# Patient Record
Sex: Female | Born: 1960 | Race: White | Hispanic: No | Marital: Married | Smoking: Never smoker
Health system: Southern US, Community
[De-identification: ages and names within clinical notes are randomized; demographics above are authoritative.]

## PROBLEM LIST (undated history)

## (undated) DIAGNOSIS — M1990 Unspecified osteoarthritis, unspecified site: Secondary | ICD-10-CM

## (undated) DIAGNOSIS — D649 Anemia, unspecified: Secondary | ICD-10-CM

## (undated) DIAGNOSIS — M1711 Unilateral primary osteoarthritis, right knee: Secondary | ICD-10-CM

## (undated) DIAGNOSIS — R000 Tachycardia, unspecified: Secondary | ICD-10-CM

## (undated) DIAGNOSIS — J45909 Unspecified asthma, uncomplicated: Secondary | ICD-10-CM

## (undated) DIAGNOSIS — E7800 Pure hypercholesterolemia, unspecified: Secondary | ICD-10-CM

## (undated) DIAGNOSIS — E119 Type 2 diabetes mellitus without complications: Secondary | ICD-10-CM

## (undated) DIAGNOSIS — M729 Fibroblastic disorder, unspecified: Secondary | ICD-10-CM

## (undated) DIAGNOSIS — F419 Anxiety disorder, unspecified: Secondary | ICD-10-CM

## (undated) DIAGNOSIS — I700 Atherosclerosis of aorta: Secondary | ICD-10-CM

## (undated) DIAGNOSIS — Z87442 Personal history of urinary calculi: Secondary | ICD-10-CM

## (undated) DIAGNOSIS — G43909 Migraine, unspecified, not intractable, without status migrainosus: Secondary | ICD-10-CM

## (undated) DIAGNOSIS — J189 Pneumonia, unspecified organism: Secondary | ICD-10-CM

## (undated) DIAGNOSIS — I10 Essential (primary) hypertension: Secondary | ICD-10-CM

## (undated) DIAGNOSIS — K219 Gastro-esophageal reflux disease without esophagitis: Secondary | ICD-10-CM

## (undated) HISTORY — DX: Fibroblastic disorder, unspecified: M72.9

---
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
## 2002-11-07 HISTORY — PX: CHOLECYSTECTOMY: SHX55

## 2008-11-07 HISTORY — PX: EXTRACORPOREAL SHOCK WAVE LITHOTRIPSY: SHX1557

## 2009-12-14 ENCOUNTER — Encounter (INDEPENDENT_AMBULATORY_CARE_PROVIDER_SITE_OTHER): Payer: Self-pay | Admitting: Emergency Medicine

## 2009-12-14 ENCOUNTER — Emergency Department (HOSPITAL_COMMUNITY): Admission: EM | Admit: 2009-12-14 | Discharge: 2009-12-14 | Payer: Self-pay | Admitting: Emergency Medicine

## 2009-12-14 ENCOUNTER — Ambulatory Visit: Payer: Self-pay | Admitting: Vascular Surgery

## 2010-10-25 ENCOUNTER — Ambulatory Visit: Payer: Self-pay | Admitting: Family Medicine

## 2010-11-11 ENCOUNTER — Ambulatory Visit: Payer: Self-pay | Admitting: Gastroenterology

## 2011-01-27 LAB — POCT I-STAT, CHEM 8
BUN: 13 mg/dL (ref 6–23)
Creatinine, Ser: 0.6 mg/dL (ref 0.4–1.2)
Glucose, Bld: 162 mg/dL — ABNORMAL HIGH (ref 70–99)
Hemoglobin: 12.9 g/dL (ref 12.0–15.0)
Potassium: 3.9 mEq/L (ref 3.5–5.1)
TCO2: 32 mmol/L (ref 0–100)

## 2011-08-06 ENCOUNTER — Ambulatory Visit: Payer: Self-pay | Admitting: Unknown Physician Specialty

## 2011-11-08 HISTORY — PX: JOINT REPLACEMENT: SHX530

## 2012-02-06 ENCOUNTER — Ambulatory Visit: Payer: Self-pay | Admitting: Unknown Physician Specialty

## 2012-05-20 DIAGNOSIS — E785 Hyperlipidemia, unspecified: Secondary | ICD-10-CM | POA: Insufficient documentation

## 2012-08-23 ENCOUNTER — Ambulatory Visit: Payer: Self-pay | Admitting: Cardiology

## 2012-09-04 ENCOUNTER — Ambulatory Visit: Payer: Self-pay | Admitting: Unknown Physician Specialty

## 2012-09-04 LAB — URINALYSIS, COMPLETE
Bacteria: NONE SEEN
Ketone: NEGATIVE
Leukocyte Esterase: NEGATIVE
Nitrite: NEGATIVE
Specific Gravity: 1.009 (ref 1.003–1.030)
WBC UR: <1 /HPF (ref 0–5)

## 2012-09-04 LAB — APTT: Activated PTT: 27.6 secs (ref 23.6–35.9)

## 2012-09-04 LAB — PROTIME-INR: Prothrombin Time: 13 secs (ref 11.5–14.7)

## 2012-09-10 ENCOUNTER — Inpatient Hospital Stay: Payer: Self-pay | Admitting: Unknown Physician Specialty

## 2012-09-11 DIAGNOSIS — R000 Tachycardia, unspecified: Secondary | ICD-10-CM

## 2012-09-11 LAB — BASIC METABOLIC PANEL
Co2: 24 mmol/L (ref 21–32)
Creatinine: 0.67 mg/dL (ref 0.60–1.30)
EGFR (African American): >60
Potassium: 4.1 mmol/L (ref 3.5–5.1)
Sodium: 135 mmol/L — ABNORMAL LOW (ref 136–145)

## 2012-09-11 LAB — ELECTROLYTE PANEL
Anion Gap: 8 (ref 7–16)
Chloride: 101 mmol/L (ref 98–107)
Potassium: 4.1 mmol/L (ref 3.5–5.1)

## 2012-09-11 LAB — HEMOGLOBIN: HGB: 8.9 g/dL — ABNORMAL LOW (ref 12.0–16.0)

## 2012-09-12 LAB — TSH: Thyroid Stimulating Horm: 0.52 u[IU]/mL

## 2012-09-12 LAB — BASIC METABOLIC PANEL
BUN: 6 mg/dL — ABNORMAL LOW (ref 7–18)
Co2: 27 mmol/L (ref 21–32)
Creatinine: 0.59 mg/dL — ABNORMAL LOW (ref 0.60–1.30)
Osmolality: 271 (ref 275–301)
Sodium: 135 mmol/L — ABNORMAL LOW (ref 136–145)

## 2012-09-12 LAB — HEMOGLOBIN: HGB: 9.4 g/dL — ABNORMAL LOW (ref 12.0–16.0)

## 2012-09-12 LAB — MAGNESIUM: Magnesium: 1.9 mg/dL

## 2012-10-25 DIAGNOSIS — G2581 Restless legs syndrome: Secondary | ICD-10-CM | POA: Insufficient documentation

## 2013-04-02 ENCOUNTER — Ambulatory Visit: Payer: Self-pay | Admitting: Family Medicine

## 2013-11-21 ENCOUNTER — Ambulatory Visit: Payer: Self-pay | Admitting: Gastroenterology

## 2013-11-21 LAB — COMPREHENSIVE METABOLIC PANEL
ANION GAP: 5 — AB (ref 7–16)
AST: 32 U/L (ref 15–37)
Albumin: 3.8 g/dL (ref 3.4–5.0)
Alkaline Phosphatase: 110 U/L
BUN: 11 mg/dL (ref 7–18)
Bilirubin,Total: 0.3 mg/dL (ref 0.2–1.0)
CALCIUM: 9.9 mg/dL (ref 8.5–10.1)
CHLORIDE: 105 mmol/L (ref 98–107)
CO2: 26 mmol/L (ref 21–32)
CREATININE: 0.91 mg/dL (ref 0.60–1.30)
EGFR (African American): >60
Glucose: 145 mg/dL — ABNORMAL HIGH (ref 65–99)
Osmolality: 274 (ref 275–301)
Potassium: 3.6 mmol/L (ref 3.5–5.1)
SGPT (ALT): 45 U/L (ref 12–78)
Sodium: 136 mmol/L (ref 136–145)
Total Protein: 7.8 g/dL (ref 6.4–8.2)

## 2013-11-21 LAB — CBC WITH DIFFERENTIAL/PLATELET
BASOS ABS: 0.1 10*3/uL (ref 0.0–0.1)
BASOS PCT: 1 %
EOS ABS: 0.3 10*3/uL (ref 0.0–0.7)
Eosinophil %: 2.5 %
HCT: 44.1 % (ref 35.0–47.0)
HGB: 14.6 g/dL (ref 12.0–16.0)
LYMPHS PCT: 27.3 %
Lymphocyte #: 3.1 10*3/uL (ref 1.0–3.6)
MCH: 27.2 pg (ref 26.0–34.0)
MCHC: 33.2 g/dL (ref 32.0–36.0)
MCV: 82 fL (ref 80–100)
MONO ABS: 0.7 x10 3/mm (ref 0.2–0.9)
MONOS PCT: 6.4 %
NEUTROS ABS: 7.2 10*3/uL — AB (ref 1.4–6.5)
NEUTROS PCT: 62.8 %
Platelet: 411 10*3/uL (ref 150–440)
RBC: 5.37 10*6/uL — AB (ref 3.80–5.20)
RDW: 13.8 % (ref 11.5–14.5)
WBC: 11.5 10*3/uL — AB (ref 3.6–11.0)

## 2013-11-21 LAB — TROPONIN I: Troponin-I: <0.02 ng/mL

## 2014-06-03 ENCOUNTER — Ambulatory Visit: Payer: Self-pay | Admitting: Nurse Practitioner

## 2015-02-19 DIAGNOSIS — M1711 Unilateral primary osteoarthritis, right knee: Secondary | ICD-10-CM | POA: Insufficient documentation

## 2015-02-24 NOTE — Op Note (Signed)
PATIENT NAME:  Andrea Zhang, Andrea Zhang MR#:  [id number] DATE OF BIRTH:  [birth year]  DATE OF PROCEDURE:  09/10/2012  PREOPERATIVE DIAGNOSIS: Severe degenerative arthritis, left knee.   POSTOPERATIVE DIAGNOSIS: Severe degenerative arthritis, left knee.   OPERATION: [employer] PS total knee replacement on the left.   SURGEON: Alda Berthold., MD   FIRST ASSISTANT: Glenford Bayley, nurse practitioner   ANESTHESIA: General.   HISTORY: The patient had a long history of symptomatic degenerative arthritis of her left knee. She did have multiple weightbearing views plus MRI to determine if her lateral compartment was as involved as her medial. Indeed it was felt that the patient had tricompartmental pathology and was not a candidate for a unicondylar knee but rather a candidate for total knee replacement. Ultimately she was brought in for surgery because of her refractoriness to conservative treatment.   OPERATIVE NOTE: The patient was taken to the operating room where satisfactory general anesthesia was achieved. A tourniquet was applied to the patient's left upper thigh and the left lower extremity was prepped and draped in the usual fashion for a procedure about the knee. The patient incidentally was given 1 gram of vancomycin IV prior to the start of the procedure. The left lower extremity was exsanguinated and the tourniquet was inflated. A slightly curved longitudinal incision was made over the anterior aspect of the left knee joint. Dissection was carried down through the subcutaneous tissue onto the extensor mechanism. Bleeding was controlled with coagulation cautery. The vastus medialis was divided in line with its fibers about a fingerbreadth above the superomedial pole of the patella. The joint was entered. Dissection was carried along the medial border of the patella and patellar tendon. The patella was everted and dislocated laterally as the knee was flexed.   Inspection of the joint revealed the  patient had osteophytes about the periphery of the distal femur and in the intercondylar notch. There were some small osteophytes about the periphery of the tibial plateau.   I went ahead and excised the osteophytes from the periphery of the distal femur and tibia and from the intercondylar notch. A 3/8-inch hole was made in the distal femur about a centimeter anterior to the PCL insertion on the distal femur. Intramedullary rod was inserted. On the distal end of the rod was an alignment jig and cutting block. After the alignment jig was appropriately positioned, the cutting block was fixed to the distal femur with smooth pins. The intramedullary rod was removed and then a distal femoral cut was made. I then templated the distal femur for a #7 femoral component.   Starting holes for the femoral cut were made with a #8 block so as not to notch the anterior aspect of the distal femur. The holes were placed in slight external rotation. The #7 cutting block was then impacted into the holes and then our anterior and posterior cuts were made along with our anterior and posterior chamfer cuts. The intercondylar area was notched out using the appropriate jig. The ACL and PCL were removed at this time. The knee was hyperflexed. Meniscal remnants were excised. A 3/8-inch intramedullary hole was made in the midline of the proximal tibial junction in the anterior one-third and posterior two thirds. Intramedullary rod was inserted. On the proximal end of the rod was a 0 degree cutting block. It was positioned for a cut 2 mm below the lowest portion of the medial tibial plateau. The cutting block was aligned and affixed to the proximal tibia with  smooth pins and then the intramedullary rod and stylus were removed. Proximal tibia cut was made without difficulty.   I templated the proximal tibia for a #6 tibial baseplate.   The trials were inserted. We used a baseplate with a 10 mm spacer and a #7 femoral component. With  the components in place, the knee moved well and was quite stable. I was able to achieve full extension of the left knee.   At this time the tourniquet was released. It had been up about 70 minutes. While the tourniquet was down, I went ahead and made the retropatellar cut. I removed about 8 mm of bone from the retropatellar surface. I templated the retropatellar surface for a #7 10-mm thick resurfacing patellar component.   With the trial in place, the patella seemed to track well.   The left lower extremity was re-exsanguinated and the tourniquet was reinflated. The tourniquet was down about 12 to 13 minutes prior to reinflation. I went ahead and removed the trial components. I did use the tibial baseplate as a guide for our cruciate cut in the proximal tibia. The cruciate cut was made without difficulty. Jet lavage was then used to remove blood from the cut cancellous bone.    Next, I went ahead and impacted the primary components. The #6 standard tibial tray was impacted onto the proximal tibia with methylmethacrylate. Excess glue was removed. The #7 PS femoral component was impacted onto the distal femur with methylmethacrylate and again excess glue was removed.   A 10 mm #5 trial tibial bearing insert was impacted on the tibial tray and then the knee was extended. The #7 10-mm thick resurfacing patellar component was glued to the retropatellar surface. Once again excess glue was removed.   After the glue had set up, I went ahead and removed the trial 10 mm tibial bearing insert and impacted the permanent #5 10-mm posterior stabilized tibial bearing insert onto the tibial tray.   After the glue had set up, the tourniquet was released. Total tourniquet time was 122 minutes.   Again, bleeding was controlled with coagulation cautery. Two Autovac tubes were inserted into the depths of the wound.   I then infiltrated the wound with 30 mL of 0.5% Marcaine without epinephrine and 50 mL of diluted  Exparel, a  long-acting anesthetic.    I then closed the capsule with #1 Ethibond and #1 Vicryl sutures. Sub-Q was closed with 2-0 Vicryl and the skin with skin staples. Four TENS pads were placed about the knee followed by a sterile dressing. Polar Care cooling pad was applied around the left knee followed by a knee immobilizer.   The patient was then awakened and transferred to her hospital bed.   She was taken to the recovery room in satisfactory condition. Blood loss was probably less than 50 mL.   SUMMARY OF COMPONENTS USED: #7 PS femoral component, #6 standard tibial tray, #5 10-mm thick PS tibial bearing insert, and a #7 10-mm thick resurfacing patellar component.   We did use methylmethacrylate impregnated with tobramycin.  ____________________________ Alda Berthold., MD hbk:drc D: 09/10/2012 15:01:53 ET T: 09/10/2012 17:46:00 ET JOB#: [id number] Alda Berthold MD ELECTRONICALLY SIGNED 09/12/2012 11:06

## 2015-02-24 NOTE — Consult Note (Signed)
PATIENT NAME:  Andrea Zhang, Andrea Zhang MR#:  [id number] DATE OF BIRTH:  05/03/1961  DATE OF CONSULTATION:  09/11/2012  REFERRING PHYSICIAN:  Erin Sons, Montez Hageman., MD  CONSULTING PHYSICIAN:  Blaike Newburn H. Allena Katz, MD  PRIMARY CARE PROVIDER: Forest Gleason, MD    REASON FOR CONSULTATION: Tachycardia.   HISTORY OF PRESENT ILLNESS: The patient is a 54 year old white female who underwent a left total knee replacement on November 4th. The patient was noted to have elevated heart rate which started on November 4th a few hours after surgery. The patient reports that she is having still a lot of pain in her knee and feels likely cause of her symptoms. Her hemoglobin was 8.9 when checked earlier. She otherwise denies any chest pain, shortness of breath, or palpitations. She was able to ambulate without any dizziness or syncope. The patient reports that she did have a recent cardiac cath after having a stress test which just showed some mild plaque and irregularities. No significant other abnormalities were noted. The patient otherwise is doing well and denies any fevers or chills.   1. PAST MEDICAL HISTORY:  2. 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  3. Osteoarthritis.  4. Diabetes, type II.  5. History of elevated LFTs.  6. History of diverticulitis and diverticulosis. 7. History of rhinitis.  8. History of polio as a child.  9. Hypertension.  10. Hyperlipidemia.  11. History of migraines.   PAST SURGICAL HISTORY:  1. Status post lithotripsy for kidney stones.  2. Status post left total knee replacement.  3. Status post tonsillectomy.  4. Status post cholecystectomy.  5. Status post left knee meniscus repair.  6. History of Baker's cyst repair surgery.  7. Bilateral tubal ligation.   ALLERGIES: Cephalexin, contrast dye, diclofenac, Maxalt, Zithromax, and Zomig.   HOME MEDICATIONS:  1. Advair 250/50 one inhalation b.i.d.  2. Aspirin 81 one tab p.o. daily.  3. Astepro 205.5 mcg  inhalation two sprays each nostril daily.  4. Benicar 20 daily.  5. Mirtazapine 30 at bedtime.  6. Simvastatin 40 one tab p.o. b.i.d.  7. Promethazine 25 q.6 p.r.n.  8. ProAir 2 puffs q.6 p.r.n.  9. Singulair 10 daily.  10. Sumatriptan 100 one tab p.o. b.i.d. as needed.  11. Xyzal 5 mg daily in the evening.   SOCIAL HISTORY: Does not smoke. Does not drink. No drugs.   FAMILY HISTORY: Positive for hypertension.  REVIEW OF SYSTEMS: CONSTITUTIONAL: Denies any fevers, chills. No weight gain. No weight loss. HEENT: Denies any visual difficulty. No blurred vision. No cataracts. No glaucoma. No difficulty with swelling. Denies any epistaxis. Does have chronic rhinitis. Denies any difficulty with hearing. CARDIOVASCULAR: Denies any chest pains or palpitations. No syncope. Recent cardiac cath according to her showed just some plaque but no significant coronary artery disease. PULMONARY: Denies any wheezing. No coughing. No pneumonia. No TB. GI: Complains of some nausea but no vomiting. No diarrhea. No hematemesis. No hematochezia. GU: Denies any dysuria, frequency, urgency, or hesitancy. NEUROLOGIC: Denies any numbness, CVA, TIA, or seizures. PSYCHIATRIC: No anxiety. HEME: Denies any easy bruisability.   PHYSICAL EXAMINATION:   VITAL SIGNS: Temperature 98.6, pulse 118, respirations 18, blood pressure 111/78, O2 96%.   GENERAL: The patient is an obese female currently not in any acute distress.   HEENT: Head atraumatic, normocephalic. Pupils equally round, reactive to light and accommodation. There is no conjunctival pallor. No scleral icterus. There is no nasal congestion. No drainage. Oropharynx is clear without any exudate.  NECK: No thyromegaly. No carotid bruits.   CARDIOVASCULAR: Tachycardic. No murmurs, rubs, clicks, or gallops.   LUNGS: Clear to auscultation bilaterally without any rales, rhonchi, or wheezing.   ABDOMEN: Soft, nontender, nondistended. Positive bowel sounds x4.    EXTREMITIES: There is no clubbing, cyanosis, or edema.   LYMPHATICS: No lymph nodes palpable.   SKIN: No rash.   VASCULAR: Good DP, PT pulses.   PSYCHIATRIC: Not anxious or depressed.   NEUROLOGICAL: Awake, alert, oriented x3. No focal deficits.   EVALUATIONS: Glucose 162, BUN 5, creatinine 0.67, sodium 135, potassium 4.1, chloride 102, CO2 24. Hemoglobin was 8.9 this morning.   ASSESSMENT AND PLAN: The patient is a 54 year old white female with diabetes, hypertension, hyperlipidemia, recent cardiac cath which was negative, status post left total knee replacement. The patient noted to have elevated heart rate since yesterday. The patient denies any chest pain, shortness of breath, or palpitations.  1. Likely sinus tachycardia, likely cause is due to pain. At this time I will go ahead and check an EKG. Place her on telemetry. Her hemoglobin is okay. Electrolytes look all okay. At this time will try to control her pain. Will place her on low dose metoprolol withholding parameters. Will also check a TSH and a magnesium level.  2. Hypertension. Hold Benicar. Will give instead metoprolol b.i.d.  3. Diabetes. Continue sliding scale.  4. Chronic rhinitis. Continue cetirizine. The patient is also on Astepro which will go ahead and resume.  5. Miscellaneous. Recommend incentive spirometry and DVT prophylaxis.   TIME SPENT: 35 minutes.   ____________________________ Lacie Scotts Allena Katz, MD shp:drc D: 09/11/2012 15:27:15 ET T: 09/11/2012 17:12:54 ET JOB#: [id number]  cc: Lavena Loretto H. Allena Katz, MD, <Dictator> Santiago Bur. Shelva Majestic, MD Charise Carwin MD ELECTRONICALLY SIGNED 09/11/2012 17:30

## 2015-02-24 NOTE — Discharge Summary (Signed)
PATIENT NAME:  Andrea Zhang, Andrea Zhang MR#:  [id number] DATE OF BIRTH:  December 15, 1960  DATE OF ADMISSION:  09/10/2012 DATE OF DISCHARGE:  09/13/2012  ADMITTING DIAGNOSIS:  Left knee osteoarthritis.  DISCHARGE DIAGNOSIS:  Left knee osteoarthritis.  OPERATION:  On 09/11/2012, left total knee arthroplasty.  SURGEON:  Dr. Elfredia Nevins, Montez Hageman.  ANESTHESIA:  General.  ESTIMATED BLOOD LOSS: 25 mL.  DRAINS:  Autovac.  COMPLICATIONS:  None.  IMPLANTS:  #7 PDS femoral component, #6 standard tibial tray, #5 10-ml thick PS tibial bearing insert, #7 10-mL thick resurfacing patellar component.  The patient was stabilized and brought to the recovery room and then brought to the orthopedic floor where she was treated with physical therapy and for pain control.  HISTORY:  Andrea Zhang is a 54 year old white female with arthritis of the left knee and has agreed to proceed with total knee.  She had previous steroid injection and Synvisc, neither of which were helpful.  She had a previous left knee arthroscopy and has continued to have pain.  PHYSICAL EXAMINATION:  Well developed, well nourished and obese female.  No acute distress. HEART: Regular rate and rhythm.  LUNGS: Clear to auscultation throughout bilaterally.  MUSCULOSKELETAL:  Preoperative left knee range of motion 0-120, painful flexion.  Slightly antalgic gait.  No gross deformity.  Tender to palpation on the medial and lateral joint lines.  Ligamentous exam is stable.  Negative McMurray.  SKIN: Incision consistent with total knee arthroscopy.  Has staples intact and dry dressing with a scant amount of serosanguineous drainage.    HOSPITAL COURSE:  After admission on 09/11/2012, she had surgery on the same day.  She had good pain control afterwards and was brought to the orthopedic floor from the recovery room.  On postoperative day one she complained of itching.  She was also tachycardic, 118-120. Medicine was consulted and she was placed on telemetry with the  recommendation of holding her Benicar and starting her on metoprolol.  She had a hemoglobin of 8.9.  She ambulated 80 feet with physical therapy that day. On postoperative day two, 09/12/2012, her hemoglobin was 9.4.  Her Foley and Hemovac were discontinued and her dressing was changed.  She continued to progress with physical therapy.  On postoperative day three, 09/13/2012, she ambulated with physical therapy 235 feet and was on telemetry.  She had a bowel movement before being discharged and was discharged home with Home Health.  CONDITION ON DISCHARGE:  Stable.  DISPOSITION:  She was sent home with Home Health.  DISCHARGE INSTRUCTIONS:   She will follow up with [facility] orthopedics in two weeks for staple removal.  She will have physical therapy at home and will be weightbearing as tolerated in the left lower extremity.  TED hose will be used knee-high bilaterally.  Dressing can be changed daily on an as-needed basis.  DISCHARGE MEDICATIONS:  She will resume her home medications: 1. Singulair 10 mg, 1 tab p.o. daily in the evening. 2. Mirtazapine 30 mg, 1 tab p.o. once daily at bedtime. 3. Aspirin 81 mg, 1 tab p.o. q. a.m. 4. Astepro 200 5.5 mcg per inhalation (0.15%) nasal spray, 2 sprays each nostril once daily q. a.m. 5. Benicar 20 mg 1 tab p.o. q. a.m. 6. Horizant 600 mg p.o. extended release, 1 tab p.o. at bedtime. 7. Nexium 40 mg delayed release capsule, 1 cap b.i.d. 8. ProAir HFA 2 puffs inhaled q. 6 hours p.r.n. 9. Sumatriptan 100 mg, 1 tab p.o. b.i.d., p.r.n. headache. 10. Promethazine 25 mg,  1 tab p.o. q. 6 hours p.r.n. headache, nausea, or vomiting. 11. Xyzal 5 mg, 1 tab p.o. at bedtime. 12. Advair Diskus 250 mcg/50 mcg inhalation powder, 1 puff b.i.d.   13. Additional medicines include Lovenox 50 mg subq daily times 14 days.    ____________________________ Niyonna Betsill M. Haskel Khan, NP amb:bjt D: 09/19/2012 14:00:08 ET T: 09/19/2012 14:26:26 ET JOB#: [id number]  cc: Tylah Mancillas M.  Haskel Khan, NP, <Dictator> Burt Ek Faelynn Wynder FNP ELECTRONICALLY SIGNED 09/27/2012 7:11

## 2015-03-01 NOTE — Op Note (Signed)
PATIENT NAME:  Andrea Zhang, Andrea Zhang MR#:  [id number] DATE OF BIRTH:  09/01/1961  DATE OF PROCEDURE:  02/06/2012  PREOPERATIVE DIAGNOSIS: Degenerative arthritis, left knee, primarily involving the medial compartment plus remote ACL tear.   POSTOPERATIVE DIAGNOSIS: Degenerative arthritis, left knee, with significant patellofemoral medial compartment involvement and mild lateral compartment involvement plus remote ACL tear.  OPERATION: Arthroscopic debridement and coblation of multiple chondral lesions.   SURGEON: Alda BertholdHarold B. Judson Tsan, Jr., MD    ANESTHESIA: General.   HISTORY: The patient had a long history of left knee pain. X-rays revealed collapse of medial compartment. She was noted to have some osteophytes about the lateral aspect of her left knee and there were some patellofemoral changes. MRI was consistent with the above plus mild arthritis of the lateral compartment along with absent ACL.   The patient was ultimately brought in for arthroscopy of her left knee because of persistent symptoms.  PROCEDURE: The patient was taken to the operating room where satisfactory general anesthesia was achieved. A tourniquet was applied to her left thigh followed by legholder. The right lower extremity was supported with a well legholder. The left knee was prepped and draped in the usual fashion for an arthroscopic procedure. An Inflow cannula was introduced superomedially. The joint was distended with lactated Ringer's. We used a Mitek fluid pump to facilitate joint distention.  Scope was introduced through an inferolateral puncture wound and a probe in an inferomedial puncture wound. Inspection of the medial compartment revealed the patient had eroded the medial aspect of the medial tibial plateau to bone. There was a corresponding grade IV chondral lesion in the medial aspect of the medial femoral condyle. The patient had had a remote partial medial meniscectomy.   Inspection of the intercondylar notch  revealed a notch that was filled with fibrous tissue. I could not find a distinct ACL or PCL. There were osteophytes in the intercondylar area.  Inspection of the lateral compartment revealed the patient had fraying of the articular surface of the medial aspect of the medial femoral condyle and medial aspect of the tibial plateau. There was some mild central wear of the more anterior aspect of the lateral femoral condyle.   The lateral meniscus was frayed on its inner border. I went ahead and debrided a frayed portion with  a turbo whisker. I then inspected the patellofemoral area. Indeed the patient had grade III chondral changes in her trochlear groove and retropatellar surface. I debrided these areas with a turbo whisker and then Coblated the lesions with an [employer] paragon wand.   I did observe patella tracking along the superomedial portion and indeed it seemed to track normally.   I decompressed the fluid from the joint at this time and removed the cannulas. The puncture wounds were closed with 3-0 nylon in vertical mattress fashion. Several mL of 0.5% Marcaine without epinephrine was injected about each puncture wound and then Betadine was applied to the wounds followed by a sterile dressing.   An ice pack was applied around the patient's left knee and then she was awakened and transferred to her stretcher bed. She was taken to the recovery room in satisfactory condition. Blood loss was negligible.   Incidentally, the tourniquet was not inflated during the course of the procedure.   ____________________________ Alda BertholdHarold B. Darren Caldron Jr., MD hbk:drc D: 02/06/2012 10:14:48 ET T: 02/06/2012 11:38:48 ET JOB#: [id number]  cc: Alda BertholdHarold B. Sumi Lye Jr., MD, <Dictator> Randon GoldsmithHAROLD B Kavari Parrillo, JR MD ELECTRONICALLY SIGNED 02/26/2012 21:29

## 2015-06-30 ENCOUNTER — Other Ambulatory Visit: Payer: Self-pay | Admitting: Nurse Practitioner

## 2015-06-30 DIAGNOSIS — Z1231 Encounter for screening mammogram for malignant neoplasm of breast: Secondary | ICD-10-CM

## 2015-08-12 ENCOUNTER — Ambulatory Visit
Admission: RE | Admit: 2015-08-12 | Discharge: 2015-08-12 | Disposition: A | Discharge status: Home / Self Care | Payer: BLUE CROSS/BLUE SHIELD | Source: Ambulatory Visit | Attending: Nurse Practitioner | Admitting: Nurse Practitioner

## 2015-08-12 DIAGNOSIS — Z1231 Encounter for screening mammogram for malignant neoplasm of breast: Secondary | ICD-10-CM | POA: Insufficient documentation

## 2015-12-15 ENCOUNTER — Other Ambulatory Visit: Payer: Self-pay | Admitting: Rheumatology

## 2015-12-15 DIAGNOSIS — R7989 Other specified abnormal findings of blood chemistry: Secondary | ICD-10-CM

## 2015-12-15 DIAGNOSIS — R945 Abnormal results of liver function studies: Principal | ICD-10-CM

## 2015-12-18 ENCOUNTER — Ambulatory Visit: Payer: BLUE CROSS/BLUE SHIELD

## 2015-12-21 ENCOUNTER — Ambulatory Visit: Payer: BLUE CROSS/BLUE SHIELD

## 2015-12-24 ENCOUNTER — Ambulatory Visit
Admission: RE | Admit: 2015-12-24 | Discharge: 2015-12-24 | Disposition: A | Discharge status: Home / Self Care | Payer: BLUE CROSS/BLUE SHIELD | Source: Ambulatory Visit | Attending: Rheumatology | Admitting: Rheumatology

## 2015-12-24 DIAGNOSIS — Z9049 Acquired absence of other specified parts of digestive tract: Secondary | ICD-10-CM | POA: Insufficient documentation

## 2015-12-24 DIAGNOSIS — R945 Abnormal results of liver function studies: Secondary | ICD-10-CM | POA: Insufficient documentation

## 2015-12-24 DIAGNOSIS — K760 Fatty (change of) liver, not elsewhere classified: Secondary | ICD-10-CM | POA: Insufficient documentation

## 2015-12-24 DIAGNOSIS — R7989 Other specified abnormal findings of blood chemistry: Secondary | ICD-10-CM

## 2016-05-16 ENCOUNTER — Emergency Department
Admission: EM | Admit: 2016-05-16 | Discharge: 2016-05-16 | Disposition: A | Discharge status: Home / Self Care | Payer: BLUE CROSS/BLUE SHIELD | Attending: Emergency Medicine | Admitting: Emergency Medicine

## 2016-05-16 ENCOUNTER — Encounter: Payer: Self-pay | Admitting: Emergency Medicine

## 2016-05-16 DIAGNOSIS — E119 Type 2 diabetes mellitus without complications: Secondary | ICD-10-CM | POA: Diagnosis not present

## 2016-05-16 DIAGNOSIS — T18120A Food in esophagus causing compression of trachea, initial encounter: Secondary | ICD-10-CM | POA: Diagnosis not present

## 2016-05-16 DIAGNOSIS — Y929 Unspecified place or not applicable: Secondary | ICD-10-CM | POA: Diagnosis not present

## 2016-05-16 DIAGNOSIS — J45909 Unspecified asthma, uncomplicated: Secondary | ICD-10-CM | POA: Diagnosis not present

## 2016-05-16 DIAGNOSIS — I10 Essential (primary) hypertension: Secondary | ICD-10-CM | POA: Insufficient documentation

## 2016-05-16 DIAGNOSIS — M1990 Unspecified osteoarthritis, unspecified site: Secondary | ICD-10-CM | POA: Insufficient documentation

## 2016-05-16 DIAGNOSIS — W458XXA Other foreign body or object entering through skin, initial encounter: Secondary | ICD-10-CM | POA: Diagnosis not present

## 2016-05-16 DIAGNOSIS — Y939 Activity, unspecified: Secondary | ICD-10-CM | POA: Insufficient documentation

## 2016-05-16 DIAGNOSIS — T18128A Food in esophagus causing other injury, initial encounter: Secondary | ICD-10-CM

## 2016-05-16 DIAGNOSIS — Y999 Unspecified external cause status: Secondary | ICD-10-CM | POA: Diagnosis not present

## 2016-05-16 DIAGNOSIS — K222 Esophageal obstruction: Secondary | ICD-10-CM

## 2016-05-16 HISTORY — DX: Unspecified asthma, uncomplicated: J45.909

## 2016-05-16 HISTORY — DX: Migraine, unspecified, not intractable, without status migrainosus: G43.909

## 2016-05-16 HISTORY — DX: Type 2 diabetes mellitus without complications: E11.9

## 2016-05-16 HISTORY — DX: Unspecified osteoarthritis, unspecified site: M19.90

## 2016-05-16 HISTORY — DX: Gastro-esophageal reflux disease without esophagitis: K21.9

## 2016-05-16 HISTORY — DX: Essential (primary) hypertension: I10

## 2016-05-16 NOTE — ED Notes (Signed)
States approx 30 minutes ago got something stuck when swallowed food. Thinks it is a piece of beef. Speaking clearly. Unable to swallow saliva.

## 2016-05-16 NOTE — ED Provider Notes (Signed)
[facility] Emergency Department Provider Note  Time seen: 6:39 PM  I have reviewed the triage vital signs and the nursing notes.   HISTORY  Chief Complaint Foreign Body    HPI Andrea Zhang is a 55 y.o. female with a past medical history of asthma, diabetes, hypertension, gastric reflux who presents to the emergency department feeling that she has food stuck in her esophagus. According to the patient she has had had food removed from her esophagus approximate 5 or 6 times over the past 10 years. States last time she had an esophageal dilation was approximately 2 or 3 years ago. Patient states she was eating a steak sandwich tonight when she felt like something got stuck in her esophagus. She states since that time she has been unable to swallow her saliva. She states this feels like prior food bolus impactions.Patient is speaking without any difficulty, no distress.     Past Medical History  Diagnosis Date  . Arthritis   . Asthma   . Diabetes mellitus without complication (HCC)   . Hypertension   . GERD (gastroesophageal reflux disease)   . Migraines     There are no active problems to display for this patient.   Past Surgical History  Procedure Laterality Date  . Cholecystectomy      No current outpatient prescriptions on file.  Allergies Diclofenac and Other  No family history on file.  Social History Social History  Substance Use Topics  . Smoking status: None  . Smokeless tobacco: None  . Alcohol Use: None    Review of Systems Constitutional: Negative for fever Cardiovascular: Negative for chest pain. Respiratory: Negative for shortness of breath. Gastrointestinal: Negative for abdominal pain Musculoskeletal: Negative for back pain. Neurological: Negative for headache 10-point ROS otherwise negative.  ____________________________________________   PHYSICAL EXAM:  VITAL SIGNS: ED Triage Vitals  Enc Vitals Group     BP  05/16/16 1816 146/101 mmHg     Pulse Rate 05/16/16 1816 90     Resp 05/16/16 1816 18     Temp 05/16/16 1816 98.3 F (36.8 C)     Temp Source 05/16/16 1816 Oral     SpO2 05/16/16 1816 94 %     Weight 05/16/16 1816 200 lb (90.719 kg)     Height 05/16/16 1816 5\' 1"  (1.549 m)     Head Cir --      Peak Flow --      Pain Score 05/16/16 1818 8     Pain Loc --      Pain Edu? --      Excl. in GC? --     Constitutional: Alert and oriented. Well appearing and in no distress. Eyes: Normal exam ENT   Head: Normocephalic and atraumatic.   Mouth/Throat: Mucous membranes are moist. Cardiovascular: Normal rate, regular rhythm. No murmur Respiratory: Normal respiratory effort without tachypnea nor retractions. Breath sounds are clear Gastrointestinal: Soft and nontender. No distention. Musculoskeletal: Ambulates without difficulty. Neurologic:  Normal speech and language. No gross focal neurologic deficits  Skin:  Skin is warm, dry and intact.  Psychiatric: Mood and affect are normal.  ____________________________________________   INITIAL IMPRESSION / ASSESSMENT AND PLAN / ED COURSE  Pertinent labs & imaging results that were available during my care of the patient were reviewed by me and considered in my medical decision making (see chart for details).  The patient presents the emergency department with a possible food bolus impaction. Patient states she is eating a steak  sandwich approximate 5:45 PM when she felt the food gets stuck. She has not been able to swallow secretions since that time. However while waiting in the emergency department the patient vomited, now states that she feels like she is able to swallow her secretions. We will provide the patient with a carbonated beverage, if she is able to swallow we will have her follow-up with Dr. Mechele Collin as an outpatient. If not will have Dr. Mechele Collin see patient in the emergency department.  Dr. Mechele Collin is in the emergency department  currently seeing the patient, poorly the patient had called Dr. Mechele Collin before arriving to the emergency department and he was aware of the patient's arrival.  Patient is able to swallow, no beverage in the emergency department. Patient will follow up with Dr. Mechele Collin in his office tomorrow. Patient agreeable to plan.  ____________________________________________   FINAL CLINICAL IMPRESSION(S) / ED DIAGNOSES  Esophageal food bolus impaction   Minna Antis, MD 05/16/16 [phone]

## 2016-05-16 NOTE — Discharge Instructions (Signed)
Esophagogastroduodenoscopy °Esophagogastroduodenoscopy (EGD) is a procedure that is used to examine the lining of the esophagus, stomach, and first part of the small intestine (duodenum). A long, flexible, lighted tube with a camera attached (endoscope) is inserted down the throat to view these organs. This procedure is done to detect problems or abnormalities, such as inflammation, bleeding, ulcers, or growths, in order to treat them. The procedure lasts 5-20 minutes. It is usually an outpatient procedure, but it may need to be performed in a hospital in emergency cases. °LET YOUR HEALTH CARE PROVIDER KNOW ABOUT: °· Any allergies you have. °· All medicines you are taking, including vitamins, herbs, eye drops, creams, and over-the-counter medicines. °· Previous problems you or members of your family have had with the use of anesthetics. °· Any blood disorders you have. °· Previous surgeries you have had. °· Medical conditions you have. °RISKS AND COMPLICATIONS °Generally, this is a safe procedure. However, problems can occur and include: °· Infection. °· Bleeding. °· Tearing (perforation) of the esophagus, stomach, or duodenum. °· Difficulty breathing or not being able to breathe. °· Excessive sweating. °· Spasms of the larynx. °· Slowed heartbeat. °· Low blood pressure. °BEFORE THE PROCEDURE °· Do not eat or drink anything after midnight on the night before the procedure or as directed by your health care provider. °· Do not take your regular medicines before the procedure if your health care provider asks you not to. Ask your health care provider about changing or stopping those medicines. °· If you wear dentures, be prepared to remove them before the procedure. °· Arrange for someone to drive you home after the procedure. °PROCEDURE °· A numbing medicine (local anesthetic) may be sprayed in your throat for comfort and to stop you from gagging or coughing. °· You will have an IV tube inserted in a vein in your  hand or arm. You will receive medicines and fluids through this tube. °· You will be given a medicine to relax you (sedative). °· A pain reliever will be given through the IV tube. °· A mouth guard may be placed in your mouth to protect your teeth and to keep you from biting on the endoscope. °· You will be asked to lie on your left side. °· The endoscope will be inserted down your throat and into your esophagus, stomach, and duodenum. °· Air will be put through the endoscope to allow your health care provider to clearly view the lining of your esophagus. °· The lining of your esophagus, stomach, and duodenum will be examined. During the exam, your health care provider may: °¨ Remove tissue to be examined under a microscope (biopsy) for inflammation, infection, or other medical problems. °¨ Remove growths. °¨ Remove objects (foreign bodies) that are stuck. °¨ Treat any bleeding with medicines or other devices that stop tissues from bleeding (hot cautery, clipping devices). °¨ Widen (dilate) or stretch narrowed areas of your esophagus and stomach. °· The endoscope will be withdrawn. °AFTER THE PROCEDURE °· You will be taken to a recovery area for observation. Your blood pressure, heart rate, breathing rate, and blood oxygen level will be monitored often until the medicines you were given have worn off. °· Do not eat or drink anything until the numbing medicine has worn off and your gag reflex has returned. You may choke. °· Your health care provider should be able to discuss his or her findings with you. It will take longer to discuss the test results if any biopsies were taken. °  °  This information is not intended to replace advice given to you by your health care provider. Make sure you discuss any questions you have with your health care provider. °  °Document Released: 02/24/2005 Document Revised: 11/14/2014 Document Reviewed: 09/26/2012 °Elsevier Interactive Patient Education ©2016 [employer] ° °

## 2016-06-03 ENCOUNTER — Encounter: Payer: Self-pay | Admitting: *Deleted

## 2016-06-06 ENCOUNTER — Ambulatory Visit: Payer: BLUE CROSS/BLUE SHIELD | Admitting: Anesthesiology

## 2016-06-06 ENCOUNTER — Encounter: Payer: Self-pay | Admitting: *Deleted

## 2016-06-06 ENCOUNTER — Encounter
Admission: RE | Disposition: A | Discharge status: Home / Self Care | Payer: Self-pay | Source: Ambulatory Visit | Attending: Unknown Physician Specialty

## 2016-06-06 ENCOUNTER — Ambulatory Visit
Admission: RE | Admit: 2016-06-06 | Discharge: 2016-06-06 | Disposition: A | Discharge status: Home / Self Care | Payer: BLUE CROSS/BLUE SHIELD | Source: Ambulatory Visit | Attending: Unknown Physician Specialty | Admitting: Unknown Physician Specialty

## 2016-06-06 DIAGNOSIS — M1990 Unspecified osteoarthritis, unspecified site: Secondary | ICD-10-CM | POA: Diagnosis not present

## 2016-06-06 DIAGNOSIS — R1310 Dysphagia, unspecified: Secondary | ICD-10-CM | POA: Diagnosis present

## 2016-06-06 DIAGNOSIS — E7800 Pure hypercholesterolemia, unspecified: Secondary | ICD-10-CM | POA: Insufficient documentation

## 2016-06-06 DIAGNOSIS — Z7982 Long term (current) use of aspirin: Secondary | ICD-10-CM | POA: Diagnosis not present

## 2016-06-06 DIAGNOSIS — K449 Diaphragmatic hernia without obstruction or gangrene: Secondary | ICD-10-CM | POA: Diagnosis not present

## 2016-06-06 DIAGNOSIS — E119 Type 2 diabetes mellitus without complications: Secondary | ICD-10-CM | POA: Insufficient documentation

## 2016-06-06 DIAGNOSIS — Z96652 Presence of left artificial knee joint: Secondary | ICD-10-CM | POA: Insufficient documentation

## 2016-06-06 DIAGNOSIS — I10 Essential (primary) hypertension: Secondary | ICD-10-CM | POA: Diagnosis not present

## 2016-06-06 DIAGNOSIS — G43909 Migraine, unspecified, not intractable, without status migrainosus: Secondary | ICD-10-CM | POA: Diagnosis not present

## 2016-06-06 DIAGNOSIS — Z79899 Other long term (current) drug therapy: Secondary | ICD-10-CM | POA: Diagnosis not present

## 2016-06-06 DIAGNOSIS — K222 Esophageal obstruction: Secondary | ICD-10-CM | POA: Insufficient documentation

## 2016-06-06 DIAGNOSIS — K219 Gastro-esophageal reflux disease without esophagitis: Secondary | ICD-10-CM | POA: Insufficient documentation

## 2016-06-06 DIAGNOSIS — J45909 Unspecified asthma, uncomplicated: Secondary | ICD-10-CM | POA: Insufficient documentation

## 2016-06-06 DIAGNOSIS — K2950 Unspecified chronic gastritis without bleeding: Secondary | ICD-10-CM | POA: Insufficient documentation

## 2016-06-06 DIAGNOSIS — Z7984 Long term (current) use of oral hypoglycemic drugs: Secondary | ICD-10-CM | POA: Insufficient documentation

## 2016-06-06 HISTORY — DX: Pure hypercholesterolemia, unspecified: E78.00

## 2016-06-06 HISTORY — PX: ESOPHAGOGASTRODUODENOSCOPY (EGD) WITH PROPOFOL: SHX5813

## 2016-06-06 HISTORY — PX: SAVORY DILATION: SHX5439

## 2016-06-06 LAB — GLUCOSE, CAPILLARY: GLUCOSE-CAPILLARY: 163 mg/dL — AB (ref 65–99)

## 2016-06-06 SURGERY — ESOPHAGOGASTRODUODENOSCOPY (EGD) WITH PROPOFOL
Anesthesia: General

## 2016-06-06 MED ORDER — BUTAMBEN-TETRACAINE-BENZOCAINE 2-2-14 % EX AERO
INHALATION_SPRAY | CUTANEOUS | Status: DC | PRN
Start: 2016-06-06 — End: 2016-06-06
  Administered 2016-06-06: 1 via TOPICAL

## 2016-06-06 MED ORDER — MIDAZOLAM HCL 5 MG/5ML IJ SOLN
INTRAMUSCULAR | Status: DC | PRN
  Administered 2016-06-06: 1 mg via INTRAVENOUS

## 2016-06-06 MED ORDER — LIDOCAINE HCL (PF) 2 % IJ SOLN
INTRAMUSCULAR | Status: DC | PRN
  Administered 2016-06-06: 50 mg

## 2016-06-06 MED ORDER — SODIUM CHLORIDE 0.9 % IV SOLN: INTRAVENOUS | Status: DC

## 2016-06-06 MED ORDER — PROPOFOL 10 MG/ML IV BOLUS
INTRAVENOUS | Status: DC | PRN
  Administered 2016-06-06: 50 mg via INTRAVENOUS

## 2016-06-06 MED ORDER — SODIUM CHLORIDE 0.9 % IV SOLN
INTRAVENOUS | Status: DC
  Administered 2016-06-06: 08:00:00 via INTRAVENOUS

## 2016-06-06 MED ORDER — PIPERACILLIN-TAZOBACTAM 3.375 G IVPB 30 MIN
3.3750 g | Freq: Once | INTRAVENOUS | Status: AC
  Administered 2016-06-06: 3.375 g via INTRAVENOUS
  Filled 2016-06-06: qty 50

## 2016-06-06 MED ORDER — PROPOFOL 500 MG/50ML IV EMUL
INTRAVENOUS | Status: DC | PRN
  Administered 2016-06-06: 100 ug/kg/min via INTRAVENOUS

## 2016-06-06 MED ORDER — GLYCOPYRROLATE 0.2 MG/ML IJ SOLN
INTRAMUSCULAR | Status: DC | PRN
  Administered 2016-06-06: 0.2 mg via INTRAVENOUS

## 2016-06-06 MED ORDER — FENTANYL CITRATE (PF) 100 MCG/2ML IJ SOLN
INTRAMUSCULAR | Status: DC | PRN
  Administered 2016-06-06: 50 ug via INTRAVENOUS

## 2016-06-06 MED ORDER — BUTAMBEN-TETRACAINE-BENZOCAINE 2-2-14 % EX AERO
INHALATION_SPRAY | CUTANEOUS | Status: AC
  Filled 2016-06-06: qty 20

## 2016-06-06 NOTE — Anesthesia Postprocedure Evaluation (Signed)
Anesthesia Post Note  Patient: Andrea Zhang  Procedure(s) Performed: Procedure(s) (LRB): ESOPHAGOGASTRODUODENOSCOPY (EGD) WITH PROPOFOL (N/A) SAVORY DILATION (N/A)  Patient location during evaluation: PACU Anesthesia Type: General Level of consciousness: awake Pain management: pain level controlled Vital Signs Assessment: post-procedure vital signs reviewed and stable Respiratory status: spontaneous breathing Cardiovascular status: stable Anesthetic complications: no    Last Vitals:  Vitals:   06/06/16 0749 06/06/16 0839  BP: 117/82 112/76  Pulse: 72 75  Resp: 18 19  Temp: 36.1 C 36.4 C    Last Pain:  Vitals:   06/06/16 0839  TempSrc: Tympanic  PainSc: Asleep                 VAN STAVEREN,Yessenia Maillet

## 2016-06-06 NOTE — Anesthesia Preprocedure Evaluation (Signed)
Anesthesia Evaluation  Patient identified by MRN, date of birth, ID band Patient awake    Reviewed: Allergy & Precautions, NPO status , Patient's Chart, lab work & pertinent test results  Airway Mallampati: III       Dental  (+) Teeth Intact   Pulmonary asthma ,     + decreased breath sounds      Cardiovascular Exercise Tolerance: Good hypertension, Pt. on home beta blockers  Rhythm:Regular     Neuro/Psych    GI/Hepatic GERD  Medicated,  Endo/Other  diabetes, Type 2, Oral Hypoglycemic AgentsMorbid obesity  Renal/GU      Musculoskeletal   Abdominal (+)  Abdomen: soft.    Peds  Hematology   Anesthesia Other Findings   Reproductive/Obstetrics                             Anesthesia Physical Anesthesia Plan  ASA: III  Anesthesia Plan: General   Post-op Pain Management:    Induction: Intravenous  Airway Management Planned: Natural Airway and Nasal Cannula  Additional Equipment:   Intra-op Plan:   Post-operative Plan:   Informed Consent: I have reviewed the patients History and Physical, chart, labs and discussed the procedure including the risks, benefits and alternatives for the proposed anesthesia with the patient or authorized representative who has indicated his/her understanding and acceptance.     Plan Discussed with: CRNA  Anesthesia Plan Comments:         Anesthesia Quick Evaluation

## 2016-06-06 NOTE — Transfer of Care (Signed)
Immediate Anesthesia Transfer of Care Note  Patient: Andrea Zhang  Procedure(s) Performed: Procedure(s): ESOPHAGOGASTRODUODENOSCOPY (EGD) WITH PROPOFOL (N/A) SAVORY DILATION (N/A)  Patient Location: PACU  Anesthesia Type:General  Level of Consciousness: sedated  Airway & Oxygen Therapy: Patient Spontanous Breathing and Patient connected to nasal cannula oxygen  Post-op Assessment: Report given to RN and Post -op Vital signs reviewed and stable  Post vital signs: Reviewed and stable  Last Vitals:  Vitals:   06/06/16 0749  BP: 117/82  Pulse: 72  Resp: 18  Temp: 36.1 C    Last Pain:  Vitals:   06/06/16 0749  TempSrc: Tympanic         Complications: No apparent anesthesia complications

## 2016-06-06 NOTE — Op Note (Signed)
[facility] Gastroenterology Patient Name: Andrea Zhang Procedure Date: 06/06/2016 8:19 AM MRN: [id number] Account #: [id number] Date of Birth: [birth year] Admit Type: Outpatient Age: 54 Room: [facility] ENDO ROOM 4 Gender: Female Note Status: Finalized Procedure:            Upper GI endoscopy Indications:          Dysphagia Providers:            Scot Jun, MD Referring MD:         No Local Md, MD (Referring MD) Medicines:            Propofol per Anesthesia Complications:        No immediate complications. Procedure:            Pre-Anesthesia Assessment:                       - After reviewing the risks and benefits, the patient                        was deemed in satisfactory condition to undergo the                        procedure.                       After obtaining informed consent, the endoscope was                        passed under direct vision. Throughout the procedure,                        the patient's blood pressure, pulse, and oxygen                        saturations were monitored continuously. The Endoscope                        was introduced through the mouth, and advanced to the                        second part of duodenum. The upper GI endoscopy was                        accomplished without difficulty. The patient tolerated                        the procedure well. Findings:      A mild Schatzki ring (acquired) was found at the gastroesophageal       junction. his was at 31cm. there was a 5cm esophageal hiatal hernia. A       guidewire was placed and the scope was withdrawn. Dilation was performed       with a Savary dilator with mild resistance at 17 mm.      Patchy mild inflammation characterized by erythema and granularity was       found in the gastric body and in the gastric antrum. Biopsies were taken       with a cold forceps for histology. Biopsies were taken with a cold       forceps for Helicobacter pylori  testing.      The examined duodenum was normal.  Impression:           - Mild Schatzki ring. Dilated.                       - Gastritis. Biopsied.                       - Normal examined duodenum. Recommendation:       - Await pathology results. Scot Jun, MD 06/06/2016 8:37:35 AM This report has been signed electronically. Number of Addenda: 0 Note Initiated On: 06/06/2016 8:19 AM      [facility]

## 2016-06-06 NOTE — H&P (Signed)
Primary Care Physician:  Myrene Buddy, NP Primary Gastroenterologist:  Dr. Mechele Collin  Pre-Procedure History & Physical: HPI:  Andrea Zhang is a 55 y.o. female is here for an endoscopy.   Past Medical History:  Diagnosis Date  . Arthritis   . Asthma   . Diabetes mellitus without complication (HCC)   . GERD (gastroesophageal reflux disease)   . Hypercholesterolemia   . Hypertension   . Migraines     Past Surgical History:  Procedure Laterality Date  . CHOLECYSTECTOMY    . JOINT REPLACEMENT     LT TKR    Prior to Admission medications   Medication Sig Start Date End Date Taking? Authorizing Provider  albuterol (PROVENTIL HFA;VENTOLIN HFA) 108 (90 Base) MCG/ACT inhaler Inhale 2 puffs into the lungs every 6 (six) hours as needed for wheezing or shortness of breath.   Yes Historical Provider, MD  aspirin EC 81 MG tablet Take 81 mg by mouth daily.   Yes Historical Provider, MD  atenolol (TENORMIN) 50 MG tablet Take 50 mg by mouth daily.   Yes Historical Provider, MD  Cholecalciferol 2000 units CAPS Take 2,000 Units by mouth daily.   Yes Historical Provider, MD  cyclobenzaprine (FLEXERIL) 5 MG tablet Take 5 mg by mouth 3 (three) times daily as needed for muscle spasms.   Yes Historical Provider, MD  esomeprazole (NEXIUM) 40 MG capsule Take 40 mg by mouth daily at 12 noon.   Yes Historical Provider, MD  etodolac (LODINE) 400 MG tablet Take 400 mg by mouth 2 (two) times daily.   Yes Historical Provider, MD  ferrous gluconate (FERGON) 324 MG tablet Take 324 mg by mouth daily with breakfast.   Yes Historical Provider, MD  Fluticasone-Salmeterol (ADVAIR) 250-50 MCG/DOSE AEPB Inhale 1 puff into the lungs 2 (two) times daily.   Yes Historical Provider, MD  Gabapentin Enacarbil 600 MG TBCR Take 1 tablet by mouth every evening.   Yes Historical Provider, MD  loratadine (CLARITIN) 10 MG tablet Take 10 mg by mouth daily.   Yes Historical Provider, MD  metFORMIN (GLUCOPHAGE) 1000 MG  tablet Take 1,000 mg by mouth 2 (two) times daily with a meal.   Yes Historical Provider, MD  mirtazapine (REMERON) 30 MG tablet Take 30 mg by mouth at bedtime.   Yes Historical Provider, MD  naratriptan (AMERGE) 2.5 MG tablet Take 2.5 mg by mouth as needed for migraine. Take one (1) tablet at onset of headache; if returns or does not resolve, may repeat after 4 hours; do not exceed five (5) mg in 24 hours.   Yes Historical Provider, MD  promethazine (PHENERGAN) 25 MG tablet Take 25 mg by mouth every 6 (six) hours as needed for nausea or vomiting.   Yes Historical Provider, MD  simvastatin (ZOCOR) 10 MG tablet Take 10 mg by mouth daily.   Yes Historical Provider, MD    Allergies as of 06/03/2016 - Review Complete 06/03/2016  Allergen Reaction Noted  . Iodinated diagnostic agents Other (See Comments) 06/03/2016  . Cephalexin  06/03/2016  . Maxalt [rizatriptan benzoate]  06/03/2016  . Orphenadrine citrate  06/03/2016  . Zithromax [azithromycin]  06/03/2016  . Zomig [zolmitriptan]  06/03/2016  . Diclofenac Hives 05/16/2016    Family History  Problem Relation Age of Onset  . COPD Mother   . Heart disease Mother   . Asthma Mother   . Schizophrenia Mother   . Hemophilia Father   . Prostate cancer Father   . Heart disease Father   .  Epilepsy Sister   . Stroke Sister   . Alcohol abuse Sister   . Hypertension Sister   . Epilepsy Brother   . Lupus Son   . Gallbladder disease Son     Social History   Social History  . Marital status: Married    Spouse name: N/A  . Number of children: N/A  . Years of education: N/A   Occupational History  . Not on file.   Social History Main Topics  . Smoking status: Never Smoker  . Smokeless tobacco: Never Used  . Alcohol use No  . Drug use: No  . Sexual activity: Not on file   Other Topics Concern  . Not on file   Social History Narrative  . No narrative on file    Review of Systems: See HPI, otherwise negative ROS  Physical  Exam: BP 117/82   Pulse 72   Temp 97 F (36.1 C) (Tympanic)   Resp 18   Ht  (1.549 m)   Wt 90.7 kg (200 lb)   SpO2 97%   BMI 37.79 kg/m  General:   Alert,  pleasant and cooperative in NAD Head:  Normocephalic and atraumatic. Neck:  Supple; no masses or thyromegaly. Lungs:  Clear throughout to auscultation.    Heart:  Regular rate and rhythm. Abdomen:  Soft, nontender and nondistended. Normal bowel sounds, without guarding, and without rebound.   Neurologic:  Alert and  oriented x4;  grossly normal neurologically.  Impression/Plan: Andrea Zhang is here for an endoscopy to be performed for dysphagia with history of esophageal stricture.  Risks, benefits, limitations, and alternatives regarding  endoscopy have been reviewed with the patient.  Questions have been answered.  All parties agreeable.   Lynnae Prude, MD  06/06/2016, 8:19 AM

## 2016-06-07 ENCOUNTER — Encounter: Payer: Self-pay | Admitting: Unknown Physician Specialty

## 2016-06-07 LAB — SURGICAL PATHOLOGY

## 2016-06-30 ENCOUNTER — Other Ambulatory Visit: Payer: Self-pay | Admitting: Nurse Practitioner

## 2016-06-30 DIAGNOSIS — Z1231 Encounter for screening mammogram for malignant neoplasm of breast: Secondary | ICD-10-CM

## 2017-09-07 DIAGNOSIS — E1121 Type 2 diabetes mellitus with diabetic nephropathy: Secondary | ICD-10-CM | POA: Insufficient documentation

## 2017-09-07 DIAGNOSIS — E538 Deficiency of other specified B group vitamins: Secondary | ICD-10-CM | POA: Insufficient documentation

## 2017-09-12 ENCOUNTER — Other Ambulatory Visit: Payer: Self-pay | Admitting: Nurse Practitioner

## 2017-09-12 DIAGNOSIS — Z1231 Encounter for screening mammogram for malignant neoplasm of breast: Secondary | ICD-10-CM

## 2017-10-04 ENCOUNTER — Ambulatory Visit
Admission: RE | Admit: 2017-10-04 | Discharge: 2017-10-04 | Disposition: A | Discharge status: Home / Self Care | Payer: BLUE CROSS/BLUE SHIELD | Source: Ambulatory Visit | Attending: Nurse Practitioner | Admitting: Nurse Practitioner

## 2017-10-04 DIAGNOSIS — Z1231 Encounter for screening mammogram for malignant neoplasm of breast: Secondary | ICD-10-CM | POA: Insufficient documentation

## 2018-01-15 ENCOUNTER — Inpatient Hospital Stay: Payer: BLUE CROSS/BLUE SHIELD | Attending: Oncology | Admitting: Oncology

## 2018-01-15 ENCOUNTER — Inpatient Hospital Stay: Payer: BLUE CROSS/BLUE SHIELD

## 2018-01-15 ENCOUNTER — Encounter: Payer: Self-pay | Admitting: Oncology

## 2018-01-15 ENCOUNTER — Other Ambulatory Visit: Payer: Self-pay | Admitting: *Deleted

## 2018-01-15 VITALS — BP 118/80 | HR 88

## 2018-01-15 VITALS — BP 103/73 | HR 91 | Temp 98.7°F | Resp 18 | Ht 61.0 in | Wt 210.5 lb

## 2018-01-15 DIAGNOSIS — D509 Iron deficiency anemia, unspecified: Secondary | ICD-10-CM

## 2018-01-15 DIAGNOSIS — D649 Anemia, unspecified: Secondary | ICD-10-CM | POA: Diagnosis present

## 2018-01-15 DIAGNOSIS — E538 Deficiency of other specified B group vitamins: Secondary | ICD-10-CM

## 2018-01-15 LAB — CBC WITH DIFFERENTIAL/PLATELET
BASOS PCT: 1 %
Basophils Absolute: 0.1 10*3/uL (ref 0–0.1)
EOS ABS: 0.2 10*3/uL (ref 0–0.7)
EOS PCT: 2 %
HCT: 24 % — ABNORMAL LOW (ref 35.0–47.0)
Hemoglobin: 7.5 g/dL — ABNORMAL LOW (ref 12.0–16.0)
LYMPHS ABS: 1.6 10*3/uL (ref 1.0–3.6)
Lymphocytes Relative: 22 %
MCH: 24.4 pg — AB (ref 26.0–34.0)
MCHC: 31.2 g/dL — ABNORMAL LOW (ref 32.0–36.0)
MCV: 78.2 fL — ABNORMAL LOW (ref 80.0–100.0)
MONO ABS: 0.6 10*3/uL (ref 0.2–0.9)
MONOS PCT: 8 %
Neutro Abs: 5 10*3/uL (ref 1.4–6.5)
Neutrophils Relative %: 67 %
Platelets: 299 10*3/uL (ref 150–440)
RBC: 3.08 MIL/uL — ABNORMAL LOW (ref 3.80–5.20)
RDW: 18.1 % — AB (ref 11.5–14.5)
WBC: 7.4 10*3/uL (ref 3.6–11.0)

## 2018-01-15 LAB — URINALYSIS, COMPLETE (UACMP) WITH MICROSCOPIC
Glucose, UA: NEGATIVE mg/dL
HGB URINE DIPSTICK: NEGATIVE
NITRITE: NEGATIVE
PH: 5 (ref 5.0–8.0)
Protein, ur: NEGATIVE mg/dL
SPECIFIC GRAVITY, URINE: 1.02 (ref 1.005–1.030)

## 2018-01-15 LAB — IRON AND TIBC
IRON: 92 ug/dL (ref 28–170)
SATURATION RATIOS: 16 % (ref 10.4–31.8)
TIBC: 567 ug/dL — AB (ref 250–450)
UIBC: 475 ug/dL

## 2018-01-15 LAB — FERRITIN: FERRITIN: 7 ng/mL — AB (ref 11–307)

## 2018-01-15 LAB — FOLATE: Folate: 7.6 ng/mL (ref >5.9)

## 2018-01-15 MED ORDER — CYANOCOBALAMIN 1000 MCG/ML IJ SOLN
1000.0000 ug | INTRAMUSCULAR | Status: DC
  Administered 2018-01-15: 1000 ug via INTRAMUSCULAR
  Filled 2018-01-15: qty 1

## 2018-01-15 MED ORDER — SODIUM CHLORIDE 0.9 % IV SOLN
510.0000 mg | Freq: Once | INTRAVENOUS | Status: AC
  Administered 2018-01-15: 510 mg via INTRAVENOUS
  Filled 2018-01-15: qty 17

## 2018-01-15 MED ORDER — SODIUM CHLORIDE 0.9 % IV SOLN
Freq: Once | INTRAVENOUS | Status: AC
  Administered 2018-01-15: 15:00:00 via INTRAVENOUS
  Filled 2018-01-15: qty 1000

## 2018-01-15 NOTE — Progress Notes (Signed)
Hematology/Oncology Consult note [facility] Telephone:([phone] [phone]  Patient Care Team: Myrene Buddy, NP as PCP - General (Internal Medicine)   Name of the patient: Andrea Zhang  [id number]  [birth year]    Reason for referral- anemia   Referring physician- Dr. Bayard Males  Date of visit: 01/15/18   History of presenting illness-patient is a 57 year old female with a past medical history significant for hypertension hyperlipidemia, asthma diabetes osteoarthritis among other medical problems.  She also has chronic arthritis for which she sees Dr. Gavin Potters and has been getting joint injections.  She was seen by her PCP on 01/12/2018 with symptoms of lightheadedness and worsening shortness of breath.  She has been earlier seen by Dr. Lady Gary from cardiology on 12/19/2017 and underwent stress test and echocardiogram that was normal.  Blood work done on 01/12/2018 was as follows: CBC showed white count of 8.4, H&H of 6.7/22.6 with an MCV of 81 and a platelet count of 324.  Differential on the CBC was normal.  Iron study showed TIBC that was elevated at 546.  Percentage iron saturation was 36.  Ferritin levels were not checked.  CMP and TSH were within normal.  B12 levels in November 2018 were low at 241.  Upper endoscopy from July 27 showed a Schatzki's ring which was dilated and evidence of gastritis.  Duodenum was normal.  She has had 2-3 EGDs in the past for strictures requiring dilatation.  She has had colonoscopy back in 2011 which was apparently normal.  Patient takes etodolac twice a day for her arthritis daily.  She denies any blood loss in her urine or stool.  She denies any dark melanotic stools.  However does note that her stools have been darker after she started taking iron pills about a week ago.  She was taking oral iron for quite some time and then stopped taking it a couple of months ago.  She is also restarted taking her oral B12 tablets over  the last 1 week.  Her appetite is good and denies any unintentional weight loss.  In fact she has gained weight.  Denies any abdominal pain.  No family history of colon cancer.  Her last menstrual period was in 2013.  She currently feels fatigued and short of breath with minimal exertion.  She has had occasional dizzy spells but denies any falls  ECOG PS- 1  Pain scale- 0   Review of systems- Review of Systems  Constitutional: Positive for malaise/fatigue. Negative for chills, fever and weight loss.  HENT: Negative for congestion, ear discharge and nosebleeds.   Eyes: Negative for blurred vision.  Respiratory: Positive for shortness of breath. Negative for cough, hemoptysis, sputum production and wheezing.   Cardiovascular: Negative for chest pain, palpitations, orthopnea and claudication.  Gastrointestinal: Negative for abdominal pain, blood in stool, constipation, diarrhea, heartburn, melena, nausea and vomiting.  Genitourinary: Negative for dysuria, flank pain, frequency, hematuria and urgency.  Musculoskeletal: Negative for back pain, joint pain and myalgias.  Skin: Negative for rash.  Neurological: Positive for dizziness. Negative for tingling, focal weakness, seizures, weakness and headaches.  Endo/Heme/Allergies: Does not bruise/bleed easily.  Psychiatric/Behavioral: Negative for depression and suicidal ideas. The patient does not have insomnia.     Allergies  Allergen Reactions  . Iodinated Diagnostic Agents Other (See Comments)    Becomes 'unresponsive' to ORAL and IV DYE  . Maxalt [Rizatriptan Benzoate] Other (See Comments)    'Heart Races'  . Orphenadrine Citrate Other (See Comments)  Patient unsure of this allergy  . Zithromax [Azithromycin] Other (See Comments)    Severe Abdominal Cramps  . Zomig [Zolmitriptan] Other (See Comments)    'Heart Races'  . Cephalexin Rash  . Diclofenac Hives    There are no active problems to display for this patient.    Past  Medical History:  Diagnosis Date  . Arthritis   . Asthma   . Diabetes mellitus without complication (HCC)   . GERD (gastroesophageal reflux disease)   . Hypercholesterolemia   . Hypertension   . Migraines      Past Surgical History:  Procedure Laterality Date  . CHOLECYSTECTOMY    . ESOPHAGOGASTRODUODENOSCOPY (EGD) WITH PROPOFOL N/A 06/06/2016   Procedure: ESOPHAGOGASTRODUODENOSCOPY (EGD) WITH PROPOFOL;  Surgeon: Scot Jun, MD;  Location: [facility] ENDOSCOPY;  Service: Endoscopy;  Laterality: N/A;  . JOINT REPLACEMENT     LT TKR  . SAVORY DILATION N/A 06/06/2016   Procedure: SAVORY DILATION;  Surgeon: Scot Jun, MD;  Location: [facility] ENDOSCOPY;  Service: Endoscopy;  Laterality: N/A;    Social History   Socioeconomic History  . Marital status: Married    Spouse name: Not on file  . Number of children: Not on file  . Years of education: Not on file  . Highest education level: Not on file  Social Needs  . Financial resource strain: Not on file  . Food insecurity - worry: Not on file  . Food insecurity - inability: Not on file  . Transportation needs - medical: Not on file  . Transportation needs - non-medical: Not on file  Occupational History  . Not on file  Tobacco Use  . Smoking status: Never Smoker  . Smokeless tobacco: Never Used  Substance and Sexual Activity  . Alcohol use: No  . Drug use: No  . Sexual activity: Not on file  Other Topics Concern  . Not on file  Social History Narrative  . Not on file     Family History  Problem Relation Age of Onset  . COPD Mother   . Heart disease Mother   . Asthma Mother   . Schizophrenia Mother   . Hemophilia Father   . Prostate cancer Father   . Heart disease Father   . Epilepsy Sister   . Stroke Sister   . Alcohol abuse Sister   . Hypertension Sister   . Epilepsy Brother   . Lupus Son   . Gallbladder disease Son   . Breast cancer Neg Hx      Current Outpatient Medications:  .  albuterol (PROVENTIL  HFA;VENTOLIN HFA) 108 (90 Base) MCG/ACT inhaler, Inhale 2 puffs into the lungs every 6 (six) hours as needed for wheezing or shortness of breath., Disp: , Rfl:  .  aspirin EC 81 MG tablet, Take 81 mg by mouth daily., Disp: , Rfl:  .  atenolol (TENORMIN) 50 MG tablet, Take 50 mg by mouth daily., Disp: , Rfl:  .  Cholecalciferol 2000 units CAPS, Take 2,000 Units by mouth daily., Disp: , Rfl:  .  cyclobenzaprine (FLEXERIL) 5 MG tablet, Take 5 mg by mouth 3 (three) times daily as needed for muscle spasms., Disp: , Rfl:  .  esomeprazole (NEXIUM) 40 MG capsule, Take 40 mg by mouth daily at 12 noon., Disp: , Rfl:  .  etodolac (LODINE) 400 MG tablet, Take 400 mg by mouth 2 (two) times daily., Disp: , Rfl:  .  ferrous gluconate (FERGON) 324 MG tablet, Take 324 mg by mouth  daily with breakfast., Disp: , Rfl:  .  Fluticasone-Salmeterol (ADVAIR) 250-50 MCG/DOSE AEPB, Inhale 1 puff into the lungs 2 (two) times daily., Disp: , Rfl:  .  Gabapentin Enacarbil 600 MG TBCR, Take 1 tablet by mouth every evening., Disp: , Rfl:  .  loratadine (CLARITIN) 10 MG tablet, Take 10 mg by mouth daily., Disp: , Rfl:  .  metFORMIN (GLUCOPHAGE) 1000 MG tablet, Take 1,000 mg by mouth 2 (two) times daily with a meal., Disp: , Rfl:  .  mirtazapine (REMERON) 30 MG tablet, Take 30 mg by mouth at bedtime., Disp: , Rfl:  .  naratriptan (AMERGE) 2.5 MG tablet, Take 2.5 mg by mouth as needed for migraine. Take one (1) tablet at onset of headache; if returns or does not resolve, may repeat after 4 hours; do not exceed five (5) mg in 24 hours., Disp: , Rfl:  .  promethazine (PHENERGAN) 25 MG tablet, Take 25 mg by mouth every 6 (six) hours as needed for nausea or vomiting., Disp: , Rfl:  .  simvastatin (ZOCOR) 10 MG tablet, Take 10 mg by mouth daily., Disp: , Rfl:    Physical exam:  Vitals:   01/15/18 1417  BP: 103/73  Pulse: 91  Resp: 18  Temp: 98.7 F (37.1 C)  TempSrc: Tympanic  Weight: 210 lb 8.6 oz (95.5 kg)  Height: 5\' 1"   (1.549 m)   Physical Exam  Constitutional: She is oriented to person, place, and time.  She is obese and does not appear to be in any acute distress  HENT:  Head: Normocephalic and atraumatic.  Eyes: EOM are normal. Pupils are equal, round, and reactive to light.  Neck: Normal range of motion.  Cardiovascular: Normal rate, regular rhythm and normal heart sounds.  Pulmonary/Chest: Effort normal and breath sounds normal.  Abdominal: Soft. Bowel sounds are normal.  Neurological: She is alert and oriented to person, place, and time.  Skin: Skin is warm and dry.       CMP Latest Ref Rng & Units 11/21/2013  Glucose 65 - 99 mg/dL 191(Y)  BUN 7 - 18 mg/dL 11  Creatinine 7.82 - 9.56 mg/dL 2.13  Sodium 086 - 578 mmol/L 136  Potassium 3.5 - 5.1 mmol/L 3.6  Chloride 98 - 107 mmol/L 105  CO2 21 - 32 mmol/L 26  Calcium 8.5 - 10.1 mg/dL 9.9  Total Protein 6.4 - 8.2 g/dL 7.8  Total Bilirubin 0.2 - 1.0 mg/dL 0.3  Alkaline Phos Unit/L 110  AST 15 - 37 Unit/L 32  ALT 12 - 78 U/L 45   CBC Latest Ref Rng & Units 11/21/2013  WBC 3.6 - 11.0 x10 3/mm 3 11.5(H)  Hemoglobin 12.0 - 16.0 g/dL 46.9  Hematocrit 62.9 - 47.0 % 44.1  Platelets 150 - 440 x10 3/mm 3 411    Assessment and plan- Patient is a 57 y.o. female referred for normocytic anemia  Patient's hemoglobin in July 2017 was 14.4 and dropped to 6.7 on 01/12/2018.  Iron studies do reveal an elevated TIBC of 546 although her serum iron was high normal at 196.  B12 levels were last checked in November 2018 were low at 241.  Today I will do a complete anemia workup including CBC with differential, ferritin and iron studies, B12 folate, reticulocyte, haptoglobin and myeloma panel.  I will also check urinalysis, celiac disease panel and stool H. pylori antigen.  Given that she is significantly anemic I would like to give her IV iron.  Discussed risks and  benefits of Feraheme 510 mg IV given weekly including all but not limited to headache, leg swelling  and possible risk of infusion reaction.  Patient understands and agrees to proceed.  We will plan to give her the first dose of IV iron today if her insurance would approve it and second dose next week.  I will see her back in 2 months time with repeat CBC ferritin and iron studies.  If her hemoglobin on today's check is less than 7 I will plan to give her 1 unit of PRBC transfusion tomorrow.  Discussed risks and benefits of PRBC transfusion including all but not limited to possible risk of HIV and hepatitis C which is extremely rare.  Patient understands and agrees to proceed.  She has received blood transfusions in the past  Given that her b12 levels were low in November 2018 at 57- I will give her 1 dose of B12 today  With regards to etiology of her iron deficiency anemia: I will get in touch with [facility] clinic GI who she has seen in the past to see her ASAP for evaluation for potential EGD and colonoscopy plus minus capsule endoscopy given that she is severely anemic.  She has been taking NSAIDs consistently and she may have NSAID induced gastritis/ulcer. I am also checking urinalysis celiac disease panel and stool H. pylori antigen today   Thank you for this kind referral and the opportunity to participate in the care of this patient   Visit Diagnosis 1. Normocytic anemia   2. Iron deficiency anemia, unspecified iron deficiency anemia type   3. B12 deficiency     Dr. Owens Shark, MD, MPH [facility] Pager- [phone] 01/15/2018 2:45 PM

## 2018-01-16 LAB — SAMPLE TO BLOOD BANK

## 2018-01-16 LAB — RETICULOCYTES
RBC.: 3.19 MIL/uL — ABNORMAL LOW (ref 3.80–5.20)
RETIC COUNT ABSOLUTE: 239.3 10*3/uL — AB (ref 19.0–183.0)
Retic Ct Pct: 7.5 % — ABNORMAL HIGH (ref 0.4–3.1)

## 2018-01-16 LAB — VITAMIN B12: VITAMIN B 12: 210 pg/mL (ref 180–914)

## 2018-01-16 LAB — HAPTOGLOBIN: Haptoglobin: 153 mg/dL (ref 34–200)

## 2018-01-17 ENCOUNTER — Telehealth: Payer: Self-pay | Admitting: *Deleted

## 2018-01-17 LAB — CELIAC DISEASE PANEL
Endomysial Ab, IgA: NEGATIVE
IgA: 185 mg/dL (ref 87–352)

## 2018-01-17 NOTE — Telephone Encounter (Signed)
-----   Message from Creig HinesArchana C Rao, MD sent at 01/16/2018  8:24 AM EDT ----- b12 levels are low. She will get weekly b12 X4 followed by monthly as planned

## 2018-01-17 NOTE — Telephone Encounter (Signed)
Called and left message that pt b12 level was low so she will get several more b12 inj. She already has appt next week with iron and b12 and she should get new appt with the future appts for b21 shots. She was already told in clinic that she may need more b12 shots

## 2018-01-18 ENCOUNTER — Encounter: Payer: Self-pay | Admitting: Nurse Practitioner

## 2018-01-18 LAB — MULTIPLE MYELOMA PANEL, SERUM
ALBUMIN/GLOB SERPL: 1.3 (ref 0.7–1.7)
ALPHA2 GLOB SERPL ELPH-MCNC: 0.8 g/dL (ref 0.4–1.0)
Albumin SerPl Elph-Mcnc: 3.5 g/dL (ref 2.9–4.4)
Alpha 1: 0.3 g/dL (ref 0.0–0.4)
B-Globulin SerPl Elph-Mcnc: 1.1 g/dL (ref 0.7–1.3)
Gamma Glob SerPl Elph-Mcnc: 0.6 g/dL (ref 0.4–1.8)
Globulin, Total: 2.7 g/dL (ref 2.2–3.9)
IGA: 180 mg/dL (ref 87–352)
IGM (IMMUNOGLOBULIN M), SRM: 15 mg/dL — AB (ref 26–217)
IgG (Immunoglobin G), Serum: 579 mg/dL — ABNORMAL LOW (ref 700–1600)
Total Protein ELP: 6.2 g/dL (ref 6.0–8.5)

## 2018-01-22 ENCOUNTER — Inpatient Hospital Stay: Payer: BLUE CROSS/BLUE SHIELD

## 2018-01-22 VITALS — BP 108/72 | HR 80 | Temp 96.0°F | Resp 18

## 2018-01-22 DIAGNOSIS — D509 Iron deficiency anemia, unspecified: Secondary | ICD-10-CM

## 2018-01-22 DIAGNOSIS — D649 Anemia, unspecified: Secondary | ICD-10-CM | POA: Diagnosis not present

## 2018-01-22 LAB — CBC
HEMATOCRIT: 29.1 % — AB (ref 35.0–47.0)
Hemoglobin: 9.1 g/dL — ABNORMAL LOW (ref 12.0–16.0)
MCH: 26.3 pg (ref 26.0–34.0)
MCHC: 31.4 g/dL — AB (ref 32.0–36.0)
MCV: 83.8 fL (ref 80.0–100.0)
PLATELETS: 344 10*3/uL (ref 150–440)
RBC: 3.47 MIL/uL — ABNORMAL LOW (ref 3.80–5.20)
RDW: 25.7 % — AB (ref 11.5–14.5)
WBC: 7.9 10*3/uL (ref 3.6–11.0)

## 2018-01-22 LAB — SAMPLE TO BLOOD BANK

## 2018-01-22 MED ORDER — SODIUM CHLORIDE 0.9 % IV SOLN
Freq: Once | INTRAVENOUS | Status: AC
  Administered 2018-01-22: 14:00:00 via INTRAVENOUS
  Filled 2018-01-22: qty 1000

## 2018-01-22 MED ORDER — SODIUM CHLORIDE 0.9 % IV SOLN
510.0000 mg | Freq: Once | INTRAVENOUS | Status: AC
  Administered 2018-01-22: 510 mg via INTRAVENOUS
  Filled 2018-01-22: qty 17

## 2018-01-22 MED ORDER — FERUMOXYTOL INJECTION 510 MG/17 ML
INTRAVENOUS | Status: AC
  Filled 2018-01-22: qty 17

## 2018-01-22 MED ORDER — CYANOCOBALAMIN 1000 MCG/ML IJ SOLN
1000.0000 ug | INTRAMUSCULAR | Status: DC
  Administered 2018-01-22: 1000 ug via INTRAMUSCULAR
  Filled 2018-01-22: qty 1

## 2018-01-22 NOTE — Patient Instructions (Signed)

## 2018-01-24 LAB — H. PYLORI ANTIGEN, STOOL: H. Pylori Stool Ag, Eia: NEGATIVE

## 2018-01-26 ENCOUNTER — Ambulatory Visit: Payer: BLUE CROSS/BLUE SHIELD

## 2018-01-26 ENCOUNTER — Other Ambulatory Visit: Payer: BLUE CROSS/BLUE SHIELD

## 2018-02-02 ENCOUNTER — Ambulatory Visit: Payer: BLUE CROSS/BLUE SHIELD

## 2018-02-05 ENCOUNTER — Inpatient Hospital Stay: Payer: BLUE CROSS/BLUE SHIELD

## 2018-02-05 ENCOUNTER — Inpatient Hospital Stay: Payer: BLUE CROSS/BLUE SHIELD | Admitting: Oncology

## 2018-02-05 ENCOUNTER — Inpatient Hospital Stay: Payer: BLUE CROSS/BLUE SHIELD | Attending: Oncology

## 2018-02-05 DIAGNOSIS — D509 Iron deficiency anemia, unspecified: Secondary | ICD-10-CM | POA: Diagnosis not present

## 2018-02-05 DIAGNOSIS — D508 Other iron deficiency anemias: Secondary | ICD-10-CM

## 2018-02-05 HISTORY — PX: SAVORY DILATION: SHX5439

## 2018-02-05 HISTORY — PX: COLONOSCOPY WITH ESOPHAGOGASTRODUODENOSCOPY (EGD): SHX5779

## 2018-02-05 LAB — CBC
HCT: 30.8 % — ABNORMAL LOW (ref 35.0–47.0)
HEMOGLOBIN: 9.6 g/dL — AB (ref 12.0–16.0)
MCH: 27.4 pg (ref 26.0–34.0)
MCHC: 31.3 g/dL — AB (ref 32.0–36.0)
MCV: 87.5 fL (ref 80.0–100.0)
Platelets: 286 10*3/uL (ref 150–440)
RBC: 3.52 MIL/uL — ABNORMAL LOW (ref 3.80–5.20)
RDW: 23.7 % — ABNORMAL HIGH (ref 11.5–14.5)
WBC: 4 10*3/uL (ref 3.6–11.0)

## 2018-02-05 LAB — SAMPLE TO BLOOD BANK

## 2018-02-05 MED ORDER — CYANOCOBALAMIN 1000 MCG/ML IJ SOLN
1000.0000 ug | INTRAMUSCULAR | Status: DC
  Administered 2018-02-05: 1000 ug via INTRAMUSCULAR

## 2018-02-05 MED ORDER — CYANOCOBALAMIN 1000 MCG/ML IJ SOLN
INTRAMUSCULAR | Status: AC
Start: 2018-02-05 — End: ?
  Filled 2018-02-05: qty 1

## 2018-02-05 NOTE — Patient Instructions (Signed)
Cyanocobalamin, Vitamin B12 injection What is this medicine? CYANOCOBALAMIN (sye an oh koe BAL a min) is a man made form of vitamin B12. Vitamin B12 is used in the growth of healthy blood cells, nerve cells, and proteins in the body. It also helps with the metabolism of fats and carbohydrates. This medicine is used to treat people who can not absorb vitamin B12. This medicine may be used for other purposes; ask your health care provider or pharmacist if you have questions. COMMON BRAND NAME(S): B-12 Compliance Kit, B-12 Injection Kit, Cyomin, LA-12, Nutri-Twelve, Physicians EZ Use B-12, Primabalt What should I tell my health care provider before I take this medicine? They need to know if you have any of these conditions: -kidney disease -Leber's disease -megaloblastic anemia -an unusual or allergic reaction to cyanocobalamin, cobalt, other medicines, foods, dyes, or preservatives -pregnant or trying to get pregnant -breast-feeding How should I use this medicine? This medicine is injected into a muscle or deeply under the skin. It is usually given by a health care professional in a clinic or doctor's office. However, your doctor may teach you how to inject yourself. Follow all instructions. Talk to your pediatrician regarding the use of this medicine in children. Special care may be needed. Overdosage: If you think you have taken too much of this medicine contact a poison control center or emergency room at once. NOTE: This medicine is only for you. Do not share this medicine with others. What if I miss a dose? If you are given your dose at a clinic or doctor's office, call to reschedule your appointment. If you give your own injections and you miss a dose, take it as soon as you can. If it is almost time for your next dose, take only that dose. Do not take double or extra doses. What may interact with this medicine? -colchicine -heavy alcohol intake This list may not describe all possible  interactions. Give your health care provider a list of all the medicines, herbs, non-prescription drugs, or dietary supplements you use. Also tell them if you smoke, drink alcohol, or use illegal drugs. Some items may interact with your medicine. What should I watch for while using this medicine? Visit your doctor or health care professional regularly. You may need blood work done while you are taking this medicine. You may need to follow a special diet. Talk to your doctor. Limit your alcohol intake and avoid smoking to get the best benefit. What side effects may I notice from receiving this medicine? Side effects that you should report to your doctor or health care professional as soon as possible: -allergic reactions like skin rash, itching or hives, swelling of the face, lips, or tongue -blue tint to skin -chest tightness, pain -difficulty breathing, wheezing -dizziness -red, swollen painful area on the leg Side effects that usually do not require medical attention (report to your doctor or health care professional if they continue or are bothersome): -diarrhea -headache This list may not describe all possible side effects. Call your doctor for medical advice about side effects. You may report side effects to FDA at 1-800-FDA-1088. Where should I keep my medicine? Keep out of the reach of children. Store at room temperature between 15 and 30 degrees C (59 and 85 degrees F). Protect from light. Throw away any unused medicine after the expiration date. NOTE: This sheet is a summary. It may not cover all possible information. If you have questions about this medicine, talk to your doctor, pharmacist, or   health care provider.  2018 Elsevier/Gold Standard (2008-02-04 22:10:20)   

## 2018-03-08 ENCOUNTER — Telehealth: Payer: Self-pay | Admitting: *Deleted

## 2018-03-08 NOTE — Telephone Encounter (Signed)
-----   Message from Lauretta Grill sent at 03/08/2018  4:09 PM EDT ----- Regarding: cancel Monday appts? PT left VM that her colonoscopy not until 5-9 does Dr Smith Robert want her to R/S the Mondays appts?

## 2018-03-08 NOTE — Telephone Encounter (Signed)
Called pt and let her know that she still needs to come to the appt. Dr. Smith Robert wants to check her iron levels as well as hgb. We will see what colonoscopy results are and deal with that once done. Patient is agreeable to come monday

## 2018-03-12 ENCOUNTER — Encounter: Payer: Self-pay | Admitting: Oncology

## 2018-03-12 ENCOUNTER — Inpatient Hospital Stay: Payer: BLUE CROSS/BLUE SHIELD

## 2018-03-12 ENCOUNTER — Telehealth: Payer: Self-pay

## 2018-03-12 ENCOUNTER — Inpatient Hospital Stay: Payer: BLUE CROSS/BLUE SHIELD | Attending: Oncology | Admitting: Oncology

## 2018-03-12 VITALS — BP 116/71 | HR 72 | Temp 98.0°F | Resp 18 | Ht 61.0 in | Wt 217.2 lb

## 2018-03-12 DIAGNOSIS — D509 Iron deficiency anemia, unspecified: Secondary | ICD-10-CM

## 2018-03-12 DIAGNOSIS — E538 Deficiency of other specified B group vitamins: Secondary | ICD-10-CM | POA: Insufficient documentation

## 2018-03-12 DIAGNOSIS — Z79899 Other long term (current) drug therapy: Secondary | ICD-10-CM | POA: Diagnosis not present

## 2018-03-12 DIAGNOSIS — Z7984 Long term (current) use of oral hypoglycemic drugs: Secondary | ICD-10-CM | POA: Diagnosis not present

## 2018-03-12 DIAGNOSIS — Z8042 Family history of malignant neoplasm of prostate: Secondary | ICD-10-CM | POA: Diagnosis not present

## 2018-03-12 DIAGNOSIS — E119 Type 2 diabetes mellitus without complications: Secondary | ICD-10-CM | POA: Diagnosis not present

## 2018-03-12 LAB — CBC WITH DIFFERENTIAL/PLATELET
BASOS ABS: 0.1 10*3/uL (ref 0–0.1)
Basophils Relative: 1 %
Eosinophils Absolute: 0.2 10*3/uL (ref 0–0.7)
Eosinophils Relative: 3 %
HEMATOCRIT: 39 % (ref 35.0–47.0)
Hemoglobin: 12.7 g/dL (ref 12.0–16.0)
LYMPHS ABS: 1.4 10*3/uL (ref 1.0–3.6)
LYMPHS PCT: 16 %
MCH: 27.2 pg (ref 26.0–34.0)
MCHC: 32.4 g/dL (ref 32.0–36.0)
MCV: 83.8 fL (ref 80.0–100.0)
MONO ABS: 0.8 10*3/uL (ref 0.2–0.9)
Monocytes Relative: 9 %
NEUTROS ABS: 6.1 10*3/uL (ref 1.4–6.5)
Neutrophils Relative %: 71 %
Platelets: 318 10*3/uL (ref 150–440)
RBC: 4.66 MIL/uL (ref 3.80–5.20)
RDW: 17.3 % — ABNORMAL HIGH (ref 11.5–14.5)
WBC: 8.6 10*3/uL (ref 3.6–11.0)

## 2018-03-12 LAB — IRON AND TIBC
IRON: 103 ug/dL (ref 28–170)
Saturation Ratios: 23 % (ref 10.4–31.8)
TIBC: 452 ug/dL — AB (ref 250–450)
UIBC: 349 ug/dL

## 2018-03-12 LAB — FERRITIN: Ferritin: 12 ng/mL (ref 11–307)

## 2018-03-12 NOTE — Telephone Encounter (Signed)
-----   Message from Creig Hines, MD sent at 03/12/2018  4:22 PM EDT ----- Ferritin is still low at 12. We can give her 2 more doses of feraheme. Thanks, Ovidio Kin

## 2018-03-12 NOTE — Telephone Encounter (Signed)
Spoken with Ms. Andrea Zhang to inform her that her ferritin level were low and per Dr. Smith Robert request she will need 2 dose of feraheme. [name] has agreed to start treatment next week due to she has a colonoscopy due Thursday and has been asked to stop Iron at this time. She has agreed with understanding to start treatment any time and date next week.

## 2018-03-12 NOTE — Progress Notes (Signed)
No new changes noted today 

## 2018-03-12 NOTE — Progress Notes (Signed)
Hematology/Oncology Consult note [facility]  [phone] [phone]  Patient Care Team: Myrene Buddy, NP as PCP - General (Internal Medicine)   Name of the patient: Andrea Zhang  [id number]  04-26-1961   Date of visit: 03/12/18  Diagnosis- iron deficiency anemia. GI work up Comptroller Reason for visit- routine f/u of iron deficiency anemia  Heme/Onc history: patient is a 57 year old female with a past medical history significant for hypertension hyperlipidemia, asthma diabetes osteoarthritis among other medical problems.  She also has chronic arthritis for which she sees Dr. Gavin Potters and has been getting joint injections.  She was seen by her PCP on 01/12/2018 with symptoms of lightheadedness and worsening shortness of breath.  She has been earlier seen by Dr. Lady Gary from cardiology on 12/19/2017 and underwent stress test and echocardiogram that was normal.  Blood work done on 01/12/2018 was as follows: CBC showed white count of 8.4, H&H of 6.7/22.6 with an MCV of 81 and a platelet count of 324.  Differential on the CBC was normal.  Iron study showed TIBC that was elevated at 546.  Percentage iron saturation was 36.  Ferritin levels were not checked.  CMP and TSH were within normal.  B12 levels in November 2018 were low at 241.  Upper endoscopy from July 27 showed a Schatzki's ring which was dilated and evidence of gastritis.  Duodenum was normal.  She has had 2-3 EGDs in the past for strictures requiring dilatation.  She has had colonoscopy back in 2011 which was apparently normal.  Results of blood work from 01/15/2018 were as follows: CBC showed white count of 7.4, H&H of 7.5/24 with an MCV of 78.2 and a platelet count of 299.  Ferritin levels were low at 7.  Iron studies showed a low iron saturation and elevated TIBC of 560 haptoglobin was normal, celiac disease panel was negative, myeloma panel revealed no monoclonal protein.   B12 level was low low at 210.  Reticulocyte count was elevated at 7.5 indicating response to anemia.  Folate level was normal at 7.6.  Urinalysis did not reveal any hematuria.  Stool H. pylori antigen was negative  Patient was referred to GI but had birth of her first grandchild and therefore did not get endoscopy sooner.  She received 2 doses of Feraheme in March 2019 as well as 3 doses of B12.  Repeat CBC from 02/05/2018 showed H&H of 9.6/30.8  Interval history- reports that he rpalpitations and sob have improved after receiving iv iron. She is on oral B12. She has EGD and colonoscopy scheduled in 2 days time. Reports no melena or blood in stools  ECOG PS- 0 Pain scale- 0   Review of systems- Review of Systems  Constitutional: Positive for malaise/fatigue. Negative for chills, fever and weight loss.  HENT: Negative for congestion, ear discharge and nosebleeds.   Eyes: Negative for blurred vision.  Respiratory: Positive for shortness of breath. Negative for cough, hemoptysis, sputum production and wheezing.   Cardiovascular: Negative for chest pain, palpitations, orthopnea and claudication.  Gastrointestinal: Negative for abdominal pain, blood in stool, constipation, diarrhea, heartburn, melena, nausea and vomiting.  Genitourinary: Negative for dysuria, flank pain, frequency, hematuria and urgency.  Musculoskeletal: Negative for back pain, joint pain and myalgias.  Skin: Negative for rash.  Neurological: Negative for dizziness, tingling, focal weakness, seizures, weakness and headaches.  Endo/Heme/Allergies: Does not bruise/bleed easily.  Psychiatric/Behavioral: Negative for depression and suicidal ideas. The patient does not  have insomnia.      Allergies  Allergen Reactions  . Iodinated Diagnostic Agents Other (See Comments)    Becomes 'unresponsive' to ORAL and IV DYE  . Maxalt [Rizatriptan Benzoate] Other (See Comments)    'Heart Races'  . Orphenadrine Citrate Other (See Comments)      Patient unsure of this allergy  . Zithromax [Azithromycin] Other (See Comments)    Severe Abdominal Cramps  . Zomig [Zolmitriptan] Other (See Comments)    'Heart Races'  . Cephalexin Rash  . Diclofenac Hives     Past Medical History:  Diagnosis Date  . Arthritis   . Asthma   . Diabetes mellitus without complication (HCC)   . GERD (gastroesophageal reflux disease)   . Hypercholesterolemia   . Hypertension   . Migraines      Past Surgical History:  Procedure Laterality Date  . CHOLECYSTECTOMY    . ESOPHAGOGASTRODUODENOSCOPY (EGD) WITH PROPOFOL N/A 06/06/2016   Procedure: ESOPHAGOGASTRODUODENOSCOPY (EGD) WITH PROPOFOL;  Surgeon: Scot Jun, MD;  Location: [facility] ENDOSCOPY;  Service: Endoscopy;  Laterality: N/A;  . JOINT REPLACEMENT     LT TKR  . SAVORY DILATION N/A 06/06/2016   Procedure: SAVORY DILATION;  Surgeon: Scot Jun, MD;  Location[facility] ENDOSCOPY;  Service: Endoscopy;  Laterality: N/A;    Social History   Socioeconomic History  . Marital status: Married    Spouse name: Not on file  . Number of children: Not on file  . Years of education: Not on file  . Highest education level: Not on file  Occupational History  . Not on file  Social Needs  . Financial resource strain: Not on file  . Food insecurity:    Worry: Not on file    Inability: Not on file  . Transportation needs:    Medical: Not on file    Non-medical: Not on file  Tobacco Use  . Smoking status: Never Smoker  . Smokeless tobacco: Never Used  Substance and Sexual Activity  . Alcohol use: No  . Drug use: No  . Sexual activity: Not on file  Lifestyle  . Physical activity:    Days per week: Not on file    Minutes per session: Not on file  . Stress: Not on file  Relationships  . Social connections:    Talks on phone: Not on file    Gets together: Not on file    Attends religious service: Not on file    Active member of club or organization: Not on file    Attends meetings of  clubs or organizations: Not on file    Relationship status: Not on file  . Intimate partner violence:    Fear of current or ex partner: Not on file    Emotionally abused: Not on file    Physically abused: Not on file    Forced sexual activity: Not on file  Other Topics Concern  . Not on file  Social History Narrative  . Not on file    Family History  Problem Relation Age of Onset  . COPD Mother   . Heart disease Mother   . Asthma Mother   . Schizophrenia Mother   . Hemophilia Father   . Prostate cancer Father   . Heart disease Father   . Epilepsy Sister   . Stroke Sister   . Alcohol abuse Sister   . Hypertension Sister   . Epilepsy Brother   . Lupus Son   . Gallbladder disease Son   .  Breast cancer Neg Hx      Current Outpatient Medications:  .  albuterol (PROVENTIL HFA;VENTOLIN HFA) 108 (90 Base) MCG/ACT inhaler, Inhale 2 puffs into the lungs every 6 (six) hours as needed for wheezing or shortness of breath., Disp: , Rfl:  .  aspirin EC 81 MG tablet, Take 81 mg by mouth daily., Disp: , Rfl:  .  atenolol (TENORMIN) 50 MG tablet, Take 50 mg by mouth daily., Disp: , Rfl:  .  Cholecalciferol 2000 units CAPS, Take 2,000 Units by mouth daily., Disp: , Rfl:  .  cyclobenzaprine (FLEXERIL) 5 MG tablet, Take 5 mg by mouth 3 (three) times daily as needed for muscle spasms., Disp: , Rfl:  .  esomeprazole (NEXIUM) 40 MG capsule, Take 40 mg by mouth daily at 12 noon., Disp: , Rfl:  .  etodolac (LODINE) 400 MG tablet, Take 400 mg by mouth 2 (two) times daily., Disp: , Rfl:  .  ferrous gluconate (FERGON) 324 MG tablet, Take 324 mg by mouth daily with breakfast., Disp: , Rfl:  .  Fluticasone-Salmeterol (ADVAIR) 250-50 MCG/DOSE AEPB, Inhale 1 puff into the lungs 2 (two) times daily., Disp: , Rfl:  .  Gabapentin Enacarbil 600 MG TBCR, Take 1 tablet by mouth every evening., Disp: , Rfl:  .  loratadine (CLARITIN) 10 MG tablet, Take 10 mg by mouth daily., Disp: , Rfl:  .  metFORMIN  (GLUCOPHAGE) 1000 MG tablet, Take 1,000 mg by mouth 2 (two) times daily with a meal., Disp: , Rfl:  .  mirtazapine (REMERON) 30 MG tablet, Take 30 mg by mouth at bedtime., Disp: , Rfl:  .  naratriptan (AMERGE) 2.5 MG tablet, Take 2.5 mg by mouth as needed for migraine. Take one (1) tablet at onset of headache; if returns or does not resolve, may repeat after 4 hours; do not exceed five (5) mg in 24 hours., Disp: , Rfl:  .  promethazine (PHENERGAN) 25 MG tablet, Take 25 mg by mouth every 6 (six) hours as needed for nausea or vomiting., Disp: , Rfl:  .  simvastatin (ZOCOR) 10 MG tablet, Take 10 mg by mouth daily., Disp: , Rfl:   Physical exam:  Vitals:   03/12/18 1430  BP: 116/71  Pulse: 72  Resp: 18  Temp: 98 F (36.7 C)  TempSrc: Tympanic  SpO2: 99%  Weight: 217 lb 2.5 oz (98.5 kg)  Height: 5\' 1"  (1.549 m)   Physical Exam  Constitutional: She is oriented to person, place, and time. She appears well-developed and well-nourished.  HENT:  Head: Normocephalic and atraumatic.  Eyes: Pupils are equal, round, and reactive to light. EOM are normal.  Neck: Normal range of motion.  Cardiovascular: Normal rate, regular rhythm and normal heart sounds.  Pulmonary/Chest: Effort normal and breath sounds normal.  Abdominal: Soft. Bowel sounds are normal.  Neurological: She is alert and oriented to person, place, and time.  Skin: Skin is warm and dry.     CMP Latest Ref Rng & Units 11/21/2013  Glucose 65 - 99 mg/dL 409(W)  BUN 7 - 18 mg/dL 11  Creatinine 1.19 - 1.47 mg/dL 8.29  Sodium 562 - 130 mmol/L 136  Potassium 3.5 - 5.1 mmol/L 3.6  Chloride 98 - 107 mmol/L 105  CO2 21 - 32 mmol/L 26  Calcium 8.5 - 10.1 mg/dL 9.9  Total Protein 6.4 - 8.2 g/dL 7.8  Total Bilirubin 0.2 - 1.0 mg/dL 0.3  Alkaline Phos Unit/L 110  AST 15 - 37 Unit/L 32  ALT  12 - 78 U/L 45   CBC Latest Ref Rng & Units 03/12/2018  WBC 3.6 - 11.0 K/uL 8.6  Hemoglobin 12.0 - 16.0 g/dL 16.1  Hematocrit 09.6 - 47.0 % 39.0    Platelets 150 - 440 K/uL 318      Assessment and plan- Patient is a 57 y.o. female with severe iron deficiency and b12 deficiency anemia. GI work up pending  Hb has improved to 12.7 today. Iron studies are pending. We will call her and let her know if she needs IV iron.  Repeat cbc ferritin and iron studies in 3 and 6 months. I will see her in 6 months. Will check b12 levels in 3 months as well. Colonoscopy and EGD this week   Visit Diagnosis 1. Iron deficiency anemia, unspecified iron deficiency anemia type   2. B12 deficiency      Dr. Owens Shark, MD, MPH [facility] [phone] 03/12/2018 2:43 PM

## 2018-03-12 NOTE — Addendum Note (Signed)
Addended by: Owens Shark C on: 03/12/2018 04:26 PM   Modules accepted: Orders

## 2018-03-19 ENCOUNTER — Inpatient Hospital Stay: Payer: BLUE CROSS/BLUE SHIELD

## 2018-03-19 VITALS — BP 114/78 | HR 77 | Temp 97.0°F | Resp 18

## 2018-03-19 DIAGNOSIS — D509 Iron deficiency anemia, unspecified: Secondary | ICD-10-CM

## 2018-03-19 MED ORDER — SODIUM CHLORIDE 0.9 % IV SOLN
Freq: Once | INTRAVENOUS | Status: AC
  Administered 2018-03-19: 10:00:00 via INTRAVENOUS
  Filled 2018-03-19: qty 1000

## 2018-03-19 MED ORDER — SODIUM CHLORIDE 0.9 % IV SOLN
510.0000 mg | Freq: Once | INTRAVENOUS | Status: AC
  Administered 2018-03-19: 510 mg via INTRAVENOUS
  Filled 2018-03-19: qty 17

## 2018-03-19 NOTE — Patient Instructions (Signed)

## 2018-03-26 ENCOUNTER — Inpatient Hospital Stay: Payer: BLUE CROSS/BLUE SHIELD

## 2018-03-26 VITALS — BP 108/74 | HR 77 | Temp 96.9°F | Resp 18

## 2018-03-26 DIAGNOSIS — D509 Iron deficiency anemia, unspecified: Secondary | ICD-10-CM

## 2018-03-26 MED ORDER — FERUMOXYTOL INJECTION 510 MG/17 ML
INTRAVENOUS | Status: AC
  Filled 2018-03-26: qty 17

## 2018-03-26 MED ORDER — SODIUM CHLORIDE 0.9 % IV SOLN
510.0000 mg | Freq: Once | INTRAVENOUS | Status: AC
  Administered 2018-03-26: 510 mg via INTRAVENOUS
  Filled 2018-03-26: qty 17

## 2018-03-26 MED ORDER — SODIUM CHLORIDE 0.9 % IV SOLN
Freq: Once | INTRAVENOUS | Status: AC
  Administered 2018-03-26: 09:00:00 via INTRAVENOUS
  Filled 2018-03-26: qty 1000

## 2018-03-26 NOTE — Patient Instructions (Signed)

## 2018-04-03 ENCOUNTER — Other Ambulatory Visit: Payer: Self-pay | Admitting: Rheumatology

## 2018-04-03 DIAGNOSIS — G8929 Other chronic pain: Secondary | ICD-10-CM

## 2018-04-03 DIAGNOSIS — M25511 Pain in right shoulder: Principal | ICD-10-CM

## 2018-04-09 ENCOUNTER — Ambulatory Visit: Payer: BLUE CROSS/BLUE SHIELD | Attending: Rheumatology | Admitting: Physical Therapy

## 2018-04-09 DIAGNOSIS — G8929 Other chronic pain: Secondary | ICD-10-CM | POA: Diagnosis present

## 2018-04-09 DIAGNOSIS — M6281 Muscle weakness (generalized): Secondary | ICD-10-CM | POA: Insufficient documentation

## 2018-04-09 DIAGNOSIS — M25611 Stiffness of right shoulder, not elsewhere classified: Secondary | ICD-10-CM | POA: Insufficient documentation

## 2018-04-09 DIAGNOSIS — M25511 Pain in right shoulder: Secondary | ICD-10-CM | POA: Insufficient documentation

## 2018-04-14 ENCOUNTER — Encounter: Payer: Self-pay | Admitting: Physical Therapy

## 2018-04-14 NOTE — Therapy (Signed)
[facility] [address] [place], [place], [address] Phone: [phone]   Fax:  [phone]  Physical Therapy Evaluation  Patient Details  Name: Andrea Zhang MRN: [id number] Date of Birth: 05/19/1961 Referring Provider: Dr. Wayne Both.     Encounter Date: 04/09/2018  PT End of Session - 04/14/18 1423    Visit Number  1    Number of Visits  8    Date for PT Re-Evaluation  06/04/18    PT Start Time  0902    PT Stop Time  1000    PT Time Calculation (min)  58 min    Activity Tolerance  Patient limited by pain    Behavior During Therapy  [facility] for tasks assessed/performed       Past Medical History:  Diagnosis Date  . Arthritis   . Asthma   . Diabetes mellitus without complication (HCC)   . GERD (gastroesophageal reflux disease)   . Hypercholesterolemia   . Hypertension   . Migraines     Past Surgical History:  Procedure Laterality Date  . CHOLECYSTECTOMY    . ESOPHAGOGASTRODUODENOSCOPY (EGD) WITH PROPOFOL N/A 06/06/2016   Procedure: ESOPHAGOGASTRODUODENOSCOPY (EGD) WITH PROPOFOL;  Surgeon: Scot Jun, MD;  Location: [facility] ENDOSCOPY;  Service: Endoscopy;  Laterality: N/A;  . JOINT REPLACEMENT     LT TKR  . SAVORY DILATION N/A 06/06/2016   Procedure: SAVORY DILATION;  Surgeon: Scot Jun, MD;  Location: [facility] ENDOSCOPY;  Service: Endoscopy;  Laterality: N/A;    There were no vitals filed for this visit.   Subjective Assessment - 04/14/18 1414    Subjective  Pt. reports chronic R shoulder pain.  Pt. had injection 08/2017 which helped R sh.  Pt. had another injection 02/2018 with no benefit.  Pt. has difficulty dressing/ bathing due to R shoulder pain.      Pertinent History  s/p L TKA 5 years ago (no issues).  Decrease iron and c/o SOB currently.  See recent medical issues.  Pt. not sleeping well on R side due to pain.      Limitations  Lifting;Writing;House hold activities    Patient Stated Goals  Increase  R shoulder pain-free mobility.      Currently in Pain?  Yes    Pain Score  3     Pain Location  Shoulder    Pain Orientation  Right    Pain Descriptors / Indicators  Aching    Pain Type  Chronic pain    Aggravating Factors   overhead reaching/ lifting.     Effect of Pain on Daily Activities  3/10 R sh. pain at rest.  >10/10 with reaching.           [facility] PT Assessment - 04/14/18 0001      Assessment   Medical Diagnosis  Chronic right shoulder pain    Referring Provider  Dr. Andrez Grime, Montez Hageman.      Onset Date/Surgical Date  08/07/17    Hand Dominance  Right      Prior Function   Level of Independence  Independent             See HEP    PT Education - 04/14/18 1423    Education Details  See HEP    Person(s) Educated  Patient    Methods  Explanation;Demonstration;Handout    Comprehension  Verbalized understanding;Returned demonstration          PT Long Term Goals - 04/14/18 1433   [facility] [address] [place], [place], [address] Phone: [phone]   Fax:  [phone]  Physical Therapy Evaluation  Patient Details  Name: Andrea Zhang MRN: [id number] Date of Birth: 05/19/1961 Referring Provider: Dr. Wayne Both.     Encounter Date: 04/09/2018  PT End of Session - 04/14/18 1423    Visit Number  1    Number of Visits  8    Date for PT Re-Evaluation  06/04/18    PT Start Time  0902    PT Stop Time  1000    PT Time Calculation (min)  58 min    Activity Tolerance  Patient limited by pain    Behavior During Therapy  [facility] for tasks assessed/performed       Past Medical History:  Diagnosis Date  . Arthritis   . Asthma   . Diabetes mellitus without complication (HCC)   . GERD (gastroesophageal reflux disease)   . Hypercholesterolemia   . Hypertension   . Migraines     Past Surgical History:  Procedure Laterality Date  . CHOLECYSTECTOMY    . ESOPHAGOGASTRODUODENOSCOPY (EGD) WITH PROPOFOL N/A 06/06/2016   Procedure: ESOPHAGOGASTRODUODENOSCOPY (EGD) WITH PROPOFOL;  Surgeon: Scot Jun, MD;  Location: [facility] ENDOSCOPY;  Service: Endoscopy;  Laterality: N/A;  . JOINT REPLACEMENT     LT TKR  . SAVORY DILATION N/A 06/06/2016   Procedure: SAVORY DILATION;  Surgeon: Scot Jun, MD;  Location: [facility] ENDOSCOPY;  Service: Endoscopy;  Laterality: N/A;    There were no vitals filed for this visit.   Subjective Assessment - 04/14/18 1414    Subjective  Pt. reports chronic R shoulder pain.  Pt. had injection 08/2017 which helped R sh.  Pt. had another injection 02/2018 with no benefit.  Pt. has difficulty dressing/ bathing due to R shoulder pain.      Pertinent History  s/p L TKA 5 years ago (no issues).  Decrease iron and c/o SOB currently.  See recent medical issues.  Pt. not sleeping well on R side due to pain.      Limitations  Lifting;Writing;House hold activities    Patient Stated Goals  Increase  R shoulder pain-free mobility.      Currently in Pain?  Yes    Pain Score  3     Pain Location  Shoulder    Pain Orientation  Right    Pain Descriptors / Indicators  Aching    Pain Type  Chronic pain    Aggravating Factors   overhead reaching/ lifting.     Effect of Pain on Daily Activities  3/10 R sh. pain at rest.  >10/10 with reaching.           [facility] PT Assessment - 04/14/18 0001      Assessment   Medical Diagnosis  Chronic right shoulder pain    Referring Provider  Dr. Andrez Grime, Montez Hageman.      Onset Date/Surgical Date  08/07/17    Hand Dominance  Right      Prior Function   Level of Independence  Independent             See HEP    PT Education - 04/14/18 1423    Education Details  See HEP    Person(s) Educated  Patient    Methods  Explanation;Demonstration;Handout    Comprehension  Verbalized understanding;Returned demonstration          PT Long Term Goals - 04/14/18 1433  [id number] 04/14/2018, 2:39 PM  [facility] [address] [place], [place], [address] Phone: [phone]   Fax:  [phone]  Name: Andrea Zhang MRN: [id number] Date of Birth: May 01, 1961

## 2018-04-17 ENCOUNTER — Telehealth: Payer: Self-pay | Admitting: *Deleted

## 2018-04-17 ENCOUNTER — Ambulatory Visit
Admission: RE | Admit: 2018-04-17 | Discharge: 2018-04-17 | Disposition: A | Discharge status: Home / Self Care | Payer: BLUE CROSS/BLUE SHIELD | Source: Ambulatory Visit | Attending: Rheumatology | Admitting: Rheumatology

## 2018-04-17 DIAGNOSIS — M25511 Pain in right shoulder: Secondary | ICD-10-CM | POA: Diagnosis present

## 2018-04-17 DIAGNOSIS — M19011 Primary osteoarthritis, right shoulder: Secondary | ICD-10-CM | POA: Diagnosis not present

## 2018-04-17 DIAGNOSIS — M7551 Bursitis of right shoulder: Secondary | ICD-10-CM | POA: Insufficient documentation

## 2018-04-17 DIAGNOSIS — D509 Iron deficiency anemia, unspecified: Secondary | ICD-10-CM

## 2018-04-17 DIAGNOSIS — G8929 Other chronic pain: Secondary | ICD-10-CM | POA: Insufficient documentation

## 2018-04-17 NOTE — Telephone Encounter (Signed)
Patient called and reports that she is having symptoms of low iron, cold all the time, ringing in ears, NOT shortness of breath at this time. Asking if she should be checked.Please advise  CBC with Differential    Ref Range & Units 233mo ago  WBC 3.6 - 11.0 K/uL 8.6   RBC 3.80 - 5.20 MIL/uL 4.66   Hemoglobin 12.0 - 16.0 g/dL 96.012.7   HCT 45.435.0 - 09.847.0 % 39.0   MCV 80.0 - 100.0 fL 83.8   MCH 26.0 - 34.0 pg 27.2   MCHC 32.0 - 36.0 g/dL 11.932.4   RDW 14.711.5 - 82.914.5 % 17.3High    Platelets 150 - 440 K/uL 318   Neutrophils Relative % % 71   Neutro Abs 1.4 - 6.5 K/uL 6.1   Lymphocytes Relative % 16   Lymphs Abs 1.0 - 3.6 K/uL 1.4   Monocytes Relative % 9   Monocytes Absolute 0.2 - 0.9 K/uL 0.8   Eosinophils Relative % 3   Eosinophils Absolute 0 - 0.7 K/uL 0.2   Basophils Relative % 1   Basophils Absolute 0 - 0.1 K/uL 0.1   Comment: Performed at [facility], [address] [place], [place] [address]  Resulting Agency  [facility]      Specimen Collected: 03/12/18 14:13 Last Resulted: 03/12/18 14:24          Result Notes for Iron and TIBC    Ref Range & Units 233mo ago  Iron 28 - 170 ug/dL 086103   TIBC 578250 - 469450 ug/dL [id number]    Saturation Ratios 10.4 - 31.8 % 23   UIBC ug/dL 132349   Comment: Performed at [facility], [address] [place], [place] [address]  Resulting Agency  [facility]      Specimen Collected: 03/12/18 14:13 Last Resulted: 03/12/18 16:04        Result Notes for Ferritin   Notes recorded by Creig Hinesao, Archana C, MD on 03/12/2018 at 4:22 PM EDT Ferritin is still low at 12. We can give her 2 more doses of feraheme. Thanks, Archana  Ferritin  Order: [id number]   Status:  Final result  Visible to patient:  Yes (MyChart)  Next appt:  04/18/2018 at 09:45 AM in [facility](Sherk, Kayleen MemosMichael C, PT)  Dx:  Iron deficiency anemia, unspecified i...   Ref Range & Units 233mo ago  Ferritin 11 - 307 ng/mL 12   Comment: Performed at [facility], [address] [place], [place] [address]  Resulting Agency  [facility]      Specimen Collected: 03/12/18 14:13 Last Resulted: 03/12/18 16:04

## 2018-04-17 NOTE — Telephone Encounter (Signed)
Per Dr Smith Robertao, after reviewing the Endoscopy reports asked that I check with patient regarding capsule study. I contacted patient and scheduled appointment for lab on Thursday per her request and asked her about capsule study. She states that was mentions on her pre scope visit and told that they would get back to her post scoping, but never have.She will conatact them regarding capsule study

## 2018-04-17 NOTE — Telephone Encounter (Signed)
Yes she can come this week and get cbc ferritin and iron studies checked. Let us obtain results of her recent egd and colonoscopy from her GI as well. Thanks, Ovidio KinArchana

## 2018-04-18 ENCOUNTER — Other Ambulatory Visit: Payer: BLUE CROSS/BLUE SHIELD

## 2018-04-18 ENCOUNTER — Encounter: Payer: BLUE CROSS/BLUE SHIELD | Admitting: Physical Therapy

## 2018-04-19 ENCOUNTER — Inpatient Hospital Stay: Payer: BLUE CROSS/BLUE SHIELD | Attending: Oncology

## 2018-04-19 ENCOUNTER — Ambulatory Visit: Payer: BLUE CROSS/BLUE SHIELD | Admitting: Physical Therapy

## 2018-04-19 ENCOUNTER — Encounter: Payer: Self-pay | Admitting: Physical Therapy

## 2018-04-19 DIAGNOSIS — G8929 Other chronic pain: Secondary | ICD-10-CM

## 2018-04-19 DIAGNOSIS — M25511 Pain in right shoulder: Secondary | ICD-10-CM | POA: Diagnosis not present

## 2018-04-19 DIAGNOSIS — D509 Iron deficiency anemia, unspecified: Secondary | ICD-10-CM | POA: Insufficient documentation

## 2018-04-19 DIAGNOSIS — M25611 Stiffness of right shoulder, not elsewhere classified: Secondary | ICD-10-CM

## 2018-04-19 DIAGNOSIS — M6281 Muscle weakness (generalized): Secondary | ICD-10-CM

## 2018-04-19 LAB — CBC WITH DIFFERENTIAL/PLATELET
BASOS ABS: 0 10*3/uL (ref 0–0.1)
Basophils Relative: 1 %
EOS PCT: 4 %
Eosinophils Absolute: 0.3 10*3/uL (ref 0–0.7)
HEMATOCRIT: 39.7 % (ref 35.0–47.0)
Hemoglobin: 13.1 g/dL (ref 12.0–16.0)
LYMPHS ABS: 1.7 10*3/uL (ref 1.0–3.6)
LYMPHS PCT: 22 %
MCH: 28.3 pg (ref 26.0–34.0)
MCHC: 33 g/dL (ref 32.0–36.0)
MCV: 85.8 fL (ref 80.0–100.0)
MONO ABS: 0.9 10*3/uL (ref 0.2–0.9)
MONOS PCT: 11 %
NEUTROS ABS: 4.9 10*3/uL (ref 1.4–6.5)
Neutrophils Relative %: 62 %
PLATELETS: 334 10*3/uL (ref 150–440)
RBC: 4.62 MIL/uL (ref 3.80–5.20)
RDW: 17.8 % — AB (ref 11.5–14.5)
WBC: 7.8 10*3/uL (ref 3.6–11.0)

## 2018-04-19 LAB — IRON AND TIBC
IRON: 76 ug/dL (ref 28–170)
Saturation Ratios: 21 % (ref 10.4–31.8)
TIBC: 369 ug/dL (ref 250–450)
UIBC: 294 ug/dL

## 2018-04-19 LAB — FERRITIN: Ferritin: 165 ng/mL (ref 11–307)

## 2018-04-19 NOTE — Patient Instructions (Signed)
Access Code: [email or web address]  URL: [email or web address]  Date: 04/19/2018  Prepared by: Dorene GrebeMichael Janylah Belgrave   Exercises  Isometric Shoulder Extension at Wall - 10 reps - 1 sets - 5 hold - 1x daily - 7x weekly  Isometric Shoulder Flexion at Wall - 10 reps - 1 sets - 5 hold - 1x daily - 7x weekly  Isometric Shoulder External Rotation at Wall - 10 reps - 1 sets - 5 hold - 1x daily - 7x weekly  Isometric Shoulder Abduction at Wall - 10 reps - 1 sets - 5 hold - 1x daily - 7x weekly

## 2018-04-20 ENCOUNTER — Telehealth: Payer: Self-pay | Admitting: *Deleted

## 2018-04-20 NOTE — Telephone Encounter (Signed)
No need for IV iron at this time. Keep follow up as scheduled. Speak to pcp about ongoing symptoms as they are not related to iron deficiency

## 2018-04-20 NOTE — Telephone Encounter (Signed)
Patient informed of doctor response and thanked me for calling

## 2018-04-20 NOTE — Telephone Encounter (Signed)
Patient called asking for lab results form yesterday  Dx:  Iron deficiency anemia, unspecified i...   Ref Range & Units 1d ago  WBC 3.6 - 11.0 K/uL 7.8   RBC 3.80 - 5.20 MIL/uL 4.62   Hemoglobin 12.0 - 16.0 g/dL 78.213.1   HCT 95.635.0 - 21.347.0 % 39.7   MCV 80.0 - 100.0 fL 85.8   MCH 26.0 - 34.0 pg 28.3   MCHC 32.0 - 36.0 g/dL 08.633.0   RDW 57.811.5 - 46.914.5 % 17.8High    Platelets 150 - 440 K/uL 334   Neutrophils Relative % % 62   Neutro Abs 1.4 - 6.5 K/uL 4.9   Lymphocytes Relative % 22   Lymphs Abs 1.0 - 3.6 K/uL 1.7   Monocytes Relative % 11   Monocytes Absolute 0.2 - 0.9 K/uL 0.9   Eosinophils Relative % 4   Eosinophils Absolute 0 - 0.7 K/uL 0.3   Basophils Relative % 1   Basophils Absolute 0 - 0.1 K/uL 0.0   Comment: Performed at [facility], [address] [place], [place] [address]  Resulting Agency  [facility]      Specimen Collected: 04/19/18 11:07       Dx:  Iron deficiency anemia, unspecified i...   Ref Range & Units 1d ago  Iron 28 - 170 ug/dL 76   TIBC 841250 - 324450 ug/dL 401369   Saturation Ratios 10.4 - 31.8 % 21   UIBC ug/dL 027294   Comment: Performed at [facility], [address] [place], [place] [address]  Resulting Agency  [facility]      Specimen Collected: 04/19/18 11:07 Last Resulted: 04/19/18 17:25       Dx:  Iron deficiency anemia, unspecified i...   Ref Range & Units 1d ago  Ferritin 11 - 307 ng/mL 165   Comment: Performed at [facility], [address] [place], [place] [address]  Resulting Agency  [facility]      Specimen Collected: 04/19/18 11:07 Last Resulted: 04/19/18 17:25

## 2018-04-22 NOTE — Therapy (Signed)
[place] [facility] [address] [place], [place], [address] Phone: [phone]   Fax:  [phone]  Physical Therapy Treatment  Patient Details  Name: Andrea Zhang MRN: [id number] Date of Birth: 06-Jun-1961 Referring Provider: Dr. Wayne Both.     Encounter Date: 04/19/2018     Treatment 2 of 8.   Recert date: 06/04/18   Past Medical History:  Diagnosis Date  . Arthritis   . Asthma   . Diabetes mellitus without complication (HCC)   . GERD (gastroesophageal reflux disease)   . Hypercholesterolemia   . Hypertension   . Migraines     Past Surgical History:  Procedure Laterality Date  . CHOLECYSTECTOMY    . ESOPHAGOGASTRODUODENOSCOPY (EGD) WITH PROPOFOL N/A 06/06/2016   Procedure: ESOPHAGOGASTRODUODENOSCOPY (EGD) WITH PROPOFOL;  Surgeon: Scot Jun, MD;  Location: [facility] ENDOSCOPY;  Service: Endoscopy;  Laterality: N/A;  . JOINT REPLACEMENT     LT TKR  . SAVORY DILATION N/A 06/06/2016   Procedure: SAVORY DILATION;  Surgeon: Scot Jun, MD;  Location: [facility] ENDOSCOPY;  Service: Endoscopy;  Laterality: N/A;    There were no vitals filed for this visit.      Pt. had MRI on 6/11 (results in EPIC)- significant partial tear of supraspinatus/ bone spur. Pt. has ortho MD appt. with Dr. Joice Lofts in early July. PT discussed results of MRI report. Pt. reports 2/10 R shoulder pain at rest and increase pain with overhead reaching. Pt. had some family issues this past weekend and reports good compliance with HEP.        Treatment:  There.ex.:  Reviewed HEP.   See addition of doorway isometrics.  Good technique.     Manual tx.:  Supine R shoulder AAROM all planes 10x each (pain tolerable). STM to R UT/deltoid/ proximal biceps musculature R shoulder AP/PA/ inf. Grade II-III mobs. 5x 20 sec. Each.    Discussed importance of ice to manage inflammation with increase activity/ exercises.        Pt. limited with  shoulder flexion/ abduction >145 deg. secondary to pain along supraspinatus tendon. Tenderness along tendon with palpation and varying hand placments during manual tx./ mobilizations. Good technique with initial HEP and addition of sh. isometrics. Pt. cued to avoid any pain provoking movement patterns/ resisted ex.      PT Long Term Goals - 04/14/18 1433      PT LONG TERM GOAL #1   Title  Pt. independent with HEP to increase R shoulder AROM to WNL as compared to L shoulder to improve pain-free mobility with reaching.      Baseline  Supine R/L shoulder AROM: flexion (152/168 deg.)- pain, abduction (148/168 deg.), ER (54 deg./ WNL), IR (55 deg./ WNL).     Time  8    Period  Weeks    Status  New    Target Date  06/04/18      PT LONG TERM GOAL #2   Title  Pt. will increase FOTO to 59 to improve R shoulder mobilty.      Baseline  FOTO baseline 43 on 6/3    Time  8    Period  Weeks    Status  New    Target Date  06/04/18      PT LONG TERM GOAL #3   Title  Pt. will demonstrate proper lifting/ carrying technique for >10# with no c/o R shoulder pain.      Baseline  Increase R shoulder pain with lifting/ carrying.  Time  8    Period  Weeks    Status  New    Target Date  06/04/18      PT LONG TERM GOAL #4   Title  Pt. able reaching overhead during bathing/ dressing with no c/o R shoulder pain.      Baseline  >10/10 R shoulder pain with reaching tasks.     Time  8    Period  Weeks    Status  New    Target Date  06/04/18              Patient will benefit from skilled therapeutic intervention in order to improve the following deficits and impairments:  Improper body mechanics, Pain, Postural dysfunction, Decreased mobility, Decreased activity tolerance, Decreased endurance, Decreased range of motion, Decreased strength, Hypomobility, Impaired UE functional use, Impaired flexibility  Visit Diagnosis: Chronic right shoulder pain  Shoulder joint stiffness, right  Muscle  weakness (generalized)     Problem List Patient Active Problem List   Diagnosis Date Noted  . Iron deficiency anemia 01/15/2018   Cammie McgeeMichael C Ryden Wainer, PT, DPT # [phone] 04/22/2018, 5:51 PM  [place] [facility] [address] [place], [place], [address] Phone: [phone]   Fax:  [phone]  Name: Andrea Zhang MRN: [id number] Date of Birth: 1961/08/02

## 2018-04-25 ENCOUNTER — Encounter: Payer: BLUE CROSS/BLUE SHIELD | Admitting: Physical Therapy

## 2018-04-26 ENCOUNTER — Ambulatory Visit: Payer: BLUE CROSS/BLUE SHIELD

## 2018-04-26 DIAGNOSIS — M25511 Pain in right shoulder: Principal | ICD-10-CM

## 2018-04-26 DIAGNOSIS — M6281 Muscle weakness (generalized): Secondary | ICD-10-CM

## 2018-04-26 DIAGNOSIS — G8929 Other chronic pain: Secondary | ICD-10-CM

## 2018-04-26 NOTE — Therapy (Signed)
[address] Phone: [phone]   Fax:  [phone]  Name: Andrea Zhang MRN: [id number] Date of Birth: [birth year]  [place] [facility] [address]. [place], [place], [address] Phone: [phone]   Fax:  [phone]  Physical Therapy Treatment  Patient Details  Name: Andrea Zhang MRN: [id number] Date of Birth: 06-13-1961 Referring Provider: Dr. Wayne Both.     Encounter Date: 04/26/2018  PT End of Session - 04/26/18 0955    Visit Number  3    Number of Visits  8    Date for PT Re-Evaluation  06/04/18    PT Start Time  0946    PT Stop Time  1030    PT Time Calculation (min)  44 min    Activity Tolerance  Patient limited by pain    Behavior During Therapy  [facility] for tasks assessed/performed       Past Medical History:  Diagnosis Date  . Arthritis   . Asthma   . Diabetes mellitus without complication (HCC)   . GERD (gastroesophageal reflux disease)   . Hypercholesterolemia   . Hypertension   . Migraines     Past Surgical History:  Procedure Laterality Date  . CHOLECYSTECTOMY    . ESOPHAGOGASTRODUODENOSCOPY (EGD) WITH PROPOFOL N/A 06/06/2016   Procedure: ESOPHAGOGASTRODUODENOSCOPY (EGD) WITH PROPOFOL;  Surgeon: Scot Jun, MD;  Location: [facility] ENDOSCOPY;  Service: Endoscopy;  Laterality: N/A;  . JOINT REPLACEMENT     LT TKR  . SAVORY DILATION N/A 06/06/2016   Procedure: SAVORY DILATION;  Surgeon: Scot Jun, MD;  Location: [facility] ENDOSCOPY;  Service: Endoscopy;  Laterality: N/A;    There were no vitals filed for this visit.  Subjective Assessment - 04/26/18 0954    Subjective  Pt reports that she is doing alright on this date. She is complaining of 7/10 R shoulder pain upon arrival. She is unsure if she is performing some of her HEP correctly and would like to review the exercises. No specific concerns at this time.     Pertinent History  s/p L TKA 5 years ago (no issues).  Decrease iron and c/o SOB currently.  See recent medical issues.  Pt. not sleeping well on R side due to pain.      Limitations  Lifting;Writing;House hold  activities    Patient Stated Goals  Increase R shoulder pain-free mobility.      Currently in Pain?  Yes    Pain Score  7     Pain Location  Shoulder    Pain Orientation  Right    Pain Descriptors / Indicators  Aching    Pain Type  Chronic pain    Pain Onset  Today          TREATMENT  Ther-ex  Supine AAROM with canes for flexion and abduction x 15 each direction, pt reports pain with eccentric lowering below 45 flexion so encouraged pt to stay in a pain-free range; Supine R shoulder serratus punch with manual resistance 2 x 10; Eccentric bicep strengthening in supine x 10; Standing isometric doorway abduction 5s hold x 10; Standing isometric doorway ER 5s hold x 10; Reviewed isometric flexion and extension with patient as well as pec stretches, scapular retractions, and rows;  Manual Therapy  Supine PROM R shoulder in all planes, no deficits noted in range of motion. Pt denies pain with passive motion; R shoulder AP mobs at neutral, grade I-II, 30s/bout x 3 bouts; R shoulder inferior mobs at 90 abduction, grade I-II, 30s/bout x 3 bouts; STM to R UT/supraspinatus/deltoid/ proximal biceps musculature  [address] Phone: [phone]   Fax:  [phone]  Name: Andrea Zhang MRN: [id number] Date of Birth: [birth year]

## 2018-05-02 ENCOUNTER — Encounter: Payer: BLUE CROSS/BLUE SHIELD | Admitting: Physical Therapy

## 2018-05-03 ENCOUNTER — Ambulatory Visit: Payer: BLUE CROSS/BLUE SHIELD | Admitting: Physical Therapy

## 2018-05-03 DIAGNOSIS — G8929 Other chronic pain: Secondary | ICD-10-CM

## 2018-05-03 DIAGNOSIS — M6281 Muscle weakness (generalized): Secondary | ICD-10-CM

## 2018-05-03 DIAGNOSIS — M25611 Stiffness of right shoulder, not elsewhere classified: Secondary | ICD-10-CM

## 2018-05-03 DIAGNOSIS — M25511 Pain in right shoulder: Secondary | ICD-10-CM | POA: Diagnosis not present

## 2018-05-04 NOTE — Therapy (Addendum)
PT LONG TERM GOAL #4   Title  Pt. able reaching overhead during bathing/ dressing with no c/o R shoulder pain.      Baseline  >10/10 R shoulder pain with reaching tasks.     Time  8    Period  Weeks    Status  New    Target Date  06/04/18         Mild tenderness with pation over R supraspinatus region/ prox. biceps. Pt. demonstrates functional R sh. flexion but pain limited with eccentric muscle control/ varying levels of resistance during ther.ex. Pt. will continues to focus on scapular/ shoulder strengthening ex. in a pain free range to improve stability of shoulder. Pt. is scheduled to see Dr. Joice Zhang to determine is surgical candidate to repair rotator cuff tear. Pt. will continue with HEP and contact PT after MD appt.       Patient will benefit from skilled therapeutic intervention in order to improve the following deficits and impairments:      Visit Diagnosis: Chronic right shoulder pain  Muscle weakness (generalized)  Shoulder joint stiffness, right     Problem List Patient Active Problem List   Diagnosis Date Noted  . Iron deficiency anemia 01/15/2018   Cammie McgeeMichael C Renesmay Nesbitt, PT, DPT # [phone] 05/04/2018, 4:33 PM  [place] [facility] [address] [place], [place], [address] Phone: [phone]   Fax:  [phone]  Name: Andrea GarfinkelMaria Zhang MRN: [id number] Date of Birth: 09/28/1961   [facility] [address]. [place], [place], [address] Phone: [phone]   Fax:  [phone]  Physical Therapy Treatment  Patient Details  Name: Andrea Zhang MRN: [id number] Date of Birth: 1961-08-26 Referring Provider: Dr. Wayne Zhang.     Encounter Date: 05/03/2018    Treatment 4 of 8.  Recert date: 06/04/18   Past Medical History:  Diagnosis Date  . Arthritis   . Asthma   . Diabetes mellitus without complication (HCC)   . GERD (gastroesophageal reflux disease)   . Hypercholesterolemia   . Hypertension   . Migraines     Past Surgical History:  Procedure Laterality Date  . CHOLECYSTECTOMY    . ESOPHAGOGASTRODUODENOSCOPY (EGD) WITH PROPOFOL N/A 06/06/2016   Procedure: ESOPHAGOGASTRODUODENOSCOPY (EGD) WITH PROPOFOL;  Surgeon: Scot Jun, MD;  Location: [facility] ENDOSCOPY;  Service: Endoscopy;  Laterality: N/A;  . JOINT REPLACEMENT     LT TKR  . SAVORY DILATION N/A 06/06/2016   Procedure: SAVORY DILATION;  Surgeon: Scot Jun, MD;  Location: [facility] ENDOSCOPY;  Service: Endoscopy;  Laterality: N/A;    There were no vitals filed for this visit.    Pt. Reports no significant R shoulder pain currently at rest but marked increase in pain during eccentric muscle control (5/10) during demonstration of AROM.  Pt. States she is doing HEP.           TREATMENT  Ther-ex  Supine AAROM with canes for flexion and abduction x 20 each direction, Light PT assist during eccentric lowering to prevent increase in pain.  Supine R shoulder serratus punch with manual resistance 2 x 10; Eccentric bicep strengthening in supine x 10; Standing isometric doorway ex. (all planes) B UBE 3 min. F/b (no increase in pain) Standing wall ladder with focus on eccentric muscle control/ lowering.   Manual Therapy  Supine PROM R shoulder in all planes, no deficits noted in range of motion. R shoulder AP mobs at neutral, grade I-II,  30s/bout x 3 bouts; R shoulder inferior mobs at 90 abduction, grade I-II, 30s/bout x 3 bouts; STM to R UT/supraspinatus/deltoid/ proximal biceps musculature       PT Long Term Goals - 04/14/18 1433      PT LONG TERM GOAL #1   Title  Pt. independent with HEP to increase R shoulder AROM to WNL as compared to L shoulder to improve pain-free mobility with reaching.      Baseline  Supine R/L shoulder AROM: flexion (152/168 deg.)- pain, abduction (148/168 deg.), ER (54 deg./ WNL), IR (55 deg./ WNL).     Time  8    Period  Weeks    Status  New    Target Date  06/04/18      PT LONG TERM GOAL #2   Title  Pt. will increase FOTO to 59 to improve R shoulder mobilty.      Baseline  FOTO baseline 43 on 6/3    Time  8    Period  Weeks    Status  New    Target Date  06/04/18      PT LONG TERM GOAL #3   Title  Pt. will demonstrate proper lifting/ carrying technique for >10# with no c/o R shoulder pain.      Baseline  Increase R shoulder pain with lifting/ carrying.     Time  8    Period  Weeks    Status  New    Target Date  06/04/18

## 2018-05-14 DIAGNOSIS — M75121 Complete rotator cuff tear or rupture of right shoulder, not specified as traumatic: Secondary | ICD-10-CM | POA: Insufficient documentation

## 2018-05-14 DIAGNOSIS — M7581 Other shoulder lesions, right shoulder: Secondary | ICD-10-CM | POA: Insufficient documentation

## 2018-05-17 ENCOUNTER — Encounter
Admission: RE | Admit: 2018-05-17 | Discharge: 2018-05-17 | Disposition: A | Discharge status: Home / Self Care | Payer: BLUE CROSS/BLUE SHIELD | Source: Ambulatory Visit | Attending: Surgery | Admitting: Surgery

## 2018-05-17 ENCOUNTER — Other Ambulatory Visit: Payer: Self-pay

## 2018-05-17 DIAGNOSIS — Z01812 Encounter for preprocedural laboratory examination: Secondary | ICD-10-CM | POA: Diagnosis not present

## 2018-05-17 HISTORY — DX: Anemia, unspecified: D64.9

## 2018-05-17 HISTORY — DX: Anxiety disorder, unspecified: F41.9

## 2018-05-17 HISTORY — DX: Personal history of urinary calculi: Z87.442

## 2018-05-17 LAB — BASIC METABOLIC PANEL
ANION GAP: 6 (ref 5–15)
BUN: 15 mg/dL (ref 6–20)
CHLORIDE: 103 mmol/L (ref 98–111)
CO2: 30 mmol/L (ref 22–32)
Calcium: 10.2 mg/dL (ref 8.9–10.3)
Creatinine, Ser: 0.61 mg/dL (ref 0.44–1.00)
GFR calc non Af Amer: >60 mL/min (ref >60)
Glucose, Bld: 96 mg/dL (ref 70–99)
Potassium: 4.4 mmol/L (ref 3.5–5.1)
Sodium: 139 mmol/L (ref 135–145)

## 2018-05-17 NOTE — Patient Instructions (Signed)
Your procedure is scheduled on: Thursday, July 18th  Report to THE SECOND FLOOR OF THE MEDICAL MALL.   DO NOT STOP ON THE FIRST FLOOR TO REGISTER.  To find out your arrival time please call [phone] between 1PM - 3PM on Wednesday,JULY 17TH  Remember: Instructions that are not followed completely may result in serious medical risk,  up to and including death, or upon the discretion of your surgeon and anesthesiologist your  surgery may need to be rescheduled.     _X__ 1. Do not eat food after midnight the night before your procedure.                 No gum chewing or hard candies. NOTHING SOLID IN YOUR MOUTH AFTER MIDNIGHT                  You may drink clear liquids up to 2 hours before you are scheduled to arrive for your surgery-                  DO not drink clear liquids within 2 hours of the start of your surgery.                  Clear Liquids include:  water, apple juice without pulp, clear carbohydrate                 drink such as Clearfast of Gatorade, Black Coffee or Tea (Do not add                 anything to coffee or tea).  __X__2.  On the morning of surgery brush your teeth with toothpaste and water,                   You may rinse your mouth with mouthwash if you wish.                      Do not swallow any toothpaste of mouthwash.     _X__ 3.  No Alcohol for 24 hours before or after surgery.   _X__ 4.  Do Not Smoke or use e-cigarettes For 24 Hours Prior to Your Surgery.                 Do not use any chewable tobacco products for at least 6 hours prior to                 surgery.  ____  5.  Bring all medications with you on the day of surgery if instructed.   ____  6.  Notify your doctor if there is any change in your medical condition      (cold, fever, infections).     Do not wear jewelry, make-up, hairpins, clips or nail polish.NO DEODORANT Do not wear lotions, powders, or perfumes.  Do not shave 48 hours prior to surgery. Men  may shave face and neck. Do not bring valuables to the hospital.    [facility] is not responsible for any belongings or valuables.  Contacts, dentures or bridgework may not be worn into surgery. Leave your suitcase in the car. After surgery it may be brought to your room. For patients admitted to the hospital, discharge time is determined by your treatment team.   Patients discharged the day of surgery will not be allowed to drive home.   Please read over the following fact sheets that you were given:   PREPARING FOR SURGERY  ADVANCED DIRECTIVES    _X___ Take these medicines the morning of surgery with A SIP OF WATER:    1.ATENOLOL  2. PRILOSEC  3. TRAMADOL, IF NEEDED  4. PROVENTIL INHALER  5.  6.  ____ Fleet Enema (as directed)   __X__ Use CHG Soap as directed  __X_ Use inhalers on the day of surgery  __X__ Stop ACTOPLUS 2 days prior to surgery. LAST DOSE ON July 15TH    __X__ Stop ASPIRIN AS OF TODAY.            THIS INCLUDES EXCEDRIN / BC POWDERS / GOODIES POWDERS  ___X_ Stop Anti-inflammatories AS OF TODAY.              THIS INCLUDED IBUPROFEN / MOTRIN / ADVIL / ALEVE / LODINE   ___ Stop supplements until after surgery.              ____ Bring C-Pap to the hospital.   CONTINUE TO TAKE VITAMIN D3 / FERGON / VITAMIN B12 AS USUAL BUT NOT ON THE MORNING OF SURGERY  CONTINUE TAKING THE HYDRODIURIL AS USUAL BUT DO NOT TAKE ON THE MORNING OF SURGERY.  CONTINUE TAKING ALL NIGHT TIME MEDICINES AS USUAL:  REMERON / ZOCOR / GAVAPENTIN  IF YOU NEED TO TAKE FLEXERIL OR AMERGE PRIOR TO SURGERY YOU MAY DO SO.  YOU MAY TAKE TYLENOL AT ANY TIME  WEAR A LARGE LOOSE SHIRT ON THE DAY OF SURGERY.

## 2018-05-23 MED ORDER — CLINDAMYCIN PHOSPHATE 900 MG/50ML IV SOLN
900.0000 mg | Freq: Once | INTRAVENOUS | Status: AC
  Administered 2018-05-24: 900 mg via INTRAVENOUS

## 2018-05-24 ENCOUNTER — Encounter
Admission: RE | Disposition: A | Discharge status: Home / Self Care | Payer: Self-pay | Source: Ambulatory Visit | Attending: Surgery

## 2018-05-24 ENCOUNTER — Ambulatory Visit: Payer: BLUE CROSS/BLUE SHIELD | Admitting: Anesthesiology

## 2018-05-24 ENCOUNTER — Ambulatory Visit
Admission: RE | Admit: 2018-05-24 | Discharge: 2018-05-24 | Disposition: A | Discharge status: Home / Self Care | Payer: BLUE CROSS/BLUE SHIELD | Source: Ambulatory Visit | Attending: Surgery | Admitting: Surgery

## 2018-05-24 DIAGNOSIS — Z832 Family history of diseases of the blood and blood-forming organs and certain disorders involving the immune mechanism: Secondary | ICD-10-CM | POA: Insufficient documentation

## 2018-05-24 DIAGNOSIS — M7581 Other shoulder lesions, right shoulder: Secondary | ICD-10-CM | POA: Diagnosis not present

## 2018-05-24 DIAGNOSIS — Z881 Allergy status to other antibiotic agents status: Secondary | ICD-10-CM | POA: Insufficient documentation

## 2018-05-24 DIAGNOSIS — Z7982 Long term (current) use of aspirin: Secondary | ICD-10-CM | POA: Diagnosis not present

## 2018-05-24 DIAGNOSIS — Z96652 Presence of left artificial knee joint: Secondary | ICD-10-CM | POA: Insufficient documentation

## 2018-05-24 DIAGNOSIS — M722 Plantar fascial fibromatosis: Secondary | ICD-10-CM | POA: Insufficient documentation

## 2018-05-24 DIAGNOSIS — Z8612 Personal history of poliomyelitis: Secondary | ICD-10-CM | POA: Insufficient documentation

## 2018-05-24 DIAGNOSIS — R000 Tachycardia, unspecified: Secondary | ICD-10-CM | POA: Insufficient documentation

## 2018-05-24 DIAGNOSIS — E1140 Type 2 diabetes mellitus with diabetic neuropathy, unspecified: Secondary | ICD-10-CM | POA: Insufficient documentation

## 2018-05-24 DIAGNOSIS — K219 Gastro-esophageal reflux disease without esophagitis: Secondary | ICD-10-CM | POA: Diagnosis not present

## 2018-05-24 DIAGNOSIS — K5790 Diverticulosis of intestine, part unspecified, without perforation or abscess without bleeding: Secondary | ICD-10-CM | POA: Insufficient documentation

## 2018-05-24 DIAGNOSIS — Z91041 Radiographic dye allergy status: Secondary | ICD-10-CM | POA: Insufficient documentation

## 2018-05-24 DIAGNOSIS — Z825 Family history of asthma and other chronic lower respiratory diseases: Secondary | ICD-10-CM | POA: Insufficient documentation

## 2018-05-24 DIAGNOSIS — M24111 Other articular cartilage disorders, right shoulder: Secondary | ICD-10-CM | POA: Insufficient documentation

## 2018-05-24 DIAGNOSIS — Z886 Allergy status to analgesic agent status: Secondary | ICD-10-CM | POA: Insufficient documentation

## 2018-05-24 DIAGNOSIS — J45909 Unspecified asthma, uncomplicated: Secondary | ICD-10-CM | POA: Insufficient documentation

## 2018-05-24 DIAGNOSIS — Z823 Family history of stroke: Secondary | ICD-10-CM | POA: Insufficient documentation

## 2018-05-24 DIAGNOSIS — Z7951 Long term (current) use of inhaled steroids: Secondary | ICD-10-CM | POA: Insufficient documentation

## 2018-05-24 DIAGNOSIS — M7541 Impingement syndrome of right shoulder: Secondary | ICD-10-CM | POA: Diagnosis present

## 2018-05-24 DIAGNOSIS — Z87442 Personal history of urinary calculi: Secondary | ICD-10-CM | POA: Insufficient documentation

## 2018-05-24 DIAGNOSIS — D509 Iron deficiency anemia, unspecified: Secondary | ICD-10-CM | POA: Insufficient documentation

## 2018-05-24 DIAGNOSIS — I10 Essential (primary) hypertension: Secondary | ICD-10-CM | POA: Insufficient documentation

## 2018-05-24 DIAGNOSIS — M75121 Complete rotator cuff tear or rupture of right shoulder, not specified as traumatic: Secondary | ICD-10-CM | POA: Diagnosis not present

## 2018-05-24 DIAGNOSIS — G43909 Migraine, unspecified, not intractable, without status migrainosus: Secondary | ICD-10-CM | POA: Insufficient documentation

## 2018-05-24 DIAGNOSIS — Z888 Allergy status to other drugs, medicaments and biological substances status: Secondary | ICD-10-CM | POA: Insufficient documentation

## 2018-05-24 DIAGNOSIS — Z8249 Family history of ischemic heart disease and other diseases of the circulatory system: Secondary | ICD-10-CM | POA: Insufficient documentation

## 2018-05-24 DIAGNOSIS — Z820 Family history of epilepsy and other diseases of the nervous system: Secondary | ICD-10-CM | POA: Insufficient documentation

## 2018-05-24 DIAGNOSIS — K760 Fatty (change of) liver, not elsewhere classified: Secondary | ICD-10-CM | POA: Diagnosis not present

## 2018-05-24 DIAGNOSIS — Z79899 Other long term (current) drug therapy: Secondary | ICD-10-CM | POA: Diagnosis not present

## 2018-05-24 DIAGNOSIS — E785 Hyperlipidemia, unspecified: Secondary | ICD-10-CM | POA: Diagnosis not present

## 2018-05-24 DIAGNOSIS — M19011 Primary osteoarthritis, right shoulder: Secondary | ICD-10-CM | POA: Insufficient documentation

## 2018-05-24 DIAGNOSIS — E559 Vitamin D deficiency, unspecified: Secondary | ICD-10-CM | POA: Insufficient documentation

## 2018-05-24 DIAGNOSIS — Z818 Family history of other mental and behavioral disorders: Secondary | ICD-10-CM | POA: Insufficient documentation

## 2018-05-24 DIAGNOSIS — E538 Deficiency of other specified B group vitamins: Secondary | ICD-10-CM | POA: Diagnosis not present

## 2018-05-24 DIAGNOSIS — G2581 Restless legs syndrome: Secondary | ICD-10-CM | POA: Insufficient documentation

## 2018-05-24 DIAGNOSIS — Z811 Family history of alcohol abuse and dependence: Secondary | ICD-10-CM | POA: Insufficient documentation

## 2018-05-24 DIAGNOSIS — F419 Anxiety disorder, unspecified: Secondary | ICD-10-CM | POA: Insufficient documentation

## 2018-05-24 DIAGNOSIS — E7800 Pure hypercholesterolemia, unspecified: Secondary | ICD-10-CM | POA: Insufficient documentation

## 2018-05-24 DIAGNOSIS — Z8042 Family history of malignant neoplasm of prostate: Secondary | ICD-10-CM | POA: Insufficient documentation

## 2018-05-24 DIAGNOSIS — Z9049 Acquired absence of other specified parts of digestive tract: Secondary | ICD-10-CM | POA: Insufficient documentation

## 2018-05-24 DIAGNOSIS — S46101A Unspecified injury of muscle, fascia and tendon of long head of biceps, right arm, initial encounter: Secondary | ICD-10-CM | POA: Insufficient documentation

## 2018-05-24 HISTORY — PX: SHOULDER ARTHROSCOPY WITH OPEN ROTATOR CUFF REPAIR: SHX6092

## 2018-05-24 LAB — GLUCOSE, CAPILLARY
Glucose-Capillary: 121 mg/dL — ABNORMAL HIGH (ref 70–99)
Glucose-Capillary: 156 mg/dL — ABNORMAL HIGH (ref 70–99)

## 2018-05-24 SURGERY — ARTHROSCOPY, SHOULDER WITH REPAIR, ROTATOR CUFF, OPEN
Anesthesia: General | Laterality: Right

## 2018-05-24 MED ORDER — METOCLOPRAMIDE HCL 10 MG PO TABS: 5.0000 mg | ORAL_TABLET | Freq: Three times a day (TID) | ORAL | Status: DC | PRN

## 2018-05-24 MED ORDER — EPINEPHRINE PF 1 MG/ML IJ SOLN
INTRAMUSCULAR | Status: AC
  Filled 2018-05-24: qty 2

## 2018-05-24 MED ORDER — SUGAMMADEX SODIUM 200 MG/2ML IV SOLN
INTRAVENOUS | Status: AC
  Filled 2018-05-24: qty 2

## 2018-05-24 MED ORDER — POTASSIUM CHLORIDE IN NACL 20-0.9 MEQ/L-% IV SOLN
INTRAVENOUS | Status: DC
  Filled 2018-05-24 (×3): qty 1000

## 2018-05-24 MED ORDER — ONDANSETRON HCL 4 MG PO TABS
4.0000 mg | ORAL_TABLET | Freq: Four times a day (QID) | ORAL | Status: DC | PRN
Start: 2018-05-24 — End: 2018-05-24

## 2018-05-24 MED ORDER — MIDAZOLAM HCL 2 MG/2ML IJ SOLN
1.0000 mg | Freq: Once | INTRAMUSCULAR | Status: AC
  Administered 2018-05-24: 1 mg via INTRAVENOUS

## 2018-05-24 MED ORDER — FENTANYL CITRATE (PF) 100 MCG/2ML IJ SOLN
INTRAMUSCULAR | Status: DC | PRN
  Administered 2018-05-24: 50 ug via INTRAVENOUS
  Administered 2018-05-24 (×2): 25 ug via INTRAVENOUS

## 2018-05-24 MED ORDER — SODIUM CHLORIDE 0.9 % IV SOLN
INTRAVENOUS | Status: DC
  Administered 2018-05-24: 09:00:00 via INTRAVENOUS

## 2018-05-24 MED ORDER — MIDAZOLAM HCL 2 MG/2ML IJ SOLN
INTRAMUSCULAR | Status: AC
  Administered 2018-05-24: 1 mg via INTRAVENOUS
  Filled 2018-05-24: qty 2

## 2018-05-24 MED ORDER — BUPIVACAINE LIPOSOME 1.3 % IJ SUSP
INTRAMUSCULAR | Status: DC | PRN
  Administered 2018-05-24: 20 mL via PERINEURAL

## 2018-05-24 MED ORDER — SUGAMMADEX SODIUM 200 MG/2ML IV SOLN
INTRAVENOUS | Status: DC | PRN
  Administered 2018-05-24: 206 mg via INTRAVENOUS

## 2018-05-24 MED ORDER — OXYCODONE HCL 5 MG PO TABS
5.0000 mg | ORAL_TABLET | ORAL | Status: DC | PRN
  Filled 2018-05-24: qty 2

## 2018-05-24 MED ORDER — FENTANYL CITRATE (PF) 100 MCG/2ML IJ SOLN: 25.0000 ug | INTRAMUSCULAR | Status: DC | PRN

## 2018-05-24 MED ORDER — BUPIVACAINE-EPINEPHRINE (PF) 0.5% -1:200000 IJ SOLN
INTRAMUSCULAR | Status: AC
  Filled 2018-05-24: qty 30

## 2018-05-24 MED ORDER — DEXAMETHASONE SODIUM PHOSPHATE 10 MG/ML IJ SOLN
INTRAMUSCULAR | Status: DC | PRN
  Administered 2018-05-24: 10 mg via INTRAVENOUS

## 2018-05-24 MED ORDER — PROPOFOL 10 MG/ML IV BOLUS
INTRAVENOUS | Status: AC
  Filled 2018-05-24: qty 20

## 2018-05-24 MED ORDER — METOCLOPRAMIDE HCL 5 MG/ML IJ SOLN: 5.0000 mg | Freq: Three times a day (TID) | INTRAMUSCULAR | Status: DC | PRN

## 2018-05-24 MED ORDER — LACTATED RINGERS IV SOLN
INTRAVENOUS | Status: DC | PRN
  Administered 2018-05-24: 12:00:00 via INTRAVENOUS

## 2018-05-24 MED ORDER — BUPIVACAINE HCL (PF) 0.5 % IJ SOLN
INTRAMUSCULAR | Status: DC | PRN
  Administered 2018-05-24: 10 mL via PERINEURAL

## 2018-05-24 MED ORDER — ONDANSETRON HCL 4 MG/2ML IJ SOLN
INTRAMUSCULAR | Status: DC | PRN
  Administered 2018-05-24: 4 mg via INTRAVENOUS

## 2018-05-24 MED ORDER — BUPIVACAINE HCL (PF) 0.5 % IJ SOLN
INTRAMUSCULAR | Status: AC
  Filled 2018-05-24: qty 10

## 2018-05-24 MED ORDER — BUPIVACAINE LIPOSOME 1.3 % IJ SUSP
INTRAMUSCULAR | Status: AC
  Filled 2018-05-24: qty 20

## 2018-05-24 MED ORDER — FENTANYL CITRATE (PF) 100 MCG/2ML IJ SOLN
50.0000 ug | Freq: Once | INTRAMUSCULAR | Status: AC
  Administered 2018-05-24: 50 ug via INTRAVENOUS

## 2018-05-24 MED ORDER — SUCCINYLCHOLINE CHLORIDE 20 MG/ML IJ SOLN
INTRAMUSCULAR | Status: DC | PRN
  Administered 2018-05-24: 120 mg via INTRAVENOUS

## 2018-05-24 MED ORDER — ONDANSETRON HCL 4 MG/2ML IJ SOLN: 4.0000 mg | Freq: Four times a day (QID) | INTRAMUSCULAR | Status: DC | PRN

## 2018-05-24 MED ORDER — PROMETHAZINE HCL 25 MG/ML IJ SOLN: 6.2500 mg | INTRAMUSCULAR | Status: DC | PRN

## 2018-05-24 MED ORDER — OXYCODONE HCL 5 MG PO TABS: 5.0000 mg | ORAL_TABLET | ORAL | 0 refills | Status: DC | PRN

## 2018-05-24 MED ORDER — BUPIVACAINE-EPINEPHRINE 0.5% -1:200000 IJ SOLN
INTRAMUSCULAR | Status: DC | PRN
  Administered 2018-05-24: 30 mL

## 2018-05-24 MED ORDER — PROPOFOL 10 MG/ML IV BOLUS
INTRAVENOUS | Status: DC | PRN
  Administered 2018-05-24: 150 mg via INTRAVENOUS

## 2018-05-24 MED ORDER — LIDOCAINE HCL (CARDIAC) PF 100 MG/5ML IV SOSY
PREFILLED_SYRINGE | INTRAVENOUS | Status: DC | PRN
  Administered 2018-05-24: 100 mg via INTRAVENOUS

## 2018-05-24 MED ORDER — ROCURONIUM BROMIDE 100 MG/10ML IV SOLN
INTRAVENOUS | Status: DC | PRN
  Administered 2018-05-24: 30 mg via INTRAVENOUS
  Administered 2018-05-24: 40 mg via INTRAVENOUS
  Administered 2018-05-24: 10 mg via INTRAVENOUS

## 2018-05-24 MED ORDER — LIDOCAINE HCL (PF) 1 % IJ SOLN
INTRAMUSCULAR | Status: AC
  Filled 2018-05-24: qty 5

## 2018-05-24 MED ORDER — CLINDAMYCIN PHOSPHATE 900 MG/50ML IV SOLN
INTRAVENOUS | Status: AC
  Filled 2018-05-24: qty 50

## 2018-05-24 MED ORDER — IPRATROPIUM-ALBUTEROL 0.5-2.5 (3) MG/3ML IN SOLN
3.0000 mL | Freq: Once | RESPIRATORY_TRACT | Status: AC
  Administered 2018-05-24: 3 mL via RESPIRATORY_TRACT

## 2018-05-24 MED ORDER — MIDAZOLAM HCL 2 MG/2ML IJ SOLN
INTRAMUSCULAR | Status: DC | PRN
  Administered 2018-05-24: 2 mg via INTRAVENOUS

## 2018-05-24 MED ORDER — SODIUM CHLORIDE 0.9 % IV SOLN
INTRAVENOUS | Status: DC | PRN
  Administered 2018-05-24: 40 ug/min via INTRAVENOUS

## 2018-05-24 MED ORDER — LACTATED RINGERS IV SOLN
INTRAVENOUS | Status: DC | PRN
  Administered 2018-05-24: 2 mL

## 2018-05-24 MED ORDER — IPRATROPIUM-ALBUTEROL 0.5-2.5 (3) MG/3ML IN SOLN
RESPIRATORY_TRACT | Status: AC
  Administered 2018-05-24: 3 mL via RESPIRATORY_TRACT
  Filled 2018-05-24: qty 3

## 2018-05-24 MED ORDER — FENTANYL CITRATE (PF) 100 MCG/2ML IJ SOLN
INTRAMUSCULAR | Status: AC
  Administered 2018-05-24: 50 ug via INTRAVENOUS
  Filled 2018-05-24: qty 2

## 2018-05-24 MED ORDER — MIDAZOLAM HCL 2 MG/2ML IJ SOLN
INTRAMUSCULAR | Status: AC
  Filled 2018-05-24: qty 2

## 2018-05-24 MED ORDER — FENTANYL CITRATE (PF) 100 MCG/2ML IJ SOLN
INTRAMUSCULAR | Status: AC
  Filled 2018-05-24: qty 2

## 2018-05-24 MED ORDER — LIDOCAINE HCL (PF) 1 % IJ SOLN
INTRAMUSCULAR | Status: DC | PRN
  Administered 2018-05-24: 5 mL via SUBCUTANEOUS

## 2018-05-24 SURGICAL SUPPLY — 48 items
4.0mm bone cutter ×3 IMPLANT
ANCHOR JUGGERKNOT WTAP NDL 2.9 (Anchor) ×9 IMPLANT
ANCHOR SUT QUATTRO KNTLS 4.5 (Anchor) ×3 IMPLANT
BIT DRILL JUGRKNT W/NDL BIT2.9 (DRILL) ×1 IMPLANT
BLADE FULL RADIUS 3.5 (BLADE) IMPLANT
BUR ACROMIONIZER 4.0 (BURR) IMPLANT
BURR OVAL 8 FLU 4.0MM X 13CM (MISCELLANEOUS) ×1
BURR OVAL 8 FLU 4.0X13 (MISCELLANEOUS) ×2 IMPLANT
CANNULA SHAVER 8MMX76MM (CANNULA) ×3 IMPLANT
CHLORAPREP W/TINT 26ML (MISCELLANEOUS) ×3 IMPLANT
COVER MAYO STAND STRL (DRAPES) ×3 IMPLANT
CUTTER BONE 4.0MM X 13CM (MISCELLANEOUS) ×2 IMPLANT
DRAPE IMP U-DRAPE 54X76 (DRAPES) ×6 IMPLANT
DRILL JUGGERKNOT W/NDL BIT 2.9 (DRILL) ×3
ELECT REM PT RETURN 9FT ADLT (ELECTROSURGICAL) ×3
ELECTRODE REM PT RTRN 9FT ADLT (ELECTROSURGICAL) ×1 IMPLANT
GAUZE PETRO XEROFOAM 1X8 (MISCELLANEOUS) ×3 IMPLANT
GAUZE SPONGE 4X4 12PLY STRL (GAUZE/BANDAGES/DRESSINGS) ×3 IMPLANT
GLOVE BIO SURGEON STRL SZ7.5 (GLOVE) ×6 IMPLANT
GLOVE BIO SURGEON STRL SZ8 (GLOVE) ×6 IMPLANT
GLOVE BIOGEL PI IND STRL 8 (GLOVE) ×1 IMPLANT
GLOVE BIOGEL PI INDICATOR 8 (GLOVE) ×2
GLOVE INDICATOR 8.0 STRL GRN (GLOVE) ×3 IMPLANT
GOWN STRL REUS W/ TWL LRG LVL3 (GOWN DISPOSABLE) ×1 IMPLANT
GOWN STRL REUS W/ TWL XL LVL3 (GOWN DISPOSABLE) ×1 IMPLANT
GOWN STRL REUS W/TWL LRG LVL3 (GOWN DISPOSABLE) ×2
GOWN STRL REUS W/TWL XL LVL3 (GOWN DISPOSABLE) ×2
GRASPER SUT 15 45D LOW PRO (SUTURE) IMPLANT
IV LACTATED RINGER IRRG 3000ML (IV SOLUTION) ×4
IV LR IRRIG 3000ML ARTHROMATIC (IV SOLUTION) ×2 IMPLANT
MANIFOLD NEPTUNE II (INSTRUMENTS) ×3 IMPLANT
MASK FACE SPIDER DISP (MASK) ×3 IMPLANT
MAT BLUE FLOOR 46X72 FLO (MISCELLANEOUS) ×3 IMPLANT
PACK ARTHROSCOPY SHOULDER (MISCELLANEOUS) ×3 IMPLANT
PORT APPOLLO RF 90DEGREE MULTI (SURGICAL WAND) ×3 IMPLANT
SLING ARM LRG DEEP (SOFTGOODS) IMPLANT
SLING ULTRA II LG (MISCELLANEOUS) ×3 IMPLANT
STAPLER SKIN PROX 35W (STAPLE) ×3 IMPLANT
STRAP SAFETY 5IN WIDE (MISCELLANEOUS) ×3 IMPLANT
SUT ETHIBOND 0 MO6 C/R (SUTURE) ×3 IMPLANT
SUT VIC AB 2-0 CT1 27 (SUTURE) ×4
SUT VIC AB 2-0 CT1 TAPERPNT 27 (SUTURE) ×2 IMPLANT
TAPE MICROFOAM 4IN (TAPE) ×3 IMPLANT
TUBING ARTHRO INFLOW-ONLY STRL (TUBING) IMPLANT
TUBING CONNECTING 10 (TUBING) ×2 IMPLANT
TUBING CONNECTING 10' (TUBING) ×1
TUBING REDEUCE PAT W/CON 8IN (MISCELLANEOUS) ×3 IMPLANT
WAND HAND CNTRL MULTIVAC 90 (MISCELLANEOUS) IMPLANT

## 2018-05-24 NOTE — Anesthesia Preprocedure Evaluation (Signed)
Anesthesia Evaluation  Patient identified by MRN, date of birth, ID band Patient awake    Reviewed: Allergy & Precautions, NPO status , Patient's Chart, lab work & pertinent test results  History of Anesthesia Complications Negative for: history of anesthetic complications  Airway Mallampati: III       Dental  (+) Teeth Intact, Dental Advidsory Given   Pulmonary neg shortness of breath, asthma , neg sleep apnea, neg recent URI,           Cardiovascular Exercise Tolerance: Good hypertension, Pt. on home beta blockers (-) angina(-) CAD, (-) Past MI, (-) Cardiac Stents and (-) CABG (-) dysrhythmias (-) Valvular Problems/Murmurs     Neuro/Psych  Headaches, neg Seizures PSYCHIATRIC DISORDERS Anxiety    GI/Hepatic Neg liver ROS, GERD  Medicated,  Endo/Other  diabetes, Type 2, Oral Hypoglycemic AgentsMorbid obesity  Renal/GU negative Renal ROS     Musculoskeletal   Abdominal (+)  Abdomen: soft.    Peds  Hematology   Anesthesia Other Findings Past Medical History: No date: Anemia     Comment:  iron deficiency and b12 deficiency No date: Anxiety No date: Arthritis No date: Asthma No date: Diabetes mellitus without complication (HCC) No date: GERD (gastroesophageal reflux disease) No date: History of kidney stones No date: Hypercholesterolemia No date: Hypertension No date: Migraines     Comment:  MIGRAINES HAVE IMPROVED SINCE RECEIVING IRON   Reproductive/Obstetrics negative OB ROS                             Anesthesia Physical  Anesthesia Plan  ASA: III  Anesthesia Plan: General   Post-op Pain Management:  Regional for Post-op pain   Induction: Intravenous  PONV Risk Score and Plan: 3 and Ondansetron and Dexamethasone  Airway Management Planned: Oral ETT  Additional Equipment:   Intra-op Plan:   Post-operative Plan: Extubation in OR  Informed Consent: I have reviewed  the patients History and Physical, chart, labs and discussed the procedure including the risks, benefits and alternatives for the proposed anesthesia with the patient or authorized representative who has indicated his/her understanding and acceptance.     Plan Discussed with: CRNA  Anesthesia Plan Comments:         Anesthesia Quick Evaluation

## 2018-05-24 NOTE — H&P (Signed)
Paper H&P to be scanned into permanent record. H&P reviewed and patient re-examined. No changes. 

## 2018-05-24 NOTE — Op Note (Signed)
05/24/2018  12:34 PM  Patient:   Andrea Zhang  Pre-Op Diagnosis:   Impingement/tendinopathy with nontraumatic large full-thickness rotator cuff tear, right shoulder.  Post-Op Diagnosis:   Impingement/tendinopathy with large full-thickness rotator cuff tear, labral fraying, early degenerative joint disease, and biceps tendinopathy, right shoulder.  Procedure:   Extensive arthroscopic debridement, arthroscopic subacromial decompression, mini-open rotator cuff repair, and mini-open biceps tenodesis, right shoulder.  Anesthesia:   General endotracheal with interscalene block using Exparel placed preoperatively by the anesthesiologist.  Surgeon:   Maryagnes AmosJ. Jeffrey Junaid Wurzer, MD  Assistant:   Janell QuietLance McGee, PA-C  Findings:   As above. The rotator cuff tear involve the entire insertional fibers of the supraspinatus and infraspinatus tendons, as well as the superior insertional fibers of the subscapularis tendon. The biceps tendon demonstrated significant tendinopathic changes with partial-thickness tearing. There were grade 3 chondromalacial changes involving the anterosuperior portion of the humeral head. The glenoid articular surface, as well as the central articular surface of the humerus both were in satisfactory condition. There was moderate fraying of the labrum anteriorly and superiorly without frank detachment from the glenoid.  Complications:   None  Fluids:   1000 cc  Estimated blood loss:   20 cc  Tourniquet time:   None  Drains:   None  Closure:   Staples      Brief clinical note:   The patient is a 57 year old female with a long history of gradually worsening right shoulder pain. The patient's symptoms have progressed despite medications, activity modification, etc. The patient's history and examination are consistent with impingement/tendinopathy with a rotator cuff tear. These findings were confirmed by MRI scan. The patient presents at this time for definitive management of these  shoulder symptoms.  Procedure:   The patient underwent placement of an interscalene block using Exparel by the anesthesiologist in the preoperative holding area before being brought into the operating room and lain in the supine position. The patient then underwent general endotracheal intubation and anesthesia before being repositioned in the beach chair position using the beach chair positioner. The right shoulder and upper extremity were prepped with ChloraPrep solution before being draped sterilely. Preoperative antibiotics were administered. A timeout was performed to confirm the proper surgical site before the expected portal sites and incision site were injected with 0.5% Sensorcaine with epinephrine. A posterior portal was created and the glenohumeral joint thoroughly inspected with the findings as described above. An anterior portal was created using an outside-in technique. The labrum and rotator cuff were further probed, again confirming the above-noted findings. The areas of labral fraying were debrided back to stable margins using the full-radius resector. In addition, areas of synovitis, the torn/frayed margins of the rotator cuff, and the areas of grade 3 chondromalacia on the humeral head all were debrided back to stable margins using the full-radius resector. The ArthroCare wand was inserted and used to release the biceps tendon from its labral anchor. It also was used to obtain hemostasis as well as to "anneal" the labrum superiorly and anteriorly. The instruments were removed from the joint after suctioning the excess fluid.  The camera was repositioned through the posterior portal into the subacromial space. A separate lateral portal was created using an outside-in technique. The 3.5 mm full-radius resector was introduced and used to perform a subtotal bursectomy. The ArthroCare wand was then inserted and used to remove the periosteal tissue off the undersurface of the anterior third of the  acromion as well as to recess the coracoacromial ligament from  its attachment along the anterior and lateral margins of the acromion. The 4.0 mm acromionizing bur was introduced and used to complete the decompression by removing the undersurface of the anterior third of the acromion. The full radius resector was reintroduced to remove any residual bony debris before the ArthroCare wand was reintroduced to obtain hemostasis. The instruments were then removed from the subacromial space after suctioning the excess fluid.  An approximately 4-5 cm incision was made over the anterolateral aspect of the shoulder beginning at the anterolateral corner of the acromion and extending distally in line with the bicipital groove. This incision was carried down through the subcutaneous tissues to expose the deltoid fascia. The raphae between the anterior and middle thirds was identified and this plane developed to provide access into the subacromial space. Additional bursal tissues were debrided sharply using Metzenbaum scissors. The rotator cuff tear was readily identified. The margins were debrided sharply with a #15 blade and the exposed greater tuberosity roughened with a rongeur. The tear was repaired using two Biomet 2.9 mm JuggerKnot anchors. These sutures were then brought back laterally and secured using one [place] QuatroLink anchor to create a two-layer closure. An apparent watertight closure was obtained.  The bicipital groove was identified by palpation and opened for 1-1.5 cm. The biceps tendon stump was retrieved through this defect. The floor of the bicipital groove was roughened with a curet before another Biomet 2.9 mm JuggerKnot anchor was inserted. Both sets of sutures were passed through the biceps tendon and tied securely to effect the tenodesis. The bicipital sheath was reapproximated using two #0 Ethibond interrupted sutures, incorporating the biceps tendon to further reinforce the tenodesis.  The  wound was copiously irrigated with sterile saline solution before the deltoid raphae was reapproximated using 2-0 Vicryl interrupted sutures. The subcutaneous tissues were closed in two layers using 2-0 Vicryl interrupted sutures before the skin was closed using staples. The portal sites also were closed using staples. A sterile bulky dressing was applied to the shoulder before the arm was placed into a shoulder immobilizer. The patient was then awakened, extubated, and returned to the recovery room in satisfactory condition after tolerating the procedure well.

## 2018-05-24 NOTE — Anesthesia Post-op Follow-up Note (Signed)
Anesthesia QCDR form completed.        

## 2018-05-24 NOTE — Transfer of Care (Signed)
Immediate Anesthesia Transfer of Care Note  Patient: Andrea GarfinkelMaria Zhang  Procedure(s) Performed: right shoulder arthroscopy, extensive arthroscopic debridement, decompression, open rotator cuff repair, biceps tenodesis (Right )  Patient Location: PACU  Anesthesia Type:General  Level of Consciousness: sedated  Airway & Oxygen Therapy: Patient Spontanous Breathing and Patient connected to face mask oxygen  Post-op Assessment: Report given to RN and Post -op Vital signs reviewed and stable  Post vital signs: Reviewed and stable  Last Vitals:  Vitals Value Taken Time  BP 116/76 05/24/2018  1:00 PM  Temp 36 C 05/24/2018  1:00 PM  Pulse 64 05/24/2018  1:00 PM  Resp 17 05/24/2018  1:00 PM  SpO2 95 % 05/24/2018  1:00 PM  Vitals shown include unvalidated device data.  Last Pain:  Vitals:   05/24/18 1012  TempSrc:   PainSc: 0-No pain         Complications: No apparent anesthesia complications

## 2018-05-24 NOTE — Discharge Instructions (Addendum)
Orthopedic discharge instructions: Keep dressing dry and intact.  May remove dressing and sponge bathe starting Monday.  Cover staples with Band-Aids after drying off. Apply ice frequently to shoulder. Take ibuprofen 600-800 mg TID with meals OR etodolac 1 tablet BID with meals for 7-10 days, then as necessary. Take oxycodone as prescribed when needed.  May supplement with ES Tylenol if necessary. Keep shoulder immobilizer on at all times. Please hold off on starting physical therapy until 4 weeks post-op. Follow-up in 10-14 days or as scheduled.  Bupivacaine Liposomal Suspension for Injection What is this medicine? BUPIVACAINE LIPOSOMAL (bue PIV a kane LIP oh som al) is an anesthetic. It causes loss of feeling in the skin or other tissues. It is used to prevent and to treat pain from some procedures. This medicine may be used for other purposes; ask your health care provider or pharmacist if you have questions. COMMON BRAND NAME(S): EXPAREL What should I tell my health care provider before I take this medicine? They need to know if you have any of these conditions: -heart disease -kidney disease -liver disease -an unusual or allergic reaction to bupivacaine, other medicines, foods, dyes, or preservatives -pregnant or trying to get pregnant -breast-feeding How should I use this medicine? This medicine is for injection into the affected area. It is given by a health care professional in a hospital or clinic setting. Talk to your pediatrician regarding the use of this medicine in children. Special care may be needed. Overdosage: If you think you have taken too much of this medicine contact a poison control center or emergency room at once. NOTE: This medicine is only for you. Do not share this medicine with others. What if I miss a dose? This does not apply. What may interact with this medicine? -other anesthetics This list may not describe all possible interactions. Give your health  care provider a list of all the medicines, herbs, non-prescription drugs, or dietary supplements you use. Also tell them if you smoke, drink alcohol, or use illegal drugs. Some items may interact with your medicine. What should I watch for while using this medicine? Your condition will be monitored carefully while you are receiving this medicine. Be careful to avoid injury while the area is numb and you are not aware of pain. Do not use another medicine that contains bupivacaine for 96 hours after receiving bupivacaine liposomal. You may have more side effects. Talk to your doctor if you have questions. What side effects may I notice from receiving this medicine? Side effects that you should report to your doctor or health care professional as soon as possible: -allergic reactions like skin rash, itching or hives, swelling of the face, lips, or tongue -breathing problems -changes in vision -dizziness -fast or slow, irregular heartbeat -joint pain, stiffness, or loss of motion -seizures Side effects that usually do not require medical attention (report to your doctor or health care professional if they continue or are bothersome): -constipation -irritation at site where injected -nausea, vomiting -tiredness This list may not describe all possible side effects. Call your doctor for medical advice about side effects. You may report side effects to FDA at 1-800-FDA-1088. Where should I keep my medicine? This drug is given in a hospital or clinic and will not be stored at home. NOTE: This sheet is a summary. It may not cover all possible information. If you have questions about this medicine, talk to your doctor, pharmacist, or health care provider.  2018 Elsevier/Gold Standard (2015-11-26 09:03:52)  AMBULATORY SURGERY  DISCHARGE INSTRUCTIONS   1) The drugs that you were given will stay in your system until tomorrow so for the next 24 hours you should not:  A) Drive an automobile B) Make  any legal decisions C) Drink any alcoholic beverage   2) You may resume regular meals tomorrow.  Today it is better to start with liquids and gradually work up to solid foods.  You may eat anything you prefer, but it is better to start with liquids, then soup and crackers, and gradually work up to solid foods.   3) Please notify your doctor immediately if you have any unusual bleeding, trouble breathing, redness and pain at the surgery site, drainage, fever, or pain not relieved by medication.    4) Additional Instructions:        Please contact your physician with any problems or Same Day Surgery at [phone], Monday through Friday 6 am to 4 pm, or [place] at [facility] number at [phone].

## 2018-05-24 NOTE — Anesthesia Procedure Notes (Signed)
Procedure Name: Intubation Date/Time: 05/24/2018 10:45 AM Performed by: Nelda Marseille, CRNA Pre-anesthesia Checklist: Patient identified, Patient being monitored, Timeout performed, Emergency Drugs available and Suction available Patient Re-evaluated:Patient Re-evaluated prior to induction Oxygen Delivery Method: Circle system utilized Preoxygenation: Pre-oxygenation with 100% oxygen Induction Type: IV induction Ventilation: Mask ventilation without difficulty Laryngoscope Size: Mac, 3 and McGraph Grade View: Grade II Tube type: Oral Tube size: 7.0 mm Number of attempts: 1 Airway Equipment and Method: Stylet Placement Confirmation: ETT inserted through vocal cords under direct vision,  positive ETCO2 and breath sounds checked- equal and bilateral Secured at: 21 cm Tube secured with: Tape Dental Injury: Teeth and Oropharynx as per pre-operative assessment

## 2018-05-24 NOTE — Anesthesia Procedure Notes (Signed)
Anesthesia Regional Block: Interscalene brachial plexus block   Pre-Anesthetic Checklist: ,, timeout performed, Correct Patient, Correct Site, Correct Laterality, Correct Procedure, Correct Position, site marked, Risks and benefits discussed,  Surgical consent,  Pre-op evaluation,  At surgeon's request and post-op pain management  Laterality: Right and Upper  Prep: chloraprep       Needles:  Injection technique: Single-shot  Needle Type: Stimiplex     Needle Length: 5cm  Needle Gauge: 22     Additional Needles:   Procedures:,,,, ultrasound used (permanent image in chart),,,,  Narrative:  Start time: 05/24/2018 9:41 AM End time: 05/24/2018 9:46 AM Injection made incrementally with aspirations every 5 mL.  Performed by: Personally  Anesthesiologist: Lenard SimmerKarenz, Jersie Beel, MD  Additional Notes: Functioning IV was confirmed and monitors were applied.  A 50mm 22ga Stimuplex needle was used. Sterile prep and drape,hand hygiene and sterile gloves were used.  Negative aspiration and negative test dose prior to incremental administration of local anesthetic. The patient tolerated the procedure well.

## 2018-05-26 NOTE — Anesthesia Postprocedure Evaluation (Signed)
Anesthesia Post Note  Patient: Andrea GarfinkelMaria Cueto  Procedure(s) Performed: right shoulder arthroscopy, extensive arthroscopic debridement, decompression, open rotator cuff repair, biceps tenodesis (Right )  Patient location during evaluation: PACU Anesthesia Type: General Level of consciousness: awake and alert Pain management: pain level controlled Vital Signs Assessment: post-procedure vital signs reviewed and stable Respiratory status: spontaneous breathing, nonlabored ventilation, respiratory function stable and patient connected to nasal cannula oxygen Cardiovascular status: blood pressure returned to baseline and stable Postop Assessment: no apparent nausea or vomiting Anesthetic complications: no     Last Vitals:  Vitals:   05/24/18 1427 05/24/18 1449  BP: 103/66 118/75  Pulse: 62 67  Resp: 16 16  Temp: 36.7 C   SpO2:  94%    Last Pain:  Vitals:   05/24/18 1427  TempSrc: Tympanic  PainSc: 0-No pain                 Lenard SimmerAndrew Nakeesha Bowler

## 2018-05-30 ENCOUNTER — Ambulatory Visit: Payer: BLUE CROSS/BLUE SHIELD | Admitting: Physical Therapy

## 2018-06-05 DIAGNOSIS — Z6841 Body Mass Index (BMI) 40.0 and over, adult: Secondary | ICD-10-CM | POA: Insufficient documentation

## 2018-06-05 DIAGNOSIS — Z9889 Other specified postprocedural states: Secondary | ICD-10-CM | POA: Insufficient documentation

## 2018-06-11 ENCOUNTER — Inpatient Hospital Stay: Payer: BLUE CROSS/BLUE SHIELD

## 2018-06-11 ENCOUNTER — Inpatient Hospital Stay: Payer: BLUE CROSS/BLUE SHIELD | Attending: Oncology

## 2018-06-11 VITALS — BP 112/77 | HR 83 | Temp 96.8°F | Resp 18

## 2018-06-11 DIAGNOSIS — D509 Iron deficiency anemia, unspecified: Secondary | ICD-10-CM | POA: Diagnosis not present

## 2018-06-11 DIAGNOSIS — Z794 Long term (current) use of insulin: Secondary | ICD-10-CM | POA: Insufficient documentation

## 2018-06-11 DIAGNOSIS — E538 Deficiency of other specified B group vitamins: Secondary | ICD-10-CM | POA: Diagnosis not present

## 2018-06-11 DIAGNOSIS — Z79899 Other long term (current) drug therapy: Secondary | ICD-10-CM | POA: Insufficient documentation

## 2018-06-11 DIAGNOSIS — E119 Type 2 diabetes mellitus without complications: Secondary | ICD-10-CM | POA: Insufficient documentation

## 2018-06-11 DIAGNOSIS — Z8042 Family history of malignant neoplasm of prostate: Secondary | ICD-10-CM | POA: Insufficient documentation

## 2018-06-11 LAB — IRON AND TIBC
Iron: 81 ug/dL (ref 28–170)
Saturation Ratios: 21 % (ref 10.4–31.8)
TIBC: 384 ug/dL (ref 250–450)
UIBC: 303 ug/dL

## 2018-06-11 LAB — FERRITIN: Ferritin: 39 ng/mL (ref 11–307)

## 2018-06-11 LAB — VITAMIN B12: Vitamin B-12: 1727 pg/mL — ABNORMAL HIGH (ref 180–914)

## 2018-06-11 LAB — CBC
HEMATOCRIT: 41.6 % (ref 35.0–47.0)
Hemoglobin: 13.6 g/dL (ref 12.0–16.0)
MCH: 28.4 pg (ref 26.0–34.0)
MCHC: 32.7 g/dL (ref 32.0–36.0)
MCV: 87 fL (ref 80.0–100.0)
Platelets: 333 10*3/uL (ref 150–440)
RBC: 4.78 MIL/uL (ref 3.80–5.20)
RDW: 16 % — AB (ref 11.5–14.5)
WBC: 6.4 10*3/uL (ref 3.6–11.0)

## 2018-06-11 MED ORDER — CYANOCOBALAMIN 1000 MCG/ML IJ SOLN
1000.0000 ug | INTRAMUSCULAR | Status: DC
  Administered 2018-06-11: 1000 ug via INTRAMUSCULAR
  Filled 2018-06-11: qty 1

## 2018-06-25 ENCOUNTER — Ambulatory Visit: Payer: BLUE CROSS/BLUE SHIELD | Admitting: Physical Therapy

## 2018-06-25 ENCOUNTER — Encounter: Payer: Self-pay | Admitting: Physical Therapy

## 2018-06-25 ENCOUNTER — Ambulatory Visit: Payer: BLUE CROSS/BLUE SHIELD | Attending: Rheumatology | Admitting: Physical Therapy

## 2018-06-25 DIAGNOSIS — M25611 Stiffness of right shoulder, not elsewhere classified: Secondary | ICD-10-CM | POA: Diagnosis present

## 2018-06-25 DIAGNOSIS — M6281 Muscle weakness (generalized): Secondary | ICD-10-CM | POA: Diagnosis present

## 2018-06-25 DIAGNOSIS — M25511 Pain in right shoulder: Secondary | ICD-10-CM | POA: Diagnosis present

## 2018-06-25 DIAGNOSIS — G8929 Other chronic pain: Secondary | ICD-10-CM

## 2018-06-25 NOTE — Patient Instructions (Signed)
Access Code: [email or web address]  URL: [email or web address]  Date: 06/25/2018  Prepared by: Dorene GrebeMichael Layia Walla   Exercises  Flexion-Extension Shoulder Pendulum with Table Support - 3 reps - 1 sets - 30 hold - 1x daily - 7x weekly  Seated Shoulder Flexion AAROM with Pulley Behind - 20 reps - 1 sets - 3 hold - 3x daily - 7x weekly  Seated Shoulder Abduction AAROM with Pulley Behind - 20 reps - 1 sets - 3 hold - 3x daily - 7x weekly

## 2018-06-25 NOTE — Therapy (Signed)
[facility] [address] [place], [place], [address] Phone: [phone]   Fax:  [phone]  Physical Therapy Evaluation  Patient Details  Name: Andrea GarfinkelMaria Zhang MRN: [id number] Date of Birth: Feb 18, 1961 Referring Provider: Dr. Leron CroakJohn Poggi   Encounter Date: 06/25/2018    Treatment: 1 of 16.  Recert date: 08/20/18   Past Medical History:  Diagnosis Date  . Anemia    iron deficiency and b12 deficiency  . Anxiety   . Arthritis   . Asthma   . Diabetes mellitus without complication (HCC)   . GERD (gastroesophageal reflux disease)   . History of kidney stones   . Hypercholesterolemia   . Hypertension   . Migraines    MIGRAINES HAVE IMPROVED SINCE RECEIVING IRON    Past Surgical History:  Procedure Laterality Date  . CHOLECYSTECTOMY  2004  . COLONOSCOPY WITH ESOPHAGOGASTRODUODENOSCOPY (EGD)  02/2018  . ESOPHAGOGASTRODUODENOSCOPY (EGD) WITH PROPOFOL N/A 06/06/2016   Procedure: ESOPHAGOGASTRODUODENOSCOPY (EGD) WITH PROPOFOL;  Surgeon: Scot Junobert T Elliott, MD;  [facility];  Service: Endoscopy;  Laterality: N/A;  . EXTRACORPOREAL SHOCK WAVE LITHOTRIPSY  2010  . JOINT REPLACEMENT  2013   LT TKR  . SAVORY DILATION N/A 06/06/2016   Procedure: SAVORY DILATION;  Surgeon: Scot Junobert T Elliott, MD;  [facility];  Service: Endoscopy;  Laterality: N/A;  . SAVORY DILATION  02/2018  . SHOULDER ARTHROSCOPY WITH OPEN ROTATOR CUFF REPAIR Right 05/24/2018   Procedure: right shoulder arthroscopy, extensive arthroscopic debridement, decompression, open rotator cuff repair, biceps tenodesis;  Surgeon: Christena FlakePoggi, John J, MD;  Location: [facility] ORS;  Service: Orthopedics;  Laterality: Right;    There were no vitals filed for this visit.     Pt. is pleasant 57 y.o. female seeking skilled therapy s/p R shoulder arthroscopy. Pt. currently walks into clinic with sling donned on R shoulder. Pt. reports she has been in R sling for 4+ weeks and  was advised to stay in sling for 6 weeks. Pt. was not given exercises by MD to perform before coming to therapy.       EVALUATION  Pain Present: 0/10 Best: 8/10 Worst: 8/10   Gait / Mobility Pt. Has good mobility in shoulder joint is able to achieve functional limits for most PROM.   ROM / MMT PROM Shoulder R L Flex  115 169    Abd  112 166 IR  51 WNL ER  50 WNL  Strength not assessed based on MD protocol   Special Tests   R L Grip Strength: 42.05 52.1      See HEP.  Supine R shoulder PROM all planes of movement.        PT Long Term Goals - 06/26/18 1713      PT LONG TERM GOAL #1   Title  Pt. independent with HEP to increase R shoulder AROM to WNL as compared to L shoulder to improve pain-free mobility with reaching.      Baseline  Supine R/L shoulder PROM: flexion (115/169 deg.), abduction (112/166 deg.), ER (50 deg./ WNL), IR (51 deg./ WNL).     Time  8    Period  Weeks    Status  New    Target Date  08/21/18      PT LONG TERM GOAL #2   Title  Pt. will increase FOTO to 66 to improve R shoulder mobilty.      Baseline  FOTO baseline 22 on 6/3    Time  8  Period  Weeks    Status  New    Target Date  08/21/18      PT LONG TERM GOAL #3   Title  Pt will decrease worst pain as reported on NPRS by at least 3 points in order to demonstrate clinically significant reduction in pain.     Baseline  Worst pain 8/10    Time  8    Period  Weeks    Status  New    Target Date  08/21/18      PT LONG TERM GOAL #4   Title  Pt will increase strength of shoulder flexion by at least 1/2 MMT grade in order to demonstrate improvement in strength and function     Baseline  Unable to assess at this time.    Time  8    Period  Weeks    Status  New    Target Date  08/21/18         Plan - 06/26/18 1731    Clinical Impression Statement  Pt. is pleasant 57 y.o. seeking therapy s/p shoulder surgery for repair of rotator cuff tear, debridement, and biceps tendon  repair.  Pt. currently demonstrates decrease strength that was not tested by MMT due to protocol, decreased PROM in R shoulder when compared to the L, and increased pain in R shoulder.  Supine R/L shoulder PROM: flexion (115/169 deg.), abduction (112/166 deg.), ER (50 deg./ WNL), IR (51 deg./ WNL).   Pt. describes no pain upon entering into the clinic however has taken pain medicine this AM, and reports worse pain to be 8/10.  Pain is intermittent and resolves quickly  during PROM.  Incision site is well-healed and will [facility] to assist with movement of scar tissue.  Pt. will benefit from skilled threapy to decrease pain, increase ROM, and strength all per MD protocol.      Clinical Presentation  Stable    Clinical Decision Making  Low    Rehab Potential  Good    PT Frequency  2x / week    PT Duration  8 weeks    PT Treatment/Interventions  ADLs/Self Care Home Management;Cryotherapy;Electrical Stimulation;Moist Heat;Iontophoresis 4mg /ml Dexamethasone;Functional mobility training;Therapeutic activities;Therapeutic exercise;Patient/family education;Neuromuscular re-education;Manual techniques;Passive range of motion;Dry needling    PT Next Visit Plan  Manual therapy per MD protocol.    PT Home Exercise Plan  see HEP (handouts provided).      Consulted and Agree with Plan of Care  Patient       Patient will benefit from skilled therapeutic intervention in order to improve the following deficits and impairments:  Improper body mechanics, Pain, Postural dysfunction, Decreased mobility, Decreased activity tolerance, Decreased endurance, Decreased range of motion, Decreased strength, Hypomobility, Impaired UE functional use, Impaired flexibility  Visit Diagnosis: Chronic right shoulder pain  Muscle weakness (generalized)  Shoulder joint stiffness, right     Problem List Patient Active Problem List   Diagnosis Date Noted  . Iron deficiency anemia 01/15/2018   Andrea McgeeMichael C Sherk, PT, DPT  # [id number] Tomasa HoseJosh Sinia Antosh, SPT 06/27/2018, 10:24 AM  [facility] [address] [place], [place], [address] Phone: [phone]   Fax:  [phone]  Name: Andrea GarfinkelMaria Deakin MRN: [id number] Date of Birth: 1961-08-04

## 2018-06-27 ENCOUNTER — Ambulatory Visit: Payer: BLUE CROSS/BLUE SHIELD | Admitting: Physical Therapy

## 2018-06-27 DIAGNOSIS — M25611 Stiffness of right shoulder, not elsewhere classified: Secondary | ICD-10-CM

## 2018-06-27 DIAGNOSIS — M25511 Pain in right shoulder: Secondary | ICD-10-CM | POA: Diagnosis not present

## 2018-06-27 DIAGNOSIS — M6281 Muscle weakness (generalized): Secondary | ICD-10-CM

## 2018-06-27 DIAGNOSIS — G8929 Other chronic pain: Secondary | ICD-10-CM

## 2018-07-02 ENCOUNTER — Ambulatory Visit: Payer: BLUE CROSS/BLUE SHIELD | Admitting: Physical Therapy

## 2018-07-02 ENCOUNTER — Encounter: Payer: Self-pay | Admitting: Physical Therapy

## 2018-07-02 DIAGNOSIS — M25611 Stiffness of right shoulder, not elsewhere classified: Secondary | ICD-10-CM

## 2018-07-02 DIAGNOSIS — M25511 Pain in right shoulder: Secondary | ICD-10-CM | POA: Diagnosis not present

## 2018-07-02 DIAGNOSIS — M6281 Muscle weakness (generalized): Secondary | ICD-10-CM

## 2018-07-02 DIAGNOSIS — G8929 Other chronic pain: Secondary | ICD-10-CM

## 2018-07-02 NOTE — Therapy (Signed)
[facility] [address] [place], [place], [address] Phone: [phone]   Fax:  [phone]  Physical Therapy Treatment  Patient Details  Name: Andrea Zhang MRN: [id number] Date of Birth: [birth year] Referring Provider: Dr. Leron CroakJohn Poggi   Encounter Date: 06/27/2018  PT End of Session - 07/02/18 0728    Visit Number  2    Number of Visits  16    Date for PT Re-Evaluation  08/20/18    PT Start Time  1111    PT Stop Time  1201    PT Time Calculation (min)  50 min    Activity Tolerance  Patient limited by pain;Patient tolerated treatment well    Behavior During Therapy  [facility] for tasks assessed/performed       Past Medical History:  Diagnosis Date  . Anemia    iron deficiency and b12 deficiency  . Anxiety   . Arthritis   . Asthma   . Diabetes mellitus without complication (HCC)   . GERD (gastroesophageal reflux disease)   . History of kidney stones   . Hypercholesterolemia   . Hypertension   . Migraines    MIGRAINES HAVE IMPROVED SINCE RECEIVING IRON    Past Surgical History:  Procedure Laterality Date  . CHOLECYSTECTOMY  2004  . COLONOSCOPY WITH ESOPHAGOGASTRODUODENOSCOPY (EGD)  02/2018  . ESOPHAGOGASTRODUODENOSCOPY (EGD) WITH PROPOFOL N/A 06/06/2016   Procedure: ESOPHAGOGASTRODUODENOSCOPY (EGD) WITH PROPOFOL;  Surgeon: Scot Junobert T Elliott, MD;  [facility];  Service: Endoscopy;  Laterality: N/A;  . EXTRACORPOREAL SHOCK WAVE LITHOTRIPSY  2010  . JOINT REPLACEMENT  2013   LT TKR  . SAVORY DILATION N/A 06/06/2016   Procedure: SAVORY DILATION;  Surgeon: Scot Junobert T Elliott, MD;  [facility];  Service: Endoscopy;  Laterality: N/A;  . SAVORY DILATION  02/2018  . SHOULDER ARTHROSCOPY WITH OPEN ROTATOR CUFF REPAIR Right 05/24/2018   Procedure: right shoulder arthroscopy, extensive arthroscopic debridement, decompression, open rotator cuff repair, biceps tenodesis;  Surgeon: Christena FlakePoggi, John J, MD;  Location: [facility]  ORS;  Service: Orthopedics;  Laterality: Right;    There were no vitals filed for this visit.  Subjective Assessment - 07/02/18 0722    Subjective  No new complaints.  Good compliance with pulley/ HEP.      Pertinent History  s/p L TKA 5 years ago (no issues).  Decrease iron and c/o SOB currently.  See recent medical issues.  Pt. not sleeping well on R side due to pain.      Limitations  Lifting;Writing;House hold activities    Patient Stated Goals  Increase R shoulder pain-free mobility.      Currently in Pain?  No/denies         Seated shoulder pulley flexion/ abduction 20x each (cuing to slow down)- warm-up  Manual tx.:  Supine R shoulder PROM (all planes of movement)- 27 min. Supine wand sh. AAROM (press-ups/ sh. Flexion)- PT assist 10x2. (no pain). Discussed/ demonstrated proper technique with scar massage.   Ice to R sh. After tx.     PT Education - 07/02/18 0727    Education Details  Seated scapular retraction/ posture education.      Person(s) Educated  Patient    Methods  Explanation;Demonstration    Comprehension  Verbalized understanding;Returned demonstration          PT Long Term Goals - 06/26/18 1713      PT LONG TERM GOAL #1   Title  Pt. independent with HEP to increase R  shoulder AROM to WNL as compared to L shoulder to improve pain-free mobility with reaching.      Baseline  Supine R/L shoulder PROM: flexion (115/169 deg.), abduction (112/166 deg.), ER (50 deg./ WNL), IR (51 deg./ WNL).     Time  8    Period  Weeks    Status  New    Target Date  08/21/18      PT LONG TERM GOAL #2   Title  Pt. will increase FOTO to 66 to improve R shoulder mobilty.      Baseline  FOTO baseline 22 on 6/3    Time  8    Period  Weeks    Status  New    Target Date  08/21/18      PT LONG TERM GOAL #3   Title  Pt will decrease worst pain as reported on NPRS by at least 3 points in order to demonstrate clinically significant reduction in pain.     Baseline  Worst pain  8/10    Time  8    Period  Weeks    Status  New    Target Date  08/21/18      PT LONG TERM GOAL #4   Title  Pt will increase strength of shoulder flexion by at least 1/2 MMT grade in order to demonstrate improvement in strength and function     Baseline  Unable to assess at this time.    Time  8    Period  Weeks    Status  New    Target Date  08/21/18          Plan - 07/02/18 1610    Clinical Impression Statement  Pt. presents with good R shoulder PROM (all planes) per MD protocol.  Good compliance/ technique with sh. PROM/ manual tx. session.  Good incision healing noted and pt. instructed in scar massage technique.  PT added scap. retraction/ posture corrective ex. to HEP.      Clinical Presentation  Stable    Clinical Decision Making  Low    Rehab Potential  Good    PT Frequency  2x / week    PT Duration  8 weeks    PT Treatment/Interventions  ADLs/Self Care Home Management;Cryotherapy;Electrical Stimulation;Moist Heat;Iontophoresis 4mg /ml Dexamethasone;Functional mobility training;Therapeutic activities;Therapeutic exercise;Patient/family education;Neuromuscular re-education;Manual techniques;Passive range of motion;Dry needling    PT Next Visit Plan  Manual therapy per MD protocol.    PT Home Exercise Plan  see HEP (handouts provided).      Consulted and Agree with Plan of Care  Patient       Patient will benefit from skilled therapeutic intervention in order to improve the following deficits and impairments:  Improper body mechanics, Pain, Postural dysfunction, Decreased mobility, Decreased activity tolerance, Decreased endurance, Decreased range of motion, Decreased strength, Hypomobility, Impaired UE functional use, Impaired flexibility  Visit Diagnosis: Chronic right shoulder pain  Muscle weakness (generalized)  Shoulder joint stiffness, right     Problem List Patient Active Problem List   Diagnosis Date Noted  . Iron deficiency anemia 01/15/2018   Cammie Mcgee, PT, DPT # [phone] 07/02/2018, 7:43 AM  [facility] [address] [place], [place], [address] Phone: [phone]   Fax:  [phone]  Name: Andrea Zhang MRN: [id number] Date of Birth: [birth year]

## 2018-07-04 ENCOUNTER — Encounter: Payer: BLUE CROSS/BLUE SHIELD | Admitting: Physical Therapy

## 2018-07-06 NOTE — Therapy (Addendum)
[facility] [address] [place], [place], [address] Phone: [phone]   Fax:  [phone]  Physical Therapy Treatment  Patient Details  Name: Genevia Bouldin MRN: [id number] Date of Birth: [birth year] Referring Provider: Dr. Leron Croak   Encounter Date: 07/02/2018  PT End of Session - 07/10/18 0727    Visit Number  3    Number of Visits  16    Date for PT Re-Evaluation  08/20/18    PT Start Time  0944    PT Stop Time  1040    PT Time Calculation (min)  56 min    Activity Tolerance  Patient limited by pain;Patient tolerated treatment well    Behavior During Therapy  [facility] for tasks assessed/performed       Past Medical History:  Diagnosis Date  . Anemia    iron deficiency and b12 deficiency  . Anxiety   . Arthritis   . Asthma   . Diabetes mellitus without complication (HCC)   . GERD (gastroesophageal reflux disease)   . History of kidney stones   . Hypercholesterolemia   . Hypertension   . Migraines    MIGRAINES HAVE IMPROVED SINCE RECEIVING IRON    Past Surgical History:  Procedure Laterality Date  . CHOLECYSTECTOMY  2004  . COLONOSCOPY WITH ESOPHAGOGASTRODUODENOSCOPY (EGD)  02/2018  . ESOPHAGOGASTRODUODENOSCOPY (EGD) WITH PROPOFOL N/A 06/06/2016   Procedure: ESOPHAGOGASTRODUODENOSCOPY (EGD) WITH PROPOFOL;  Surgeon: Scot Jun, MD;  Location: [facility] ENDOSCOPY;  Service: Endoscopy;  Laterality: N/A;  . EXTRACORPOREAL SHOCK WAVE LITHOTRIPSY  2010  . JOINT REPLACEMENT  2013   LT TKR  . SAVORY DILATION N/A 06/06/2016   Procedure: SAVORY DILATION;  Surgeon: Scot Jun, MD;  [facility] ENDOSCOPY;  Service: Endoscopy;  Laterality: N/A;  . SAVORY DILATION  02/2018  . SHOULDER ARTHROSCOPY WITH OPEN ROTATOR CUFF REPAIR Right 05/24/2018   Procedure: right shoulder arthroscopy, extensive arthroscopic debridement, decompression, open rotator cuff repair, biceps tenodesis;  Surgeon: Christena Flake, MD;  Location: [facility]  ORS;  Service: Orthopedics;  Laterality: Right;    There were no vitals filed for this visit.    Pt. reports 4/10 R sh. pain. Pt. states she did not sleep well last night. Pt. used a pain pill last night. Pt. doing scar massage.       Seated shoulder pulley flexion/ abduction 20x each (cuing to slow down)- warm-up  There.ex.:   Reviewed HEP.  Instructed in more active assistive movement with pulley.    Manual tx.:  Supine R shoulder AA/PROM (all planes of movement)- 21 min. Supine wand sh. AAROM (press-ups/ sh. Flexion)- PT assist 10x2. (no pain). Discussed/ demonstrated proper technique with scar massage.   Ice to R sh. After tx.      PT Long Term Goals - 06/26/18 1713      PT LONG TERM GOAL #1   Title  Pt. independent with HEP to increase R shoulder AROM to WNL as compared to L shoulder to improve pain-free mobility with reaching.      Baseline  Supine R/L shoulder PROM: flexion (115/169 deg.), abduction (112/166 deg.), ER (50 deg./ WNL), IR (51 deg./ WNL).     Time  8    Period  Weeks    Status  New    Target Date  08/21/18      PT LONG TERM GOAL #2   Title  Pt. will increase FOTO to 66 to improve R shoulder mobilty.  Baseline  FOTO baseline 22 on 6/3    Time  8    Period  Weeks    Status  New    Target Date  08/21/18      PT LONG TERM GOAL #3   Title  Pt will decrease worst pain as reported on NPRS by at least 3 points in order to demonstrate clinically significant reduction in pain.     Baseline  Worst pain 8/10    Time  8    Period  Weeks    Status  New    Target Date  08/21/18      PT LONG TERM GOAL #4   Title  Pt will increase strength of shoulder flexion by at least 1/2 MMT grade in order to demonstrate improvement in strength and function     Baseline  Unable to assess at this time.    Time  8    Period  Weeks    Status  New    Target Date  08/21/18          Plan - 07/10/18 [id number]    Clinical Impression Statement  Pt. has progressed  with more AAROM of R shoulder in supine position in a pain-free range.  Good R shoulder capsular mobility (all planes) and pt. slowly weaning out of sling with home activities.  Pt. following MD protocol with no obstacles at this time.      Clinical Presentation  Stable    Clinical Decision Making  Low    Rehab Potential  Good    PT Frequency  2x / week    PT Duration  8 weeks    PT Treatment/Interventions  ADLs/Self Care Home Management;Cryotherapy;Electrical Stimulation;Moist Heat;Iontophoresis 4mg /ml Dexamethasone;Functional mobility training;Therapeutic activities;Therapeutic exercise;Patient/family education;Neuromuscular re-education;Manual techniques;Passive range of motion;Dry needling    PT Next Visit Plan  Manual therapy per MD protocol.    PT Home Exercise Plan  see HEP (handouts provided).         Patient will benefit from skilled therapeutic intervention in order to improve the following deficits and impairments:  Improper body mechanics, Pain, Postural dysfunction, Decreased mobility, Decreased activity tolerance, Decreased endurance, Decreased range of motion, Decreased strength, Hypomobility, Impaired UE functional use, Impaired flexibility  Visit Diagnosis: Chronic right shoulder pain  Muscle weakness (generalized)  Shoulder joint stiffness, right     Problem List Patient Active Problem List   Diagnosis Date Noted  . Iron deficiency anemia 01/15/2018   Cammie McgeeMichael C Jase Reep, PT, DPT # [id number] 07/10/2018, 8:48 AM  [facility] [address] [place], [place], [address] Phone: [phone]   Fax:  [phone]  Name: Lucillie GarfinkelMaria Wedekind MRN: [id number] Date of Birth: 08/08/1961

## 2018-07-10 ENCOUNTER — Ambulatory Visit: Payer: BLUE CROSS/BLUE SHIELD | Attending: Rheumatology | Admitting: Physical Therapy

## 2018-07-10 ENCOUNTER — Encounter: Payer: Self-pay | Admitting: Physical Therapy

## 2018-07-10 DIAGNOSIS — M25511 Pain in right shoulder: Secondary | ICD-10-CM | POA: Insufficient documentation

## 2018-07-10 DIAGNOSIS — G8929 Other chronic pain: Secondary | ICD-10-CM

## 2018-07-10 DIAGNOSIS — M6281 Muscle weakness (generalized): Secondary | ICD-10-CM | POA: Diagnosis present

## 2018-07-10 DIAGNOSIS — M25611 Stiffness of right shoulder, not elsewhere classified: Secondary | ICD-10-CM | POA: Insufficient documentation

## 2018-07-10 NOTE — Therapy (Signed)
01/15/2018   Cammie Mcgee, PT, DPT # [phone] 07/10/2018, 6:03 PM  [facility] [address] [place], [place], [address] Phone: [phone]   Fax:  [phone]  Name: Andrea Zhang MRN: [id number] Date of Birth: 05-Jul-1961  [facility] [address] [place], [place], [address] Phone: [phone]   Fax:  [phone]  Physical Therapy Treatment  Patient Details  Name: Andrea Zhang MRN: [id number] Date of Birth: 12-22-1960 Referring Provider: Dr. Leron Croak   Encounter Date: 07/10/2018  PT End of Session - 07/10/18 1757    Visit Number  4    Number of Visits  16    Date for PT Re-Evaluation  08/20/18    PT Start Time  0858    PT Stop Time  0951    PT Time Calculation (min)  53 min    Activity Tolerance  Patient limited by pain;Patient tolerated treatment well    Behavior During Therapy  [facility] for tasks assessed/performed       Past Medical History:  Diagnosis Date  . Anemia    iron deficiency and b12 deficiency  . Anxiety   . Arthritis   . Asthma   . Diabetes mellitus without complication (HCC)   . GERD (gastroesophageal reflux disease)   . History of kidney stones   . Hypercholesterolemia   . Hypertension   . Migraines    MIGRAINES HAVE IMPROVED SINCE RECEIVING IRON    Past Surgical History:  Procedure Laterality Date  . CHOLECYSTECTOMY  2004  . COLONOSCOPY WITH ESOPHAGOGASTRODUODENOSCOPY (EGD)  02/2018  . ESOPHAGOGASTRODUODENOSCOPY (EGD) WITH PROPOFOL N/A 06/06/2016   Procedure: ESOPHAGOGASTRODUODENOSCOPY (EGD) WITH PROPOFOL;  Surgeon: Scot Jun, MD;  Location: [facility] ENDOSCOPY;  Service: Endoscopy;  Laterality: N/A;  . EXTRACORPOREAL SHOCK WAVE LITHOTRIPSY  2010  . JOINT REPLACEMENT  2013   LT TKR  . SAVORY DILATION N/A 06/06/2016   Procedure: SAVORY DILATION;  Surgeon: Scot Jun, MD;  Location: [facility] ENDOSCOPY;  Service: Endoscopy;  Laterality: N/A;  . SAVORY DILATION  02/2018  . SHOULDER ARTHROSCOPY WITH OPEN ROTATOR CUFF REPAIR Right 05/24/2018   Procedure: right shoulder arthroscopy, extensive arthroscopic debridement, decompression, open rotator cuff repair, biceps tenodesis;  Surgeon: Christena Flake, MD;  Location: [facility]  ORS;  Service: Orthopedics;  Laterality: Right;    There were no vitals filed for this visit.  Subjective Assessment - 07/10/18 0859    Subjective  Pt. reports 1/10 R shoulder discomfort prior to tx. session.  Pt. states she had an increase pain in R shoulder on Saturday with no reason given.  Pt. slept well last night.    (Pended)     Pertinent History  s/p L TKA 5 years ago (no issues).  Decrease iron and c/o SOB currently.  See recent medical issues.  Pt. not sleeping well on R side due to pain.    (Pended)     Limitations  Lifting;Writing;House hold activities  (Pended)     Patient Stated Goals  Increase R shoulder pain-free mobility.    (Pended)     Currently in Pain?  Yes  (Pended)     Pain Score  1   (Pended)     Pain Location  Shoulder  (Pended)     Pain Orientation  Right  (Pended)     Pain Descriptors / Indicators  Aching  (Pended)     Pain Type  Surgical pain  (Pended)           Treatment:  There.ex.: reviewed R sh. A/AROM in seated and shoulder position. B UBE 2 min. F/b (AAROM)- consistent cadence Standing isometric (light) at doorframe (flexion/ ext./ abduction)- 5x each (as tolerate)- slight pain increase. Supine R shoulder wt.  01/15/2018   Cammie Mcgee, PT, DPT # [phone] 07/10/2018, 6:03 PM  [facility] [address] [place], [place], [address] Phone: [phone]   Fax:  [phone]  Name: Andrea Zhang MRN: [id number] Date of Birth: 05-Jul-1961

## 2018-07-12 ENCOUNTER — Encounter: Payer: Self-pay | Admitting: Physical Therapy

## 2018-07-12 ENCOUNTER — Ambulatory Visit: Payer: BLUE CROSS/BLUE SHIELD | Admitting: Physical Therapy

## 2018-07-12 DIAGNOSIS — M25611 Stiffness of right shoulder, not elsewhere classified: Secondary | ICD-10-CM

## 2018-07-12 DIAGNOSIS — G8929 Other chronic pain: Secondary | ICD-10-CM

## 2018-07-12 DIAGNOSIS — M25511 Pain in right shoulder: Secondary | ICD-10-CM | POA: Diagnosis not present

## 2018-07-12 DIAGNOSIS — M6281 Muscle weakness (generalized): Secondary | ICD-10-CM

## 2018-07-12 NOTE — Patient Instructions (Signed)
Access Code: [email or web address]  URL: [email or web address]  Date: 07/12/2018  Prepared by: Dorene Grebe   Exercises  Standing Shoulder Internal Rotation AAROM with Pulley - 20 reps - 1 sets - 2x daily - 7x weekly  Standing Bilateral Shoulder Internal Rotation AAROM with Dowel - 20 reps - 1 sets - 2x daily - 7x weekly  Shoulder Extension with Resistance - 20 reps - 1 sets - 1x daily - 4x weekly  Scapular Retraction with Resistance - 20 reps - 1 sets - 1x daily - 4x weekly  Standing Elbow Extension with Anchored Resistance at Wall - 20 reps - 1 sets - 1x daily - 4x weekly  Standing Single Arm Shoulder Flexion with Posterior Anchored Resistance - 20 reps - 1 sets - 1x daily - 4x weekly

## 2018-07-12 NOTE — Therapy (Signed)
[facility] [address] [place], [place], [address] Phone: [phone]   Fax:  [phone]  Physical Therapy Treatment  Patient Details  Name: Brittny Sanroman MRN: [id number] Date of Birth: 10-May-1961 Referring Provider: Dr. Leron Croak   Encounter Date: 07/12/2018    Treatment: 5 of 16.  Recert date:  84/16/60   Past Medical History:  Diagnosis Date  . Anemia    iron deficiency and b12 deficiency  . Anxiety   . Arthritis   . Asthma   . Diabetes mellitus without complication (HCC)   . GERD (gastroesophageal reflux disease)   . History of kidney stones   . Hypercholesterolemia   . Hypertension   . Migraines    MIGRAINES HAVE IMPROVED SINCE RECEIVING IRON    Past Surgical History:  Procedure Laterality Date  . CHOLECYSTECTOMY  2004  . COLONOSCOPY WITH ESOPHAGOGASTRODUODENOSCOPY (EGD)  02/2018  . ESOPHAGOGASTRODUODENOSCOPY (EGD) WITH PROPOFOL N/A 06/06/2016   Procedure: ESOPHAGOGASTRODUODENOSCOPY (EGD) WITH PROPOFOL;  Surgeon: Scot Jun, MD;  Location: [facility] ENDOSCOPY;  Service: Endoscopy;  Laterality: N/A;  . EXTRACORPOREAL SHOCK WAVE LITHOTRIPSY  2010  . JOINT REPLACEMENT  2013   LT TKR  . SAVORY DILATION N/A 06/06/2016   Procedure: SAVORY DILATION;  Surgeon: Scot Jun, MD;  Location: [facility] ENDOSCOPY;  Service: Endoscopy;  Laterality: N/A;  . SAVORY DILATION  02/2018  . SHOULDER ARTHROSCOPY WITH OPEN ROTATOR CUFF REPAIR Right 05/24/2018   Procedure: right shoulder arthroscopy, extensive arthroscopic debridement, decompression, open rotator cuff repair, biceps tenodesis;  Surgeon: Christena Flake, MD;  Location: [facility] ORS;  Service: Orthopedics;  Laterality: Right;    There were no vitals filed for this visit.   Pt. entered PT with no sling use. Pt. states MD is happy with pts. progress and will continue per MD protocol.        Treatment:  There.ex.:  B UBE 3 min. F/b (consistent cadence). Standing B  shoulder flexion (all planes)- 5x each with mirror feedback Standing green ball up/down wall with shoulder flexion holds 10x. Standing RTB ex.: scap. Retraction/ ext./ ER/ flexion 20x each.  See new HEP Standing shoulder IR with wand/ pulley.    Manual tx.:  Supine R shoulder stretches all planes of movement. STM to R anterior shoulder/ prox. bicep          Good technique with addition of resisted R shoulder ex. program. Pt. instructed to avoid any pinching/ pain provoking movement patterns in R shoulder during any resistive tasks. Pt. benefits from R hand/wrist supination position during shoulder flexion to avoid "catching".   PT Education - 07/16/18 1042    Education Details  See HEP    Person(s) Educated  Patient    Methods  Explanation;Demonstration;Handout    Comprehension  Verbalized understanding;Returned demonstration          PT Long Term Goals - 06/26/18 1713      PT LONG TERM GOAL #1   Title  Pt. independent with HEP to increase R shoulder AROM to WNL as compared to L shoulder to improve pain-free mobility with reaching.      Baseline  Supine R/L shoulder PROM: flexion (115/169 deg.), abduction (112/166 deg.), ER (50 deg./ WNL), IR (51 deg./ WNL).     Time  8    Period  Weeks    Status  New    Target Date  08/21/18      PT LONG TERM GOAL #2   Title  Pt.  will increase FOTO to 66 to improve R shoulder mobilty.      Baseline  FOTO baseline 22 on 6/3    Time  8    Period  Weeks    Status  New    Target Date  08/21/18      PT LONG TERM GOAL #3   Title  Pt will decrease worst pain as reported on NPRS by at least 3 points in order to demonstrate clinically significant reduction in pain.     Baseline  Worst pain 8/10    Time  8    Period  Weeks    Status  New    Target Date  08/21/18      PT LONG TERM GOAL #4   Title  Pt will increase strength of shoulder flexion by at least 1/2 MMT grade in order to demonstrate improvement in strength and function      Baseline  Unable to assess at this time.    Time  8    Period  Weeks    Status  New    Target Date  08/21/18           Patient will benefit from skilled therapeutic intervention in order to improve the following deficits and impairments:  Improper body mechanics, Pain, Postural dysfunction, Decreased mobility, Decreased activity tolerance, Decreased endurance, Decreased range of motion, Decreased strength, Hypomobility, Impaired UE functional use, Impaired flexibility  Visit Diagnosis: Chronic right shoulder pain  Muscle weakness (generalized)  Shoulder joint stiffness, right     Problem List Patient Active Problem List   Diagnosis Date Noted  . Iron deficiency anemia 01/15/2018   Cammie Mcgee, PT, DPT # [id number] 07/16/2018, 10:49 AM  [facility] [address] [place], [place], [address] Phone: [phone]   Fax:  [phone]  Name: Janequa Beckham MRN: [id number] Date of Birth: [birth year]

## 2018-07-16 ENCOUNTER — Ambulatory Visit: Payer: BLUE CROSS/BLUE SHIELD | Admitting: Physical Therapy

## 2018-07-16 DIAGNOSIS — G8929 Other chronic pain: Secondary | ICD-10-CM

## 2018-07-16 DIAGNOSIS — M25511 Pain in right shoulder: Principal | ICD-10-CM

## 2018-07-16 DIAGNOSIS — M6281 Muscle weakness (generalized): Secondary | ICD-10-CM

## 2018-07-16 DIAGNOSIS — M25611 Stiffness of right shoulder, not elsewhere classified: Secondary | ICD-10-CM

## 2018-07-18 ENCOUNTER — Ambulatory Visit: Payer: BLUE CROSS/BLUE SHIELD | Admitting: Physical Therapy

## 2018-07-18 ENCOUNTER — Encounter: Payer: Self-pay | Admitting: Physical Therapy

## 2018-07-18 DIAGNOSIS — M25511 Pain in right shoulder: Principal | ICD-10-CM

## 2018-07-18 DIAGNOSIS — G8929 Other chronic pain: Secondary | ICD-10-CM

## 2018-07-18 DIAGNOSIS — M6281 Muscle weakness (generalized): Secondary | ICD-10-CM

## 2018-07-18 DIAGNOSIS — M25611 Stiffness of right shoulder, not elsewhere classified: Secondary | ICD-10-CM

## 2018-07-18 NOTE — Therapy (Signed)
to WNL as compared to L shoulder to improve pain-free mobility with reaching.      Baseline  Supine R/L shoulder PROM: flexion (115/169 deg.), abduction (112/166 deg.), ER (50 deg./ WNL), IR (51 deg./ WNL).     Time  8    Period  Weeks    Status  New    Target Date  08/21/18      PT LONG TERM GOAL #2   Title  Pt. will increase FOTO to 66 to improve R shoulder mobilty.      Baseline  FOTO baseline 22 on 6/3    Time  8    Period  Weeks    Status  New    Target Date  08/21/18      PT LONG TERM GOAL #3   Title  Pt will decrease worst pain as reported on NPRS by at least 3 points in order to demonstrate clinically significant reduction in pain.     Baseline  Worst pain 8/10    Time  8    Period  Weeks    Status  New    Target Date   08/21/18      PT LONG TERM GOAL #4   Title  Pt will increase strength of shoulder flexion by at least 1/2 MMT grade in order to demonstrate improvement in strength and function     Baseline  Unable to assess at this time.    Time  8    Period  Weeks    Status  New    Target Date  08/21/18         Plan - 07/18/18 0727    Clinical Impression Statement  Pt. able to complete more progressive R sh. resistive ex. program in clinic today with Nautilus.  Pt. has occasional "catching" symptoms with certain movement patterns during resistance (esp. with flexion/ abduction motion).  Good scapular movement/ elevation with sh. flexion (neutral forearm/UE position).  Pt. will benefit from continued PT services to increase sh. strengthening/ functional mobility with daily and household tasks.      Clinical Presentation  Stable    Clinical Decision Making  Low    Rehab Potential  Good    PT Frequency  2x / week    PT Duration  8 weeks    PT Treatment/Interventions  ADLs/Self Care Home Management;Cryotherapy;Electrical Stimulation;Moist Heat;Iontophoresis 4mg /ml Dexamethasone;Functional mobility training;Therapeutic activities;Therapeutic exercise;Patient/family education;Neuromuscular re-education;Manual techniques;Passive range of motion;Dry needling    PT Next Visit Plan  Manual therapy/ ther.ex. per MD protocol.      PT Home Exercise Plan  see HEP (handouts provided).      Consulted and Agree with Plan of Care  Patient       Patient will benefit from skilled therapeutic intervention in order to improve the following deficits and impairments:  Improper body mechanics, Pain, Postural dysfunction, Decreased mobility, Decreased activity tolerance, Decreased endurance, Decreased range of motion, Decreased strength, Hypomobility, Impaired UE functional use, Impaired flexibility  Visit Diagnosis: Chronic right shoulder pain  Muscle weakness (generalized)  Shoulder joint stiffness,  right     Problem List Patient Active Problem List   Diagnosis Date Noted  . Iron deficiency anemia 01/15/2018   Cammie Mcgee, PT, DPT # [phone] 07/18/2018, 9:22 AM  [facility] [address] [place], [place], [address] Phone: [phone]   Fax:  [phone]  Name: Rocelia Dagraca MRN: [id number] Date of Birth: 08/07/1961  [facility] [address] [place], [place], [address] Phone: [phone]   Fax:  [phone]  Physical Therapy Treatment  Patient Details  Name: Alazay Leicht MRN: [id number] Date of Birth: 11-04-1961 Referring Provider: Dr. Leron Croak   Encounter Date: 07/16/2018  PT End of Session - 07/18/18 0724    Visit Number  6    Number of Visits  16    Date for PT Re-Evaluation  08/20/18    PT Start Time  0939    PT Stop Time  1032    PT Time Calculation (min)  53 min    Activity Tolerance  Patient limited by pain;Patient tolerated treatment well    Behavior During Therapy  [facility] for tasks assessed/performed       Past Medical History:  Diagnosis Date  . Anemia    iron deficiency and b12 deficiency  . Anxiety   . Arthritis   . Asthma   . Diabetes mellitus without complication (HCC)   . GERD (gastroesophageal reflux disease)   . History of kidney stones   . Hypercholesterolemia   . Hypertension   . Migraines    MIGRAINES HAVE IMPROVED SINCE RECEIVING IRON    Past Surgical History:  Procedure Laterality Date  . CHOLECYSTECTOMY  2004  . COLONOSCOPY WITH ESOPHAGOGASTRODUODENOSCOPY (EGD)  02/2018  . ESOPHAGOGASTRODUODENOSCOPY (EGD) WITH PROPOFOL N/A 06/06/2016   Procedure: ESOPHAGOGASTRODUODENOSCOPY (EGD) WITH PROPOFOL;  Surgeon: Scot Jun, MD;  Location: [facility] ENDOSCOPY;  Service: Endoscopy;  Laterality: N/A;  . EXTRACORPOREAL SHOCK WAVE LITHOTRIPSY  2010  . JOINT REPLACEMENT  2013   LT TKR  . SAVORY DILATION N/A 06/06/2016   Procedure: SAVORY DILATION;  Surgeon: Scot Jun, MD;  Location: [facility] ENDOSCOPY;  Service: Endoscopy;  Laterality: N/A;  . SAVORY DILATION  02/2018  . SHOULDER ARTHROSCOPY WITH OPEN ROTATOR CUFF REPAIR Right 05/24/2018   Procedure: right shoulder arthroscopy, extensive arthroscopic debridement, decompression, open rotator cuff repair, biceps tenodesis;  Surgeon: Christena Flake, MD;  Location: [facility]  ORS;  Service: Orthopedics;  Laterality: Right;    There were no vitals filed for this visit.  Subjective Assessment - 07/18/18 0721    Subjective  Pt. reports 2/10 R shoulder pain currently at rest.  Pt. has a few questions about resistive ex. program (PT answered).      Pertinent History  s/p L TKA 5 years ago (no issues).  Decrease iron and c/o SOB currently.  See recent medical issues.  Pt. not sleeping well on R side due to pain.      Limitations  Lifting;Writing;House hold activities    Patient Stated Goals  Increase R shoulder pain-free mobility.      Currently in Pain?  Yes    Pain Score  2     Pain Location  Shoulder    Pain Orientation  Right    Pain Descriptors / Indicators  Aching    Pain Type  Surgical pain          Treatment:  There.ex.:  Reviewed HEP in depth. B UBE 3 min. F/b. Nautilus: B sh. Adduction 20#/ lat. Pull downs 30#/ triceps 30#/ scap. Retraction 30#/ sh. Extension 30#/ chest press 20#/  Supine sh. Isometrics (all planes)/ rhythmic stabs (mod. Resistance). Supine serratus punches 10x2.     PT Long Term Goals - 06/26/18 1713      PT LONG TERM GOAL #1   Title  Pt. independent with HEP to increase R shoulder AROM

## 2018-07-21 NOTE — Therapy (Addendum)
[facility] [address] [place], [place], [address] Phone: [phone]   Fax:  [phone]  Physical Therapy Treatment  Patient Details  Name: Andrea Zhang MRN: [id number] Date of Birth: December 18, 1960 Referring Provider: Dr. Leron Croak   Encounter Date: 07/18/2018  PT End of Session - 07/23/18 0714    Visit Number  7    Number of Visits  16    Date for PT Re-Evaluation  08/20/18    PT Start Time  0941    PT Stop Time  1032    PT Time Calculation (min)  51 min    Activity Tolerance  Patient limited by pain;Patient tolerated treatment well    Behavior During Therapy  [facility] for tasks assessed/performed       Past Medical History:  Diagnosis Date  . Anemia    iron deficiency and b12 deficiency  . Anxiety   . Arthritis   . Asthma   . Diabetes mellitus without complication (HCC)   . GERD (gastroesophageal reflux disease)   . History of kidney stones   . Hypercholesterolemia   . Hypertension   . Migraines    MIGRAINES HAVE IMPROVED SINCE RECEIVING IRON    Past Surgical History:  Procedure Laterality Date  . CHOLECYSTECTOMY  2004  . COLONOSCOPY WITH ESOPHAGOGASTRODUODENOSCOPY (EGD)  02/2018  . ESOPHAGOGASTRODUODENOSCOPY (EGD) WITH PROPOFOL N/A 06/06/2016   Procedure: ESOPHAGOGASTRODUODENOSCOPY (EGD) WITH PROPOFOL;  Surgeon: Scot Jun, MD;  Location: [facility] ENDOSCOPY;  Service: Endoscopy;  Laterality: N/A;  . EXTRACORPOREAL SHOCK WAVE LITHOTRIPSY  2010  . JOINT REPLACEMENT  2013   LT TKR  . SAVORY DILATION N/A 06/06/2016   Procedure: SAVORY DILATION;  Surgeon: Scot Jun, MD;  Location: [facility] ENDOSCOPY;  Service: Endoscopy;  Laterality: N/A;  . SAVORY DILATION  02/2018  . SHOULDER ARTHROSCOPY WITH OPEN ROTATOR CUFF REPAIR Right 05/24/2018   Procedure: right shoulder arthroscopy, extensive arthroscopic debridement, decompression, open rotator cuff repair, biceps tenodesis;  Surgeon: Christena Flake, MD;  Location: [facility]  ORS;  Service: Orthopedics;  Laterality: Right;    There were no vitals filed for this visit.  Subjective Assessment - 07/23/18 0711    Subjective  Pt. states she is doing okay.  Pt. c/o 2/10 R shoulder pain.  Pt. reports MD is happy with progress and pt. compliant with HEP.      Pertinent History  s/p L TKA 5 years ago (no issues).  Decrease iron and c/o SOB currently.  See recent medical issues.  Pt. not sleeping well on R side due to pain.      Limitations  Lifting;Writing;House hold activities    Patient Stated Goals  Increase R shoulder pain-free mobility.      Currently in Pain?  Yes    Pain Score  2     Pain Location  Shoulder    Pain Orientation  Right    Pain Descriptors / Indicators  Aching       Treatment:  There.ex.:  B UBE 3 min. F/b. Nautilus: B sh. Adduction 20#/ lat. Pull downs 40#/ triceps 30#/ scap. Retraction 40#/ sh. Extension 30#/ chest press 20#/  Supine sh. Isometrics (all planes)/ rhythmic stabs (mod. Resistance). Supine serratus punches 10x2.  Standing PNF ex. D1/ D2 (no wt. At this time).    PT Long Term Goals - 06/26/18 1713      PT LONG TERM GOAL #1   Title  Pt. independent with HEP to increase R shoulder AROM  to WNL as compared to L shoulder to improve pain-free mobility with reaching.      Baseline  Supine R/L shoulder PROM: flexion (115/169 deg.), abduction (112/166 deg.), ER (50 deg./ WNL), IR (51 deg./ WNL).     Time  8    Period  Weeks    Status  New    Target Date  08/21/18      PT LONG TERM GOAL #2   Title  Pt. will increase FOTO to 66 to improve R shoulder mobilty.      Baseline  FOTO baseline 22 on 6/3    Time  8    Period  Weeks    Status  New    Target Date  08/21/18      PT LONG TERM GOAL #3   Title  Pt will decrease worst pain as reported on NPRS by at least 3 points in order to demonstrate clinically significant reduction in pain.     Baseline  Worst pain 8/10    Time  8    Period  Weeks    Status  New    Target Date   08/21/18      PT LONG TERM GOAL #4   Title  Pt will increase strength of shoulder flexion by at least 1/2 MMT grade in order to demonstrate improvement in strength and function     Baseline  Unable to assess at this time.    Time  8    Period  Weeks    Status  New    Target Date  08/21/18            Plan - 07/23/18 0715    Clinical Impression Statement  Pt. presents with marked increase in R shoulder IR AROM (pt. prefers using the wand vs. pulley at home).  Good B shoulder flexion/ abd. AROM in standing posture prior to progression to resisted ex.  Pt. will benefit from a more progressive HEP/ functional tasks to promote return to PLOF.  No increase c/o pain after tx. session but fatigue noted.      Clinical Presentation  Stable    Clinical Decision Making  Low    Rehab Potential  Good    PT Frequency  2x / week    PT Duration  8 weeks    PT Treatment/Interventions  ADLs/Self Care Home Management;Cryotherapy;Electrical Stimulation;Moist Heat;Iontophoresis 4mg /ml Dexamethasone;Functional mobility training;Therapeutic activities;Therapeutic exercise;Patient/family education;Neuromuscular re-education;Manual techniques;Passive range of motion;Dry needling    PT Next Visit Plan  Manual therapy/ ther.ex. per MD protocol.      PT Home Exercise Plan  see HEP (handouts provided).      Consulted and Agree with Plan of Care  Patient       Patient will benefit from skilled therapeutic intervention in order to improve the following deficits and impairments:  Improper body mechanics, Pain, Postural dysfunction, Decreased mobility, Decreased activity tolerance, Decreased endurance, Decreased range of motion, Decreased strength, Hypomobility, Impaired UE functional use, Impaired flexibility  Visit Diagnosis: Chronic right shoulder pain  Muscle weakness (generalized)  Shoulder joint stiffness, right     Problem List Patient Active Problem List   Diagnosis Date Noted  . Iron deficiency  anemia 01/15/2018   Cammie Mcgee, PT, DPT # [phone] 07/23/2018, 7:47 AM  [place] [facility] [address] [place], [place], [address] Phone: [phone]   Fax:  [phone]  Name: Andrea Zhang MRN: [id number] Date of Birth: [birth year]

## 2018-07-23 ENCOUNTER — Ambulatory Visit: Payer: BLUE CROSS/BLUE SHIELD | Admitting: Physical Therapy

## 2018-07-23 ENCOUNTER — Encounter: Payer: Self-pay | Admitting: Physical Therapy

## 2018-07-23 DIAGNOSIS — M25611 Stiffness of right shoulder, not elsewhere classified: Secondary | ICD-10-CM

## 2018-07-23 DIAGNOSIS — M25511 Pain in right shoulder: Secondary | ICD-10-CM | POA: Diagnosis not present

## 2018-07-23 DIAGNOSIS — M6281 Muscle weakness (generalized): Secondary | ICD-10-CM

## 2018-07-23 DIAGNOSIS — G8929 Other chronic pain: Secondary | ICD-10-CM

## 2018-07-24 NOTE — Therapy (Signed)
[facility] [address] [place], [place], [address] Phone: [phone]   Fax:  [phone]  Physical Therapy Treatment  Patient Details  Name: Andrea Zhang MRN: [id number] Date of Birth: [birth year] Referring Provider: Dr. Leron Croak   Encounter Date: 07/23/2018  PT End of Session - 07/23/18 0909    Visit Number  8    Number of Visits  16    Date for PT Re-Evaluation  08/20/18    PT Start Time  0813    PT Stop Time  0912    PT Time Calculation (min)  59 min    Activity Tolerance  Patient limited by pain;Patient tolerated treatment well    Behavior During Therapy  [facility] for tasks assessed/performed       Past Medical History:  Diagnosis Date  . Anemia    iron deficiency and b12 deficiency  . Anxiety   . Arthritis   . Asthma   . Diabetes mellitus without complication (HCC)   . GERD (gastroesophageal reflux disease)   . History of kidney stones   . Hypercholesterolemia   . Hypertension   . Migraines    MIGRAINES HAVE IMPROVED SINCE RECEIVING IRON    Past Surgical History:  Procedure Laterality Date  . CHOLECYSTECTOMY  2004  . COLONOSCOPY WITH ESOPHAGOGASTRODUODENOSCOPY (EGD)  02/2018  . ESOPHAGOGASTRODUODENOSCOPY (EGD) WITH PROPOFOL N/A 06/06/2016   Procedure: ESOPHAGOGASTRODUODENOSCOPY (EGD) WITH PROPOFOL;  Surgeon: Scot Jun, MD;  Location: [facility] ENDOSCOPY;  Service: Endoscopy;  Laterality: N/A;  . EXTRACORPOREAL SHOCK WAVE LITHOTRIPSY  2010  . JOINT REPLACEMENT  2013   LT TKR  . SAVORY DILATION N/A 06/06/2016   Procedure: SAVORY DILATION;  Surgeon: Scot Jun, MD;  Location: [facility] ENDOSCOPY;  Service: Endoscopy;  Laterality: N/A;  . SAVORY DILATION  02/2018  . SHOULDER ARTHROSCOPY WITH OPEN ROTATOR CUFF REPAIR Right 05/24/2018   Procedure: right shoulder arthroscopy, extensive arthroscopic debridement, decompression, open rotator cuff repair, biceps tenodesis;  Surgeon: Christena Flake, MD;  Location: [facility]  ORS;  Service: Orthopedics;  Laterality: Right;    There were no vitals filed for this visit.  Subjective Assessment - 07/23/18 0821    Subjective  Pt. states she may have overdone some things on Saturday.  Pt. reports that she lifted groceries on Satruday and lifted a case of water    Pertinent History  s/p L TKA 5 years ago (no issues).  Decrease iron and c/o SOB currently.  See recent medical issues.  Pt. not sleeping well on R side due to pain.      Limitations  Lifting;Writing;House hold activities    Patient Stated Goals  Increase R shoulder pain-free mobility.      Currently in Pain?  Yes    Pain Score  2     Pain Location  Shoulder    Pain Orientation  Right    Pain Descriptors / Indicators  Aching        Treatment:  There.ex.:  B UBE 3 min. F/b  Nautilus: B sh. Adduction 20#/ lat. Pull downs 20#/ triceps 20#/ scap. Retraction 20#/ sh. Extension 20#/ chest press 20#/x20 each Supine sh. Isometrics (all planes)/ rhythmic stabs (mod. Resistance) with 3# dumbbells Supine serratus punches 10x2 with 3# dumbbells Standing shoulder AROM all planes (mirror feedback and PT cuing for posture/ symmetry).      PT Long Term Goals - 06/26/18 1713      PT LONG TERM GOAL #1   Title  [facility] [address] [place], [place], [address] Phone: [phone]   Fax:  [phone]  Physical Therapy Treatment  Patient Details  Name: Andrea Zhang MRN: [id number] Date of Birth: [birth year] Referring Provider: Dr. Leron Croak   Encounter Date: 07/23/2018  PT End of Session - 07/23/18 0909    Visit Number  8    Number of Visits  16    Date for PT Re-Evaluation  08/20/18    PT Start Time  0813    PT Stop Time  0912    PT Time Calculation (min)  59 min    Activity Tolerance  Patient limited by pain;Patient tolerated treatment well    Behavior During Therapy  [facility] for tasks assessed/performed       Past Medical History:  Diagnosis Date  . Anemia    iron deficiency and b12 deficiency  . Anxiety   . Arthritis   . Asthma   . Diabetes mellitus without complication (HCC)   . GERD (gastroesophageal reflux disease)   . History of kidney stones   . Hypercholesterolemia   . Hypertension   . Migraines    MIGRAINES HAVE IMPROVED SINCE RECEIVING IRON    Past Surgical History:  Procedure Laterality Date  . CHOLECYSTECTOMY  2004  . COLONOSCOPY WITH ESOPHAGOGASTRODUODENOSCOPY (EGD)  02/2018  . ESOPHAGOGASTRODUODENOSCOPY (EGD) WITH PROPOFOL N/A 06/06/2016   Procedure: ESOPHAGOGASTRODUODENOSCOPY (EGD) WITH PROPOFOL;  Surgeon: Scot Jun, MD;  Location: [facility] ENDOSCOPY;  Service: Endoscopy;  Laterality: N/A;  . EXTRACORPOREAL SHOCK WAVE LITHOTRIPSY  2010  . JOINT REPLACEMENT  2013   LT TKR  . SAVORY DILATION N/A 06/06/2016   Procedure: SAVORY DILATION;  Surgeon: Scot Jun, MD;  Location: [facility] ENDOSCOPY;  Service: Endoscopy;  Laterality: N/A;  . SAVORY DILATION  02/2018  . SHOULDER ARTHROSCOPY WITH OPEN ROTATOR CUFF REPAIR Right 05/24/2018   Procedure: right shoulder arthroscopy, extensive arthroscopic debridement, decompression, open rotator cuff repair, biceps tenodesis;  Surgeon: Christena Flake, MD;  Location: [facility]  ORS;  Service: Orthopedics;  Laterality: Right;    There were no vitals filed for this visit.  Subjective Assessment - 07/23/18 0821    Subjective  Pt. states she may have overdone some things on Saturday.  Pt. reports that she lifted groceries on Satruday and lifted a case of water    Pertinent History  s/p L TKA 5 years ago (no issues).  Decrease iron and c/o SOB currently.  See recent medical issues.  Pt. not sleeping well on R side due to pain.      Limitations  Lifting;Writing;House hold activities    Patient Stated Goals  Increase R shoulder pain-free mobility.      Currently in Pain?  Yes    Pain Score  2     Pain Location  Shoulder    Pain Orientation  Right    Pain Descriptors / Indicators  Aching        Treatment:  There.ex.:  B UBE 3 min. F/b  Nautilus: B sh. Adduction 20#/ lat. Pull downs 20#/ triceps 20#/ scap. Retraction 20#/ sh. Extension 20#/ chest press 20#/x20 each Supine sh. Isometrics (all planes)/ rhythmic stabs (mod. Resistance) with 3# dumbbells Supine serratus punches 10x2 with 3# dumbbells Standing shoulder AROM all planes (mirror feedback and PT cuing for posture/ symmetry).      PT Long Term Goals - 06/26/18 1713      PT LONG TERM GOAL #1   Title  Name: Delbra Zellars MRN: [id number] Date of Birth: 05-18-1961

## 2018-07-25 ENCOUNTER — Encounter: Payer: Self-pay | Admitting: Physical Therapy

## 2018-07-25 ENCOUNTER — Ambulatory Visit: Payer: BLUE CROSS/BLUE SHIELD | Admitting: Physical Therapy

## 2018-07-25 DIAGNOSIS — M6281 Muscle weakness (generalized): Secondary | ICD-10-CM

## 2018-07-25 DIAGNOSIS — G8929 Other chronic pain: Secondary | ICD-10-CM

## 2018-07-25 DIAGNOSIS — M25511 Pain in right shoulder: Principal | ICD-10-CM

## 2018-07-25 DIAGNOSIS — M25611 Stiffness of right shoulder, not elsewhere classified: Secondary | ICD-10-CM

## 2018-07-25 NOTE — Therapy (Signed)
pain  Muscle weakness (generalized)  Shoulder joint stiffness, right     Problem List Patient Active Problem List   Diagnosis Date Noted  . Iron deficiency anemia 01/15/2018   Cammie Mcgee, PT, DPT # [id number] Nolon Bussing, SPT 07/25/2018, 2:04 PM  [place] [facility] [address] [place], [place], [address] Phone: [phone]   Fax:  [phone]  Name: Andrea Zhang MRN: [id number] Date of Birth: 1961/07/21  [place] [facility] [address] [place], [place], [address] Phone: [phone]   Fax:  [phone]  Physical Therapy Treatment  Patient Details  Name: Andrea Zhang MRN: [id number] Date of Birth: 1961/03/25 Referring Provider: Dr. Leron CroakJohn Poggi   Encounter Date: 07/25/2018  PT End of Session - 07/25/18 0855    Visit Number  9    Number of Visits  16    Date for PT Re-Evaluation  08/20/18    PT Start Time  0813    PT Stop Time  0904    PT Time Calculation (min)  51 min    Activity Tolerance  Patient limited by pain;Patient tolerated treatment well    Behavior During Therapy  [facility] for tasks assessed/performed       Past Medical History:  Diagnosis Date  . Anemia    iron deficiency and b12 deficiency  . Anxiety   . Arthritis   . Asthma   . Diabetes mellitus without complication (HCC)   . GERD (gastroesophageal reflux disease)   . History of kidney stones   . Hypercholesterolemia   . Hypertension   . Migraines    MIGRAINES HAVE IMPROVED SINCE RECEIVING IRON    Past Surgical History:  Procedure Laterality Date  . CHOLECYSTECTOMY  2004  . COLONOSCOPY WITH ESOPHAGOGASTRODUODENOSCOPY (EGD)  02/2018  . ESOPHAGOGASTRODUODENOSCOPY (EGD) WITH PROPOFOL N/A 06/06/2016   Procedure: ESOPHAGOGASTRODUODENOSCOPY (EGD) WITH PROPOFOL;  Surgeon: Scot Junobert T Elliott, MD;  [facility];  Service: Endoscopy;  Laterality: N/A;  . EXTRACORPOREAL SHOCK WAVE LITHOTRIPSY  2010  . JOINT REPLACEMENT  2013   LT TKR  . SAVORY DILATION N/A 06/06/2016   Procedure: SAVORY DILATION;  Surgeon: Scot Junobert T Elliott, MD;  [facility];  Service: Endoscopy;  Laterality: N/A;  . SAVORY DILATION  02/2018  . SHOULDER ARTHROSCOPY WITH OPEN ROTATOR CUFF REPAIR Right 05/24/2018   Procedure: right shoulder arthroscopy, extensive arthroscopic debridement, decompression, open rotator cuff repair, biceps tenodesis;  Surgeon: Christena FlakePoggi, John J, MD;  Location: [facility]  ORS;  Service: Orthopedics;  Laterality: Right;    There were no vitals filed for this visit.  Subjective Assessment - 07/25/18 0834    Subjective  Pt. states she has recovered since Saturday's activities.  Pt. states that she was in a little bit of pain yesterday while she was sitting at her desk.  She was unsure of why it was hurting as she did not reach wrong or pick anything up heavy, it was just hurting.    Pertinent History  s/p L TKA 5 years ago (no issues).  Decrease iron and c/o SOB currently.  See recent medical issues.  Pt. not sleeping well on R side due to pain.      Limitations  Lifting;Writing;House hold activities    Patient Stated Goals  Increase R shoulder pain-free mobility.      Currently in Pain?  Yes    Pain Score  1     Pain Location  Shoulder    Pain Orientation  Right    Pain Descriptors / Indicators  Aching    Pain Type  Surgical pain    Pain Onset  Today          Treatment:  There.ex.:  B UBE 3 min. F/b  Nautilus: B sh. Adduction 30#/ lat. Pull downs20#/ triceps 30#/ scap. Retraction40#/ sh. Extension 30#/ chest press 40#/2x10 each Standing wall circles (clockwise/counter-clockwise) x20 each Standing Bodyblade movements in all planes of movement 20 sec  pain  Muscle weakness (generalized)  Shoulder joint stiffness, right     Problem List Patient Active Problem List   Diagnosis Date Noted  . Iron deficiency anemia 01/15/2018   Cammie Mcgee, PT, DPT # [id number] Nolon Bussing, SPT 07/25/2018, 2:04 PM  [place] [facility] [address] [place], [place], [address] Phone: [phone]   Fax:  [phone]  Name: Andrea Zhang MRN: [id number] Date of Birth: 1961/07/21

## 2018-07-25 NOTE — Patient Instructions (Signed)
Access Code: [email or web address]  URL: [email or web address]  Date: 07/25/2018  Prepared by: Dorene GrebeMichael Jayren Cease   Exercises  Standing Wall HunnewellBall Circles with Mini Swiss IndependenceBall - 10 reps - 2 sets - 1x daily - 7x weekly  Shoulder PNF D2 Flexion - 10 reps - 2 sets - 1x daily - 7x weekly  Standing Single Arm Shoulder PNF D1 Flexion - 10 reps - 2 sets - 1x daily - 7x weekly

## 2018-07-30 ENCOUNTER — Encounter: Payer: Self-pay | Admitting: Physical Therapy

## 2018-07-30 ENCOUNTER — Ambulatory Visit: Payer: BLUE CROSS/BLUE SHIELD | Admitting: Physical Therapy

## 2018-07-30 DIAGNOSIS — M6281 Muscle weakness (generalized): Secondary | ICD-10-CM

## 2018-07-30 DIAGNOSIS — G8929 Other chronic pain: Secondary | ICD-10-CM

## 2018-07-30 DIAGNOSIS — M25511 Pain in right shoulder: Secondary | ICD-10-CM | POA: Diagnosis not present

## 2018-07-30 DIAGNOSIS — M25611 Stiffness of right shoulder, not elsewhere classified: Secondary | ICD-10-CM

## 2018-07-30 NOTE — Therapy (Signed)
[place] [facility] [address] [place], [place], [address] Phone: [phone]   Fax:  [phone]  Physical Therapy Treatment  Patient Details  Name: Andrea Zhang MRN: [id number] Date of Birth: 1961-06-25 Referring Provider: Dr. Leron Croak   Encounter Date: 07/30/2018  PT End of Session - 07/30/18 0842    Visit Number  10    Number of Visits  16    Date for PT Re-Evaluation  08/20/18    PT Start Time  0808    PT Stop Time  0900    PT Time Calculation (min)  52 min    Activity Tolerance  Patient limited by pain;Patient tolerated treatment well    Behavior During Therapy  [facility] for tasks assessed/performed       Past Medical History:  Diagnosis Date  . Anemia    iron deficiency and b12 deficiency  . Anxiety   . Arthritis   . Asthma   . Diabetes mellitus without complication (HCC)   . GERD (gastroesophageal reflux disease)   . History of kidney stones   . Hypercholesterolemia   . Hypertension   . Migraines    MIGRAINES HAVE IMPROVED SINCE RECEIVING IRON    Past Surgical History:  Procedure Laterality Date  . CHOLECYSTECTOMY  2004  . COLONOSCOPY WITH ESOPHAGOGASTRODUODENOSCOPY (EGD)  02/2018  . ESOPHAGOGASTRODUODENOSCOPY (EGD) WITH PROPOFOL N/A 06/06/2016   Procedure: ESOPHAGOGASTRODUODENOSCOPY (EGD) WITH PROPOFOL;  Surgeon: Scot Jun, MD;  Location: [facility] ENDOSCOPY;  Service: Endoscopy;  Laterality: N/A;  . EXTRACORPOREAL SHOCK WAVE LITHOTRIPSY  2010  . JOINT REPLACEMENT  2013   LT TKR  . SAVORY DILATION N/A 06/06/2016   Procedure: SAVORY DILATION;  Surgeon: Scot Jun, MD;  Location: [facility] ENDOSCOPY;  Service: Endoscopy;  Laterality: N/A;  . SAVORY DILATION  02/2018  . SHOULDER ARTHROSCOPY WITH OPEN ROTATOR CUFF REPAIR Right 05/24/2018   Procedure: right shoulder arthroscopy, extensive arthroscopic debridement, decompression, open rotator cuff repair, biceps tenodesis;  Surgeon: Christena Flake, MD;  Location: [facility]  ORS;  Service: Orthopedics;  Laterality: Right;    There were no vitals filed for this visit.  Subjective Assessment - 07/30/18 0819    Subjective  Pt. reports that she cleaned over the weekend and arm was the best it has been since the surgery.  Pt. reports no new complaints/ pain upon entering in today.      Pertinent History  s/p L TKA 5 years ago (no issues).  Decrease iron and c/o SOB currently.  See recent medical issues.  Pt. not sleeping well on R side due to pain.      Limitations  Lifting;Writing;House hold activities    Patient Stated Goals  Increase R shoulder pain-free mobility.      Currently in Pain?  No/denies    Pain Score  0-No pain    Pain Orientation  Right    Pain Descriptors / Indicators  Aching    Pain Onset  Today         Treatment   There Ex:  Nautilus: 50# Lat Pull Down (15x2 with all ex.) 50# Tricep Ext. 50# B sh. Adduction with handles 50# Scapular Retraction 50# Chest Press  Standing BodyBlade in all planes of movement 30 sec x multiple bouts  PNF Patterns with 3# weights and RTB  Arm Bike 3 min forward/backward (not billed)  Reviewed HEP   PT Long Term Goals - 06/26/18 1713      PT LONG TERM GOAL #1  Title  Pt. independent with HEP to increase R shoulder AROM to WNL as compared to L shoulder to improve pain-free mobility with reaching.      Baseline  Supine R/L shoulder PROM: flexion (115/169 deg.), abduction (112/166 deg.), ER (50 deg./ WNL), IR (51 deg./ WNL).     Time  8    Period  Weeks    Status  New    Target Date  08/21/18      PT LONG TERM GOAL #2   Title  Pt. will increase FOTO to 66 to improve R shoulder mobilty.      Baseline  FOTO baseline 22 on 6/3    Time  8    Period  Weeks    Status  New    Target Date  08/21/18      PT LONG TERM GOAL #3   Title  Pt will decrease worst pain as reported on NPRS by at least 3 points in order to demonstrate clinically significant reduction in pain.     Baseline  Worst pain  8/10    Time  8    Period  Weeks    Status  New    Target Date  08/21/18      PT LONG TERM GOAL #4   Title  Pt will increase strength of shoulder flexion by at least 1/2 MMT grade in order to demonstrate improvement in strength and function     Baseline  Unable to assess at this time.    Time  8    Period  Weeks    Status  New    Target Date  08/21/18         Plan - 07/30/18 1603    Clinical Impression Statement  Pt. was able to increase weight on Nautilus without any increase in pain today.  Pt. notes marked increase in chest press without any pain reported.  Pt. encouraged to perform standing exercises with theraband at home with use of PNF patterns.  Pt. to continue strengthening exercises as therapy progresses.      Clinical Presentation  Stable    Clinical Decision Making  Low    Rehab Potential  Good    PT Frequency  2x / week    PT Duration  8 weeks    PT Treatment/Interventions  ADLs/Self Care Home Management;Cryotherapy;Electrical Stimulation;Moist Heat;Iontophoresis 4mg /ml Dexamethasone;Functional mobility training;Therapeutic activities;Therapeutic exercise;Patient/family education;Neuromuscular re-education;Manual techniques;Passive range of motion;Dry needling    PT Next Visit Plan  Manual therapy/ ther.ex. per MD protocol.      PT Home Exercise Plan  see HEP (handouts provided).      Consulted and Agree with Plan of Care  Patient       Patient will benefit from skilled therapeutic intervention in order to improve the following deficits and impairments:  Improper body mechanics, Pain, Postural dysfunction, Decreased mobility, Decreased activity tolerance, Decreased endurance, Decreased range of motion, Decreased strength, Hypomobility, Impaired UE functional use, Impaired flexibility  Visit Diagnosis: Chronic right shoulder pain  Muscle weakness (generalized)  Shoulder joint stiffness, right     Problem List Patient Active Problem List   Diagnosis Date Noted   . Iron deficiency anemia 01/15/2018    Cammie McgeeMichael C Sherk, PT, DPT # [id number] Nolon BussingJoshua Fredrica Capano, SPT 07/30/2018, 4:13 PM  [place] [facility] [address] [place], [place], [address] Phone: [phone]   Fax:  [phone]  Name: Andrea Zhang MRN: [id number] Date of Birth: [birth year]

## 2018-08-01 ENCOUNTER — Encounter: Payer: Self-pay | Admitting: Physical Therapy

## 2018-08-01 ENCOUNTER — Ambulatory Visit: Payer: BLUE CROSS/BLUE SHIELD | Admitting: Physical Therapy

## 2018-08-01 DIAGNOSIS — M25511 Pain in right shoulder: Principal | ICD-10-CM

## 2018-08-01 DIAGNOSIS — M6281 Muscle weakness (generalized): Secondary | ICD-10-CM

## 2018-08-01 DIAGNOSIS — G8929 Other chronic pain: Secondary | ICD-10-CM

## 2018-08-01 DIAGNOSIS — M25611 Stiffness of right shoulder, not elsewhere classified: Secondary | ICD-10-CM

## 2018-08-01 NOTE — Therapy (Signed)
Phone: [phone]   Fax:  [phone]  Name: Andrea Zhang MRN: [id number] Date of Birth: [birth year]  [facility] [address] [place], [place], [address] Phone: [phone]   Fax:  [phone]  Physical Therapy Treatment  Patient Details  Name: Andrea Zhang MRN: [id number] Date of Birth: [birth year] Referring Provider: Dr. Leron Croak   Encounter Date: 08/01/2018  PT End of Session - 08/01/18 1126    Visit Number  11    Number of Visits  16    Date for PT Re-Evaluation  08/20/18    PT Start Time  0810    PT Stop Time  0901    PT Time Calculation (min)  51 min    Activity Tolerance  Patient limited by pain;Patient tolerated treatment well    Behavior During Therapy  [facility] for tasks assessed/performed       Past Medical History:  Diagnosis Date  . Anemia    iron deficiency and b12 deficiency  . Anxiety   . Arthritis   . Asthma   . Diabetes mellitus without complication (HCC)   . GERD (gastroesophageal reflux disease)   . History of kidney stones   . Hypercholesterolemia   . Hypertension   . Migraines    MIGRAINES HAVE IMPROVED SINCE RECEIVING IRON    Past Surgical History:  Procedure Laterality Date  . CHOLECYSTECTOMY  2004  . COLONOSCOPY WITH ESOPHAGOGASTRODUODENOSCOPY (EGD)  02/2018  . ESOPHAGOGASTRODUODENOSCOPY (EGD) WITH PROPOFOL N/A 06/06/2016   Procedure: ESOPHAGOGASTRODUODENOSCOPY (EGD) WITH PROPOFOL;  Surgeon: Scot Jun, MD;  Location: [facility] ENDOSCOPY;  Service: Endoscopy;  Laterality: N/A;  . EXTRACORPOREAL SHOCK WAVE LITHOTRIPSY  2010  . JOINT REPLACEMENT  2013   LT TKR  . SAVORY DILATION N/A 06/06/2016   Procedure: SAVORY DILATION;  Surgeon: Scot Jun, MD;  Location: [facility] ENDOSCOPY;  Service: Endoscopy;  Laterality: N/A;  . SAVORY DILATION  02/2018  . SHOULDER ARTHROSCOPY WITH OPEN ROTATOR CUFF REPAIR Right 05/24/2018   Procedure: right shoulder arthroscopy, extensive arthroscopic debridement, decompression, open rotator cuff repair, biceps tenodesis;  Surgeon: Christena Flake, MD;  Location: [facility]  ORS;  Service: Orthopedics;  Laterality: Right;    There were no vitals filed for this visit.  Subjective Assessment - 08/01/18 0825    Subjective  Pt. reports no new complaints upon entering into the clinic.  Pt. states that she will be going to [place] over the weekend to visit family due to aunt passing away.  Pt. states that they will be driving the whole way there.     Pertinent History  s/p L TKA 5 years ago (no issues).  Decrease iron and c/o SOB currently.  See recent medical issues.  Pt. not sleeping well on R side due to pain.      Limitations  Lifting;Writing;House hold activities    Patient Stated Goals  Increase R shoulder pain-free mobility.      Currently in Pain?  No/denies    Pain Score  0-No pain    Pain Onset  Today        Treatment   There Ex:  Nautilus: 60# Lat Pull Down(2x15 with all ex.) 50# Tricep Ext. 40# B sh. Adduction with handles (2x10) 60# Scapular Retraction  50# Chest Press  Arm Bike 3 min forward/backward (not billed)   Manual:  Supine Grade II-III Joint Mobs to R UE (inf, PA, AP) 30 sec x multiple bouts Supine STM/Trigger Point Release to R Biceps, Pec, and Anterior Deltoid 30 sec x multiple bouts      PT Long Term Goals - 06/26/18  [facility] [address] [place], [place], [address] Phone: [phone]   Fax:  [phone]  Physical Therapy Treatment  Patient Details  Name: Andrea Zhang MRN: [id number] Date of Birth: [birth year] Referring Provider: Dr. Leron Croak   Encounter Date: 08/01/2018  PT End of Session - 08/01/18 1126    Visit Number  11    Number of Visits  16    Date for PT Re-Evaluation  08/20/18    PT Start Time  0810    PT Stop Time  0901    PT Time Calculation (min)  51 min    Activity Tolerance  Patient limited by pain;Patient tolerated treatment well    Behavior During Therapy  [facility] for tasks assessed/performed       Past Medical History:  Diagnosis Date  . Anemia    iron deficiency and b12 deficiency  . Anxiety   . Arthritis   . Asthma   . Diabetes mellitus without complication (HCC)   . GERD (gastroesophageal reflux disease)   . History of kidney stones   . Hypercholesterolemia   . Hypertension   . Migraines    MIGRAINES HAVE IMPROVED SINCE RECEIVING IRON    Past Surgical History:  Procedure Laterality Date  . CHOLECYSTECTOMY  2004  . COLONOSCOPY WITH ESOPHAGOGASTRODUODENOSCOPY (EGD)  02/2018  . ESOPHAGOGASTRODUODENOSCOPY (EGD) WITH PROPOFOL N/A 06/06/2016   Procedure: ESOPHAGOGASTRODUODENOSCOPY (EGD) WITH PROPOFOL;  Surgeon: Scot Jun, MD;  Location: [facility] ENDOSCOPY;  Service: Endoscopy;  Laterality: N/A;  . EXTRACORPOREAL SHOCK WAVE LITHOTRIPSY  2010  . JOINT REPLACEMENT  2013   LT TKR  . SAVORY DILATION N/A 06/06/2016   Procedure: SAVORY DILATION;  Surgeon: Scot Jun, MD;  Location: [facility] ENDOSCOPY;  Service: Endoscopy;  Laterality: N/A;  . SAVORY DILATION  02/2018  . SHOULDER ARTHROSCOPY WITH OPEN ROTATOR CUFF REPAIR Right 05/24/2018   Procedure: right shoulder arthroscopy, extensive arthroscopic debridement, decompression, open rotator cuff repair, biceps tenodesis;  Surgeon: Christena Flake, MD;  Location: [facility]  ORS;  Service: Orthopedics;  Laterality: Right;    There were no vitals filed for this visit.  Subjective Assessment - 08/01/18 0825    Subjective  Pt. reports no new complaints upon entering into the clinic.  Pt. states that she will be going to [place] over the weekend to visit family due to aunt passing away.  Pt. states that they will be driving the whole way there.     Pertinent History  s/p L TKA 5 years ago (no issues).  Decrease iron and c/o SOB currently.  See recent medical issues.  Pt. not sleeping well on R side due to pain.      Limitations  Lifting;Writing;House hold activities    Patient Stated Goals  Increase R shoulder pain-free mobility.      Currently in Pain?  No/denies    Pain Score  0-No pain    Pain Onset  Today        Treatment   There Ex:  Nautilus: 60# Lat Pull Down(2x15 with all ex.) 50# Tricep Ext. 40# B sh. Adduction with handles (2x10) 60# Scapular Retraction  50# Chest Press  Arm Bike 3 min forward/backward (not billed)   Manual:  Supine Grade II-III Joint Mobs to R UE (inf, PA, AP) 30 sec x multiple bouts Supine STM/Trigger Point Release to R Biceps, Pec, and Anterior Deltoid 30 sec x multiple bouts      PT Long Term Goals - 06/26/18

## 2018-08-09 ENCOUNTER — Ambulatory Visit: Payer: BLUE CROSS/BLUE SHIELD | Attending: Rheumatology | Admitting: Physical Therapy

## 2018-08-09 DIAGNOSIS — G8929 Other chronic pain: Secondary | ICD-10-CM | POA: Insufficient documentation

## 2018-08-09 DIAGNOSIS — M6281 Muscle weakness (generalized): Secondary | ICD-10-CM | POA: Diagnosis present

## 2018-08-09 DIAGNOSIS — M25511 Pain in right shoulder: Secondary | ICD-10-CM | POA: Insufficient documentation

## 2018-08-09 DIAGNOSIS — M25611 Stiffness of right shoulder, not elsewhere classified: Secondary | ICD-10-CM | POA: Insufficient documentation

## 2018-08-09 NOTE — Therapy (Signed)
[place] [facility] [address] [place], [place], [address] Phone: [phone]   Fax:  [phone]  Physical Therapy Treatment  Patient Details  Name: Nataleigh Griffin MRN: [id number] Date of Birth: 1961-02-28 Referring Provider (PT): Dr. Leron Croak   Encounter Date: 08/09/2018  PT End of Session - 08/09/18 0842    Visit Number  12    Number of Visits  16    Date for PT Re-Evaluation  08/20/18    PT Start Time  0821    PT Stop Time  0918    PT Time Calculation (min)  57 min    Activity Tolerance  Patient limited by pain;Patient tolerated treatment well    Behavior During Therapy  [facility] for tasks assessed/performed       Past Medical History:  Diagnosis Date  . Anemia    iron deficiency and b12 deficiency  . Anxiety   . Arthritis   . Asthma   . Diabetes mellitus without complication (HCC)   . GERD (gastroesophageal reflux disease)   . History of kidney stones   . Hypercholesterolemia   . Hypertension   . Migraines    MIGRAINES HAVE IMPROVED SINCE RECEIVING IRON    Past Surgical History:  Procedure Laterality Date  . CHOLECYSTECTOMY  2004  . COLONOSCOPY WITH ESOPHAGOGASTRODUODENOSCOPY (EGD)  02/2018  . ESOPHAGOGASTRODUODENOSCOPY (EGD) WITH PROPOFOL N/A 06/06/2016   Procedure: ESOPHAGOGASTRODUODENOSCOPY (EGD) WITH PROPOFOL;  Surgeon: Scot Jun, MD;  Location[facility] ENDOSCOPY;  Service: Endoscopy;  Laterality: N/A;  . EXTRACORPOREAL SHOCK WAVE LITHOTRIPSY  2010  . JOINT REPLACEMENT  2013   LT TKR  . SAVORY DILATION N/A 06/06/2016   Procedure: SAVORY DILATION;  Surgeon: Scot Jun, MD;  Location: [facility] ENDOSCOPY;  Service: Endoscopy;  Laterality: N/A;  . SAVORY DILATION  02/2018  . SHOULDER ARTHROSCOPY WITH OPEN ROTATOR CUFF REPAIR Right 05/24/2018   Procedure: right shoulder arthroscopy, extensive arthroscopic debridement, decompression, open rotator cuff repair, biceps tenodesis;  Surgeon: Christena Flake, MD;  Location:  [facility] ORS;  Service: Orthopedics;  Laterality: Right;    There were no vitals filed for this visit.  Subjective Assessment - 08/09/18 0823    Subjective  Pt. reports that she had a good trip up to [place] and that her arm was not an issue over the weekend.  Pt. reports that she kept ice available while riding to [place].      Pertinent History  s/p L TKA 5 years ago (no issues).  Decrease iron and c/o SOB currently.  See recent medical issues.  Pt. not sleeping well on R side due to pain.      Limitations  Lifting;Writing;House hold activities    Patient Stated Goals  Increase R shoulder pain-free mobility.      Currently in Pain?  No/denies    Pain Score  1     Pain Location  Shoulder    Pain Orientation  Right    Pain Descriptors / Indicators  Aching    Pain Onset  Today            Treatment   There Ex:  Nautilus: 60# Tricep Ext. (1x10) 60# Lat Pull Down(2x15 with remaining ex.) 40# B sh. Adduction with handles 60# Scapular Retraction  50# Chest Press  Standing BodyBlade in all planes of movement 30 sec x multiple bouts  Standing wall circles (clockwise/counter-clockwise) x20 each  Arm Bike 3 min forward/backward (not billed)  [facility] [address] [place], [place], [address] Phone: [phone]   Fax:  [phone]  Name: Emmeline Winebarger MRN: [id number] Date of Birth: 1961-07-06  [place] [facility] [address] [place], [place], [address] Phone: [phone]   Fax:  [phone]  Physical Therapy Treatment  Patient Details  Name: Nataleigh Griffin MRN: [id number] Date of Birth: 1961-02-28 Referring Provider (PT): Dr. Leron Croak   Encounter Date: 08/09/2018  PT End of Session - 08/09/18 0842    Visit Number  12    Number of Visits  16    Date for PT Re-Evaluation  08/20/18    PT Start Time  0821    PT Stop Time  0918    PT Time Calculation (min)  57 min    Activity Tolerance  Patient limited by pain;Patient tolerated treatment well    Behavior During Therapy  [facility] for tasks assessed/performed       Past Medical History:  Diagnosis Date  . Anemia    iron deficiency and b12 deficiency  . Anxiety   . Arthritis   . Asthma   . Diabetes mellitus without complication (HCC)   . GERD (gastroesophageal reflux disease)   . History of kidney stones   . Hypercholesterolemia   . Hypertension   . Migraines    MIGRAINES HAVE IMPROVED SINCE RECEIVING IRON    Past Surgical History:  Procedure Laterality Date  . CHOLECYSTECTOMY  2004  . COLONOSCOPY WITH ESOPHAGOGASTRODUODENOSCOPY (EGD)  02/2018  . ESOPHAGOGASTRODUODENOSCOPY (EGD) WITH PROPOFOL N/A 06/06/2016   Procedure: ESOPHAGOGASTRODUODENOSCOPY (EGD) WITH PROPOFOL;  Surgeon: Scot Jun, MD;  Location[facility] ENDOSCOPY;  Service: Endoscopy;  Laterality: N/A;  . EXTRACORPOREAL SHOCK WAVE LITHOTRIPSY  2010  . JOINT REPLACEMENT  2013   LT TKR  . SAVORY DILATION N/A 06/06/2016   Procedure: SAVORY DILATION;  Surgeon: Scot Jun, MD;  Location: [facility] ENDOSCOPY;  Service: Endoscopy;  Laterality: N/A;  . SAVORY DILATION  02/2018  . SHOULDER ARTHROSCOPY WITH OPEN ROTATOR CUFF REPAIR Right 05/24/2018   Procedure: right shoulder arthroscopy, extensive arthroscopic debridement, decompression, open rotator cuff repair, biceps tenodesis;  Surgeon: Christena Flake, MD;  Location:  [facility] ORS;  Service: Orthopedics;  Laterality: Right;    There were no vitals filed for this visit.  Subjective Assessment - 08/09/18 0823    Subjective  Pt. reports that she had a good trip up to [place] and that her arm was not an issue over the weekend.  Pt. reports that she kept ice available while riding to [place].      Pertinent History  s/p L TKA 5 years ago (no issues).  Decrease iron and c/o SOB currently.  See recent medical issues.  Pt. not sleeping well on R side due to pain.      Limitations  Lifting;Writing;House hold activities    Patient Stated Goals  Increase R shoulder pain-free mobility.      Currently in Pain?  No/denies    Pain Score  1     Pain Location  Shoulder    Pain Orientation  Right    Pain Descriptors / Indicators  Aching    Pain Onset  Today            Treatment   There Ex:  Nautilus: 60# Tricep Ext. (1x10) 60# Lat Pull Down(2x15 with remaining ex.) 40# B sh. Adduction with handles 60# Scapular Retraction  50# Chest Press  Standing BodyBlade in all planes of movement 30 sec x multiple bouts  Standing wall circles (clockwise/counter-clockwise) x20 each  Arm Bike 3 min forward/backward (not billed)

## 2018-08-10 ENCOUNTER — Encounter: Payer: Self-pay | Admitting: Physical Therapy

## 2018-08-14 ENCOUNTER — Ambulatory Visit: Payer: BLUE CROSS/BLUE SHIELD | Admitting: Physical Therapy

## 2018-08-14 DIAGNOSIS — G8929 Other chronic pain: Secondary | ICD-10-CM

## 2018-08-14 DIAGNOSIS — M6281 Muscle weakness (generalized): Secondary | ICD-10-CM

## 2018-08-14 DIAGNOSIS — M25511 Pain in right shoulder: Secondary | ICD-10-CM | POA: Diagnosis not present

## 2018-08-14 DIAGNOSIS — M25611 Stiffness of right shoulder, not elsewhere classified: Secondary | ICD-10-CM

## 2018-08-14 NOTE — Therapy (Signed)
[facility] [address] [place], [place], [address] Phone: [phone]   Fax:  [phone]  Physical Therapy Treatment  Patient Details  Name: Andrea Zhang MRN: [id number] Date of Birth: 02/28/1961 Referring Provider (PT): Dr. Leron Croak   Encounter Date: 08/14/2018  PT End of Session - 08/14/18 0846    Visit Number  13    Number of Visits  16    Date for PT Re-Evaluation  08/20/18    PT Start Time  0812    PT Stop Time  0904    PT Time Calculation (min)  52 min    Activity Tolerance  Patient limited by pain;Patient tolerated treatment well    Behavior During Therapy  [facility] for tasks assessed/performed       Past Medical History:  Diagnosis Date  . Anemia    iron deficiency and b12 deficiency  . Anxiety   . Arthritis   . Asthma   . Diabetes mellitus without complication (HCC)   . GERD (gastroesophageal reflux disease)   . History of kidney stones   . Hypercholesterolemia   . Hypertension   . Migraines    MIGRAINES HAVE IMPROVED SINCE RECEIVING IRON    Past Surgical History:  Procedure Laterality Date  . CHOLECYSTECTOMY  2004  . COLONOSCOPY WITH ESOPHAGOGASTRODUODENOSCOPY (EGD)  02/2018  . ESOPHAGOGASTRODUODENOSCOPY (EGD) WITH PROPOFOL N/A 06/06/2016   Procedure: ESOPHAGOGASTRODUODENOSCOPY (EGD) WITH PROPOFOL;  Surgeon: Scot Jun, MD;  Location: [facility] ENDOSCOPY;  Service: Endoscopy;  Laterality: N/A;  . EXTRACORPOREAL SHOCK WAVE LITHOTRIPSY  2010  . JOINT REPLACEMENT  2013   LT TKR  . SAVORY DILATION N/A 06/06/2016   Procedure: SAVORY DILATION;  Surgeon: Scot Jun, MD;  Location: [facility] ENDOSCOPY;  Service: Endoscopy;  Laterality: N/A;  . SAVORY DILATION  02/2018  . SHOULDER ARTHROSCOPY WITH OPEN ROTATOR CUFF REPAIR Right 05/24/2018   Procedure: right shoulder arthroscopy, extensive arthroscopic debridement, decompression, open rotator cuff repair, biceps tenodesis;  Surgeon: Christena Flake, MD;  Location:  [facility] ORS;  Service: Orthopedics;  Laterality: Right;    There were no vitals filed for this visit.  Subjective Assessment - 08/14/18 1325    Subjective  Pt. notes that she will be seeing MD tomorrow for update on shoulder.  Pt. reports no new complaints at this time and reports that her HEP has been going well and she has been consistently performing them.      Pertinent History  s/p L TKA 5 years ago (no issues).  Decrease iron and c/o SOB currently.  See recent medical issues.  Pt. not sleeping well on R side due to pain.      Limitations  Lifting;Writing;House hold activities    Patient Stated Goals  Increase R shoulder pain-free mobility.      Currently in Pain?  No/denies    Pain Onset  --       Treatment   There Ex:  Nautilus: 40# Tricep Ext. 2x20 60# Lat Pull Down(2x15with remaining ex.) 40# B sh. Adduction with handles 60# Scapular Retraction  50# Chest Press  Standing BodyBlade in all planes of movement 30 sec x multiple bouts  Standing wall circles (clockwise/counter-clockwise) x20 each  Arm Bike 3 min forward/backward (not billed)    Manual Therapy:  STM to UT, Cervical, and Scapula (14 min)        PT Long Term Goals - 06/26/18 1713      PT LONG TERM GOAL #1  Fax:  [phone]  Name: Andrea Zhang MRN: [id number] Date of Birth: [birth year]  Fax:  [phone]  Name: Andrea Zhang MRN: [id number] Date of Birth: [birth year]

## 2018-08-15 ENCOUNTER — Encounter: Payer: Self-pay | Admitting: Physical Therapy

## 2018-08-16 ENCOUNTER — Ambulatory Visit: Payer: BLUE CROSS/BLUE SHIELD | Admitting: Physical Therapy

## 2018-08-16 ENCOUNTER — Encounter: Payer: Self-pay | Admitting: Physical Therapy

## 2018-08-16 DIAGNOSIS — M25511 Pain in right shoulder: Secondary | ICD-10-CM | POA: Diagnosis not present

## 2018-08-16 DIAGNOSIS — M25611 Stiffness of right shoulder, not elsewhere classified: Secondary | ICD-10-CM

## 2018-08-16 DIAGNOSIS — G8929 Other chronic pain: Secondary | ICD-10-CM

## 2018-08-16 DIAGNOSIS — M6281 Muscle weakness (generalized): Secondary | ICD-10-CM

## 2018-08-16 NOTE — Patient Instructions (Signed)
Access Code: [email or web address]  URL: [email or web address]  Date: 08/16/2018  Prepared by: Dorene Grebe   Exercises  Standing Shoulder Flexion with Resistance - 10 reps - 3 sets - 1x daily - 7x weekly  Standing Single Arm Shoulder PNF D1 Flexion - 10 reps - 3 sets - 1x daily - 7x weekly  Shoulder PNF D2 Extension - 10 reps - 3 sets - 1x daily - 7x weekly  Seated Shoulder Horizontal Abduction with Dumbbells - Thumbs Up - 10 reps - 3 sets - 1x daily - 7x weekly  Seated Shoulder External Rotation with Dumbbells - 10 reps - 3 sets - 1x daily - 7x weekly  Supine Chest Press with Dumbbells - 10 reps - 3 sets - 1x daily - 7x weekly  Supine Scapular Protraction in Flexion with Dumbbells - 10 reps - 3 sets - 1x daily - 7x weekly  Standing Alternating Bicep Curls Neutral with Dumbbells - 10 reps - 3 sets - 1x daily - 7x weekly  Standing Alternating Triceps Extension with Dumbbells - 10 reps - 3 sets - 1x daily - 7x weekly

## 2018-08-16 NOTE — Therapy (Signed)
[place] [facility] [address] [place], [place], [address] Phone: [phone]   Fax:  [phone]  Physical Therapy Treatment  Patient Details  Name: Andrea Zhang MRN: [id number] Date of Birth: 1961-03-03 Referring Provider (PT): Dr. Milagros Evener   Encounter Date: 08/16/2018  PT End of Session - 08/16/18 0824    Visit Number  14    Number of Visits  16    Date for PT Re-Evaluation  08/20/18    PT Start Time  0822    PT Stop Time  0920    PT Time Calculation (min)  58 min    Activity Tolerance  Patient limited by pain;Patient tolerated treatment well    Behavior During Therapy  [facility] for tasks assessed/performed       Past Medical History:  Diagnosis Date  . Anemia    iron deficiency and b12 deficiency  . Anxiety   . Arthritis   . Asthma   . Diabetes mellitus without complication ([place])   . GERD (gastroesophageal reflux disease)   . History of kidney stones   . Hypercholesterolemia   . Hypertension   . Migraines    MIGRAINES HAVE IMPROVED SINCE RECEIVING IRON    Past Surgical History:  Procedure Laterality Date  . CHOLECYSTECTOMY  2004  . COLONOSCOPY WITH ESOPHAGOGASTRODUODENOSCOPY (EGD)  02/2018  . ESOPHAGOGASTRODUODENOSCOPY (EGD) WITH PROPOFOL N/A 06/06/2016   Procedure: ESOPHAGOGASTRODUODENOSCOPY (EGD) WITH PROPOFOL;  Surgeon: Manya Silvas, MD;  Location: [facility] ENDOSCOPY;  Service: Endoscopy;  Laterality: N/A;  . EXTRACORPOREAL SHOCK WAVE LITHOTRIPSY  2010  . JOINT REPLACEMENT  2013   LT TKR  . SAVORY DILATION N/A 06/06/2016   Procedure: SAVORY DILATION;  Surgeon: Manya Silvas, MD;  Location: [facility] ENDOSCOPY;  Service: Endoscopy;  Laterality: N/A;  . SAVORY DILATION  02/2018  . SHOULDER ARTHROSCOPY WITH OPEN ROTATOR CUFF REPAIR Right 05/24/2018   Procedure: right shoulder arthroscopy, extensive arthroscopic debridement, decompression, open rotator cuff repair, biceps tenodesis;  Surgeon: Corky Mull, MD;  Location:  [facility] ORS;  Service: Orthopedics;  Laterality: Right;    There were no vitals filed for this visit.  Subjective Assessment - 08/16/18 0823    Subjective  Pt. reports that MD was pleased ROM of R shoulder and pleased with strength.  Pt. wants to join local gym and is hoping to receive advice from therapist in order to guide her through the gym-exercise program.  Pt. reports no discomfort upon entering into the clinic.    Pertinent History  s/p L TKA 5 years ago (no issues).  Decrease iron and c/o SOB currently.  See recent medical issues.  Pt. not sleeping well on R side due to pain.      Limitations  Lifting;Writing;House hold activities    Patient Stated Goals  Increase R shoulder pain-free mobility.      Currently in Pain?  No/denies         Treatment   There Ex:  Nautilus: 40# Tricep Ext.x20 70# Lat Pull Down 40# B sh. Adduction with handles 70# Scapular Retraction  60# Chest Press  Standing BodyBlade in all planes of movement 30 sec x multiple bouts See NEW HEP Goal Reassessment was performed and noted below.  Pt. Is d/c at this time.     PT Long Term Goals - 08/16/18 0825      PT LONG TERM GOAL #1   Title  Pt. independent with HEP to increase R shoulder AROM to WNL as compared  to L shoulder to improve pain-free mobility with reaching.      Baseline  Supine R/L shoulder PROM: flexion (170/169 deg.), abduction (164/166 deg.), ER (63 deg./ WNL), IR (86 deg./ WNL).     Time  8    Period  Weeks    Status  Achieved    Target Date  08/21/18      PT LONG TERM GOAL #2   Title  Pt. will increase FOTO to 66 to improve R shoulder mobilty.      Baseline  FOTO baseline 22 on 6/3; 10/10 FOTO: 65     Time  8    Period  Weeks    Status  Partially Met    Target Date  08/21/18      PT LONG TERM GOAL #3   Title  Pt will decrease worst pain as reported on NPRS by at least 3 points in order to demonstrate clinically significant reduction in pain.     Baseline  Worst  pain: 3/10    Time  8    Period  Weeks    Status  Achieved    Target Date  08/21/18      PT LONG TERM GOAL #4   Title  Pt will increase strength of shoulder flexion by at least 1/2 MMT grade in order to demonstrate improvement in strength and function     Baseline  Shoulder Flex R/L: 4+/5, Abd: 4+/5    Time  8    Period  Weeks    Status  Achieved    Target Date  08/21/18            Plan - 08/16/18 1257    Clinical Impression Statement  Pt. has made significant improvements over the past several weeks and is doing well enough to return to gym-based exercises.  Pt. has made considerable improvements in functional ROM, strength, and reduction in pain.  Pt. has seen MD for reassessment and is pleased with results.  Pt. encouraged to visit clinic with any concerns or if regression occurs.  Pt. to be d/c at this time.    Clinical Presentation  Stable    Clinical Decision Making  Low    Rehab Potential  Good    PT Frequency  2x / week    PT Duration  8 weeks    PT Treatment/Interventions  ADLs/Self Care Home Management;Cryotherapy;Electrical Stimulation;Moist Heat;Iontophoresis 50m/ml Dexamethasone;Functional mobility training;Therapeutic activities;Therapeutic exercise;Patient/family education;Neuromuscular re-education;Manual techniques;Passive range of motion;Dry needling    PT Next Visit Plan  Discharge    PT Home Exercise Plan  see HEP (handouts provided).      Consulted and Agree with Plan of Care  Patient       Patient will benefit from skilled therapeutic intervention in order to improve the following deficits and impairments:  Improper body mechanics, Pain, Postural dysfunction, Decreased mobility, Decreased activity tolerance, Decreased endurance, Decreased range of motion, Decreased strength, Hypomobility, Impaired UE functional use, Impaired flexibility  Visit Diagnosis: Chronic right shoulder pain  Muscle weakness (generalized)  Shoulder joint stiffness,  right     Problem List Patient Active Problem List   Diagnosis Date Noted  . Iron deficiency anemia 01/15/2018   MPura Spice PT, DPT # [id number] Saran SPT 08/17/2018, 12:42 PM  [place] [facility] [address]Phone: [phone]  Fax:  [phone] Name: MDayana DalportoMRN: [id number] of Birth: 1April 22, 1962

## 2018-08-21 ENCOUNTER — Encounter: Payer: BLUE CROSS/BLUE SHIELD | Admitting: Physical Therapy

## 2018-08-23 ENCOUNTER — Encounter: Payer: BLUE CROSS/BLUE SHIELD | Admitting: Physical Therapy

## 2018-09-10 ENCOUNTER — Inpatient Hospital Stay: Payer: BLUE CROSS/BLUE SHIELD | Attending: Oncology | Admitting: Oncology

## 2018-09-10 ENCOUNTER — Inpatient Hospital Stay: Payer: BLUE CROSS/BLUE SHIELD

## 2018-09-10 ENCOUNTER — Encounter: Payer: Self-pay | Admitting: Oncology

## 2018-09-10 VITALS — BP 100/71 | HR 67 | Temp 96.5°F | Resp 18 | Ht 60.0 in | Wt 228.0 lb

## 2018-09-10 DIAGNOSIS — E538 Deficiency of other specified B group vitamins: Secondary | ICD-10-CM

## 2018-09-10 DIAGNOSIS — K219 Gastro-esophageal reflux disease without esophagitis: Secondary | ICD-10-CM | POA: Diagnosis not present

## 2018-09-10 DIAGNOSIS — D509 Iron deficiency anemia, unspecified: Secondary | ICD-10-CM

## 2018-09-10 DIAGNOSIS — Z79899 Other long term (current) drug therapy: Secondary | ICD-10-CM

## 2018-09-10 DIAGNOSIS — Z7984 Long term (current) use of oral hypoglycemic drugs: Secondary | ICD-10-CM | POA: Insufficient documentation

## 2018-09-10 DIAGNOSIS — Z7982 Long term (current) use of aspirin: Secondary | ICD-10-CM | POA: Insufficient documentation

## 2018-09-10 DIAGNOSIS — I10 Essential (primary) hypertension: Secondary | ICD-10-CM | POA: Diagnosis not present

## 2018-09-10 DIAGNOSIS — E119 Type 2 diabetes mellitus without complications: Secondary | ICD-10-CM | POA: Diagnosis not present

## 2018-09-10 LAB — CBC
HEMATOCRIT: 36.1 % (ref 36.0–46.0)
HEMOGLOBIN: 11 g/dL — AB (ref 12.0–15.0)
MCH: 27.8 pg (ref 26.0–34.0)
MCHC: 30.5 g/dL (ref 30.0–36.0)
MCV: 91.2 fL (ref 80.0–100.0)
NRBC: 0 % (ref 0.0–0.2)
Platelets: 326 10*3/uL (ref 150–400)
RBC: 3.96 MIL/uL (ref 3.87–5.11)
RDW: 14.9 % (ref 11.5–15.5)
WBC: 6.8 10*3/uL (ref 4.0–10.5)

## 2018-09-10 LAB — FERRITIN: Ferritin: 15 ng/mL (ref 11–307)

## 2018-09-10 LAB — IRON AND TIBC
Iron: 81 ug/dL (ref 28–170)
Saturation Ratios: 19 % (ref 10.4–31.8)
TIBC: 437 ug/dL (ref 250–450)
UIBC: 356 ug/dL

## 2018-09-10 LAB — VITAMIN B12: Vitamin B-12: 1467 pg/mL — ABNORMAL HIGH (ref 180–914)

## 2018-09-10 NOTE — Progress Notes (Signed)
Tired but has finished PT from her rotator cuff the first week of October, April was when fascitits of left foot started- in a boot now and he has been working on it.

## 2018-09-10 NOTE — Progress Notes (Signed)
Hematology/Oncology Consult note [facility]  [phone] [phone]  Patient Care Team: Myrene Buddy, NP as PCP - General (Internal Medicine)   Name of the patient: Andrea Zhang  [id number]  11-Aug-1961   Date of visit: 09/10/18  Diagnosis-iron deficiency anemia  Chief complaint/ Reason for visit-routine follow-up of iron deficiency anemia  Heme/Onc history: patient is a 57 year old female with a past medical history significant for hypertension hyperlipidemia, asthma diabetes osteoarthritis among other medical problems. She also has chronic arthritis for which she sees Dr. Gavin Potters and has been getting joint injections. She was seen by her PCP on 01/12/2018 with symptoms of lightheadedness and worsening shortness of breath. She has been earlier seen by Dr. Lady Gary from cardiology on 12/19/2017 and underwent stress test and echocardiogram that was normal. Blood work done on 01/12/2018 was as follows: CBC showed white count of 8.4, H&H of 6.7/22.6 with an MCV of 81 and a platelet count of 324. Differential on the CBC was normal. Iron study showed TIBC that was elevated at 546. Percentage iron saturation was 36. Ferritin levels were not checked. CMP and TSH were within normal. B12 levels in November 2018 were low at 241. Upper endoscopy from July 27 showed a Schatzki's ring which was dilated and evidence of gastritis. Duodenum was normal. She has had 2-3 EGDs in the past for strictures requiring dilatation. She has had colonoscopy back in 2011 which was apparently normal.  Results of blood work from 01/15/2018 were as follows: CBC showed white count of 7.4, H&H of 7.5/24 with an MCV of 78.2 and a platelet count of 299.  Ferritin levels were low at 7.  Iron studies showed a low iron saturation and elevated TIBC of 560 haptoglobin was normal, celiac disease panel was negative, myeloma panel revealed no monoclonal protein.  B12 level was  low low at 210.  Reticulocyte count was elevated at 7.5 indicating response to anemia.  Folate level was normal at 7.6.  Urinalysis did not reveal any hematuria.  Stool H. pylori antigen was negative  Patient was seen by [facility] GI and underwent EGD and colonoscopy in May 2019 which was apparently unremarkable.  Patient has not had a capsule endoscopy done yet.  She received 2 doses of Feraheme in March 2019 as well as 3 doses of B12.  Repeat CBC from 02/05/2018 showed H&H of 9.6/30.8  Interval history-she reports feeling well and denies any blood in her stool or urine.  Denies any dark melanotic stools.  Her left leg is currently in a boot due to plantar fasciitis for which she is undergoing physical therapy  ECOG PS- 1 Pain scale- 0  Review of systems- Review of Systems  Constitutional: Positive for malaise/fatigue. Negative for chills, fever and weight loss.  HENT: Negative for congestion, ear discharge and nosebleeds.   Eyes: Negative for blurred vision.  Respiratory: Negative for cough, hemoptysis, sputum production, shortness of breath and wheezing.   Cardiovascular: Negative for chest pain, palpitations, orthopnea and claudication.  Gastrointestinal: Negative for abdominal pain, blood in stool, constipation, diarrhea, heartburn, melena, nausea and vomiting.  Genitourinary: Negative for dysuria, flank pain, frequency, hematuria and urgency.  Musculoskeletal: Negative for back pain, joint pain and myalgias.  Skin: Negative for rash.  Neurological: Negative for dizziness, tingling, focal weakness, seizures, weakness and headaches.  Endo/Heme/Allergies: Does not bruise/bleed easily.  Psychiatric/Behavioral: Negative for depression and suicidal ideas. The patient does not have insomnia.       Allergies  Allergen Reactions  . Iodinated Diagnostic Agents Other (See Comments)    Becomes 'unresponsive' to ORAL and IV DYE BETADINE ON THE SKIN IS OKAY  . Diclofenac Hives    HORRIBLE  RASH with both PATCH OR CREAM  . Cephalexin Rash  . Maxalt [Rizatriptan Benzoate] Other (See Comments)    'Heart Races'  . Orphenadrine Citrate Other (See Comments)    Patient unsure of this allergy.  Marland Kitchen Zithromax [Azithromycin] Other (See Comments)    Severe Abdominal Cramps  . Zomig [Zolmitriptan] Other (See Comments)    'Heart Races'     Past Medical History:  Diagnosis Date  . Anemia    iron deficiency and b12 deficiency  . Anxiety   . Arthritis   . Asthma   . Diabetes mellitus without complication ([facility])   . Fasciitis    left foot  . GERD (gastroesophageal reflux disease)   . History of kidney stones   . Hypercholesterolemia   . Hypertension   . Migraines    MIGRAINES HAVE IMPROVED SINCE RECEIVING IRON     Past Surgical History:  Procedure Laterality Date  . CHOLECYSTECTOMY  2004  . COLONOSCOPY WITH ESOPHAGOGASTRODUODENOSCOPY (EGD)  02/2018  . ESOPHAGOGASTRODUODENOSCOPY (EGD) WITH PROPOFOL N/A 06/06/2016   Procedure: ESOPHAGOGASTRODUODENOSCOPY (EGD) WITH PROPOFOL;  Surgeon: Scot Jun, MD;  Location: [facility] ENDOSCOPY;  Service: Endoscopy;  Laterality: N/A;  . EXTRACORPOREAL SHOCK WAVE LITHOTRIPSY  2010  . JOINT REPLACEMENT  2013   LT TKR  . SAVORY DILATION N/A 06/06/2016   Procedure: SAVORY DILATION;  Surgeon: Scot Jun, MD;  Location: [facility] ENDOSCOPY;  Service: Endoscopy;  Laterality: N/A;  . SAVORY DILATION  02/2018  . SHOULDER ARTHROSCOPY WITH OPEN ROTATOR CUFF REPAIR Right 05/24/2018   Procedure: right shoulder arthroscopy, extensive arthroscopic debridement, decompression, open rotator cuff repair, biceps tenodesis;  Surgeon: Christena Flake, MD;  Location: [facility] ORS;  Service: Orthopedics;  Laterality: Right;    Social History   Socioeconomic History  . Marital status: Married    Spouse name: Not on file  . Number of children: Not on file  . Years of education: Not on file  . Highest education level: Not on file  Occupational History  . Not on  file  Social Needs  . Financial resource strain: Not on file  . Food insecurity:    Worry: Not on file    Inability: Not on file  . Transportation needs:    Medical: Not on file    Non-medical: Not on file  Tobacco Use  . Smoking status: Never Smoker  . Smokeless tobacco: Never Used  Substance and Sexual Activity  . Alcohol use: No  . Drug use: No  . Sexual activity: Not Currently  Lifestyle  . Physical activity:    Days per week: Not on file    Minutes per session: Not on file  . Stress: Not on file  Relationships  . Social connections:    Talks on phone: Not on file    Gets together: Not on file    Attends religious service: Not on file    Active member of club or organization: Not on file    Attends meetings of clubs or organizations: Not on file    Relationship status: Not on file  . Intimate partner violence:    Fear of current or ex partner: Not on file    Emotionally abused: Not on file    Physically abused: Not on file    Forced sexual  Hematology/Oncology Consult note [facility]  [phone] [phone]  Patient Care Team: [name], NP as PCP - General (Internal Medicine)   Name of the patient: Andrea Zhang  [id number]  July 24, 1961   Date of visit: 09/10/18  Diagnosis-iron deficiency anemia  Chief complaint/ Reason for visit-routine follow-up of iron deficiency anemia  Heme/Onc history: patient is a 57 year old female with a past medical history significant for hypertension hyperlipidemia, asthma diabetes osteoarthritis among other medical problems. She also has chronic arthritis for which she sees Dr. Jefm Bryant and has been getting joint injections. She was seen by her PCP on 01/12/2018 with symptoms of lightheadedness and worsening shortness of breath. She has been earlier seen by Dr. Ubaldo Glassing from cardiology on 12/19/2017 and underwent stress test and echocardiogram that was normal. Blood work done on 01/12/2018 was as follows: CBC showed white count of 8.4, H&H of 6.7/22.6 with an MCV of 81 and a platelet count of 324. Differential on the CBC was normal. Iron study showed TIBC that was elevated at 546. Percentage iron saturation was 36. Ferritin levels were not checked. CMP and TSH were within normal. B12 levels in November 2018 were low at 241. Upper endoscopy from July 27 showed a Schatzki's ring which was dilated and evidence of gastritis. Duodenum was normal. She has had 2-3 EGDs in the past for strictures requiring dilatation. She has had colonoscopy back in 2011 which was apparently normal.  Results of blood work from 01/15/2018 were as follows: CBC showed white count of 7.4, H&H of 7.5/24 with an MCV of 78.2 and a platelet count of 299.  Ferritin levels were low at 7.  Iron studies showed a low iron saturation and elevated TIBC of 560 haptoglobin was normal, celiac disease panel was negative, myeloma panel revealed no monoclonal protein.  B12 level was  low low at 210.  Reticulocyte count was elevated at 7.5 indicating response to anemia.  Folate level was normal at 7.6.  Urinalysis did not reveal any hematuria.  Stool H. pylori antigen was negative  Patient was seen by [facility] clinic GI and underwent EGD and colonoscopy in May 2019 which was apparently unremarkable.  Patient has not had a capsule endoscopy done yet.  She received 2 doses of Feraheme in March 2019 as well as 3 doses of B12.  Repeat CBC from 02/05/2018 showed H&H of 9.6/30.8  Interval history-she reports feeling well and denies any blood in her stool or urine.  Denies any dark melanotic stools.  Her left leg is currently in a boot due to plantar fasciitis for which she is undergoing physical therapy  ECOG PS- 1 Pain scale- 0  Review of systems- Review of Systems  Constitutional: Positive for malaise/fatigue. Negative for chills, fever and weight loss.  HENT: Negative for congestion, ear discharge and nosebleeds.   Eyes: Negative for blurred vision.  Respiratory: Negative for cough, hemoptysis, sputum production, shortness of breath and wheezing.   Cardiovascular: Negative for chest pain, palpitations, orthopnea and claudication.  Gastrointestinal: Negative for abdominal pain, blood in stool, constipation, diarrhea, heartburn, melena, nausea and vomiting.  Genitourinary: Negative for dysuria, flank pain, frequency, hematuria and urgency.  Musculoskeletal: Negative for back pain, joint pain and myalgias.  Skin: Negative for rash.  Neurological: Negative for dizziness, tingling, focal weakness, seizures, weakness and headaches.  Endo/Heme/Allergies: Does not bruise/bleed easily.  Psychiatric/Behavioral: Negative for depression and suicidal ideas. The patient does not have insomnia.       Allergies  light. EOM are normal.  Neck: Normal range of motion.  Cardiovascular: Normal rate, regular rhythm and normal heart sounds.  Pulmonary/Chest: Effort normal and breath sounds normal.  Abdominal: Soft. Bowel sounds are normal.  Neurological: She is alert and oriented to person, place, and time.  Skin: Skin is warm and dry.     CMP Latest Ref Rng & Units 05/17/2018  Glucose 70 - 99 mg/dL 96  BUN 6 - 20 mg/dL 15  Creatinine 1.61 - 0.96 mg/dL 0.45  Sodium 409 - 811 mmol/L 139  Potassium 3.5 - 5.1 mmol/L 4.4  Chloride 98 - 111 mmol/L 103  CO2 22 - 32 mmol/L 30  Calcium 8.9 - 10.3 mg/dL 91.4  Total Protein 6.4 - 8.2 g/dL -  Total Bilirubin 0.2 - 1.0 mg/dL -  Alkaline Phos Unit/L -  AST 15 - 37 Unit/L -  ALT 12 - 78 U/L -   CBC Latest Ref Rng & Units 09/10/2018  WBC 4.0 - 10.5 K/uL 6.8  Hemoglobin 12.0 - 15.0 g/dL 11.0(L)  Hematocrit 36.0 - 46.0 % 36.1  Platelets 150 - 400 K/uL 326     Assessment and plan- Patient is a 57 y.o. female with iron and B12 deficiency anemia  After receiving 2 doses of Feraheme patient's hemoglobin significantly improved from 8-13.  Has dropped back down to 11 today.  Iron studies from today are pending.  If iron studies revealed iron deficiency we will call her and set her up for more Feraheme.  I have also encouraged her to get in touch with [facility] clinic GI to get her capsule endoscopy done soon.  B12 deficiency: She received B12 shots in the past and is currently receiving oral B12.  B12 levels from today are pending  Repeat CBC ferritin and iron studies in 3 in 6 months and I will see her back in 6 months   Visit Diagnosis 1. Iron deficiency anemia, unspecified iron deficiency anemia type   2. B12 deficiency      Dr. Owens Shark, MD, MPH [facility] [phone] 09/10/2018 1:40 PM

## 2018-09-11 ENCOUNTER — Telehealth: Payer: Self-pay

## 2018-09-11 NOTE — Telephone Encounter (Signed)
Spoke with the patient to infrom her of her low ferritin levels and if she could go to [place] clinic for the capsule study asap. The patient was agreeable to get feraheme treatment 2 doses.

## 2018-09-11 NOTE — Telephone Encounter (Signed)
-----   Message from Creig Hines, MD sent at 09/11/2018  7:51 AM EST ----- Ferritin runnin low. She needs 2 doses of feraheme in [place]. Please ask her to get capsule study done with [facility] clinic asap. Thanks, Ovidio Kin

## 2018-09-13 ENCOUNTER — Inpatient Hospital Stay: Payer: BLUE CROSS/BLUE SHIELD

## 2018-09-13 ENCOUNTER — Other Ambulatory Visit: Payer: Self-pay | Admitting: Oncology

## 2018-09-13 VITALS — BP 113/75 | HR 71 | Temp 96.6°F | Resp 18

## 2018-09-13 DIAGNOSIS — D509 Iron deficiency anemia, unspecified: Secondary | ICD-10-CM

## 2018-09-13 MED ORDER — SODIUM CHLORIDE 0.9 % IV SOLN
Freq: Once | INTRAVENOUS | Status: AC
  Administered 2018-09-13: 10:00:00 via INTRAVENOUS
  Filled 2018-09-13: qty 250

## 2018-09-13 MED ORDER — SODIUM CHLORIDE 0.9 % IV SOLN
510.0000 mg | Freq: Once | INTRAVENOUS | Status: AC
  Administered 2018-09-13: 510 mg via INTRAVENOUS
  Filled 2018-09-13: qty 17

## 2018-09-13 NOTE — Patient Instructions (Signed)

## 2018-09-20 ENCOUNTER — Ambulatory Visit: Payer: BLUE CROSS/BLUE SHIELD

## 2018-09-27 ENCOUNTER — Inpatient Hospital Stay: Payer: BLUE CROSS/BLUE SHIELD

## 2018-09-27 VITALS — BP 105/70 | HR 80 | Temp 97.0°F | Resp 18

## 2018-09-27 DIAGNOSIS — D509 Iron deficiency anemia, unspecified: Secondary | ICD-10-CM | POA: Diagnosis not present

## 2018-09-27 MED ORDER — SODIUM CHLORIDE 0.9 % IV SOLN
Freq: Once | INTRAVENOUS | Status: AC
  Administered 2018-09-27: 10:00:00 via INTRAVENOUS
  Filled 2018-09-27: qty 250

## 2018-09-27 MED ORDER — SODIUM CHLORIDE 0.9 % IV SOLN
510.0000 mg | Freq: Once | INTRAVENOUS | Status: AC
  Administered 2018-09-27: 510 mg via INTRAVENOUS
  Filled 2018-09-27: qty 17

## 2018-09-27 NOTE — Patient Instructions (Signed)

## 2018-10-23 ENCOUNTER — Telehealth: Payer: Self-pay | Admitting: *Deleted

## 2018-10-23 DIAGNOSIS — D509 Iron deficiency anemia, unspecified: Secondary | ICD-10-CM

## 2018-10-23 NOTE — Telephone Encounter (Signed)
Patient informed of lab orders and agrees to appointment. [place] to come tomorrow in [place], appointment accepted for lab only

## 2018-10-23 NOTE — Telephone Encounter (Signed)
Patient called reporting that she is feeling light headed and is asking if we will check her labs to see if her iron is low, Please adivse

## 2018-10-23 NOTE — Telephone Encounter (Signed)
Sure she can come and get cbc, ferritin and iron studies checked in [place].

## 2018-10-24 ENCOUNTER — Telehealth: Payer: Self-pay | Admitting: *Deleted

## 2018-10-24 ENCOUNTER — Other Ambulatory Visit: Payer: Self-pay | Admitting: Oncology

## 2018-10-24 ENCOUNTER — Inpatient Hospital Stay: Payer: BLUE CROSS/BLUE SHIELD | Attending: Oncology

## 2018-10-24 DIAGNOSIS — D509 Iron deficiency anemia, unspecified: Secondary | ICD-10-CM | POA: Insufficient documentation

## 2018-10-24 LAB — CBC WITH DIFFERENTIAL/PLATELET
ABS IMMATURE GRANULOCYTES: 0.04 10*3/uL (ref 0.00–0.07)
BASOS PCT: 0 %
Basophils Absolute: 0 10*3/uL (ref 0.0–0.1)
Eosinophils Absolute: 0.1 10*3/uL (ref 0.0–0.5)
Eosinophils Relative: 3 %
HEMATOCRIT: 35.3 % — AB (ref 36.0–46.0)
Hemoglobin: 10.9 g/dL — ABNORMAL LOW (ref 12.0–15.0)
Immature Granulocytes: 1 %
LYMPHS ABS: 0.7 10*3/uL (ref 0.7–4.0)
Lymphocytes Relative: 14 %
MCH: 29.1 pg (ref 26.0–34.0)
MCHC: 30.9 g/dL (ref 30.0–36.0)
MCV: 94.1 fL (ref 80.0–100.0)
MONOS PCT: 7 %
Monocytes Absolute: 0.4 10*3/uL (ref 0.1–1.0)
NEUTROS ABS: 4 10*3/uL (ref 1.7–7.7)
Neutrophils Relative %: 75 %
PLATELETS: 255 10*3/uL (ref 150–400)
RBC: 3.75 MIL/uL — ABNORMAL LOW (ref 3.87–5.11)
RDW: 15.4 % (ref 11.5–15.5)
WBC: 5.4 10*3/uL (ref 4.0–10.5)
nRBC: 0 % (ref 0.0–0.2)

## 2018-10-24 LAB — IRON AND TIBC
IRON: 43 ug/dL (ref 28–170)
Saturation Ratios: 12 % (ref 10.4–31.8)
TIBC: 375 ug/dL (ref 250–450)
UIBC: 332 ug/dL

## 2018-10-24 LAB — FERRITIN: FERRITIN: 60 ng/mL (ref 11–307)

## 2018-10-24 NOTE — Telephone Encounter (Signed)
Dr. Smith Robertao called me to tell me that her hgb is lower and she is not going for endoscopy in jan. She will need 2 more doses of feraheme. I checked with Merry ProudBrandi and she says she is good to get the feraheme. I called the patient and no answer but then tried calling the house phone. Explained to patient about labs results and what Smith RobertRao wanted and she is agreeable to the feraheme. I have sent message to robin in [place] and asked her to make her 2 appts and call patient with the appts. Pt understands.

## 2018-10-26 ENCOUNTER — Inpatient Hospital Stay: Payer: BLUE CROSS/BLUE SHIELD

## 2018-10-26 VITALS — BP 105/72 | HR 67 | Temp 97.2°F | Resp 18

## 2018-10-26 DIAGNOSIS — D509 Iron deficiency anemia, unspecified: Secondary | ICD-10-CM

## 2018-10-26 MED ORDER — SODIUM CHLORIDE 0.9 % IV SOLN
510.0000 mg | Freq: Once | INTRAVENOUS | Status: AC
  Administered 2018-10-26: 510 mg via INTRAVENOUS
  Filled 2018-10-26: qty 17

## 2018-10-26 MED ORDER — SODIUM CHLORIDE 0.9 % IV SOLN
Freq: Once | INTRAVENOUS | Status: AC
  Administered 2018-10-26: 10:00:00 via INTRAVENOUS
  Filled 2018-10-26: qty 250

## 2018-10-26 NOTE — Patient Instructions (Signed)

## 2018-11-02 ENCOUNTER — Inpatient Hospital Stay: Payer: BLUE CROSS/BLUE SHIELD

## 2018-11-02 VITALS — BP 111/74 | HR 76 | Temp 96.9°F | Resp 18

## 2018-11-02 DIAGNOSIS — D509 Iron deficiency anemia, unspecified: Secondary | ICD-10-CM

## 2018-11-02 MED ORDER — SODIUM CHLORIDE 0.9 % IV SOLN
Freq: Once | INTRAVENOUS | Status: AC
  Administered 2018-11-02: 11:00:00 via INTRAVENOUS
  Filled 2018-11-02: qty 250

## 2018-11-02 MED ORDER — SODIUM CHLORIDE 0.9 % IV SOLN
510.0000 mg | Freq: Once | INTRAVENOUS | Status: AC
  Administered 2018-11-02: 510 mg via INTRAVENOUS
  Filled 2018-11-02: qty 17

## 2018-12-10 ENCOUNTER — Inpatient Hospital Stay: Payer: BLUE CROSS/BLUE SHIELD | Attending: Hematology and Oncology

## 2018-12-10 DIAGNOSIS — D509 Iron deficiency anemia, unspecified: Secondary | ICD-10-CM | POA: Diagnosis not present

## 2018-12-10 LAB — CBC
HCT: 40.8 % (ref 36.0–46.0)
Hemoglobin: 12.9 g/dL (ref 12.0–15.0)
MCH: 30.1 pg (ref 26.0–34.0)
MCHC: 31.6 g/dL (ref 30.0–36.0)
MCV: 95.3 fL (ref 80.0–100.0)
Platelets: 300 10*3/uL (ref 150–400)
RBC: 4.28 MIL/uL (ref 3.87–5.11)
RDW: 14.9 % (ref 11.5–15.5)
WBC: 6.6 10*3/uL (ref 4.0–10.5)
nRBC: 0 % (ref 0.0–0.2)

## 2018-12-10 LAB — IRON AND TIBC
Iron: 63 ug/dL (ref 28–170)
SATURATION RATIOS: 16 % (ref 10.4–31.8)
TIBC: 385 ug/dL (ref 250–450)
UIBC: 322 ug/dL

## 2018-12-10 LAB — FERRITIN: Ferritin: 96 ng/mL (ref 11–307)

## 2018-12-12 ENCOUNTER — Telehealth: Payer: Self-pay

## 2018-12-12 NOTE — Telephone Encounter (Signed)
Patient called requesting Iron, Ferritin, Hgb, and Hct levels. Informed patient of results and that all labs improved from previous visit. Pt verifies understanding and denies any further questions or concerns.

## 2019-01-14 ENCOUNTER — Other Ambulatory Visit: Payer: Self-pay | Admitting: Nurse Practitioner

## 2019-01-14 DIAGNOSIS — Z1231 Encounter for screening mammogram for malignant neoplasm of breast: Secondary | ICD-10-CM

## 2019-01-31 ENCOUNTER — Other Ambulatory Visit: Payer: Self-pay

## 2019-01-31 ENCOUNTER — Inpatient Hospital Stay: Payer: BLUE CROSS/BLUE SHIELD | Attending: Hematology and Oncology

## 2019-01-31 DIAGNOSIS — E7800 Pure hypercholesterolemia, unspecified: Secondary | ICD-10-CM | POA: Insufficient documentation

## 2019-01-31 DIAGNOSIS — D509 Iron deficiency anemia, unspecified: Secondary | ICD-10-CM

## 2019-01-31 DIAGNOSIS — K219 Gastro-esophageal reflux disease without esophagitis: Secondary | ICD-10-CM | POA: Diagnosis not present

## 2019-01-31 DIAGNOSIS — E538 Deficiency of other specified B group vitamins: Secondary | ICD-10-CM | POA: Diagnosis not present

## 2019-01-31 DIAGNOSIS — D508 Other iron deficiency anemias: Secondary | ICD-10-CM

## 2019-01-31 DIAGNOSIS — I10 Essential (primary) hypertension: Secondary | ICD-10-CM | POA: Diagnosis not present

## 2019-01-31 DIAGNOSIS — D649 Anemia, unspecified: Secondary | ICD-10-CM

## 2019-01-31 LAB — FERRITIN: Ferritin: 22 ng/mL (ref 11–307)

## 2019-01-31 LAB — CBC WITH DIFFERENTIAL/PLATELET
Abs Immature Granulocytes: 0.05 10*3/uL (ref 0.00–0.07)
Basophils Absolute: 0 10*3/uL (ref 0.0–0.1)
Basophils Relative: 0 %
Eosinophils Absolute: 0.2 10*3/uL (ref 0.0–0.5)
Eosinophils Relative: 4 %
HCT: 37.2 % (ref 36.0–46.0)
Hemoglobin: 11.5 g/dL — ABNORMAL LOW (ref 12.0–15.0)
Immature Granulocytes: 1 %
Lymphocytes Relative: 19 %
Lymphs Abs: 1 10*3/uL (ref 0.7–4.0)
MCH: 29.4 pg (ref 26.0–34.0)
MCHC: 30.9 g/dL (ref 30.0–36.0)
MCV: 95.1 fL (ref 80.0–100.0)
Monocytes Absolute: 0.5 10*3/uL (ref 0.1–1.0)
Monocytes Relative: 9 %
Neutro Abs: 3.5 10*3/uL (ref 1.7–7.7)
Neutrophils Relative %: 67 %
Platelets: 276 10*3/uL (ref 150–400)
RBC: 3.91 MIL/uL (ref 3.87–5.11)
RDW: 14.9 % (ref 11.5–15.5)
WBC: 5.2 10*3/uL (ref 4.0–10.5)
nRBC: 0 % (ref 0.0–0.2)

## 2019-02-04 ENCOUNTER — Ambulatory Visit: Payer: BLUE CROSS/BLUE SHIELD

## 2019-02-04 ENCOUNTER — Inpatient Hospital Stay (HOSPITAL_BASED_OUTPATIENT_CLINIC_OR_DEPARTMENT_OTHER): Payer: BLUE CROSS/BLUE SHIELD | Admitting: Hematology and Oncology

## 2019-02-04 ENCOUNTER — Other Ambulatory Visit: Payer: Self-pay

## 2019-02-04 ENCOUNTER — Encounter: Payer: Self-pay | Admitting: Hematology and Oncology

## 2019-02-04 VITALS — BP 126/77 | HR 73 | Temp 97.2°F | Resp 18 | Ht 60.0 in | Wt 235.7 lb

## 2019-02-04 VITALS — BP 110/80 | HR 69 | Resp 18

## 2019-02-04 DIAGNOSIS — D509 Iron deficiency anemia, unspecified: Secondary | ICD-10-CM

## 2019-02-04 DIAGNOSIS — E538 Deficiency of other specified B group vitamins: Secondary | ICD-10-CM | POA: Diagnosis not present

## 2019-02-04 LAB — URINALYSIS, COMPLETE (UACMP) WITH MICROSCOPIC
Glucose, UA: NEGATIVE mg/dL
Hgb urine dipstick: NEGATIVE
Ketones, ur: NEGATIVE mg/dL
Leukocytes,Ua: NEGATIVE
Nitrite: NEGATIVE
Protein, ur: NEGATIVE mg/dL
RBC / HPF: NONE SEEN RBC/hpf (ref 0–5)
Specific Gravity, Urine: <1.005 — ABNORMAL LOW (ref 1.005–1.030)
pH: 6 (ref 5.0–8.0)

## 2019-02-04 MED ORDER — SODIUM CHLORIDE 0.9 % IV SOLN
510.0000 mg | Freq: Once | INTRAVENOUS | Status: AC
  Administered 2019-02-04: 510 mg via INTRAVENOUS
  Filled 2019-02-04: qty 17

## 2019-02-04 MED ORDER — SODIUM CHLORIDE 0.9 % IV SOLN
Freq: Once | INTRAVENOUS | Status: AC
  Administered 2019-02-04: 15:00:00 via INTRAVENOUS
  Filled 2019-02-04: qty 250

## 2019-02-04 MED ORDER — CYANOCOBALAMIN 1000 MCG/ML IJ SOLN: 1000.0000 ug | INTRAMUSCULAR | Status: DC

## 2019-02-04 NOTE — Progress Notes (Addendum)
[facility]  [address], [address] [place], [place] [address] Phone: [phone]  Fax: [phone]   Clinic day:  02/04/2019   Referring physician:  Gaetano Net, NP  Chief Complaint: Andrea Zhang is a 58 y.o. female with iron deficiency anemia for new patient assessment.  HPI:   Patient was initially seen by Dr. Janese Banks on 01/15/2018.  She was noted to have symptoms of lightheadedness and shortness of breath on 01/12/2018.  CBC revealed a hematocrit of 22.6, hemoglobin 6.7, MCV 81, platelets 324,000, white count 8400 with a normal differential.  Iron studies included a TIBC of 546 (high).  Iron saturation was 36%.  CMP and TSH were normal.  Work-up on 01/15/2018 revealed a hematocrit of 24, hemoglobin 7.5, MCV 78.2, platelets 299,000, white count 7400.  Ferritin was 7.  Iron saturation was low and TIBC 560.  Haptoglobin was normal.  Celiac disease panel was negative.  Myeloma panel revealed no monoclonal protein.  B12 was 210.  Reticulocyte count was 7.5%.  Folate was 7.6.  Urinalysis revealed no hematuria.  H. pylori antigen was negative.  Regarding her diet, she notes that she eats chicken and burgers.  She is mindful of an iron rich diet.  She eats meat 7 times a week and green leafy vegetables 3-4 times a week.  She has taken oral iron one a day for the past year.  She denies any pica.    She has received B12: 01/15/2018, 01/22/2018,  02/05/2018, and 06/11/2018.   B12 was 241 on 09/2017, 210 on 01/15/2018, 1727 on 06/11/2018, and 1467 on 09/10/2018.  Folate was 7.6 on 01/15/2018.  TSH was 0.918 on 01/11/2019.  She has received Feraheme: 510 mg on 01/15/2018, 01/22/2018, 03/19/2018, 03/26/2018, 09/13/2018, 09/27/2018, 10/26/2018, and 11/03/2019   Ferritin has been followed: 7 on 01/15/2018, 12 on 03/12/2018, 165 on 04/19/2018, 39 on 06/11/2018, 15 on 09/10/2018, 60 on 10/24/2018, 96 on 12/10/2018, and 22 on 01/31/2019.  She has been followed by Dr Vira Agar in  the [facility].  Upper endoscopy on 06/06/2016 revealed a Schatzki's ring which was dilated.  There was evidence of gastritis.  Duodenum was normal.  She has had 2-3 EGDs in the past for strictures requiring dilatation.  Colonoscopy in 2011 was normal.  EGD and colonoscopy in 03/2018 at [facility] was "normal" (no report available).  She states that she had a capsule study in 11/2018 which showed "a little inflammation" (no report available).  Symptomatically, she has no energy.  She feels like her iron level is going down.  She has shortness of breath with exertion and dizziness when walking up stairs.  She denies any melena, hematochezia, hematuria or vaginal bleeding.  She underwent menopause 6 years ago.   Past Medical History:  Diagnosis Date  . Anemia    iron deficiency and b12 deficiency  . Anxiety   . Arthritis   . Asthma   . Diabetes mellitus without complication ([facility])   . Fasciitis    left foot  . GERD (gastroesophageal reflux disease)   . History of kidney stones   . Hypercholesterolemia   . Hypertension   . Migraines    MIGRAINES HAVE IMPROVED SINCE RECEIVING IRON    Past Surgical History:  Procedure Laterality Date  . CHOLECYSTECTOMY  2004  . COLONOSCOPY WITH ESOPHAGOGASTRODUODENOSCOPY (EGD)  02/2018  . ESOPHAGOGASTRODUODENOSCOPY (EGD) WITH PROPOFOL N/A 06/06/2016   Procedure: ESOPHAGOGASTRODUODENOSCOPY (EGD) WITH PROPOFOL;  Surgeon: Manya Silvas, MD;  Location: [place];  Service: Endoscopy;  Laterality: N/A;  . EXTRACORPOREAL SHOCK WAVE LITHOTRIPSY  2010  . JOINT REPLACEMENT  2013   LT TKR  . SAVORY DILATION N/A 06/06/2016   Procedure: SAVORY DILATION;  Surgeon: Manya Silvas, MD;  Location: [facility] ENDOSCOPY;  Service: Endoscopy;  Laterality: N/A;  . SAVORY DILATION  02/2018  . SHOULDER ARTHROSCOPY WITH OPEN ROTATOR CUFF REPAIR Right 05/24/2018   Procedure: right shoulder arthroscopy, extensive arthroscopic debridement, decompression, open rotator  cuff repair, biceps tenodesis;  Surgeon: Corky Mull, MD;  Location: [facility];  Service: Orthopedics;  Laterality: Right;    Family History  Problem Relation Age of Onset  . COPD Mother   . Heart disease Mother   . Asthma Mother   . Schizophrenia Mother   . Hemophilia Father   . Prostate cancer Father   . Heart disease Father   . Epilepsy Sister   . Stroke Sister   . Alcohol abuse Sister   . Hypertension Sister   . Epilepsy Brother   . Lupus Son   . Gallbladder disease Son   . Breast cancer Neg Hx     Social History:  reports that she has never smoked. She has never used smokeless tobacco. She reports that she does not drink alcohol or use drugs.   She lives in [place].  The patient is alone today.  Allergies:  Allergies  Allergen Reactions  . Iodinated Diagnostic Agents Other (See Comments)    Becomes 'unresponsive' to ORAL and IV DYE BETADINE ON THE SKIN IS OKAY unresponsive Patient could hear things but not responsive Becomes 'unresponsive' to ORAL and IV DYE BETADINE ON THE SKIN IS OKAY   . Diclofenac Hives    HORRIBLE RASH with both PATCH OR CREAM  . Cephalexin Rash  . Maxalt [Rizatriptan Benzoate] Other (See Comments)    'Heart Races'  . Orphenadrine Citrate Other (See Comments)    Patient unsure of this allergy.  Marland Kitchen Zithromax [Azithromycin] Other (See Comments)    Severe Abdominal Cramps  . Zomig [Zolmitriptan] Other (See Comments)    'Heart Races'    Current Medications: Current Outpatient Medications  Medication Sig Dispense Refill  . aspirin EC 81 MG tablet Take 81 mg by mouth daily.    Marland Kitchen atenolol (TENORMIN) 50 MG tablet Take 50 mg by mouth daily.    . cetirizine (ZYRTEC) 10 MG tablet Take 10 mg by mouth as needed for allergies.    . Cholecalciferol 2000 units CAPS Take 2,000 Units by mouth daily.    . cyclobenzaprine (FLEXERIL) 5 MG tablet Take 5 mg by mouth 3 (three) times daily as needed for muscle spasms.    Marland Kitchen etodolac (LODINE) 400 MG tablet  Take 400 mg by mouth 2 (two) times daily.    . ferrous gluconate (FERGON) 324 MG tablet Take 324 mg by mouth daily with breakfast.    . Gabapentin Enacarbil 600 MG TBCR Take 1 tablet by mouth every evening.    . mirtazapine (REMERON) 15 MG tablet Take 15 mg by mouth at bedtime.     . naratriptan (AMERGE) 2.5 MG tablet Take 2.5 mg by mouth as needed for migraine. Take one (1) tablet at onset of headache; if returns or does not resolve, may repeat after 4 hours; do not exceed five (5) mg in 24 hours.    Marland Kitchen omeprazole (PRILOSEC) 40 MG capsule Take 40 mg by mouth 2 (two) times daily.    . pioglitazone-metformin (ACTOPLUS MET) 15-850 MG tablet Take  1 tablet by mouth 2 (two) times daily with a meal.    . simvastatin (ZOCOR) 10 MG tablet Take 10 mg by mouth daily.    . vitamin B-12 (CYANOCOBALAMIN) 1000 MCG tablet Take 1,000 mcg by mouth daily.    Marland Kitchen albuterol (PROVENTIL HFA;VENTOLIN HFA) 108 (90 Base) MCG/ACT inhaler Inhale 2 puffs into the lungs every 6 (six) hours as needed for wheezing or shortness of breath.    . hydrochlorothiazide (HYDRODIURIL) 12.5 MG tablet Take 12.5 mg by mouth as needed (fluid).    . promethazine (PHENERGAN) 25 MG tablet Take 25 mg by mouth every 6 (six) hours as needed for nausea or vomiting.    . traMADol (ULTRAM) 50 MG tablet Take 50 mg by mouth every 6 (six) hours as needed.     No current facility-administered medications for this visit.     Review of Systems:  GENERAL:  Feels "ok, but real cold" (chronic).  No energy.  No fevers, sweats.  Weight gain. PERFORMANCE STATUS (ECOG):  1 HEENT:  Allergies.  No visual changes, runny nose, sore throat, mouth sores or tenderness. Lungs: Shortness of breath with exertion.  No cough.  No hemoptysis. Cardiac:  No chest pain, palpitations, orthopnea, or PND. GI:  No nausea, vomiting, diarrhea, constipation, melena or hematochezia.  No pica. GU:  No urgency, frequency, dysuria, or hematuria. Musculoskeletal:  No back pain.  Achy  joints.  No muscle tenderness. Extremities:  No pain or swelling. Skin:  No rashes or skin changes. Neuro:  Dizzy with walking up steps.  Restless legs.  No headache, numbness or weakness, balance or coordination issues. Endocrine:  Diabetes.  No thyroid issues.  Menopausal hot flashes. Psych:  No mood changes, depression or anxiety. Pain:  No focal pain. Review of systems:  All other systems reviewed and found to be negative.  Physical Exam: Blood pressure 126/77, pulse 73, temperature (!) 97.2 F (36.2 C), temperature source Tympanic, resp. rate 18, height 5' (1.524 m), weight 235 lb 10.8 oz (106.9 kg), SpO2 100 %. GENERAL:  Well developed, well nourished, heavyset woman sitting comfortably in the exam room in no acute distress. MENTAL STATUS:  Alert and oriented to person, place and time. HEAD:  Long light brown hair.  Normocephalic, atraumatic, face symmetric, no Cushingoid features. EYES:  Glasses.  Blue eyes.  Pupils equal round and reactive to light and accomodation.  No conjunctivitis or scleral icterus. ENT:  Oropharynx clear without lesion.  Tongue normal. Mucous membranes moist.  RESPIRATORY:  Clear to auscultation without rales, wheezes or rhonchi. CARDIOVASCULAR:  Regular rate and rhythm without murmur, rub or gallop. ABDOMEN:  Soft, non-tender, with active bowel sounds, and no appreciable hepatosplenomegaly.  No masses. SKIN:  Pale.  No rashes, ulcers or lesions. EXTREMITIES: No edema, no skin discoloration or tenderness.  No palpable cords. LYMPH NODES: No palpable cervical, supraclavicular, axillary or inguinal adenopathy  NEUROLOGICAL: Unremarkable. PSYCH:  Appropriate.   No visits with results within 3 Day(s) from this visit.  Latest known visit with results is:  Appointment on 01/31/2019  Component Date Value Ref Range Status  . Ferritin 01/31/2019 22  11 - 307 ng/mL Final   Performed at [facility], [address] [place], [place] [address]  . WBC  01/31/2019 5.2  4.0 - 10.5 K/uL Final  . RBC 01/31/2019 3.91  3.87 - 5.11 MIL/uL Final  . Hemoglobin 01/31/2019 11.5* 12.0 - 15.0 g/dL Final  . HCT 01/31/2019 37.2  36.0 - 46.0 % Final  .  MCV 01/31/2019 95.1  80.0 - 100.0 fL Final  . MCH 01/31/2019 29.4  26.0 - 34.0 pg Final  . MCHC 01/31/2019 30.9  30.0 - 36.0 g/dL Final  . RDW 01/31/2019 14.9  11.5 - 15.5 % Final  . Platelets 01/31/2019 276  150 - 400 K/uL Final  . nRBC 01/31/2019 0.0  0.0 - 0.2 % Final  . Neutrophils Relative % 01/31/2019 67  % Final  . Neutro Abs 01/31/2019 3.5  1.7 - 7.7 K/uL Final  . Lymphocytes Relative 01/31/2019 19  % Final  . Lymphs Abs 01/31/2019 1.0  0.7 - 4.0 K/uL Final  . Monocytes Relative 01/31/2019 9  % Final  . Monocytes Absolute 01/31/2019 0.5  0.1 - 1.0 K/uL Final  . Eosinophils Relative 01/31/2019 4  % Final  . Eosinophils Absolute 01/31/2019 0.2  0.0 - 0.5 K/uL Final  . Basophils Relative 01/31/2019 0  % Final  . Basophils Absolute 01/31/2019 0.0  0.0 - 0.1 K/uL Final  . Immature Granulocytes 01/31/2019 1  % Final  . Abs Immature Granulocytes 01/31/2019 0.05  0.00 - 0.07 K/uL Final   Performed at [facility], [address], [place], [place] [address]    Assessment:  Andrea Zhang is a 58 y.o. female with recurrent iron deficiency anemia (unclear etiology) and B12 deficiency.  Diet is good.  She has taken oral iron daily for a year.  Work-up on 01/15/2018 revealed a hematocrit of 24, hemoglobin 7.5, MCV 78.2, platelets 299,000, white count 7400.  Ferritin was 7.  TIBC was 560 (high).  Normal studies included: haptoglobin, celiac disease panel, myeloma panel, folate (7.6), urinalysis (no hematuria), and H pylori antigen.  B12 was 210.  Reticulocyte count was 7.5%. TSH was 0.918 on 01/11/2019.  She has received Feraheme: 510 mg on 01/15/2018, 01/22/2018, 03/19/2018, 03/26/2018, 09/13/2018, 09/27/2018, 10/26/2018, and 11/02/2018.   Ferritin has been followed: 7 on 01/15/2018, 12 on  03/12/2018, 165 on 04/19/2018, 39 on 06/11/2018, 15 on 09/10/2018, 60 on 10/24/2018, 96 on 12/10/2018, and 22 on 01/31/2019.  She has B12 deficiency.  She has received B12 injections: 01/15/2018, 01/22/2018,  02/05/2018, and 06/11/2018.   B12 has been followed: 241 on 09/12/2017, 210 on 01/15/2018, 1727 on 06/11/2018, and 1467 on 09/10/2018.  Folate was 7.6 on 01/15/2018.  Upper endoscopy on 06/06/2016 revealed a Schatzki's ring which was dilated.  There was evidence of gastritis.  Duodenum was normal.  She has had 2-3 EGDs in the past for strictures requiring dilatation.  Colonoscopy in 2011 was normal.  EGD and colonoscopy in 03/2018 at [facility] was "normal" (no report available).  She states that she had a capsule study in 11/2018 which showed "a little inflammation" (no report available).  Symptomatically, she has no energy.  She has shortness of breath with exertion and dizziness when walking up stairs.  She denies any melena, hematochezia, hematuria or vaginal bleeding.    Plan: 1.   Review labs from 01/31/2019. 2.   Review entire medical history, diagnosis and management of iron deficiency anemia and B12 deficiency. 3.   Iron deficiency anemia  Etiology of persistent iron deficiency unclear.  Patient has received multiple infusions of IV iron.  Recheck urinalysis today.  Obtain copies of last EGD, colonoscopy and capsule study.  Discuss ferritin goal of 100.    Patient has been on oral iron x 1 year.  Discuss taking oral iron with OJ or vitamin C.  Discuss IV iron if ferritin <= 30.  Feraheme today.  4.   B12 deficiency  She is on oral B12.  Folate was 7.6 on 01/15/2018.  Monitor B12 level and folate periodically. 5.   RTC in 1 month for MD assessment, labs (CBC with diff, ferritin, folate- day before) and +/- Feraheme.  I discussed the assessment and treatment plan with the patient.  The patient was provided an opportunity to ask questions and all were answered.  The patient agreed  with the plan and demonstrated an understanding of the instructions.  The patient was advised to call back or seek an in person evaluation if the symptoms worsen or if the condition fails to improve as anticipated.    Lequita Asal, MD, PhD  02/04/2019, 2:30 PM

## 2019-02-04 NOTE — Patient Instructions (Signed)

## 2019-02-04 NOTE — Progress Notes (Signed)
No new changes noted today 

## 2019-02-06 ENCOUNTER — Ambulatory Visit: Payer: BLUE CROSS/BLUE SHIELD | Admitting: Hematology and Oncology

## 2019-02-06 ENCOUNTER — Ambulatory Visit: Payer: BLUE CROSS/BLUE SHIELD

## 2019-03-01 ENCOUNTER — Other Ambulatory Visit: Payer: Self-pay

## 2019-03-01 ENCOUNTER — Inpatient Hospital Stay: Payer: BLUE CROSS/BLUE SHIELD | Attending: Hematology and Oncology

## 2019-03-01 DIAGNOSIS — D509 Iron deficiency anemia, unspecified: Secondary | ICD-10-CM

## 2019-03-01 LAB — CBC WITH DIFFERENTIAL/PLATELET
Abs Immature Granulocytes: 0.03 10*3/uL (ref 0.00–0.07)
Basophils Absolute: 0 10*3/uL (ref 0.0–0.1)
Basophils Relative: 0 %
Eosinophils Absolute: 0.2 10*3/uL (ref 0.0–0.5)
Eosinophils Relative: 3 %
HCT: 38.6 % (ref 36.0–46.0)
Hemoglobin: 12.1 g/dL (ref 12.0–15.0)
Immature Granulocytes: 0 %
Lymphocytes Relative: 17 %
Lymphs Abs: 1.2 10*3/uL (ref 0.7–4.0)
MCH: 29.4 pg (ref 26.0–34.0)
MCHC: 31.3 g/dL (ref 30.0–36.0)
MCV: 93.9 fL (ref 80.0–100.0)
Monocytes Absolute: 0.6 10*3/uL (ref 0.1–1.0)
Monocytes Relative: 9 %
Neutro Abs: 4.9 10*3/uL (ref 1.7–7.7)
Neutrophils Relative %: 71 %
Platelets: 313 10*3/uL (ref 150–400)
RBC: 4.11 MIL/uL (ref 3.87–5.11)
RDW: 14.5 % (ref 11.5–15.5)
WBC: 6.9 10*3/uL (ref 4.0–10.5)
nRBC: 0 % (ref 0.0–0.2)

## 2019-03-01 LAB — FERRITIN: Ferritin: 62 ng/mL (ref 11–307)

## 2019-03-01 LAB — FOLATE: Folate: 5.3 ng/mL — ABNORMAL LOW (ref >5.9)

## 2019-03-03 ENCOUNTER — Other Ambulatory Visit: Payer: Self-pay

## 2019-03-03 ENCOUNTER — Encounter: Payer: Self-pay | Admitting: Hematology and Oncology

## 2019-03-03 DIAGNOSIS — E538 Deficiency of other specified B group vitamins: Secondary | ICD-10-CM

## 2019-03-03 NOTE — Progress Notes (Deleted)
[facility]  [address], [address] [place], [place] [address] Phone: [phone]  Fax: [phone]   Clinic day:  03/03/2019   Referring physician:  Gaetano Net, NP  Chief Complaint: Andrea Zhang is a 58 y.o. female with iron deficiency anemia and B12 deficiency who is seen for 1 month assessment.  HPI:   The patient was last seen in the hematology clinic on 02/04/2019 for initial assessment.  She had iron deficiency anemia of unclear etiology requiring frequent IV infusions.  She has been on oral iron x 1 year.  Symptomatically, she had no energy.  She had shortness of breath with exertion and dizziness when walking up stairs.  She denied any melena, hematochezia, hematuria or vaginal bleeding.  Hemoglobin was  11.5.  MCV was 95.1.  Ferritin was 22.  As her ferritin was < 30, she received Feraheme 510 mg IV on 02/04/2019.  Additional records were obtained.  EGD on 03/15/2018 revealed a mild Schatzki ring at the GE junction that was dilated.  There was m mildly erythematous mucosa without bleeding in the gastric body and in the gastric antrum. There was a medium size hiatal hernia.  Duodenum was normal.   Gastric mucosa biopsies revealed reactive foveolar hyperplasia, stromal fibrosis and focal chronic inflammation.  There was no Helicobacter pylori.  Colonoscopy on 03/15/2018 revealed small and large mouth diverticula in the sigmoid colon, descending colon, transverse colon and ascending colon internal hemorrhoids  Labs on 03/01/2019 revealed a hematocrit of 38.6, hemoglobin 12.1, and MCV 93.9.  Ferritin was 62.  Folate was 5.3.    During the interim,   Past Medical History:  Diagnosis Date   Anemia    iron deficiency and b12 deficiency   Anxiety    Arthritis    Asthma    Diabetes mellitus without complication (Time)    Fasciitis    left foot   GERD (gastroesophageal reflux disease)    History of kidney stones    Hypercholesterolemia     Hypertension    Migraines    MIGRAINES HAVE IMPROVED SINCE RECEIVING IRON    Past Surgical History:  Procedure Laterality Date   CHOLECYSTECTOMY  2004   COLONOSCOPY WITH ESOPHAGOGASTRODUODENOSCOPY (EGD)  02/2018   ESOPHAGOGASTRODUODENOSCOPY (EGD) WITH PROPOFOL N/A 06/06/2016   Procedure: ESOPHAGOGASTRODUODENOSCOPY (EGD) WITH PROPOFOL;  Surgeon: Manya Silvas, MD;  Location: [facility];  Service: Endoscopy;  Laterality: N/A;   EXTRACORPOREAL SHOCK WAVE LITHOTRIPSY  2010   JOINT REPLACEMENT  2013   LT TKR   SAVORY DILATION N/A 06/06/2016   Procedure: SAVORY DILATION;  Surgeon: Manya Silvas, MD;  Location: [facility];  Service: Endoscopy;  Laterality: N/A;   SAVORY DILATION  02/2018   SHOULDER ARTHROSCOPY WITH OPEN ROTATOR CUFF REPAIR Right 05/24/2018   Procedure: right shoulder arthroscopy, extensive arthroscopic debridement, decompression, open rotator cuff repair, biceps tenodesis;  Surgeon: Corky Mull, MD;  Location: [facility];  Service: Orthopedics;  Laterality: Right;    Family History  Problem Relation Age of Onset   COPD Mother    Heart disease Mother    Asthma Mother    Schizophrenia Mother    Hemophilia Father    Prostate cancer Father    Heart disease Father    Epilepsy Sister    Stroke Sister    Alcohol abuse Sister    Hypertension Sister    Epilepsy Brother    Lupus Daughter    Gallbladder disease Daughter    Breast cancer  Neg Hx     Social History:  reports that she has never smoked. She has never used smokeless tobacco. She reports that she does not drink alcohol or use drugs.   She lives in [place].  The patient is alone today.  Allergies:  Allergies  Allergen Reactions   Iodinated Diagnostic Agents Other (See Comments)    Becomes 'unresponsive' to ORAL and IV DYE BETADINE ON THE SKIN IS OKAY unresponsive Patient could hear things but not responsive Becomes 'unresponsive' to ORAL and IV DYE BETADINE ON THE SKIN  IS OKAY    Diclofenac Hives    HORRIBLE RASH with both PATCH OR CREAM   Cephalexin Rash   Maxalt [Rizatriptan Benzoate] Other (See Comments)    'Heart Races'   Orphenadrine Citrate Other (See Comments)    Patient unsure of this allergy.   Zithromax [Azithromycin] Other (See Comments)    Severe Abdominal Cramps   Zomig [Zolmitriptan] Other (See Comments)    'Heart Races'    Current Medications: Current Outpatient Medications  Medication Sig Dispense Refill   albuterol (PROVENTIL HFA;VENTOLIN HFA) 108 (90 Base) MCG/ACT inhaler Inhale 2 puffs into the lungs every 6 (six) hours as needed for wheezing or shortness of breath.     aspirin EC 81 MG tablet Take 81 mg by mouth daily.     atenolol (TENORMIN) 50 MG tablet Take 50 mg by mouth daily.     cetirizine (ZYRTEC) 10 MG tablet Take 10 mg by mouth as needed for allergies.     Cholecalciferol 2000 units CAPS Take 2,000 Units by mouth daily.     cyclobenzaprine (FLEXERIL) 5 MG tablet Take 5 mg by mouth 3 (three) times daily as needed for muscle spasms.     etodolac (LODINE) 400 MG tablet Take 400 mg by mouth 2 (two) times daily.     ferrous gluconate (FERGON) 324 MG tablet Take 324 mg by mouth daily with breakfast.     Gabapentin Enacarbil 600 MG TBCR Take 1 tablet by mouth every evening.     hydrochlorothiazide (HYDRODIURIL) 12.5 MG tablet Take 12.5 mg by mouth as needed (fluid).     mirtazapine (REMERON) 15 MG tablet Take 15 mg by mouth at bedtime.      naratriptan (AMERGE) 2.5 MG tablet Take 2.5 mg by mouth as needed for migraine. Take one (1) tablet at onset of headache; if returns or does not resolve, may repeat after 4 hours; do not exceed five (5) mg in 24 hours.     omeprazole (PRILOSEC) 40 MG capsule Take 40 mg by mouth 2 (two) times daily.     pioglitazone-metformin (ACTOPLUS MET) 15-850 MG tablet Take 1 tablet by mouth 2 (two) times daily with a meal.     promethazine (PHENERGAN) 25 MG tablet Take 25 mg by  mouth every 6 (six) hours as needed for nausea or vomiting.     simvastatin (ZOCOR) 10 MG tablet Take 10 mg by mouth daily.     traMADol (ULTRAM) 50 MG tablet Take 50 mg by mouth every 6 (six) hours as needed.     vitamin B-12 (CYANOCOBALAMIN) 1000 MCG tablet Take 1,000 mcg by mouth daily.     No current facility-administered medications for this visit.     Review of Systems:  GENERAL:  Feels "ok, but real cold" (chronic).  No energy.  No fevers, sweats.  Weight gain. PERFORMANCE STATUS (ECOG):  1 HEENT:  Allergies.  No visual changes, runny nose, sore throat, mouth sores  or tenderness. Lungs: Shortness of breath with exertion.  No cough.  No hemoptysis. Cardiac:  No chest pain, palpitations, orthopnea, or PND. GI:  No nausea, vomiting, diarrhea, constipation, melena or hematochezia.  No pica. GU:  No urgency, frequency, dysuria, or hematuria. Musculoskeletal:  No back pain.  Achy joints.  No muscle tenderness. Extremities:  No pain or swelling. Skin:  No rashes or skin changes. Neuro:  Dizzy with walking up steps.  Restless legs.  No headache, numbness or weakness, balance or coordination issues. Endocrine:  Diabetes.  No thyroid issues.  Menopausal hot flashes. Psych:  No mood changes, depression or anxiety. Pain:  No focal pain. Review of systems:  All other systems reviewed and found to be negative.  Physical Exam: There were no vitals taken for this visit. GENERAL:  Well developed, well nourished, heavyset woman sitting comfortably in the exam room in no acute distress. MENTAL STATUS:  Alert and oriented to person, place and time. HEAD:  Long light brown hair.  Normocephalic, atraumatic, face symmetric, no Cushingoid features. EYES:  Glasses.  Blue eyes.  Pupils equal round and reactive to light and accomodation.  No conjunctivitis or scleral icterus. ENT:  Oropharynx clear without lesion.  Tongue normal. Mucous membranes moist.  RESPIRATORY:  Clear to auscultation without  rales, wheezes or rhonchi. CARDIOVASCULAR:  Regular rate and rhythm without murmur, rub or gallop. ABDOMEN:  Soft, non-tender, with active bowel sounds, and no appreciable hepatosplenomegaly.  No masses. SKIN:  Pale.  No rashes, ulcers or lesions. EXTREMITIES: No edema, no skin discoloration or tenderness.  No palpable cords. LYMPH NODES: No palpable cervical, supraclavicular, axillary or inguinal adenopathy  NEUROLOGICAL: Unremarkable. PSYCH:  Appropriate.   No visits with results within 3 Day(s) from this visit.  Latest known visit with results is:  Appointment on 03/01/2019  Component Date Value Ref Range Status   Folate 03/01/2019 5.3* >5.9 ng/mL Final   Performed at [facility], [address] [place], [place] [address]   Ferritin 03/01/2019 62  11 - 307 ng/mL Final   Performed at [facility], [place], [place] [address]   WBC 03/01/2019 6.9  4.0 - 10.5 K/uL Final   RBC 03/01/2019 4.11  3.87 - 5.11 MIL/uL Final   Hemoglobin 03/01/2019 12.1  12.0 - 15.0 g/dL Final   HCT 03/01/2019 38.6  36.0 - 46.0 % Final   MCV 03/01/2019 93.9  80.0 - 100.0 fL Final   MCH 03/01/2019 29.4  26.0 - 34.0 pg Final   MCHC 03/01/2019 31.3  30.0 - 36.0 g/dL Final   RDW 03/01/2019 14.5  11.5 - 15.5 % Final   Platelets 03/01/2019 313  150 - 400 K/uL Final   nRBC 03/01/2019 0.0  0.0 - 0.2 % Final   Neutrophils Relative % 03/01/2019 71  % Final   Neutro Abs 03/01/2019 4.9  1.7 - 7.7 K/uL Final   Lymphocytes Relative 03/01/2019 17  % Final   Lymphs Abs 03/01/2019 1.2  0.7 - 4.0 K/uL Final   Monocytes Relative 03/01/2019 9  % Final   Monocytes Absolute 03/01/2019 0.6  0.1 - 1.0 K/uL Final   Eosinophils Relative 03/01/2019 3  % Final   Eosinophils Absolute 03/01/2019 0.2  0.0 - 0.5 K/uL Final   Basophils Relative 03/01/2019 0  % Final   Basophils Absolute 03/01/2019 0.0  0.0 - 0.1 K/uL Final   Immature Granulocytes 03/01/2019 0  % Final   Abs  Immature Granulocytes 03/01/2019 0.03  0.00 - 0.07 K/uL Final   Performed at [facility], [address] [place], [place] [address]    Assessment:  Andrea Zhang is a 58 y.o. female with recurrent iron deficiency anemia (unclear etiology) and B12 deficiency.  Diet is good.  She has taken oral iron daily for a year.  Work-up on 01/15/2018 revealed a hematocrit of 24, hemoglobin 7.5, MCV 78.2, platelets 299,000, white count 7400.  Ferritin was 7.  TIBC was 560 (high).  Normal studies included: haptoglobin, celiac disease panel, myeloma panel, folate (7.6), urinalysis (no hematuria), and H pylori antigen.  B12 was 210.  Reticulocyte count was 7.5%. TSH was 0.918 on 01/11/2019.  She has received Feraheme: 510 mg on 01/15/2018, 01/22/2018, 03/19/2018, 03/26/2018, 09/13/2018, 09/27/2018, 10/26/2018, 11/02/2018, and 02/04/2019.   Ferritin has been followed: 7 on 01/15/2018, 12 on 03/12/2018, 165 on 04/19/2018, 39 on 06/11/2018, 15 on 09/10/2018, 60 on 10/24/2018, 96 on 12/10/2018, 22 on 01/31/2019, and 62 on 03/01/2019.  She has B12 deficiency.  She has received B12 injections: 01/15/2018, 01/22/2018,  02/05/2018, and 06/11/2018.   B12 has been followed: 241 on 09/12/2017, 210 on 01/15/2018, 1727 on 06/11/2018, and 1467 on 09/10/2018.   She has folate deficiency.  Folate was 5.3 on 03/01/2019.  EGD on 06/06/2016 revealed a Schatzki's ring which was dilated.  There was evidence of gastritis.  Duodenum was normal.  EGD on 05/59/2019 revealed a mild Schatzki ring at the GE junction that was dilated.  There was mildly erythematous mucosa without bleeding in the gastric body and in the gastric antrum. There was a medium size hiatal hernia.  Duodenum was normal.   Gastric mucosa biopsies revealed reactive foveolar hyperplasia, stromal fibrosis and focal chronic inflammation.  There was no Helicobacter pylori.  Colonoscopy on 03/15/2018 revealed small and large mouth diverticula in the sigmoid  colon, descending colon, transverse colon and ascending colon.  There were internal hemorrhoids.  Capsule study in 11/2018 showed "a little inflammation" (no report available).  Symptomatically,   Plan: 1.   Review labs from 03/01/2019.  2.   Iron deficiency anemia  Etiology of persistent iron deficiency unclear.  Patient has received multiple infusions of IV iron.  Recheck urinalysis today.  Obtain copies of last EGD, colonoscopy and capsule study.  Discuss ferritin goal of 100.    Patient has been on oral iron x 1 year.  Discuss taking oral iron with OJ or vitamin C.  Discuss IV iron if ferritin <= 30.  Feraheme today. 4.   B12 deficiency  She is on oral B12.  Folate was 7.6 on 01/15/2018.  Monitor B12 level and folate periodically. 5.   Folate deficiency   6.   RTC in 3 months for MD assessment, labs (CBC with diff, ferritin, B12, anti-parietal antibodies, intrinsic factor antibodies- day before) and +/- Feraheme.  I discussed the assessment and treatment plan with the patient.  The patient was provided an opportunity to ask questions and all were answered.  The patient agreed with the plan and demonstrated an understanding of the instructions.  The patient was advised to call back or seek an in person evaluation if the symptoms worsen or if the condition fails to improve as anticipated.    Lequita Asal, MD, PhD  03/03/2019, 5:37 PM

## 2019-03-04 ENCOUNTER — Telehealth: Payer: Self-pay

## 2019-03-04 ENCOUNTER — Ambulatory Visit: Payer: BLUE CROSS/BLUE SHIELD

## 2019-03-04 ENCOUNTER — Ambulatory Visit: Payer: BLUE CROSS/BLUE SHIELD | Admitting: Hematology and Oncology

## 2019-03-04 DIAGNOSIS — E538 Deficiency of other specified B group vitamins: Secondary | ICD-10-CM

## 2019-03-04 DIAGNOSIS — D509 Iron deficiency anemia, unspecified: Secondary | ICD-10-CM

## 2019-03-04 NOTE — Telephone Encounter (Signed)
-----   Message from Rosey Bath, MD sent at 03/03/2019  5:23 AM EDT ----- Regarding: Please call patient  Folic acid level is low.  Begin folic acid (folate) 1 mg po q day.  Check level in 1 month.  M ----- Message ----- From: Leory Plowman, [facility] [place] Sent: 03/01/2019   1:50 PM EDT To: Rosey Bath, MD

## 2019-03-04 NOTE — Telephone Encounter (Signed)
Informed patient of folic acid results and initiation of Folic Acid 1 MG Daily per Dr. Merlene Pulling. Advised Dr. Merlene Pulling would like to recheck levels in 1 month. Patient verbalizes understanding and wished to push put her MD visit 1 month as well.

## 2019-03-08 ENCOUNTER — Other Ambulatory Visit: Payer: BLUE CROSS/BLUE SHIELD

## 2019-03-08 ENCOUNTER — Encounter: Payer: Self-pay | Admitting: Unknown Physician Specialty

## 2019-03-11 ENCOUNTER — Ambulatory Visit: Payer: BLUE CROSS/BLUE SHIELD | Admitting: Hematology and Oncology

## 2019-03-11 ENCOUNTER — Ambulatory Visit: Payer: BLUE CROSS/BLUE SHIELD

## 2019-03-11 ENCOUNTER — Other Ambulatory Visit: Payer: BLUE CROSS/BLUE SHIELD

## 2019-03-11 ENCOUNTER — Ambulatory Visit: Payer: BLUE CROSS/BLUE SHIELD | Admitting: Oncology

## 2019-03-20 ENCOUNTER — Telehealth: Payer: Self-pay | Admitting: Hematology and Oncology

## 2019-03-20 NOTE — Telephone Encounter (Signed)
Re:  GI follow-up  Spoke with Dr Mechele Collin today.  Prior capsule study revealed erosive gastritis.  She needs to be on a PPI and continue her IV iron.   Rosey Bath, MD

## 2019-04-02 ENCOUNTER — Telehealth: Payer: Self-pay | Admitting: Hematology and Oncology

## 2019-04-02 ENCOUNTER — Inpatient Hospital Stay: Payer: BLUE CROSS/BLUE SHIELD | Attending: Hematology and Oncology

## 2019-04-02 ENCOUNTER — Other Ambulatory Visit: Payer: Self-pay

## 2019-04-02 DIAGNOSIS — E538 Deficiency of other specified B group vitamins: Secondary | ICD-10-CM

## 2019-04-02 DIAGNOSIS — D509 Iron deficiency anemia, unspecified: Secondary | ICD-10-CM | POA: Diagnosis not present

## 2019-04-02 LAB — FOLATE: Folate: 25 ng/mL (ref >5.9)

## 2019-04-02 LAB — CBC
HCT: 37.9 % (ref 36.0–46.0)
Hemoglobin: 11.8 g/dL — ABNORMAL LOW (ref 12.0–15.0)
MCH: 28.9 pg (ref 26.0–34.0)
MCHC: 31.1 g/dL (ref 30.0–36.0)
MCV: 92.7 fL (ref 80.0–100.0)
Platelets: 283 10*3/uL (ref 150–400)
RBC: 4.09 MIL/uL (ref 3.87–5.11)
RDW: 14.3 % (ref 11.5–15.5)
WBC: 6.3 10*3/uL (ref 4.0–10.5)
nRBC: 0 % (ref 0.0–0.2)

## 2019-04-02 LAB — FERRITIN: Ferritin: 21 ng/mL (ref 11–307)

## 2019-04-02 NOTE — Progress Notes (Signed)
[facility]  [address] [place], [place] [address] Phone: [phone]  Fax: [phone]   Telemedicine Office Visit:  04/03/2019  Referring physician: Sallee Zhang, *  I connected with Andrea Zhang on 04/03/2019 at 1:12 PM by videoconferencing and verified that I was speaking with the correct person using 2 identifiers.  The patient was at home.  I discussed the limitations, risk, security and privacy concerns of performing an evaluation and management service by videoconferencing and the availability of in person appointments.  I also discussed with the patient that there may be a patient responsible charge related to this service.  The patient expressed understanding and agreed to proceed.   Chief Complaint: Andrea Zhang is a 58 y.o. female with iron deficiency anemia seen for 2 month assessment.  HPI: The patient was last seen in the hematology clinic on 02/04/2019. At that time, she had no energy. She had shortness of breath with exertion and dizziness when walking up stairs. She denied any melena, hematochezia, hematuria or vaginal bleeding.  Ferritin was 22.  She received Feraheme x 1.  Patient was advised of low folate on 03/04/2019 and was started on folic acid 73m daily.   Correspondence with Dr. EVira Zhang 03/20/2019 confirmed that prior capsule study revealed erosive gastritis.  It was recommended that she receive IV iron and continue with PPI.   CBC followed: 03/01/2019: WBC 6,900, hemoglobin 12.1, hematocrit 38.6, platelets 313,000. Ferritin 62. Folate 5.3.   04/02/2019: WBC 3,600, hemoglobin 11.8, hematocrit 37.9, platelets 283,000. Ferritin 21. Folate 25.0.   During the interim, she is doing better. She is no longer cold all the time. Dizziness and shortness of breath are significantly less frequent. She has myalgias chronically from arthritis. She has restless legs for which she takes gabapentin, and which are improving.     Past Medical History:  Diagnosis Date  . Anemia    iron deficiency and b12 deficiency  . Anxiety   . Arthritis   . Asthma   . [place]   . Fasciitis    left foot  . GERD (gastroesophageal reflux disease)   . History of kidney stones   . Hypercholesterolemia   . Hypertension   . Migraines    MIGRAINES HAVE IMPROVED SINCE RECEIVING IRON    Past Surgical History:  Procedure Laterality Date  . CHOLECYSTECTOMY  2004  . COLONOSCOPY WITH ESOPHAGOGASTRODUODENOSCOPY (EGD)  02/2018  . ESOPHAGOGASTRODUODENOSCOPY (EGD) WITH PROPOFOL N/A 06/06/2016   Procedure: ESOPHAGOGASTRODUODENOSCOPY (EGD) WITH PROPOFOL;  Surgeon: RManya Silvas MD;  [facility];  Service: Endoscopy;  Laterality: N/A;  . EXTRACORPOREAL SHOCK WAVE LITHOTRIPSY  2010  . JOINT REPLACEMENT  2013   LT TKR  . SAVORY DILATION N/A 06/06/2016   Procedure: SAVORY DILATION;  Surgeon: RManya Silvas MD;  [facility];  Service: Endoscopy;  Laterality: N/A;  . SAVORY DILATION  02/2018  . SHOULDER ARTHROSCOPY WITH OPEN ROTATOR CUFF REPAIR Right 05/24/2018   Procedure: right shoulder arthroscopy, extensive arthroscopic debridement, decompression, open rotator cuff repair, biceps tenodesis;  Surgeon: PCorky Mull MD;  Location: [facility];  Service: Orthopedics;  Laterality: Right;    Family History  Problem Relation Age of Onset  . COPD Mother   . Heart disease Mother   . Asthma Mother   . Schizophrenia Mother   . Hemophilia Father   . Prostate cancer Father   . Heart disease Father   . Epilepsy Sister   .  Stroke Sister   . Alcohol abuse Sister   . Hypertension Sister   . Epilepsy Brother   . Lupus Daughter   . Gallbladder disease Daughter   . Breast cancer Neg Hx     Social History:  reports that she has never smoked. She has never used smokeless tobacco. She reports that she does not drink alcohol or use drugs.  She lives in [place]. The patient is  alone today.  Participants in the patient's visit and their role in the encounter included the patient and Andrea Zhang, CMA, today.  The intake visit was provided by Andrea Zhang, CMA.  Allergies:  Allergies  Allergen Reactions  . Iodinated Diagnostic Agents Other (See Comments)    Becomes 'unresponsive' to ORAL and IV DYE BETADINE ON THE SKIN IS OKAY unresponsive Patient could hear things but not responsive Becomes 'unresponsive' to ORAL and IV DYE BETADINE ON THE SKIN IS OKAY   . Diclofenac Hives    HORRIBLE RASH with both PATCH OR CREAM  . Cephalexin Rash  . Maxalt [Rizatriptan Benzoate] Other (See Comments)    'Heart Races'  . Orphenadrine Citrate Other (See Comments)    Patient unsure of this allergy.  Marland Kitchen Zithromax [Azithromycin] Other (See Comments)    Severe Abdominal Cramps  . Zomig [Zolmitriptan] Other (See Comments)    'Heart Races'    Current Medications: Current Outpatient Medications  Medication Sig Dispense Refill  . aspirin EC 81 MG tablet Take 81 mg by mouth daily.    Marland Kitchen atenolol (TENORMIN) 50 MG tablet Take 50 mg by mouth daily.    . Cholecalciferol 2000 units CAPS Take 2,000 Units by mouth daily.    . cyclobenzaprine (FLEXERIL) 5 MG tablet Take 5 mg by mouth 3 (three) times daily as needed for muscle spasms.    Marland Kitchen etodolac (LODINE) 400 MG tablet Take 400 mg by mouth 2 (two) times daily.    . ferrous gluconate (FERGON) 324 MG tablet Take 324 mg by mouth daily with breakfast.    . Gabapentin Enacarbil 600 MG TBCR Take 1 tablet by mouth every evening.    . hydrochlorothiazide (HYDRODIURIL) 12.5 MG tablet Take 12.5 mg by mouth as needed (fluid).    . mirtazapine (REMERON) 15 MG tablet Take 15 mg by mouth at bedtime.     . naratriptan (AMERGE) 2.5 MG tablet Take 2.5 mg by mouth as needed for migraine. Take one (1) tablet at onset of headache; if returns or does not resolve, may repeat after 4 hours; do not exceed five (5) mg in 24 hours.    Marland Kitchen omeprazole  (PRILOSEC) 40 MG capsule Take 40 mg by mouth 2 (two) times daily.    . pioglitazone-metformin (ACTOPLUS MET) 15-850 MG tablet Take 1 tablet by mouth 2 (two) times daily with a meal.    . simvastatin (ZOCOR) 10 MG tablet Take 10 mg by mouth daily.    . vitamin B-12 (CYANOCOBALAMIN) 1000 MCG tablet Take 1,000 mcg by mouth daily.    Marland Kitchen albuterol (PROVENTIL HFA;VENTOLIN HFA) 108 (90 Base) MCG/ACT inhaler Inhale 2 puffs into the lungs every 6 (six) hours as needed for wheezing or shortness of breath.    . cetirizine (ZYRTEC) 10 MG tablet Take 10 mg by mouth as needed for allergies.    . promethazine (PHENERGAN) 25 MG tablet Take 25 mg by mouth every 6 (six) hours as needed for nausea or vomiting.    . traMADol (ULTRAM) 50 MG tablet Take 50 mg by  mouth every 6 (six) hours as needed.     No current facility-administered medications for this visit.     Review of Systems  Constitutional: Positive for malaise/fatigue. Negative for chills, diaphoresis, fever and weight loss.       Doing better.  HENT: Negative.  Negative for congestion, hearing loss, nosebleeds, sinus pain and sore throat.   Eyes: Negative.  Negative for blurred vision.  Respiratory: Positive for shortness of breath (occasional, improving). Negative for cough, sputum production and wheezing.   Cardiovascular: Negative for chest pain, palpitations, claudication, leg swelling and PND.  Gastrointestinal: Negative.  Negative for abdominal pain, blood in stool, constipation, diarrhea, heartburn, nausea and vomiting.       No pica.  Genitourinary: Negative for dysuria, frequency and urgency.  Musculoskeletal: Positive for joint pain (arthritis). Negative for back pain and myalgias.  Skin: Negative.  Negative for rash.  Neurological: Positive for dizziness (occasional, improving) and tremors (restless legs). Negative for tingling, sensory change, weakness and headaches.  Endo/Heme/Allergies: Positive for environmental allergies. Does not  bruise/bleed easily.       Feels cold less frequently.  Psychiatric/Behavioral: Negative.  Negative for depression and memory loss. The patient is not nervous/anxious and does not have insomnia.   All other systems reviewed and are negative.   Performance status (ECOG):  1  Physical Exam  Constitutional: She is oriented to person, place, and time. She appears well-developed and well-nourished. No distress.  HENT:  Long light brown hair.  Eyes: Pupils are equal, round, and reactive to light. Conjunctivae and EOM are normal. No scleral icterus.  Glasses.  Blue eyes.   Neurological: She is alert and oriented to person, place, and time.  Skin: She is not diaphoretic.  Psychiatric: She has a normal mood and affect. Her behavior is normal. Judgment and thought content normal.  Nursing note and vitals reviewed.    Appointment on 04/02/2019  Component Date Value Ref Range Status  . WBC 04/02/2019 6.3  4.0 - 10.5 K/uL Final  . RBC 04/02/2019 4.09  3.87 - 5.11 MIL/uL Final  . Hemoglobin 04/02/2019 11.8* 12.0 - 15.0 g/dL Final  . HCT 04/02/2019 37.9  36.0 - 46.0 % Final  . MCV 04/02/2019 92.7  80.0 - 100.0 fL Final  . MCH 04/02/2019 28.9  26.0 - 34.0 pg Final  . MCHC 04/02/2019 31.1  30.0 - 36.0 g/dL Final  . RDW 04/02/2019 14.3  11.5 - 15.5 % Final  . Platelets 04/02/2019 283  150 - 400 K/uL Final  . nRBC 04/02/2019 0.0  0.0 - 0.2 % Final   Performed at [facility], [address] [place], [place] [address]  . Folate 04/02/2019 25.0  >5.9 ng/mL Final   Performed at [facility] [place], [place] [address]  . Ferritin 04/02/2019 21  11 - 307 ng/mL Final   Performed at [facility], [address] [place],  [address]    Assessment:  Andrea Zhang is a 58 y.o. female with recurrent iron deficiency anemia (unclear etiology) and B12 deficiency.  Diet is good.  She has taken oral iron daily for a year.  Work-up on 01/15/2018  revealed a hematocrit of 24, hemoglobin 7.5, MCV 78.2, platelets 299,000, white count 7400.  Ferritin was 7.  TIBC was 560 (high).  Normal studies included: haptoglobin, celiac disease panel, myeloma panel, folate (7.6), urinalysis (no hematuria), and H pylori antigen.  B12 was 210.  Reticulocyte count was 7.5%. TSH was  0.918 on 01/11/2019.  Urinalysis on 02/04/2019 revealed no hematuria.  She has received Feraheme: 510 mg on 01/15/2018, 01/22/2018, 03/19/2018, 03/26/2018, 09/13/2018, 09/27/2018, 10/26/2018, 11/02/2018, and 02/04/2019.   Ferritin has been followed: 7 on 01/15/2018, 12 on 03/12/2018, 165 on 04/19/2018, 39 on 06/11/2018, 15 on 09/10/2018, 60 on 10/24/2018, 96 on 12/10/2018, 22 on 01/31/2019, 62 on 03/01/2019, and 21 on 04/02/2019.  She has B12 deficiency.  She has received B12 injections (last 06/11/2018).  B12 was 241 on 09/12/2017, 210 on 01/15/2018, 1727 on 06/11/2018, and 1467 on 09/10/2018.  She is on oral B12.      She has folate deficiency.  Folate was 5.3 on 04/24 /2020 and 25.0 on 04/02/2019.  She began oral folate on 03/04/2019.  Upper endoscopy on 06/06/2016 revealed a Schatzki's ring which was dilated.  There was evidence of gastritis.  Duodenum was normal.  She has had 2-3 EGDs in the past for strictures requiring dilatation.  Colonoscopy in 2011 was normal.  EGD and colonoscopy in 03/2018 at [facility] was "normal" (no report available).  She states that she had a capsule study in 11/2018 which revealed "erosive gastritis" (no report available).  Symptomatically, she is feeling better.  She denies any bleeding. Hemoglobin is 11.8.  Ferritin is 21.  Plan: 1.   Review labs from 04/02/2019. 2.   Iron deficiency anemia             Etiology of persistent iron deficiency possibly related to erosive gastritis.             She has received multiple infusions of IV iron.             Urinalysis on 02/04/2019 revealed no hematuria.             Discuss ferritin goal of 100.                GI recommends IV iron and PPI.  Review plan for IV iron if ferritin <= 30.             RTC next week for Feraheme. 3.   B12 deficiency             She continues oral B12.             Folate was 25.0 on 04/01/2018.             B12 level was 1467 on 09/10/2018. 4.   RTC next week for Feraheme. 5.   RTC in 2 months for labs (CBc with diff, ferritin). 6.   RTC in 4 months for MD assessment, labs (CBC with diff, ferritin- day before), and +/- Feraheme.  I discussed the assessment and treatment plan with the patient.  The patient was provided an opportunity to ask questions and all were answered.  The patient agreed with the plan and demonstrated an understanding of the instructions.  The patient was advised to call back or seek an in person evaluation if the symptoms worsen or if the condition fails to improve as anticipated.  I provided 15 minutes (1:12 PM - 1:27 PM) of face-to-face video visit time during this this encounter and > 50% was spent counseling as documented under my assessment and plan.  I provided these services from the [facility] office.   Nolon Stalls, MD, PhD  04/03/2019, 1:12 PM  I, Molly Dorshimer, am acting as Education administrator for [employer]. Mike Gip, MD, PhD.  I,  C. Mike Gip, MD, have reviewed the above documentation for accuracy and completeness,  and I agree with the above.

## 2019-04-03 ENCOUNTER — Encounter: Payer: Self-pay | Admitting: Hematology and Oncology

## 2019-04-03 ENCOUNTER — Inpatient Hospital Stay (HOSPITAL_BASED_OUTPATIENT_CLINIC_OR_DEPARTMENT_OTHER): Payer: BLUE CROSS/BLUE SHIELD | Admitting: Hematology and Oncology

## 2019-04-03 ENCOUNTER — Ambulatory Visit: Payer: BLUE CROSS/BLUE SHIELD

## 2019-04-03 DIAGNOSIS — E538 Deficiency of other specified B group vitamins: Secondary | ICD-10-CM | POA: Diagnosis not present

## 2019-04-03 DIAGNOSIS — D509 Iron deficiency anemia, unspecified: Secondary | ICD-10-CM | POA: Diagnosis not present

## 2019-04-03 NOTE — Progress Notes (Signed)
No new changes noted today. The patient Name, DOB and address has been verified by phone today. 

## 2019-04-09 ENCOUNTER — Ambulatory Visit: Payer: BLUE CROSS/BLUE SHIELD

## 2019-04-11 ENCOUNTER — Other Ambulatory Visit: Payer: Self-pay

## 2019-04-11 ENCOUNTER — Inpatient Hospital Stay: Payer: BC Managed Care – PPO | Attending: Hematology and Oncology

## 2019-04-11 VITALS — BP 101/68 | HR 73 | Temp 97.7°F | Resp 18

## 2019-04-11 DIAGNOSIS — D509 Iron deficiency anemia, unspecified: Secondary | ICD-10-CM | POA: Insufficient documentation

## 2019-04-11 MED ORDER — SODIUM CHLORIDE 0.9 % IV SOLN
510.0000 mg | Freq: Once | INTRAVENOUS | Status: AC
  Administered 2019-04-11: 510 mg via INTRAVENOUS
  Filled 2019-04-11: qty 17

## 2019-04-11 MED ORDER — SODIUM CHLORIDE 0.9 % IV SOLN
Freq: Once | INTRAVENOUS | Status: AC
  Administered 2019-04-11: 14:00:00 via INTRAVENOUS
  Filled 2019-04-11: qty 250

## 2019-04-11 NOTE — Patient Instructions (Signed)

## 2019-05-08 ENCOUNTER — Inpatient Hospital Stay: Payer: BC Managed Care – PPO | Attending: Hematology and Oncology

## 2019-05-08 ENCOUNTER — Other Ambulatory Visit: Payer: Self-pay

## 2019-05-08 DIAGNOSIS — D509 Iron deficiency anemia, unspecified: Secondary | ICD-10-CM | POA: Diagnosis not present

## 2019-05-08 LAB — CBC WITH DIFFERENTIAL/PLATELET
Abs Immature Granulocytes: 0.05 10*3/uL (ref 0.00–0.07)
Basophils Absolute: 0.1 10*3/uL (ref 0.0–0.1)
Basophils Relative: 1 %
Eosinophils Absolute: 0.2 10*3/uL (ref 0.0–0.5)
Eosinophils Relative: 3 %
HCT: 40.3 % (ref 36.0–46.0)
Hemoglobin: 12.7 g/dL (ref 12.0–15.0)
Immature Granulocytes: 1 %
Lymphocytes Relative: 17 %
Lymphs Abs: 1.1 10*3/uL (ref 0.7–4.0)
MCH: 28.7 pg (ref 26.0–34.0)
MCHC: 31.5 g/dL (ref 30.0–36.0)
MCV: 91.2 fL (ref 80.0–100.0)
Monocytes Absolute: 0.5 10*3/uL (ref 0.1–1.0)
Monocytes Relative: 8 %
Neutro Abs: 4.4 10*3/uL (ref 1.7–7.7)
Neutrophils Relative %: 70 %
Platelets: 274 10*3/uL (ref 150–400)
RBC: 4.42 MIL/uL (ref 3.87–5.11)
RDW: 14.7 % (ref 11.5–15.5)
WBC: 6.2 10*3/uL (ref 4.0–10.5)
nRBC: 0 % (ref 0.0–0.2)

## 2019-05-08 LAB — FERRITIN: Ferritin: 108 ng/mL (ref 11–307)

## 2019-05-09 ENCOUNTER — Telehealth: Payer: Self-pay

## 2019-05-09 NOTE — Telephone Encounter (Signed)
Pt called in requesting lab results. Advised patient of Ferritin levels and CBC results. Patient verbalizes understanding and denies any further questions. Advised patient to call back in if needed.

## 2019-05-09 NOTE — Telephone Encounter (Signed)
-----   Message from Secundino Ginger sent at 05/09/2019 10:59 AM EDT ----- Regarding: lab results Pt called for lab results

## 2019-05-29 ENCOUNTER — Other Ambulatory Visit: Payer: BLUE CROSS/BLUE SHIELD

## 2019-07-15 IMAGING — MG MM DIGITAL SCREENING BILAT W/ CAD
4 series · 4 of 4 positions shown · non-contrast
Comparison: Previous exam(s).

CLINICAL DATA: Screening.

EXAM:
DIGITAL SCREENING BILATERAL MAMMOGRAM WITH CAD

[R CC]
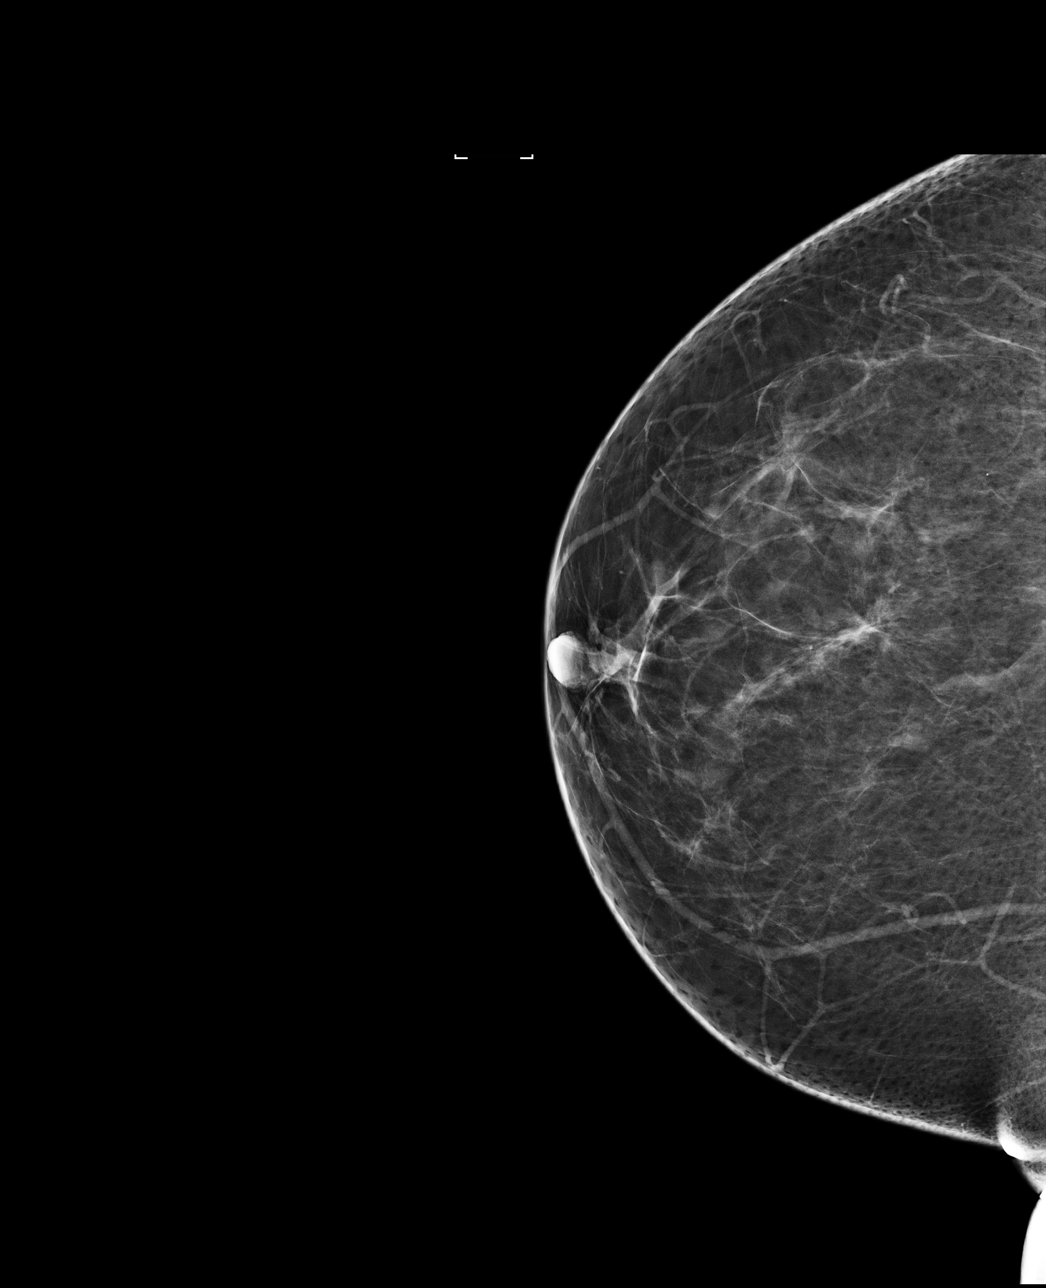

[R MLO]
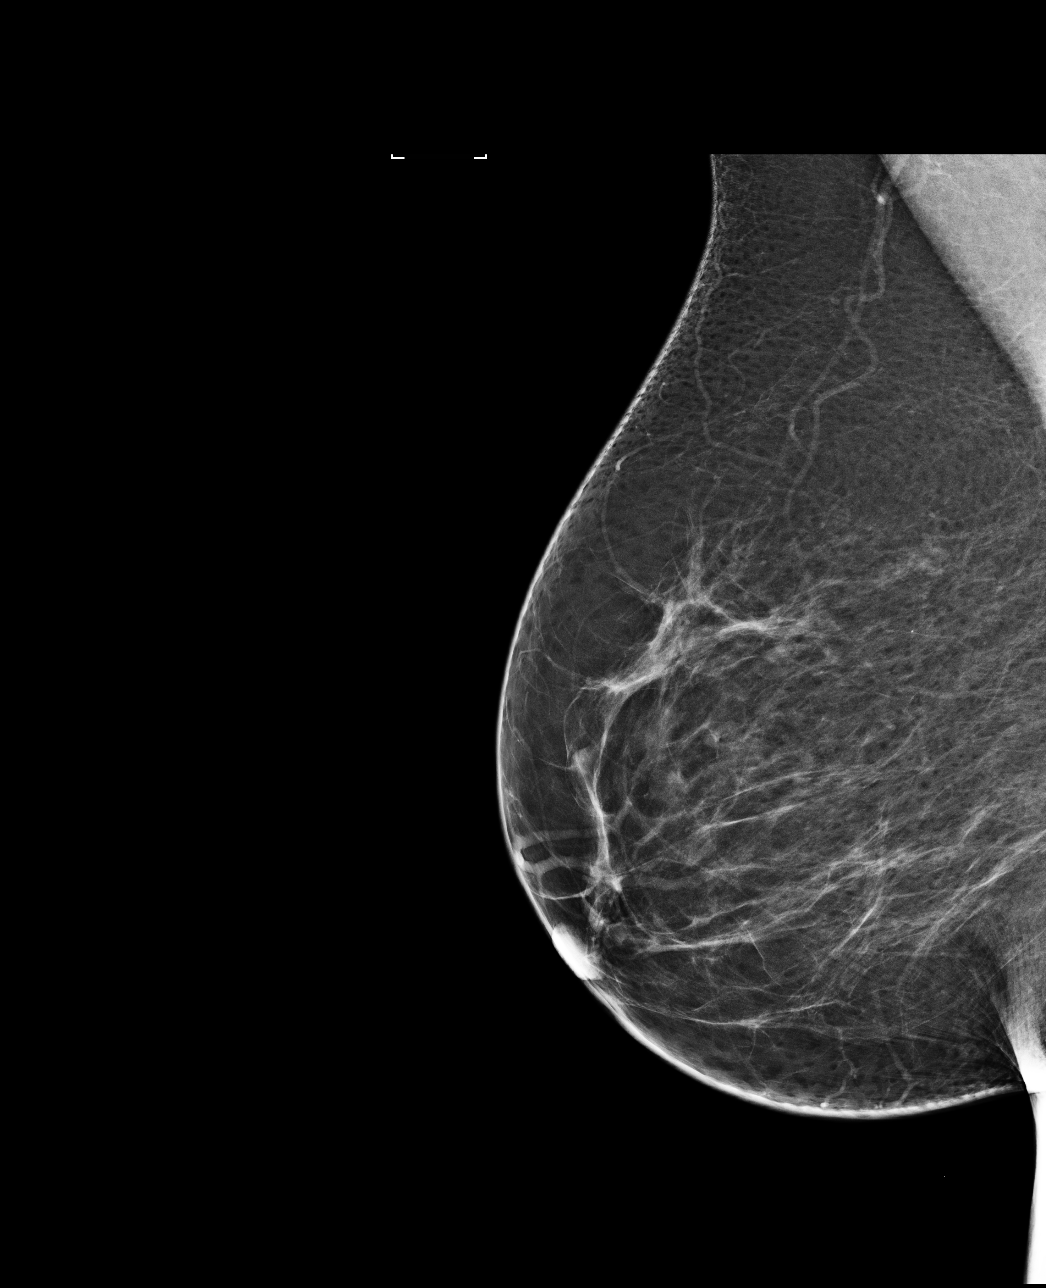

[L CC]
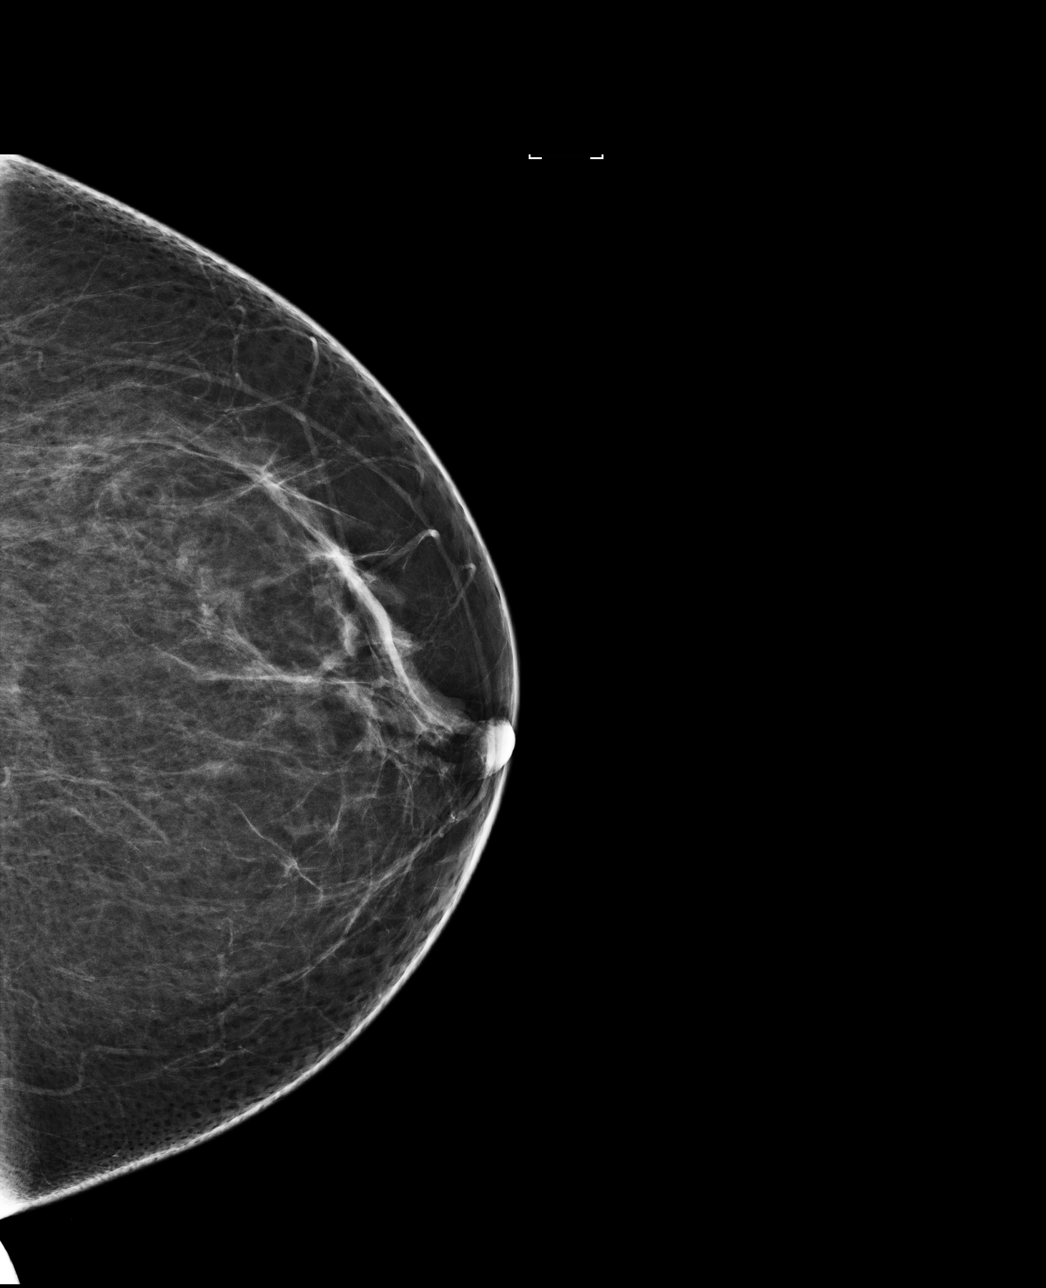

[L MLO]
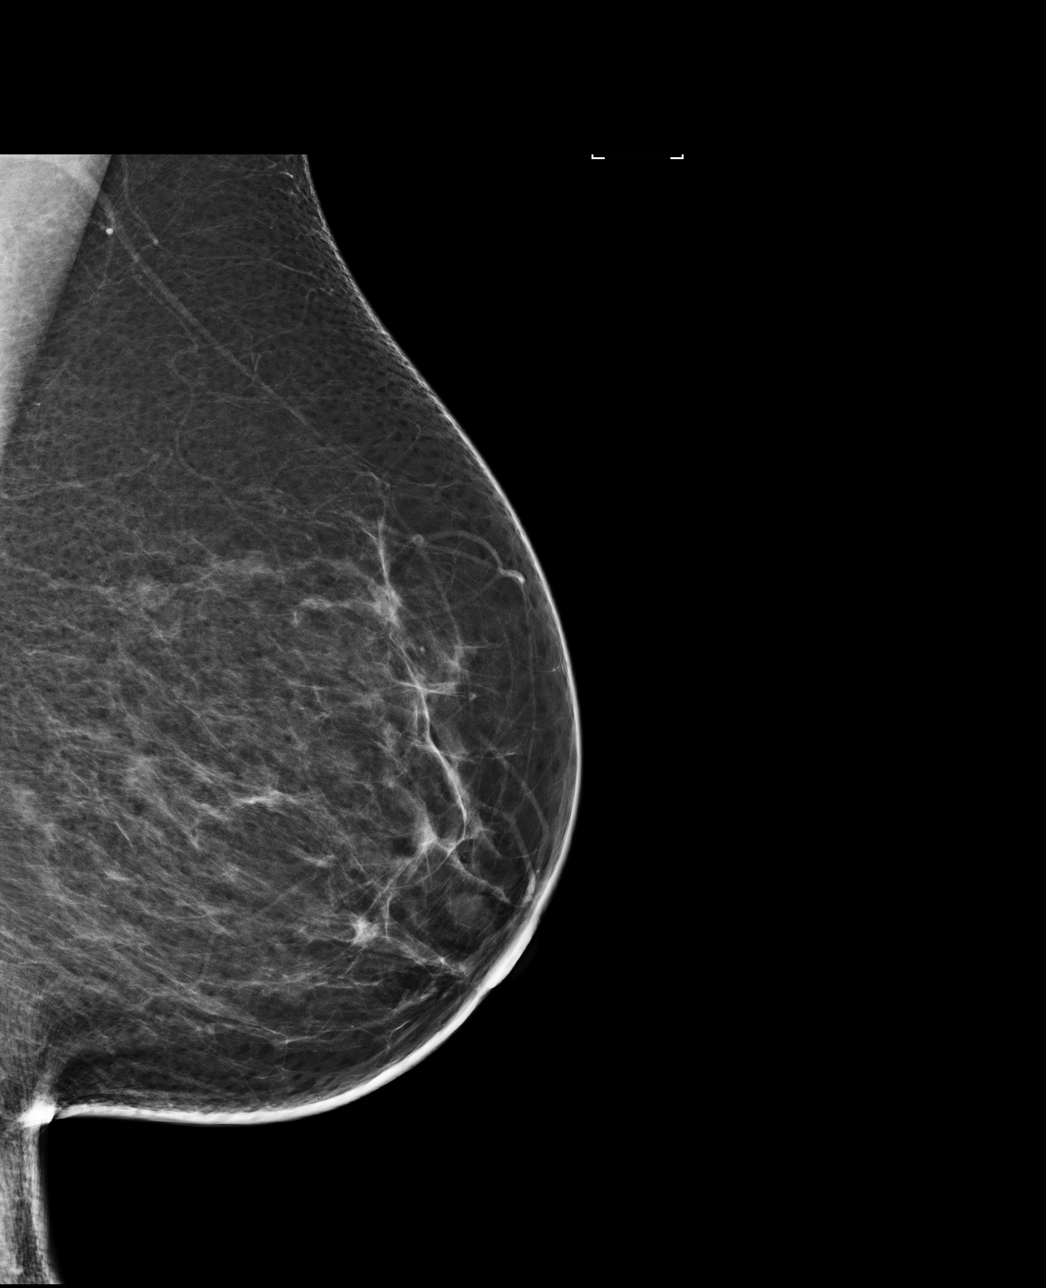

[4 of 4 positions shown; findings below may reference images not displayed]

ACR Breast Density Category b: There are scattered areas of
fibroglandular density.
FINDINGS: There are no findings suspicious for malignancy. Images were
processed with CAD.
IMPRESSION: No mammographic evidence of malignancy. A result letter of this
screening mammogram will be mailed directly to the patient.

RECOMMENDATION:
Screening mammogram in one year. (Code:AS-G-LCT)

BI-RADS CATEGORY  1: Negative.

## 2019-08-06 ENCOUNTER — Other Ambulatory Visit: Payer: Self-pay

## 2019-08-06 ENCOUNTER — Encounter: Payer: Self-pay | Admitting: Hematology and Oncology

## 2019-08-06 ENCOUNTER — Inpatient Hospital Stay: Payer: BC Managed Care – PPO | Attending: Hematology and Oncology

## 2019-08-06 DIAGNOSIS — R1310 Dysphagia, unspecified: Secondary | ICD-10-CM | POA: Insufficient documentation

## 2019-08-06 DIAGNOSIS — E538 Deficiency of other specified B group vitamins: Secondary | ICD-10-CM | POA: Insufficient documentation

## 2019-08-06 DIAGNOSIS — Z8042 Family history of malignant neoplasm of prostate: Secondary | ICD-10-CM | POA: Insufficient documentation

## 2019-08-06 DIAGNOSIS — R42 Dizziness and giddiness: Secondary | ICD-10-CM | POA: Insufficient documentation

## 2019-08-06 DIAGNOSIS — R0602 Shortness of breath: Secondary | ICD-10-CM | POA: Insufficient documentation

## 2019-08-06 DIAGNOSIS — Z8379 Family history of other diseases of the digestive system: Secondary | ICD-10-CM | POA: Diagnosis not present

## 2019-08-06 DIAGNOSIS — Z8249 Family history of ischemic heart disease and other diseases of the circulatory system: Secondary | ICD-10-CM | POA: Insufficient documentation

## 2019-08-06 DIAGNOSIS — Z79899 Other long term (current) drug therapy: Secondary | ICD-10-CM | POA: Insufficient documentation

## 2019-08-06 DIAGNOSIS — R5383 Other fatigue: Secondary | ICD-10-CM | POA: Insufficient documentation

## 2019-08-06 DIAGNOSIS — J309 Allergic rhinitis, unspecified: Secondary | ICD-10-CM | POA: Insufficient documentation

## 2019-08-06 DIAGNOSIS — D509 Iron deficiency anemia, unspecified: Secondary | ICD-10-CM | POA: Diagnosis not present

## 2019-08-06 DIAGNOSIS — M1990 Unspecified osteoarthritis, unspecified site: Secondary | ICD-10-CM | POA: Insufficient documentation

## 2019-08-06 DIAGNOSIS — Z823 Family history of stroke: Secondary | ICD-10-CM | POA: Diagnosis not present

## 2019-08-06 DIAGNOSIS — Z811 Family history of alcohol abuse and dependence: Secondary | ICD-10-CM | POA: Diagnosis not present

## 2019-08-06 DIAGNOSIS — M2550 Pain in unspecified joint: Secondary | ICD-10-CM | POA: Diagnosis not present

## 2019-08-06 DIAGNOSIS — G43909 Migraine, unspecified, not intractable, without status migrainosus: Secondary | ICD-10-CM | POA: Insufficient documentation

## 2019-08-06 DIAGNOSIS — Z832 Family history of diseases of the blood and blood-forming organs and certain disorders involving the immune mechanism: Secondary | ICD-10-CM | POA: Diagnosis not present

## 2019-08-06 DIAGNOSIS — Z820 Family history of epilepsy and other diseases of the nervous system: Secondary | ICD-10-CM | POA: Insufficient documentation

## 2019-08-06 DIAGNOSIS — Z825 Family history of asthma and other chronic lower respiratory diseases: Secondary | ICD-10-CM | POA: Diagnosis not present

## 2019-08-06 DIAGNOSIS — J45909 Unspecified asthma, uncomplicated: Secondary | ICD-10-CM | POA: Insufficient documentation

## 2019-08-06 DIAGNOSIS — I10 Essential (primary) hypertension: Secondary | ICD-10-CM | POA: Insufficient documentation

## 2019-08-06 DIAGNOSIS — E119 Type 2 diabetes mellitus without complications: Secondary | ICD-10-CM | POA: Insufficient documentation

## 2019-08-06 DIAGNOSIS — K219 Gastro-esophageal reflux disease without esophagitis: Secondary | ICD-10-CM | POA: Insufficient documentation

## 2019-08-06 LAB — CBC WITH DIFFERENTIAL/PLATELET
Abs Immature Granulocytes: 0.1 10*3/uL — ABNORMAL HIGH (ref 0.00–0.07)
Basophils Absolute: 0 10*3/uL (ref 0.0–0.1)
Basophils Relative: 1 %
Eosinophils Absolute: 0.2 10*3/uL (ref 0.0–0.5)
Eosinophils Relative: 3 %
HCT: 33.1 % — ABNORMAL LOW (ref 36.0–46.0)
Hemoglobin: 10.1 g/dL — ABNORMAL LOW (ref 12.0–15.0)
Immature Granulocytes: 2 %
Lymphocytes Relative: 18 %
Lymphs Abs: 1.2 10*3/uL (ref 0.7–4.0)
MCH: 27.7 pg (ref 26.0–34.0)
MCHC: 30.5 g/dL (ref 30.0–36.0)
MCV: 90.9 fL (ref 80.0–100.0)
Monocytes Absolute: 0.6 10*3/uL (ref 0.1–1.0)
Monocytes Relative: 8 %
Neutro Abs: 4.5 10*3/uL (ref 1.7–7.7)
Neutrophils Relative %: 68 %
Platelets: 278 10*3/uL (ref 150–400)
RBC: 3.64 MIL/uL — ABNORMAL LOW (ref 3.87–5.11)
RDW: 15.4 % (ref 11.5–15.5)
WBC: 6.6 10*3/uL (ref 4.0–10.5)
nRBC: 0 % (ref 0.0–0.2)

## 2019-08-06 LAB — FERRITIN: Ferritin: 17 ng/mL (ref 11–307)

## 2019-08-06 NOTE — Progress Notes (Signed)
[facility]  [address] [place], [place] [address] Phone: [phone]  Fax: [phone]   Clinic Day:  08/07/2019  Referring physician: [name], *  Chief Complaint: Andrea Zhang is a 58 y.o. female with iron deficiency anemia who is seen for 4 month assessment.  HPI: The patient was last seen in the hematology clinic on 04/03/2019. At that time, she was feeling better.  She denied any bleeding.  Hemoglobin was 11.8.  Ferritin was 21.  She received 510 mg IV Feraheme on 04/11/2019.   During the interim, she has had more frequent migraines over the past 2 months. Energy is "ok".  She denies SOB or chest pain or any abnormal bleeding. She denies any ice cravings.  She has restless legs.  She has not seen gastroenterologist since capsule study since 11/2018.   Labs on 08/06/2019:  Hematocrit 33.1, hemoglobin 10.1, MCV 90.9, platelets 278,000, WBC 6600.  Ferritin was 17.   Past Medical History:  Diagnosis Date   Anemia    iron deficiency and b12 deficiency   Anxiety    Arthritis    Asthma    Diabetes mellitus without complication ([facility])    Fasciitis    left foot   GERD (gastroesophageal reflux disease)    History of kidney stones    Hypercholesterolemia    Hypertension    Migraines    MIGRAINES HAVE IMPROVED SINCE RECEIVING IRON    Past Surgical History:  Procedure Laterality Date   CHOLECYSTECTOMY  2004   COLONOSCOPY WITH ESOPHAGOGASTRODUODENOSCOPY (EGD)  02/2018   ESOPHAGOGASTRODUODENOSCOPY (EGD) WITH PROPOFOL N/A 06/06/2016   Procedure: ESOPHAGOGASTRODUODENOSCOPY (EGD) WITH PROPOFOL;  Surgeon: Manya Silvas, MD;  Location: [facility];  Service: Endoscopy;  Laterality: N/A;   EXTRACORPOREAL SHOCK WAVE LITHOTRIPSY  2010   JOINT REPLACEMENT  2013   LT TKR   SAVORY DILATION N/A 06/06/2016   Procedure: SAVORY DILATION;  Surgeon: Manya Silvas, MD;  [facility] ENDOSCOPY;  Service: Endoscopy;   Laterality: N/A;   SAVORY DILATION  02/2018   SHOULDER ARTHROSCOPY WITH OPEN ROTATOR CUFF REPAIR Right 05/24/2018   Procedure: right shoulder arthroscopy, extensive arthroscopic debridement, decompression, open rotator cuff repair, biceps tenodesis;  Surgeon: Corky Mull, MD;  Location: [facility];  Service: Orthopedics;  Laterality: Right;    Family History  Problem Relation Age of Onset   COPD Mother    Heart disease Mother    Asthma Mother    Schizophrenia Mother    Hemophilia Father    Prostate cancer Father    Heart disease Father    Epilepsy Sister    Stroke Sister    Alcohol abuse Sister    Hypertension Sister    Epilepsy Brother    Lupus Daughter    Gallbladder disease Daughter    Breast cancer Neg Hx     Social History:  reports that she has never smoked. She has never used smokeless tobacco. She reports that she does not drink alcohol or use drugs. She lives in [place]. She has 2 cats named [place] and Autumn.  The patient is alone today.  Allergies:  Allergies  Allergen Reactions   Iodinated Diagnostic Agents Other (See Comments)    Becomes 'unresponsive' to ORAL and IV DYE BETADINE ON THE SKIN IS OKAY unresponsive Patient could hear things but not responsive Becomes 'unresponsive' to ORAL and IV DYE BETADINE ON THE SKIN IS OKAY    Diclofenac Hives    HORRIBLE RASH with  both PATCH OR CREAM   Cephalexin Rash   Maxalt [Rizatriptan Benzoate] Other (See Comments)    'Heart Races'   Orphenadrine Citrate Other (See Comments)    Patient unsure of this allergy.   Zithromax [Azithromycin] Other (See Comments)    Severe Abdominal Cramps   Zomig [Zolmitriptan] Other (See Comments)    'Heart Races'    Current Medications: Current Outpatient Medications  Medication Sig Dispense Refill   albuterol (PROVENTIL HFA;VENTOLIN HFA) 108 (90 Base) MCG/ACT inhaler Inhale 2 puffs into the lungs every 6 (six) hours as needed for wheezing or shortness of  breath.     aspirin EC 81 MG tablet Take 81 mg by mouth daily.     atenolol (TENORMIN) 50 MG tablet Take 50 mg by mouth daily.     cetirizine (ZYRTEC) 10 MG tablet Take 10 mg by mouth as needed for allergies.     Cholecalciferol 2000 units CAPS Take 2,000 Units by mouth daily.     cyclobenzaprine (FLEXERIL) 5 MG tablet Take 5 mg by mouth 3 (three) times daily as needed for muscle spasms.     etodolac (LODINE) 400 MG tablet Take 400 mg by mouth 2 (two) times daily.     ferrous gluconate (FERGON) 324 MG tablet Take 324 mg by mouth daily with breakfast.     Gabapentin Enacarbil 600 MG TBCR Take 1 tablet by mouth every evening.     hydrochlorothiazide (HYDRODIURIL) 12.5 MG tablet Take 12.5 mg by mouth as needed (fluid).     Melatonin 10 MG TABS Take 1 tablet by mouth at bedtime as needed.     mirtazapine (REMERON) 15 MG tablet Take 15 mg by mouth at bedtime.      naratriptan (AMERGE) 2.5 MG tablet Take 2.5 mg by mouth as needed for migraine. Take one (1) tablet at onset of headache; if returns or does not resolve, may repeat after 4 hours; do not exceed five (5) mg in 24 hours.     omeprazole (PRILOSEC) 40 MG capsule Take 40 mg by mouth 2 (two) times daily.     pioglitazone-metformin (ACTOPLUS MET) 15-850 MG tablet Take 1 tablet by mouth 2 (two) times daily with a meal.     promethazine (PHENERGAN) 25 MG tablet Take 25 mg by mouth every 6 (six) hours as needed for nausea or vomiting.     simvastatin (ZOCOR) 10 MG tablet Take 10 mg by mouth daily.     vitamin B-12 (CYANOCOBALAMIN) 1000 MCG tablet Take 1,000 mcg by mouth daily.     No current facility-administered medications for this visit.     Review of Systems  Constitutional: Negative.  Negative for chills, diaphoresis, fever, malaise/fatigue and weight loss (stable).       Feels "ok".  HENT: Negative.  Negative for congestion, hearing loss, nosebleeds, sinus pain and sore throat.   Eyes: Negative.  Negative for blurred  vision and double vision.  Respiratory: Negative.  Negative for cough, sputum production, shortness of breath (occasional) and wheezing.   Cardiovascular: Negative.  Negative for chest pain, palpitations, orthopnea and leg swelling.  Gastrointestinal: Negative.  Negative for abdominal pain, blood in stool, constipation, diarrhea, heartburn, melena, nausea and vomiting.       No ice pica.  Genitourinary: Negative.  Negative for dysuria, frequency and urgency.  Musculoskeletal: Positive for joint pain. Negative for back pain and myalgias.  Skin: Negative.  Negative for rash.  Neurological: Positive for tremors (restless legs). Negative for dizziness, tingling, sensory change, focal  weakness, weakness and headaches.  Endo/Heme/Allergies: Positive for environmental allergies. Does not bruise/bleed easily.  Psychiatric/Behavioral: Negative.  Negative for depression and memory loss. The patient is not nervous/anxious and does not have insomnia.   All other systems reviewed and are negative.  Performance status (ECOG):  1  Vitals Blood pressure 112/73, pulse 88, temperature (!) 97.5 F (36.4 C), temperature source Tympanic, resp. rate 18, height 5' (1.524 m), weight 235 lb 7.2 oz (106.8 kg), SpO2 99 %.   Physical Exam  Constitutional: She is oriented to person, place, and time. She appears well-developed and well-nourished. No distress.  HENT:  Head: Normocephalic and atraumatic.  Mouth/Throat: Oropharynx is clear and moist. No oropharyngeal exudate.  Long light brown hair.  Eyes: Pupils are equal, round, and reactive to light. Conjunctivae and EOM are normal. No scleral icterus.  Glasses.  Blue eyes.   Neck: Normal range of motion. Neck supple. No JVD present.  Cardiovascular: Normal rate, regular rhythm and normal heart sounds. Exam reveals no gallop and no friction rub.  No murmur heard. Pulmonary/Chest: Effort normal and breath sounds normal. She has no wheezes. She has no rales.    Abdominal: Soft. Bowel sounds are normal. She exhibits no distension and no mass. There is no abdominal tenderness. There is no rebound and no guarding.  Musculoskeletal: Normal range of motion.  Lymphadenopathy:    She has no cervical adenopathy.  Neurological: She is alert and oriented to person, place, and time.  Skin: Skin is warm and dry. No rash noted. She is not diaphoretic. No erythema. No pallor.  Psychiatric: She has a normal mood and affect. Her behavior is normal. Judgment and thought content normal.  Nursing note and vitals reviewed.   Appointment on 08/06/2019  Component Date Value Ref Range Status   Ferritin 08/06/2019 17  11 - 307 ng/mL Final   Performed at [facility], [address] [place], [place] [address]   WBC 08/06/2019 6.6  4.0 - 10.5 K/uL Final   RBC 08/06/2019 3.64* 3.87 - 5.11 MIL/uL Final   Hemoglobin 08/06/2019 10.1* 12.0 - 15.0 g/dL Final   HCT 08/06/2019 33.1* 36.0 - 46.0 % Final   MCV 08/06/2019 90.9  80.0 - 100.0 fL Final   MCH 08/06/2019 27.7  26.0 - 34.0 pg Final   MCHC 08/06/2019 30.5  30.0 - 36.0 g/dL Final   RDW 08/06/2019 15.4  11.5 - 15.5 % Final   Platelets 08/06/2019 278  150 - 400 K/uL Final   nRBC 08/06/2019 0.0  0.0 - 0.2 % Final   Neutrophils Relative % 08/06/2019 68  % Final   Neutro Abs 08/06/2019 4.5  1.7 - 7.7 K/uL Final   Lymphocytes Relative 08/06/2019 18  % Final   Lymphs Abs 08/06/2019 1.2  0.7 - 4.0 K/uL Final   Monocytes Relative 08/06/2019 8  % Final   Monocytes Absolute 08/06/2019 0.6  0.1 - 1.0 K/uL Final   Eosinophils Relative 08/06/2019 3  % Final   Eosinophils Absolute 08/06/2019 0.2  0.0 - 0.5 K/uL Final   Basophils Relative 08/06/2019 1  % Final   Basophils Absolute 08/06/2019 0.0  0.0 - 0.1 K/uL Final   Immature Granulocytes 08/06/2019 2  % Final   Abs Immature Granulocytes 08/06/2019 0.10* 0.00 - 0.07 K/uL Final   Performed at [facility], [address]  [place], [place] [address]    Assessment:  Andrea Zhang is a 58 y.o. female with recurrent iron deficiency anemia(unclear etiology)  and B12 deficiency. Dietis good. She has taken oral irondaily for a year.  Work-up on03/11/2019revealed a hematocrit of 24, hemoglobin 7.5, MCV 78.2, platelets 299,000, white count 7400. Ferritinwas 7. TIBC was560(high). Normal studies included: haptoglobin, celiac disease panel, myeloma panel, folate (7.6), urinalysis (no hematuria), and H pylori antigen. B12was 210. Reticulocyte count was 7.5%. TSHwas 0.918 on 01/11/2019.  Urinalysis on 02/04/2019 revealed no hematuria.  She has received Feraheme: 510 mg on 01/15/2018, 01/22/2018, 03/19/2018, 03/26/2018, 09/13/2018, 09/27/2018, 10/26/2018, 11/02/2018, 02/04/2019, and 04/11/2019.  Ferritinhas been followed: 7 on03/09/2018, 12 on05/04/2018, 165 on06/13/2019, 39 on08/03/2018, 15 on 09/10/2018, 60 on 10/24/2018, 96 on02/01/2019,22 on 01/31/2019, 62 on 03/01/2019, 21 on 04/02/2019, 108 on 05/08/2019, and 17 on 08/06/2019.  She has B12 deficiency. She has received B12 injections (last 06/11/2018). [id number] on11/04/2017, 210 on 01/15/2018, 1727 on 06/11/2018, and 1467 on 09/10/2018.  She is on oral B12.     She has folate deficiency.  Folatewas 5.3 on 04/24 /2020 and 25.0 on 04/02/2019.  She began oral folate on 03/04/2019.  Upper endoscopyon 07/31/2017revealed a Schatzki's ring which was dilated. There was evidence of gastritis. Duodenum was normal. She has had2-3 EGDs in the past for strictures requiring dilatation. Colonoscopyin 2011 was normal. EGD and colonoscopyin 03/2018 at [facility] was "normal"(no report available). She states that she had a capsule studyin 11/2018 which revealed "erosive gastritis" (no report available).  Symptomatically, she feels "ok".  Exam is stable.  Hemoglobin is 10.1.  Ferritin is 17.  Plan: 1.   Review labs from 08/06/2019. 2.Iron deficiency  anemia Hematocrit 40.3.  Hemoglobin 12.7.  MCV 91.2 on 05/08/2019.   Hematocrit 33.1.  Hemoglobin 10.1.  MCV 90.9 on 08/06/2019.   Ferritin 108 on 05/08/2019 and 17 on 08/06/2019.    Etiology of persistent iron deficiency possibly related to erosive gastritis.   GI recommends IV iron and PPI.   Encourage follow-up with GI. She has received multiple infusions of IV iron. Urinalysis on 02/04/2019 revealed no hematuria. Ferritin goal is 100.  Feraheme today and in 1 week. 3.B12 deficiency She continues oral B12. Folate was 25.0 on 04/02/2019. B12 level was 1467 on 09/10/2018.  Continue to monitor. 4.   RTC in 3 months for labs (CBC with diff, ferritin). 5.   RTC in 5 months for MD assessment, labs (CBC with diff, ferritin- day before), and +/- Feraheme.  I discussed the assessment and treatment plan with the patient.  The patient was provided an opportunity to ask questions and all were answered.  The patient agreed with the plan and demonstrated an understanding of the instructions.  The patient was advised to call back if the symptoms worsen or if the condition fails to improve as anticipated.   Lequita Asal, MD, PhD    08/07/2019, 4:37 PM  I, Samul Dada, am acting as a scribe for Lequita Asal, MD.  I, [place] Mike Gip, MD, have reviewed the above documentation for accuracy and completeness, and I agree with the above.

## 2019-08-06 NOTE — Progress Notes (Signed)
Patient stated that she had been doing well except at night time. Patient stated that she takes Melatonin to help her sleep but it is not working. Patient would like to know if something else could be given.

## 2019-08-07 ENCOUNTER — Inpatient Hospital Stay: Payer: BC Managed Care – PPO

## 2019-08-07 ENCOUNTER — Inpatient Hospital Stay (HOSPITAL_BASED_OUTPATIENT_CLINIC_OR_DEPARTMENT_OTHER): Payer: BC Managed Care – PPO | Admitting: Hematology and Oncology

## 2019-08-07 ENCOUNTER — Encounter: Payer: Self-pay | Admitting: Hematology and Oncology

## 2019-08-07 VITALS — BP 105/72 | HR 71 | Resp 18

## 2019-08-07 VITALS — BP 112/73 | HR 88 | Temp 97.5°F | Resp 18 | Ht 60.0 in | Wt 235.5 lb

## 2019-08-07 DIAGNOSIS — E538 Deficiency of other specified B group vitamins: Secondary | ICD-10-CM

## 2019-08-07 DIAGNOSIS — D509 Iron deficiency anemia, unspecified: Secondary | ICD-10-CM

## 2019-08-07 MED ORDER — SODIUM CHLORIDE 0.9 % IV SOLN
Freq: Once | INTRAVENOUS | Status: AC
  Administered 2019-08-07: 14:00:00 via INTRAVENOUS
  Filled 2019-08-07: qty 250

## 2019-08-07 MED ORDER — SODIUM CHLORIDE 0.9 % IV SOLN
510.0000 mg | Freq: Once | INTRAVENOUS | Status: AC
  Administered 2019-08-07: 510 mg via INTRAVENOUS
  Filled 2019-08-07: qty 17

## 2019-08-07 NOTE — Patient Instructions (Signed)

## 2019-08-07 NOTE — Progress Notes (Signed)
Patient c/o headache ( pain level 6)

## 2019-08-14 ENCOUNTER — Ambulatory Visit: Payer: BC Managed Care – PPO

## 2019-08-16 ENCOUNTER — Ambulatory Visit: Payer: BC Managed Care – PPO

## 2019-08-19 ENCOUNTER — Inpatient Hospital Stay: Payer: BC Managed Care – PPO | Attending: Hematology and Oncology

## 2019-08-19 ENCOUNTER — Other Ambulatory Visit: Payer: Self-pay

## 2019-08-19 VITALS — BP 113/79 | HR 78 | Temp 96.6°F | Resp 18

## 2019-08-19 DIAGNOSIS — Z79899 Other long term (current) drug therapy: Secondary | ICD-10-CM | POA: Insufficient documentation

## 2019-08-19 DIAGNOSIS — Z823 Family history of stroke: Secondary | ICD-10-CM | POA: Diagnosis not present

## 2019-08-19 DIAGNOSIS — R251 Tremor, unspecified: Secondary | ICD-10-CM | POA: Insufficient documentation

## 2019-08-19 DIAGNOSIS — Z8249 Family history of ischemic heart disease and other diseases of the circulatory system: Secondary | ICD-10-CM | POA: Diagnosis not present

## 2019-08-19 DIAGNOSIS — Z811 Family history of alcohol abuse and dependence: Secondary | ICD-10-CM | POA: Diagnosis not present

## 2019-08-19 DIAGNOSIS — Z832 Family history of diseases of the blood and blood-forming organs and certain disorders involving the immune mechanism: Secondary | ICD-10-CM | POA: Diagnosis not present

## 2019-08-19 DIAGNOSIS — Z8042 Family history of malignant neoplasm of prostate: Secondary | ICD-10-CM | POA: Diagnosis not present

## 2019-08-19 DIAGNOSIS — Z8379 Family history of other diseases of the digestive system: Secondary | ICD-10-CM | POA: Insufficient documentation

## 2019-08-19 DIAGNOSIS — Z818 Family history of other mental and behavioral disorders: Secondary | ICD-10-CM | POA: Diagnosis not present

## 2019-08-19 DIAGNOSIS — Z820 Family history of epilepsy and other diseases of the nervous system: Secondary | ICD-10-CM | POA: Diagnosis not present

## 2019-08-19 DIAGNOSIS — D509 Iron deficiency anemia, unspecified: Secondary | ICD-10-CM

## 2019-08-19 DIAGNOSIS — E119 Type 2 diabetes mellitus without complications: Secondary | ICD-10-CM | POA: Insufficient documentation

## 2019-08-19 DIAGNOSIS — J45909 Unspecified asthma, uncomplicated: Secondary | ICD-10-CM | POA: Insufficient documentation

## 2019-08-19 DIAGNOSIS — K219 Gastro-esophageal reflux disease without esophagitis: Secondary | ICD-10-CM | POA: Diagnosis not present

## 2019-08-19 DIAGNOSIS — I10 Essential (primary) hypertension: Secondary | ICD-10-CM | POA: Insufficient documentation

## 2019-08-19 DIAGNOSIS — M1990 Unspecified osteoarthritis, unspecified site: Secondary | ICD-10-CM | POA: Insufficient documentation

## 2019-08-19 DIAGNOSIS — M2550 Pain in unspecified joint: Secondary | ICD-10-CM | POA: Diagnosis not present

## 2019-08-19 DIAGNOSIS — E538 Deficiency of other specified B group vitamins: Secondary | ICD-10-CM | POA: Diagnosis not present

## 2019-08-19 DIAGNOSIS — G43909 Migraine, unspecified, not intractable, without status migrainosus: Secondary | ICD-10-CM | POA: Insufficient documentation

## 2019-08-19 DIAGNOSIS — Z836 Family history of other diseases of the respiratory system: Secondary | ICD-10-CM | POA: Diagnosis not present

## 2019-08-19 MED ORDER — SODIUM CHLORIDE 0.9 % IV SOLN
Freq: Once | INTRAVENOUS | Status: AC
  Administered 2019-08-19: 10:00:00 via INTRAVENOUS
  Filled 2019-08-19: qty 250

## 2019-08-19 MED ORDER — SODIUM CHLORIDE 0.9 % IV SOLN
510.0000 mg | Freq: Once | INTRAVENOUS | Status: AC
  Administered 2019-08-19: 510 mg via INTRAVENOUS
  Filled 2019-08-19: qty 17

## 2019-08-19 MED ORDER — FERUMOXYTOL INJECTION 510 MG/17 ML
INTRAVENOUS | Status: AC
  Filled 2019-08-19: qty 17

## 2019-08-21 ENCOUNTER — Emergency Department: Payer: BC Managed Care – PPO

## 2019-08-21 ENCOUNTER — Other Ambulatory Visit: Payer: Self-pay

## 2019-08-21 ENCOUNTER — Emergency Department
Admission: EM | Admit: 2019-08-21 | Discharge: 2019-08-21 | Disposition: A | Discharge status: Home / Self Care | Payer: BC Managed Care – PPO | Attending: Emergency Medicine | Admitting: Emergency Medicine

## 2019-08-21 DIAGNOSIS — Z79899 Other long term (current) drug therapy: Secondary | ICD-10-CM | POA: Insufficient documentation

## 2019-08-21 DIAGNOSIS — Z96652 Presence of left artificial knee joint: Secondary | ICD-10-CM | POA: Diagnosis not present

## 2019-08-21 DIAGNOSIS — K5641 Fecal impaction: Secondary | ICD-10-CM | POA: Diagnosis not present

## 2019-08-21 DIAGNOSIS — J45909 Unspecified asthma, uncomplicated: Secondary | ICD-10-CM | POA: Diagnosis not present

## 2019-08-21 DIAGNOSIS — K5900 Constipation, unspecified: Secondary | ICD-10-CM

## 2019-08-21 DIAGNOSIS — R140 Abdominal distension (gaseous): Secondary | ICD-10-CM | POA: Insufficient documentation

## 2019-08-21 DIAGNOSIS — I10 Essential (primary) hypertension: Secondary | ICD-10-CM | POA: Insufficient documentation

## 2019-08-21 DIAGNOSIS — Z7984 Long term (current) use of oral hypoglycemic drugs: Secondary | ICD-10-CM | POA: Diagnosis not present

## 2019-08-21 DIAGNOSIS — Z7982 Long term (current) use of aspirin: Secondary | ICD-10-CM | POA: Diagnosis not present

## 2019-08-21 DIAGNOSIS — E119 Type 2 diabetes mellitus without complications: Secondary | ICD-10-CM | POA: Insufficient documentation

## 2019-08-21 LAB — COMPREHENSIVE METABOLIC PANEL
ALT: 18 U/L (ref 0–44)
AST: 16 U/L (ref 15–41)
Albumin: 3.8 g/dL (ref 3.5–5.0)
Alkaline Phosphatase: 77 U/L (ref 38–126)
Anion gap: 9 (ref 5–15)
BUN: 14 mg/dL (ref 6–20)
CO2: 29 mmol/L (ref 22–32)
Calcium: 10.4 mg/dL — ABNORMAL HIGH (ref 8.9–10.3)
Chloride: 101 mmol/L (ref 98–111)
Creatinine, Ser: 0.75 mg/dL (ref 0.44–1.00)
GFR calc Af Amer: >60 mL/min (ref >60)
GFR calc non Af Amer: >60 mL/min (ref >60)
Glucose, Bld: 122 mg/dL — ABNORMAL HIGH (ref 70–99)
Potassium: 3.6 mmol/L (ref 3.5–5.1)
Sodium: 139 mmol/L (ref 135–145)
Total Bilirubin: 0.8 mg/dL (ref 0.3–1.2)
Total Protein: 7.4 g/dL (ref 6.5–8.1)

## 2019-08-21 LAB — CBC
HCT: 40.6 % (ref 36.0–46.0)
Hemoglobin: 12.6 g/dL (ref 12.0–15.0)
MCH: 27.7 pg (ref 26.0–34.0)
MCHC: 31 g/dL (ref 30.0–36.0)
MCV: 89.2 fL (ref 80.0–100.0)
Platelets: 420 10*3/uL — ABNORMAL HIGH (ref 150–400)
RBC: 4.55 MIL/uL (ref 3.87–5.11)
RDW: 15.3 % (ref 11.5–15.5)
WBC: 12.7 10*3/uL — ABNORMAL HIGH (ref 4.0–10.5)
nRBC: 0 % (ref 0.0–0.2)

## 2019-08-21 LAB — MAGNESIUM: Magnesium: 1.7 mg/dL (ref 1.7–2.4)

## 2019-08-21 LAB — LIPASE, BLOOD: Lipase: 17 U/L (ref 11–51)

## 2019-08-21 MED ORDER — ONDANSETRON HCL 4 MG/2ML IJ SOLN
4.0000 mg | Freq: Once | INTRAMUSCULAR | Status: AC
  Administered 2019-08-21: 4 mg via INTRAVENOUS
  Filled 2019-08-21: qty 2

## 2019-08-21 MED ORDER — POLYETHYLENE GLYCOL 3350 17 G PO PACK: 17.0000 g | PACK | Freq: Every day | ORAL | 0 refills | Status: DC

## 2019-08-21 MED ORDER — SODIUM CHLORIDE 0.9 % IV BOLUS
500.0000 mL | Freq: Once | INTRAVENOUS | Status: AC
  Administered 2019-08-21: 20:00:00 500 mL via INTRAVENOUS

## 2019-08-21 MED ORDER — LACTULOSE 10 GM/15ML PO SOLN
30.0000 g | Freq: Once | ORAL | Status: AC
  Administered 2019-08-21: 22:00:00 30 g via ORAL
  Filled 2019-08-21: qty 60

## 2019-08-21 NOTE — ED Provider Notes (Signed)
Discussed results of CT scan with patient.  She feels much improved after successful enema.  I do believe she stable and appropriate for outpatient follow-up.   Merlyn Lot, MD 08/21/19 2230

## 2019-08-21 NOTE — ED Notes (Signed)
Soap suds enema completed by this RN and Myah, NT. Pt tolerated well with resulting bowel movement.

## 2019-08-21 NOTE — ED Triage Notes (Addendum)
Reports constipation X 2 weeks. approx 14-16 days since last BM. Has tried OTC medications without relief. No hx of same. Reports bloating. Pt is passing gas.  Pt alert and oriented X4, cooperative, RR even and unlabored, color WNL. Pt in NAD.

## 2019-08-21 NOTE — ED Provider Notes (Signed)
[facility] Emergency Department Provider Note ____________________________________________   First MD Initiated Contact with Patient 08/21/19 1916     (approximate)  I have reviewed the triage vital signs and the nursing notes.  HISTORY  Chief Complaint Constipation  HPI Andrea Zhang is a 58 y.o. female here for evaluation of feeling very constipated  Patient reports for about 2 weeks now she is felt constipated, only able to pass small amounts of gas and small amounts of stool.  Continued to have bowel movements that are very small and, reports she feels just very constipated.  She does report that she has had recent endoscopy, colonoscopy, and pill study and still following with gastroenterology due to anemia and also seeing oncology and she receives oral iron as well as iron infusions she thinks may be responsible, but is never felt constipated this extent in the past  Denies that her abdomen he is on a pain, more just feels full and backed up.  Denies pain at the rectum.  No COVID exposure.  No fevers chills cough chest pain or trouble breathing has been using some constipation pills such as Colace and Dulcolax without effect   Past Medical History:  Diagnosis Date  . Anemia    iron deficiency and b12 deficiency  . Anxiety   . Arthritis   . Asthma   . Diabetes mellitus without complication ([place])   . Fasciitis    left foot  . GERD (gastroesophageal reflux disease)   . History of kidney stones   . Hypercholesterolemia   . Hypertension   . Migraines    MIGRAINES HAVE IMPROVED SINCE RECEIVING IRON    Patient Active Problem List   Diagnosis Date Noted  . Allergic rhinitis 08/06/2019  . Asthma 08/06/2019  . Diabetes mellitus, type 2 ([place]) 08/06/2019  . Dysphagia 08/06/2019  . GERD (gastroesophageal reflux disease) 08/06/2019  . Hypercalcemia 08/06/2019  . Hypertension 08/06/2019  . Migraines 08/06/2019  . Osteoarthritis 08/06/2019  . Folate  deficiency 03/03/2019  . Morbid obesity with BMI of 40.0-44.9, adult ([place]) 06/05/2018  . Status post right rotator cuff repair 06/05/2018  . Degenerative joint disease of right shoulder 05/24/2018  . Degenerative tear of glenoid labrum, right 05/24/2018  . Injury of tendon of long head of right biceps 05/24/2018  . Nontraumatic complete tear of right rotator cuff 05/14/2018  . Rotator cuff tendinitis, right 05/14/2018  . Iron deficiency anemia 01/15/2018  . B12 deficiency 09/07/2017  . Microalbuminuric diabetic nephropathy ([place]) 09/07/2017  . Primary osteoarthritis of right knee 02/19/2015  . Restless leg syndrome 10/25/2012  . Hyperlipidemia with target LDL less than 100 05/20/2012    Past Surgical History:  Procedure Laterality Date  . CHOLECYSTECTOMY  2004  . COLONOSCOPY WITH ESOPHAGOGASTRODUODENOSCOPY (EGD)  02/2018  . ESOPHAGOGASTRODUODENOSCOPY (EGD) WITH PROPOFOL N/A 06/06/2016   Procedure: ESOPHAGOGASTRODUODENOSCOPY (EGD) WITH PROPOFOL;  Surgeon: Manya Silvas, MD;  Location: [facility] ENDOSCOPY;  Service: Endoscopy;  Laterality: N/A;  . EXTRACORPOREAL SHOCK WAVE LITHOTRIPSY  2010  . JOINT REPLACEMENT  2013   LT TKR  . SAVORY DILATION N/A 06/06/2016   Procedure: SAVORY DILATION;  Surgeon: Manya Silvas, MD;  Location: [facility] ENDOSCOPY;  Service: Endoscopy;  Laterality: N/A;  . SAVORY DILATION  02/2018  . SHOULDER ARTHROSCOPY WITH OPEN ROTATOR CUFF REPAIR Right 05/24/2018   Procedure: right shoulder arthroscopy, extensive arthroscopic debridement, decompression, open rotator cuff repair, biceps tenodesis;  Surgeon: Corky Mull, MD;  Location: [facility] ORS;  Service: Orthopedics;  Laterality: Right;    Prior to Admission medications   Medication Sig Start Date End Date Taking? Authorizing Provider  albuterol (PROVENTIL HFA;VENTOLIN HFA) 108 (90 Base) MCG/ACT inhaler Inhale 2 puffs into the lungs every 6 (six) hours as needed for wheezing or shortness of breath.    [provider]  aspirin EC 81 MG tablet Take 81 mg by mouth daily.    [provider]  atenolol (TENORMIN) 50 MG tablet Take 50 mg by mouth daily.    [provider]  cetirizine (ZYRTEC) 10 MG tablet Take 10 mg by mouth as needed for allergies.    [provider]  Cholecalciferol 2000 units CAPS Take 2,000 Units by mouth daily.    [provider]  cyclobenzaprine (FLEXERIL) 5 MG tablet Take 5 mg by mouth 3 (three) times daily as needed for muscle spasms.    [provider]  etodolac (LODINE) 400 MG tablet Take 400 mg by mouth 2 (two) times daily.    [provider]  ferrous gluconate (FERGON) 324 MG tablet Take 324 mg by mouth daily with breakfast.    [provider]  Gabapentin Enacarbil 600 MG TBCR Take 1 tablet by mouth every evening.    [provider]  hydrochlorothiazide (HYDRODIURIL) 12.5 MG tablet Take 12.5 mg by mouth as needed (fluid).    [provider]  Melatonin 10 MG TABS Take 1 tablet by mouth at bedtime as needed.    [provider]  mirtazapine (REMERON) 15 MG tablet Take 15 mg by mouth at bedtime.     [provider]  naratriptan (AMERGE) 2.5 MG tablet Take 2.5 mg by mouth as needed for migraine. Take one (1) tablet at onset of headache; if returns or does not resolve, may repeat after 4 hours; do not exceed five (5) mg in 24 hours.    [provider]  omeprazole (PRILOSEC) 40 MG capsule Take 40 mg by mouth 2 (two) times daily.    [provider]  pioglitazone-metformin (ACTOPLUS MET) 15-850 MG tablet Take 1 tablet by mouth 2 (two) times daily with a meal.    [provider]  promethazine (PHENERGAN) 25 MG tablet Take 25 mg by mouth every 6 (six) hours as needed for nausea or vomiting.    [provider]  simvastatin (ZOCOR) 10 MG tablet Take 10 mg by mouth daily.    [provider]  vitamin B-12 (CYANOCOBALAMIN) 1000 MCG tablet Take 1,000  mcg by mouth daily.    [provider]    Allergies Iodinated diagnostic agents, Diclofenac, Cephalexin, Maxalt [rizatriptan benzoate], Orphenadrine citrate, Zithromax [azithromycin], and Zomig [zolmitriptan]  Family History  Problem Relation Age of Onset  . COPD Mother   . Heart disease Mother   . Asthma Mother   . Schizophrenia Mother   . Hemophilia Father   . Prostate cancer Father   . Heart disease Father   . Epilepsy Sister   . Stroke Sister   . Alcohol abuse Sister   . Hypertension Sister   . Epilepsy Brother   . Lupus Daughter   . Gallbladder disease Daughter   . Breast cancer Neg Hx     Social History Social History   Tobacco Use  . Smoking status: Never Smoker  . Smokeless tobacco: Never Used  Substance Use Topics  . Alcohol use: No  . Drug use: No    Review of Systems Constitutional: No fever/chills Eyes: No visual changes. ENT: No sore throat.  Cardiovascular: Denies chest pain. Respiratory: Denies shortness of breath. Gastrointestinal: See HPI Skin: Negative for rash. Neurological: Negative for headaches, areas of focal weakness or numbness.    ____________________________________________   PHYSICAL EXAM:  VITAL SIGNS: ED Triage Vitals [08/21/19 1744]  Enc Vitals Group     BP 132/79     Pulse Rate 94     Resp 18     Temp 98 F (36.7 C)     Temp Source Oral     SpO2 95 %     Weight 233 lb 11 oz (106 kg)     Height 5' 4"  (1.626 m)     Head Circumference      Peak Flow      Pain Score 0     Pain Loc      Pain Edu?      Excl. in [place]?     Constitutional: Alert and oriented. Well appearing and in no acute distress. Eyes: Conjunctivae are normal. Head: Atraumatic. Nose: No congestion/rhinnorhea. Mouth/Throat: Mucous membranes are moist. Neck: No stridor.  Cardiovascular: Normal rate, regular rhythm. Grossly normal heart sounds.  Good peripheral circulation. Respiratory: Normal respiratory effort.  No retractions. Lungs CTAB.  Gastrointestinal: Soft and nontender except for just mild tenderness without focality without rebound or guarding.  Soft but slightly distended.  Bowel sounds are present but diminished.  No high frequency bowel sounds Musculoskeletal: No lower extremity tenderness nor edema. Neurologic:  Normal speech and language. No gross focal neurologic deficits are appreciated.  Skin:  Skin is warm, dry and intact. No rash noted. Psychiatric: Mood and affect are normal. Speech and behavior are normal.  ____________________________________________   LABS (all labs ordered are listed, but only abnormal results are displayed)  Labs Reviewed  CBC - Abnormal; Notable for the following components:      Result Value   WBC 12.7 (*)    Platelets 420 (*)    All other components within normal limits  COMPREHENSIVE METABOLIC PANEL - Abnormal; Notable for the following components:   Glucose, Bld 122 (*)    Calcium 10.4 (*)    All other components within normal limits  LIPASE, BLOOD  MAGNESIUM   ____________________________________________  EKG   ____________________________________________  RADIOLOGY  Ct Abdomen Pelvis Wo Contrast  Result Date: 08/21/2019 CLINICAL DATA:  58 year old female with abdominal pain. EXAM: CT ABDOMEN AND PELVIS WITHOUT CONTRAST TECHNIQUE: Multidetector CT imaging of the abdomen and pelvis was performed following the standard protocol without IV contrast. COMPARISON:  Abdominal ultrasound dated 12/24/2015 FINDINGS: Evaluation of this exam is limited in the absence of intravenous contrast. Lower chest: The visualized lung bases are clear. There is coronary vascular calcification. No intra-abdominal free air. Trace free fluid in the pelvis. Hepatobiliary: Diffuse fatty infiltration of the liver. No intrahepatic biliary ductal dilatation. Cholecystectomy. No retained calcified stone noted in the central CBD. Pancreas: Unremarkable. No pancreatic ductal dilatation or surrounding  inflammatory changes. Spleen: Normal in size without focal abnormality. Adrenals/Urinary Tract: The adrenal glands are unremarkable. There is a 3 mm nonobstructing right renal upper pole calculus. No hydronephrosis. Subcentimeter partially exophytic hypodense lesion from the medial inferior pole of the right kidney is not characterized on this CT but may represent a cyst. There is no hydronephrosis or nephrolithiasis on the left. The visualized ureters and urinary bladder appear unremarkable. Stomach/Bowel: There is sigmoid diverticulosis with muscular hypertrophy. There is mild haziness of the pericecal moist fat which may be chronic. Mild acute diverticulitis is less likely but not  entirely excluded. Clinical correlation is recommended. There is large amount of stool noted throughout the colon proximal to this thickened segment of the sigmoid consistent with a degree of fecal impaction. This is likely related to narrowing of the lumen of the sigmoid colon as a result of chronic inflammation and muscular hypertrophy. An infiltrative mass is not entirely excluded. This can be better evaluated with sigmoidoscopy. There is a moderate size hiatal hernia. There is no bowel obstruction. The appendix is normal. Vascular/Lymphatic: Mild atherosclerotic calcification of the aorta. The IVC is unremarkable. No portal venous gas. There is no adenopathy. Reproductive: The uterus and ovaries are grossly unremarkable. No adnexal masses. Other: Small fat containing umbilical hernia. No fluid collection. Musculoskeletal: Scoliosis with degenerative changes of the spine. No acute osseous pathology. IMPRESSION: 1. Sigmoid diverticulosis with muscular hypertrophy and a degree of luminal narrowing. Perisigmoid haziness, likely chronic. Mild acute inflammation is favored less likely. Clinical correlation is recommended. 2. Constipation with fecal impaction proximal to the sigmoid colon. Please see discussion above. No bowel obstruction.  Normal appendix. 3. Fatty liver. 4. A 3 mm nonobstructing right renal upper pole calculus. No hydronephrosis. Aortic Atherosclerosis (ICD10-I70.0). Electronically Signed   By: Anner Crete M.D.   On: 08/21/2019 21:31   Dg Abdomen 1 View  Result Date: 08/21/2019 CLINICAL DATA:  Constipation for 2 weeks.  Bloating. EXAM: ABDOMEN - 1 VIEW COMPARISON:  None. FINDINGS: Moderate stool is present throughout the colon. There is no obstruction. Small bowel gas pattern is within normal limits. Degenerative changes are present in the lumbar spine with scoliosis. Degenerative changes of the hips are worse on the right. Surgical clips are present at the gallbladder fossa. Heart is enlarged. IMPRESSION: 1. Moderate stool throughout the colon without obstruction. 2. Normal small gas pattern. 3. Degenerative changes of the lumbar spine and right hip. Electronically Signed   By: San Morelle M.D.   On: 08/21/2019 18:40     Imaging studies reviewed.  CT reviewed, no obvious acute findings, likely some chronic elements of muscular hypertrophy are noted.  Constipation and fecal impaction proximal to the sigmoid colon are noted. ____________________________________________   PROCEDURES  Procedure(s) performed: None  Procedures  Critical Care performed: No  ____________________________________________   INITIAL IMPRESSION / ASSESSMENT AND PLAN / ED COURSE  Pertinent labs & imaging results that were available during my care of the patient were reviewed by me and considered in my medical decision making (see chart for details).   Patient presents for evaluation of constipated feeling.  Clinical examination seems consistent with this concern as well as decreased bowel sounds.  Question if this could be related to her iron infusions or iron supplementation.  Lab work reassuring without obvious etiology to explain her symptoms, proceed with CT scan without contrast due to her reported severe allergy  which I did discuss with her and she confirms to iodinated materials.  Imaging study reviewed, she has close GI follow-up, does appear to have element of fecal impaction.  Ongoing care assigned to Dr. Quentin Cornwall, plan to reassess and trial enema to see if able to relieve patient's symptomatology.  Overall nontoxic, reassuring, and I anticipate likely outpatient care should she have good result after enema.      ____________________________________________   FINAL CLINICAL IMPRESSION(S) / ED DIAGNOSES  Final diagnoses:  Constipation, unspecified constipation type  Fecal impaction [facility])        Note:  This document was prepared using Dragon voice recognition software and may include unintentional dictation  errors       Delman Kitten, MD 08/21/19 2154

## 2019-08-21 NOTE — Discharge Instructions (Addendum)
You were seen in the emergency department today for constipation.  We recommend that you use one or more of the following over-the-counter medications in the order described: °  °1)  Colace (or Dulcolax) 100 mg:  This is a stool softener, and you may take it once or twice a day as needed. °2)  Senna tablets:  This is a bowel stimulant that will help "push" out your stool. It is the next step to add after you have tried a stool softener. °3)  Miralax (powder):  This medication works by drawing additional fluid into your intestines and helps to flush out your stool.  Mix the powder with water or juice according to label instructions.  It may help if the Colace and Senna are not sufficient, but you must be sure to use the recommended amount of water or juice when you mix up the powder. °Remember that narcotic pain medications are constipating, so avoid them or minimize their use.  Drink plenty of fluids. ° °Please return to the Emergency Department immediately if you develop new or worsening symptoms that concern you, such as (but not limited to) fever > 101 degrees, severe abdominal pain, or persistent vomiting. ° °

## 2019-08-27 ENCOUNTER — Telehealth: Payer: Self-pay

## 2019-08-27 ENCOUNTER — Telehealth: Payer: Self-pay | Admitting: *Deleted

## 2019-08-27 NOTE — Telephone Encounter (Signed)
  Please call patient.  CBC on 08/21/2019 was normal.  Suspect current ferritin level is good.  When is her next lab check?  M

## 2019-08-27 NOTE — Telephone Encounter (Signed)
spoke with the patient to inform her of her lab values and she was understanding and agreeable.

## 2019-08-27 NOTE — Telephone Encounter (Signed)
Patient called in to let Dr Mike Gip know that she did not take her Iron supplement for 1 week because she became severely constipated and has been back taking it for 3 days now and wants to know if she needs labs drawn to see if she needs another iron infusion, please advise.Marland KitchenMarland Kitchen

## 2019-09-09 DIAGNOSIS — I700 Atherosclerosis of aorta: Secondary | ICD-10-CM | POA: Insufficient documentation

## 2019-09-09 DIAGNOSIS — I2510 Atherosclerotic heart disease of native coronary artery without angina pectoris: Secondary | ICD-10-CM | POA: Insufficient documentation

## 2019-11-06 ENCOUNTER — Other Ambulatory Visit: Payer: Self-pay

## 2019-11-06 ENCOUNTER — Inpatient Hospital Stay: Payer: BC Managed Care – PPO | Attending: Hematology and Oncology

## 2019-11-06 DIAGNOSIS — D509 Iron deficiency anemia, unspecified: Secondary | ICD-10-CM | POA: Diagnosis not present

## 2019-11-06 LAB — CBC WITH DIFFERENTIAL/PLATELET
Abs Immature Granulocytes: 0.13 10*3/uL — ABNORMAL HIGH (ref 0.00–0.07)
Basophils Absolute: 0.1 10*3/uL (ref 0.0–0.1)
Basophils Relative: 1 %
Eosinophils Absolute: 0.3 10*3/uL (ref 0.0–0.5)
Eosinophils Relative: 3 %
HCT: 38.6 % (ref 36.0–46.0)
Hemoglobin: 12.1 g/dL (ref 12.0–15.0)
Immature Granulocytes: 2 %
Lymphocytes Relative: 21 %
Lymphs Abs: 1.6 10*3/uL (ref 0.7–4.0)
MCH: 28.3 pg (ref 26.0–34.0)
MCHC: 31.3 g/dL (ref 30.0–36.0)
MCV: 90.2 fL (ref 80.0–100.0)
Monocytes Absolute: 0.6 10*3/uL (ref 0.1–1.0)
Monocytes Relative: 8 %
Neutro Abs: 5.1 10*3/uL (ref 1.7–7.7)
Neutrophils Relative %: 65 %
Platelets: 307 10*3/uL (ref 150–400)
RBC: 4.28 MIL/uL (ref 3.87–5.11)
RDW: 16 % — ABNORMAL HIGH (ref 11.5–15.5)
WBC: 7.7 10*3/uL (ref 4.0–10.5)
nRBC: 0 % (ref 0.0–0.2)

## 2019-11-06 LAB — FERRITIN: Ferritin: 71 ng/mL (ref 11–307)

## 2019-12-30 NOTE — Progress Notes (Signed)
[facility]  [address], [address] [place], [place] [address] Phone: [phone]  Fax: [phone]   Clinic Day:  01/01/2020  Referring physician: [name], *  Chief Complaint: Andrea Zhang is a 59 y.o. female with iron deficiency anemia who is seen for 5 month assessment.  HPI: The patient was last seen in the hematology clinic on 08/07/2019. At that time, she felt "ok". Exam was stable. Hematocrit 33.1, hemoglobin 10.1, MCV 90.9, platelets 278,000, WBC 6,600.  Ferritin was 17.  I encouraged follow up with GI.  She continued on oral B12.   Patient received Feraheme on 08/07/2019 and 08/19/2019.   Labs followed: 11/06/2019: Hematocrit 38.6, hemoglobin 12.1, MCV 90.2, platelets 307,000, WBC 7,700. Ferritin 71.  12/31/2019: Hematocrit 36.8, hemoglobin 11.1, MCV 91.8, platelets 330,000, WBC 6,800. Ferritin 13.   During the interim, she has felt "ok".  She denies any black or bloody stool. She has no vaginal bleeding or hematuria. She is eating iron rich foods. She takes or oral iron. She is taking a multivitamin. All of her joints ache. She notes her restless legs are better with the increased dose of gabapentin.  She has a follow-up appointment with the [facility] in 02/2020.   Past Medical History:  Diagnosis Date  . Anemia    iron deficiency and b12 deficiency  . Anxiety   . Arthritis   . Asthma   . Diabetes mellitus without complication ([place])   . Fasciitis    left foot  . GERD (gastroesophageal reflux disease)   . History of kidney stones   . Hypercholesterolemia   . Hypertension   . Migraines    MIGRAINES HAVE IMPROVED SINCE RECEIVING IRON    Past Surgical History:  Procedure Laterality Date  . CHOLECYSTECTOMY  2004  . COLONOSCOPY WITH ESOPHAGOGASTRODUODENOSCOPY (EGD)  02/2018  . ESOPHAGOGASTRODUODENOSCOPY (EGD) WITH PROPOFOL N/A 06/06/2016   Procedure: ESOPHAGOGASTRODUODENOSCOPY (EGD) WITH PROPOFOL;  Surgeon: Manya Silvas, MD;  Location: [facility] ENDOSCOPY;  Service: Endoscopy;  Laterality: N/A;  . EXTRACORPOREAL SHOCK WAVE LITHOTRIPSY  2010  . JOINT REPLACEMENT  2013   LT TKR  . SAVORY DILATION N/A 06/06/2016   Procedure: SAVORY DILATION;  Surgeon: Manya Silvas, MD;  Location: [facility] ENDOSCOPY;  Service: Endoscopy;  Laterality: N/A;  . SAVORY DILATION  02/2018  . SHOULDER ARTHROSCOPY WITH OPEN ROTATOR CUFF REPAIR Right 05/24/2018   Procedure: right shoulder arthroscopy, extensive arthroscopic debridement, decompression, open rotator cuff repair, biceps tenodesis;  Surgeon: Corky Mull, MD;  Location: [facility] ORS;  Service: Orthopedics;  Laterality: Right;    Family History  Problem Relation Age of Onset  . COPD Mother   . Heart disease Mother   . Asthma Mother   . Schizophrenia Mother   . Hemophilia Father   . Prostate cancer Father   . Heart disease Father   . Epilepsy Sister   . Stroke Sister   . Alcohol abuse Sister   . Hypertension Sister   . Epilepsy Brother   . Lupus Daughter   . Gallbladder disease Daughter   . Breast cancer Neg Hx     Social History:  reports that she has never smoked. She has never used smokeless tobacco. She reports that she does not drink alcohol or use drugs.  She lives in [place].  The patient is alone today.  Allergies:  Allergies  Allergen Reactions  . Iodinated Diagnostic Agents Other (See Comments)    Becomes 'unresponsive' to ORAL and  IV DYE BETADINE ON THE SKIN IS OKAY unresponsive Patient could hear things but not responsive Becomes 'unresponsive' to ORAL and IV DYE BETADINE ON THE SKIN IS OKAY  Patient could hear things but not responsive Becomes 'unresponsive' to ORAL and IV DYE BETADINE ON THE SKIN IS OKAY Becomes 'unresponsive' to ORAL and IV DYE BETADINE ON THE SKIN IS OKAY unresponsive Patient could hear things but not responsiveBecomes 'unresponsive' to ORAL and IV DYE BETADINE ON THE SKIN IS OKAY  . Diclofenac Hives    HORRIBLE RASH  with both PATCH OR CREAM  . Cephalexin Rash  . Maxalt [Rizatriptan Benzoate] Other (See Comments)    'Heart Races'  . Orphenadrine Citrate Other (See Comments)    Patient unsure of this allergy.  Marland Kitchen Zithromax [Azithromycin] Other (See Comments)    Severe Abdominal Cramps  . Zomig [Zolmitriptan] Other (See Comments)    'Heart Races'    Current Medications: Current Outpatient Medications  Medication Sig Dispense Refill  . albuterol (PROVENTIL HFA;VENTOLIN HFA) 108 (90 Base) MCG/ACT inhaler Inhale 2 puffs into the lungs every 6 (six) hours as needed for wheezing or shortness of breath.    Marland Kitchen aspirin EC 81 MG tablet Take 81 mg by mouth daily.    Marland Kitchen atenolol (TENORMIN) 50 MG tablet Take 50 mg by mouth daily.    . cetirizine (ZYRTEC) 10 MG tablet Take 10 mg by mouth as needed for allergies.    . Cholecalciferol 2000 units CAPS Take 2,000 Units by mouth daily.    . cyclobenzaprine (FLEXERIL) 5 MG tablet Take 5 mg by mouth 3 (three) times daily as needed for muscle spasms.    . ferrous gluconate (FERGON) 324 MG tablet Take 324 mg by mouth daily with breakfast.    . gabapentin (NEURONTIN) 800 MG tablet Take 1 tablet by mouth daily.    . hydrochlorothiazide (HYDRODIURIL) 12.5 MG tablet Take 12.5 mg by mouth as needed (fluid).    . Melatonin 10 MG TABS Take 1 tablet by mouth at bedtime as needed.    . mirtazapine (REMERON) 15 MG tablet Take 15 mg by mouth at bedtime.     . naratriptan (AMERGE) 2.5 MG tablet Take 2.5 mg by mouth as needed for migraine. Take one (1) tablet at onset of headache; if returns or does not resolve, may repeat after 4 hours; do not exceed five (5) mg in 24 hours.    Marland Kitchen nystatin-triamcinolone (MYCOLOG II) cream Apply topically as needed.    Marland Kitchen omeprazole (PRILOSEC) 40 MG capsule Take 40 mg by mouth 2 (two) times daily.    . pioglitazone-metformin (ACTOPLUS MET) 15-850 MG tablet Take 1 tablet by mouth 2 (two) times daily with a meal.    . promethazine (PHENERGAN) 25 MG tablet  Take 25 mg by mouth every 6 (six) hours as needed for nausea or vomiting.    . simvastatin (ZOCOR) 10 MG tablet Take 10 mg by mouth daily.    . vitamin B-12 (CYANOCOBALAMIN) 1000 MCG tablet Take 1,000 mcg by mouth daily.     No current facility-administered medications for this visit.    Review of Systems  Constitutional: Negative.  Negative for chills, diaphoresis, fever, malaise/fatigue and weight loss (stable).       Doing ok.  HENT: Negative.  Negative for congestion, hearing loss, nosebleeds, sinus pain and sore throat.   Eyes: Negative.  Negative for blurred vision and double vision.  Respiratory: Negative.  Negative for cough, sputum production, shortness of breath and wheezing.  Cardiovascular: Negative.  Negative for chest pain, palpitations, orthopnea and leg swelling.  Gastrointestinal: Negative.  Negative for abdominal pain, blood in stool, constipation, diarrhea, heartburn, melena, nausea and vomiting.       No ice pica. Eating iron rich foods.  Genitourinary: Negative.  Negative for dysuria, frequency and urgency.  Musculoskeletal: Positive for joint pain (all joints). Negative for back pain and myalgias.  Skin: Negative.  Negative for rash.  Neurological: Positive for tremors (restless legs; on gabapentin). Negative for dizziness, tingling, sensory change, focal weakness, weakness and headaches.  Endo/Heme/Allergies: Positive for environmental allergies. Does not bruise/bleed easily.  Psychiatric/Behavioral: Negative.  Negative for depression and memory loss. The patient is not nervous/anxious and does not have insomnia.   All other systems reviewed and are negative.  Performance status (ECOG):  1  Vitals Blood pressure 124/85, pulse 99, temperature (!) 97 F (36.1 C), temperature source Tympanic, resp. rate 18, weight 244 lb 11.4 oz (111 kg), SpO2 100 %.   Physical Exam  Constitutional: She is oriented to person, place, and time. She appears well-developed and  well-nourished. No distress.  HENT:  Head: Normocephalic and atraumatic.  Mouth/Throat: Oropharynx is clear and moist. No oropharyngeal exudate.  Shoulder length blonde/graying hair.  Mask.  Eyes: Pupils are equal, round, and reactive to light. Conjunctivae and EOM are normal. No scleral icterus.  Glasses.  Neck: No JVD present.  Cardiovascular: Normal rate, regular rhythm and normal heart sounds. Exam reveals no gallop and no friction rub.  No murmur heard. Pulmonary/Chest: Effort normal and breath sounds normal. She has no wheezes. She has no rales.  Abdominal: Soft. Bowel sounds are normal. She exhibits no distension and no mass. There is no abdominal tenderness. There is no rebound and no guarding.  Musculoskeletal:        General: Normal range of motion.     Cervical back: Normal range of motion and neck supple.  Lymphadenopathy:       Head (right side): No preauricular, no posterior auricular and no occipital adenopathy present.       Head (left side): No preauricular, no posterior auricular and no occipital adenopathy present.    She has no cervical adenopathy.    She has no axillary adenopathy.       Right: No inguinal and no supraclavicular adenopathy present.       Left: No inguinal and no supraclavicular adenopathy present.  Neurological: She is alert and oriented to person, place, and time.  Skin: Skin is warm and dry. No rash noted. She is not diaphoretic. No erythema. No pallor.  Psychiatric: She has a normal mood and affect. Her behavior is normal. Judgment and thought content normal.  Nursing note and vitals reviewed.   Appointment on 12/31/2019  Component Date Value Ref Range Status  . Ferritin 12/31/2019 13  11 - 307 ng/mL Final   Performed at [facility], [address] [place], [place] [address]  . WBC 12/31/2019 6.8  4.0 - 10.5 K/uL Final  . RBC 12/31/2019 4.01  3.87 - 5.11 MIL/uL Final  . Hemoglobin 12/31/2019 11.1* 12.0 - 15.0 g/dL Final  . HCT  12/31/2019 36.8  36.0 - 46.0 % Final  . MCV 12/31/2019 91.8  80.0 - 100.0 fL Final  . MCH 12/31/2019 27.7  26.0 - 34.0 pg Final  . MCHC 12/31/2019 30.2  30.0 - 36.0 g/dL Final  . RDW 12/31/2019 14.7  11.5 - 15.5 % Final  . Platelets 12/31/2019 330  150 -  400 K/uL Final  . nRBC 12/31/2019 0.0  0.0 - 0.2 % Final  . Neutrophils Relative % 12/31/2019 73  % Final  . Neutro Abs 12/31/2019 5.0  1.7 - 7.7 K/uL Final  . Lymphocytes Relative 12/31/2019 16  % Final  . Lymphs Abs 12/31/2019 1.1  0.7 - 4.0 K/uL Final  . Monocytes Relative 12/31/2019 7  % Final  . Monocytes Absolute 12/31/2019 0.5  0.1 - 1.0 K/uL Final  . Eosinophils Relative 12/31/2019 3  % Final  . Eosinophils Absolute 12/31/2019 0.2  0.0 - 0.5 K/uL Final  . Basophils Relative 12/31/2019 0  % Final  . Basophils Absolute 12/31/2019 0.0  0.0 - 0.1 K/uL Final  . Immature Granulocytes 12/31/2019 1  % Final  . Abs Immature Granulocytes 12/31/2019 0.09* 0.00 - 0.07 K/uL Final   Performed at [facility], [address] [place], [place] [address]    Assessment:  Andrea Zhang is a 59 y.o. female with recurrent iron deficiency anemia(unclear etiology) and B12 deficiency. Dietis good. She has taken oral irondaily for a year.  Work-up on03/11/2019revealed a hematocrit of 24, hemoglobin 7.5, MCV 78.2, platelets 299,000, white count 7400. Ferritinwas 7. TIBC was560(high). Normal studies included: haptoglobin, celiac disease panel, myeloma panel, folate (7.6), urinalysis (no hematuria), and H pylori antigen. B12was 210. Reticulocyte count was 7.5%. TSHwas 0.918 on 01/11/2019.Urinalysis on 02/04/2019 revealed no hematuria.  She has received Feraheme: 510 mg on 01/15/2018, 01/22/2018, 03/19/2018, 03/26/2018, 09/13/2018, 09/27/2018, 10/26/2018, 11/02/2018, 02/04/2019, 04/11/2019, 08/07/2019 and 08/19/2019.  Ferritinhas been followed: 7 on03/09/2018, 12 on05/04/2018, 165 on06/13/2019, 39 on08/03/2018, 15 on  09/10/2018, 60 on 10/24/2018, 96 on02/01/2019,22 on 01/31/2019, 62 on 03/01/2019, 21 on 04/02/2019, 108 on 05/08/2019, 17 on 08/06/2019, 71 on 11/06/2019, and 13 on 12/31/2019.  She has B12 deficiency. She has received B12 injections(last08/03/2018).[id number] on11/04/2017, 210 on 01/15/2018, 1727 on 06/11/2018, and 1467 on 09/10/2018.She is on oral B12.   She has folate deficiency.Folatewas5.3 on 04/24 /2020 and 25.0 on 04/02/2019.She began oral folateon 03/04/2019.  Upper endoscopyon 07/31/2017revealed a Schatzki's ring which was dilated. There was evidence of gastritis. Duodenum was normal. She has had2-3 EGDs in the past for strictures requiring dilatation. Colonoscopyin 2011 was normal. [facility] and [facility] 03/2018 [facility] was "normal"(no report available). She states that she had a capsule studyin 11/2018 whichrevealed"erosive gastritis"(no report available).  Symptomatically, she denies any bleeding.  Exam is stable.  Plan: 1. Review labs from02/23/2021. 2.Iron deficiency anemia Hematocrit 40.3.  Hemoglobin 12.7.  MCV 91.2 on 05/08/2019.   Ferritin 108              Hematocrit 33.1.  Hemoglobin 10.1.  MCV 90.9 on 08/06/2019.    Ferritin 17.  Hematocrit 40.6.  Hemoglobin 12.6.  MCV 89.2 on 08/21/2019.   Hematocrit 38.6.  Hemoglobin 12.1.  MCV 90.2 on 11/06/2019.   Ferritin 71  Hematocrit 36.8.  Hemoglobin 11.1.  MCV 91.8 on 12/31/2019.    Ferritin 13.             She received Feraheme on 08/07/2019 and 08/19/2019.             Etiology of persistent iron deficiencypossibly related to erosive gastritis.                         GI recommended IV iron and PPI. Shehas received multiple infusions of IV iron.   Discuss follow-up with GI. Urinalysison 02/04/2019 revealed no hematuria. She received Feraheme if her ferritin is <  30.  Feraheme today and in 1 week. 3.B12  deficiency Sheis onoral B12. Folate was25.0on 04/02/2019. B12 levelwas 8315 on 09/10/2018.             Continue to monitor. 4.   RTC in 3 months for labs (CBC with diff, ferritin, B12, folate). 5.   RTC in 6 months for MD assessment, labs (CBC with diff, ferritin- day before) and +/- Feraheme.  I discussed the assessment and treatment plan with the patient.  The patient was provided an opportunity to ask questions and all were answered.  The patient agreed with the plan and demonstrated an understanding of the instructions.  The patient was advised to call back if the symptoms worsen or if the condition fails to improve as anticipated.   Lequita Asal, MD, PhD    01/01/2020, 2:03 PM  I, Selena Batten, am acting as scribe for [employer]. Mike Gip, MD, PhD.  I, Joselito Fieldhouse C. Mike Gip, MD, have reviewed the above documentation for accuracy and completeness, and I agree with the above.

## 2019-12-31 ENCOUNTER — Other Ambulatory Visit: Payer: Self-pay

## 2019-12-31 ENCOUNTER — Inpatient Hospital Stay: Payer: BC Managed Care – PPO | Attending: Hematology and Oncology

## 2019-12-31 DIAGNOSIS — I10 Essential (primary) hypertension: Secondary | ICD-10-CM | POA: Insufficient documentation

## 2019-12-31 DIAGNOSIS — Z881 Allergy status to other antibiotic agents status: Secondary | ICD-10-CM | POA: Insufficient documentation

## 2019-12-31 DIAGNOSIS — Z79899 Other long term (current) drug therapy: Secondary | ICD-10-CM | POA: Insufficient documentation

## 2019-12-31 DIAGNOSIS — E538 Deficiency of other specified B group vitamins: Secondary | ICD-10-CM | POA: Diagnosis not present

## 2019-12-31 DIAGNOSIS — E7800 Pure hypercholesterolemia, unspecified: Secondary | ICD-10-CM | POA: Insufficient documentation

## 2019-12-31 DIAGNOSIS — Z836 Family history of other diseases of the respiratory system: Secondary | ICD-10-CM | POA: Insufficient documentation

## 2019-12-31 DIAGNOSIS — Z87442 Personal history of urinary calculi: Secondary | ICD-10-CM | POA: Insufficient documentation

## 2019-12-31 DIAGNOSIS — Z8249 Family history of ischemic heart disease and other diseases of the circulatory system: Secondary | ICD-10-CM | POA: Diagnosis not present

## 2019-12-31 DIAGNOSIS — M1990 Unspecified osteoarthritis, unspecified site: Secondary | ICD-10-CM | POA: Diagnosis not present

## 2019-12-31 DIAGNOSIS — E119 Type 2 diabetes mellitus without complications: Secondary | ICD-10-CM | POA: Insufficient documentation

## 2019-12-31 DIAGNOSIS — D509 Iron deficiency anemia, unspecified: Secondary | ICD-10-CM | POA: Diagnosis not present

## 2019-12-31 DIAGNOSIS — J45909 Unspecified asthma, uncomplicated: Secondary | ICD-10-CM | POA: Insufficient documentation

## 2019-12-31 DIAGNOSIS — Z820 Family history of epilepsy and other diseases of the nervous system: Secondary | ICD-10-CM | POA: Insufficient documentation

## 2019-12-31 DIAGNOSIS — Z811 Family history of alcohol abuse and dependence: Secondary | ICD-10-CM | POA: Diagnosis not present

## 2019-12-31 DIAGNOSIS — Z823 Family history of stroke: Secondary | ICD-10-CM | POA: Insufficient documentation

## 2019-12-31 DIAGNOSIS — Z8042 Family history of malignant neoplasm of prostate: Secondary | ICD-10-CM | POA: Insufficient documentation

## 2019-12-31 DIAGNOSIS — K219 Gastro-esophageal reflux disease without esophagitis: Secondary | ICD-10-CM | POA: Insufficient documentation

## 2019-12-31 LAB — CBC WITH DIFFERENTIAL/PLATELET
Abs Immature Granulocytes: 0.09 10*3/uL — ABNORMAL HIGH (ref 0.00–0.07)
Basophils Absolute: 0 10*3/uL (ref 0.0–0.1)
Basophils Relative: 0 %
Eosinophils Absolute: 0.2 10*3/uL (ref 0.0–0.5)
Eosinophils Relative: 3 %
HCT: 36.8 % (ref 36.0–46.0)
Hemoglobin: 11.1 g/dL — ABNORMAL LOW (ref 12.0–15.0)
Immature Granulocytes: 1 %
Lymphocytes Relative: 16 %
Lymphs Abs: 1.1 10*3/uL (ref 0.7–4.0)
MCH: 27.7 pg (ref 26.0–34.0)
MCHC: 30.2 g/dL (ref 30.0–36.0)
MCV: 91.8 fL (ref 80.0–100.0)
Monocytes Absolute: 0.5 10*3/uL (ref 0.1–1.0)
Monocytes Relative: 7 %
Neutro Abs: 5 10*3/uL (ref 1.7–7.7)
Neutrophils Relative %: 73 %
Platelets: 330 10*3/uL (ref 150–400)
RBC: 4.01 MIL/uL (ref 3.87–5.11)
RDW: 14.7 % (ref 11.5–15.5)
WBC: 6.8 10*3/uL (ref 4.0–10.5)
nRBC: 0 % (ref 0.0–0.2)

## 2019-12-31 LAB — FERRITIN: Ferritin: 13 ng/mL (ref 11–307)

## 2019-12-31 NOTE — Progress Notes (Signed)
Confirmed Name and DOB. Denies any concerns.  

## 2020-01-01 ENCOUNTER — Inpatient Hospital Stay: Payer: BC Managed Care – PPO

## 2020-01-01 ENCOUNTER — Inpatient Hospital Stay (HOSPITAL_BASED_OUTPATIENT_CLINIC_OR_DEPARTMENT_OTHER): Payer: BC Managed Care – PPO | Admitting: Hematology and Oncology

## 2020-01-01 ENCOUNTER — Encounter: Payer: Self-pay | Admitting: Hematology and Oncology

## 2020-01-01 VITALS — BP 99/63 | HR 84 | Temp 96.6°F | Resp 18

## 2020-01-01 VITALS — BP 124/85 | HR 99 | Temp 97.0°F | Resp 18 | Wt 244.7 lb

## 2020-01-01 DIAGNOSIS — E538 Deficiency of other specified B group vitamins: Secondary | ICD-10-CM | POA: Diagnosis not present

## 2020-01-01 DIAGNOSIS — D509 Iron deficiency anemia, unspecified: Secondary | ICD-10-CM

## 2020-01-01 MED ORDER — SODIUM CHLORIDE 0.9 % IV SOLN
510.0000 mg | Freq: Once | INTRAVENOUS | Status: AC
  Administered 2020-01-01: 15:00:00 510 mg via INTRAVENOUS
  Filled 2020-01-01: qty 17

## 2020-01-01 MED ORDER — SODIUM CHLORIDE 0.9 % IV SOLN
Freq: Once | INTRAVENOUS | Status: AC
  Filled 2020-01-01: qty 250

## 2020-01-01 NOTE — Patient Instructions (Signed)

## 2020-01-09 ENCOUNTER — Other Ambulatory Visit: Payer: Self-pay

## 2020-01-09 ENCOUNTER — Inpatient Hospital Stay: Payer: BC Managed Care – PPO | Attending: Hematology and Oncology

## 2020-01-09 VITALS — BP 106/71 | HR 78 | Temp 96.2°F | Resp 18

## 2020-01-09 DIAGNOSIS — E119 Type 2 diabetes mellitus without complications: Secondary | ICD-10-CM | POA: Diagnosis not present

## 2020-01-09 DIAGNOSIS — D509 Iron deficiency anemia, unspecified: Secondary | ICD-10-CM | POA: Diagnosis present

## 2020-01-09 DIAGNOSIS — I10 Essential (primary) hypertension: Secondary | ICD-10-CM | POA: Insufficient documentation

## 2020-01-09 DIAGNOSIS — Z811 Family history of alcohol abuse and dependence: Secondary | ICD-10-CM | POA: Insufficient documentation

## 2020-01-09 DIAGNOSIS — Z836 Family history of other diseases of the respiratory system: Secondary | ICD-10-CM | POA: Insufficient documentation

## 2020-01-09 DIAGNOSIS — Z8249 Family history of ischemic heart disease and other diseases of the circulatory system: Secondary | ICD-10-CM | POA: Diagnosis not present

## 2020-01-09 DIAGNOSIS — Z79899 Other long term (current) drug therapy: Secondary | ICD-10-CM | POA: Insufficient documentation

## 2020-01-09 DIAGNOSIS — Z818 Family history of other mental and behavioral disorders: Secondary | ICD-10-CM | POA: Insufficient documentation

## 2020-01-09 DIAGNOSIS — M2550 Pain in unspecified joint: Secondary | ICD-10-CM | POA: Insufficient documentation

## 2020-01-09 DIAGNOSIS — Z87442 Personal history of urinary calculi: Secondary | ICD-10-CM | POA: Diagnosis not present

## 2020-01-09 DIAGNOSIS — Z8379 Family history of other diseases of the digestive system: Secondary | ICD-10-CM | POA: Insufficient documentation

## 2020-01-09 DIAGNOSIS — E538 Deficiency of other specified B group vitamins: Secondary | ICD-10-CM | POA: Insufficient documentation

## 2020-01-09 DIAGNOSIS — Z823 Family history of stroke: Secondary | ICD-10-CM | POA: Insufficient documentation

## 2020-01-09 DIAGNOSIS — Z8042 Family history of malignant neoplasm of prostate: Secondary | ICD-10-CM | POA: Diagnosis not present

## 2020-01-09 DIAGNOSIS — K219 Gastro-esophageal reflux disease without esophagitis: Secondary | ICD-10-CM | POA: Insufficient documentation

## 2020-01-09 MED ORDER — SODIUM CHLORIDE 0.9 % IV SOLN
510.0000 mg | Freq: Once | INTRAVENOUS | Status: AC
  Administered 2020-01-09: 510 mg via INTRAVENOUS
  Filled 2020-01-09: qty 17

## 2020-01-09 MED ORDER — SODIUM CHLORIDE 0.9 % IV SOLN
Freq: Once | INTRAVENOUS | Status: AC
  Filled 2020-01-09: qty 250

## 2020-01-09 NOTE — Patient Instructions (Signed)

## 2020-02-02 ENCOUNTER — Ambulatory Visit: Payer: BC Managed Care – PPO | Attending: Internal Medicine

## 2020-02-02 DIAGNOSIS — Z23 Encounter for immunization: Secondary | ICD-10-CM

## 2020-02-02 NOTE — Progress Notes (Signed)
Covid-19 Vaccination Clinic  Name:  Andrea Zhang    MRN: [id number] DOB: 1961-02-03  02/02/2020  Ms. Kung was observed post Covid-19 immunization for 15 minutes without incident. She was provided with Vaccine Information Sheet and instruction to access the V-Safe system.   Ms. Tessendorf was instructed to call 911 with any severe reactions post vaccine: Marland Kitchen Difficulty breathing  . Swelling of face and throat  . A fast heartbeat  . A bad rash all over body  . Dizziness and weakness   Immunizations Administered    Name Date Dose VIS Date Route   Pfizer COVID-19 Vaccine 02/02/2020 10:17 AM 0.3 mL 10/18/2019 Intramuscular   Manufacturer: [employer], [employer]   Lot: [id number]   NDC: [address]-4

## 2020-02-26 ENCOUNTER — Ambulatory Visit: Payer: BC Managed Care – PPO | Attending: Internal Medicine

## 2020-02-26 DIAGNOSIS — Z23 Encounter for immunization: Secondary | ICD-10-CM

## 2020-02-26 NOTE — Progress Notes (Signed)
Covid-19 Vaccination Clinic  Name:  Andrea Zhang    MRN: [id number] DOB: [birth year]  02/26/2020  [name] was observed post Covid-19 immunization for 15 minutes without incident. She was provided with Vaccine Information Sheet and instruction to access the V-Safe system.   [name] was instructed to call 911 with any severe reactions post vaccine: Marland Kitchen Difficulty breathing  . Swelling of face and throat  . A fast heartbeat  . A bad rash all over body  . Dizziness and weakness   Immunizations Administered    Name Date Dose VIS Date Route   Pfizer COVID-19 Vaccine 02/26/2020  9:29 AM 0.3 mL 01/01/2019 Intramuscular   Manufacturer: [employer], [employer]   Lot: [id number]   NDC: [address]-6

## 2020-03-31 ENCOUNTER — Other Ambulatory Visit: Payer: Self-pay

## 2020-03-31 ENCOUNTER — Inpatient Hospital Stay: Payer: BC Managed Care – PPO | Attending: Hematology and Oncology

## 2020-03-31 DIAGNOSIS — D509 Iron deficiency anemia, unspecified: Secondary | ICD-10-CM | POA: Diagnosis present

## 2020-03-31 DIAGNOSIS — E538 Deficiency of other specified B group vitamins: Secondary | ICD-10-CM

## 2020-03-31 LAB — CBC WITH DIFFERENTIAL/PLATELET
Abs Immature Granulocytes: 0.09 10*3/uL — ABNORMAL HIGH (ref 0.00–0.07)
Basophils Absolute: 0 10*3/uL (ref 0.0–0.1)
Basophils Relative: 1 %
Eosinophils Absolute: 0.2 10*3/uL (ref 0.0–0.5)
Eosinophils Relative: 3 %
HCT: 39 % (ref 36.0–46.0)
Hemoglobin: 12 g/dL (ref 12.0–15.0)
Immature Granulocytes: 1 %
Lymphocytes Relative: 19 %
Lymphs Abs: 1.5 10*3/uL (ref 0.7–4.0)
MCH: 27 pg (ref 26.0–34.0)
MCHC: 30.8 g/dL (ref 30.0–36.0)
MCV: 87.8 fL (ref 80.0–100.0)
Monocytes Absolute: 0.8 10*3/uL (ref 0.1–1.0)
Monocytes Relative: 10 %
Neutro Abs: 5.4 10*3/uL (ref 1.7–7.7)
Neutrophils Relative %: 66 %
Platelets: 355 10*3/uL (ref 150–400)
RBC: 4.44 MIL/uL (ref 3.87–5.11)
RDW: 15 % (ref 11.5–15.5)
WBC: 8 10*3/uL (ref 4.0–10.5)
nRBC: 0 % (ref 0.0–0.2)

## 2020-03-31 LAB — FOLATE: Folate: 21.2 ng/mL (ref >5.9)

## 2020-03-31 LAB — FERRITIN: Ferritin: 19 ng/mL (ref 11–307)

## 2020-04-01 LAB — VITAMIN B12: Vitamin B-12: 495 pg/mL (ref 180–914)

## 2020-04-09 ENCOUNTER — Inpatient Hospital Stay: Payer: BC Managed Care – PPO | Attending: Hematology and Oncology

## 2020-04-09 ENCOUNTER — Other Ambulatory Visit: Payer: Self-pay

## 2020-04-09 VITALS — BP 104/70 | HR 76 | Temp 96.3°F | Resp 18

## 2020-04-09 DIAGNOSIS — R251 Tremor, unspecified: Secondary | ICD-10-CM | POA: Diagnosis not present

## 2020-04-09 DIAGNOSIS — Z8349 Family history of other endocrine, nutritional and metabolic diseases: Secondary | ICD-10-CM | POA: Insufficient documentation

## 2020-04-09 DIAGNOSIS — Z79899 Other long term (current) drug therapy: Secondary | ICD-10-CM | POA: Diagnosis not present

## 2020-04-09 DIAGNOSIS — Z820 Family history of epilepsy and other diseases of the nervous system: Secondary | ICD-10-CM | POA: Diagnosis not present

## 2020-04-09 DIAGNOSIS — Z8379 Family history of other diseases of the digestive system: Secondary | ICD-10-CM | POA: Diagnosis not present

## 2020-04-09 DIAGNOSIS — D509 Iron deficiency anemia, unspecified: Secondary | ICD-10-CM | POA: Diagnosis present

## 2020-04-09 DIAGNOSIS — E538 Deficiency of other specified B group vitamins: Secondary | ICD-10-CM | POA: Diagnosis not present

## 2020-04-09 DIAGNOSIS — Z836 Family history of other diseases of the respiratory system: Secondary | ICD-10-CM | POA: Diagnosis not present

## 2020-04-09 DIAGNOSIS — M2550 Pain in unspecified joint: Secondary | ICD-10-CM | POA: Insufficient documentation

## 2020-04-09 DIAGNOSIS — Z811 Family history of alcohol abuse and dependence: Secondary | ICD-10-CM | POA: Insufficient documentation

## 2020-04-09 DIAGNOSIS — Z823 Family history of stroke: Secondary | ICD-10-CM | POA: Insufficient documentation

## 2020-04-09 DIAGNOSIS — Z818 Family history of other mental and behavioral disorders: Secondary | ICD-10-CM | POA: Diagnosis not present

## 2020-04-09 MED ORDER — SODIUM CHLORIDE 0.9 % IV SOLN
510.0000 mg | Freq: Once | INTRAVENOUS | Status: AC
  Administered 2020-04-09: 510 mg via INTRAVENOUS
  Filled 2020-04-09: qty 17

## 2020-04-09 MED ORDER — SODIUM CHLORIDE 0.9 % IV SOLN
Freq: Once | INTRAVENOUS | Status: AC
  Filled 2020-04-09: qty 250

## 2020-04-09 NOTE — Progress Notes (Signed)
Pt declines 30 minute post observation, pt has received Feraheme with no adverse reactions in the past. Pt tolerated infusion well. No s/s of distress or reaction noted. Pt and VS stable at discharge.

## 2020-06-10 ENCOUNTER — Other Ambulatory Visit: Payer: Self-pay | Admitting: Nurse Practitioner

## 2020-06-10 DIAGNOSIS — Z1231 Encounter for screening mammogram for malignant neoplasm of breast: Secondary | ICD-10-CM

## 2020-06-18 ENCOUNTER — Ambulatory Visit
Admission: RE | Admit: 2020-06-18 | Discharge: 2020-06-18 | Disposition: A | Discharge status: Home / Self Care | Payer: BC Managed Care – PPO | Source: Ambulatory Visit | Attending: Nurse Practitioner | Admitting: Nurse Practitioner

## 2020-06-18 ENCOUNTER — Other Ambulatory Visit: Payer: Self-pay

## 2020-06-18 DIAGNOSIS — Z1231 Encounter for screening mammogram for malignant neoplasm of breast: Secondary | ICD-10-CM | POA: Diagnosis not present

## 2020-06-30 ENCOUNTER — Telehealth: Payer: Self-pay

## 2020-06-30 ENCOUNTER — Inpatient Hospital Stay: Payer: BC Managed Care – PPO | Attending: Hematology and Oncology

## 2020-06-30 ENCOUNTER — Other Ambulatory Visit: Payer: Self-pay

## 2020-06-30 DIAGNOSIS — D509 Iron deficiency anemia, unspecified: Secondary | ICD-10-CM | POA: Insufficient documentation

## 2020-06-30 DIAGNOSIS — Z820 Family history of epilepsy and other diseases of the nervous system: Secondary | ICD-10-CM | POA: Diagnosis not present

## 2020-06-30 DIAGNOSIS — Z836 Family history of other diseases of the respiratory system: Secondary | ICD-10-CM | POA: Insufficient documentation

## 2020-06-30 DIAGNOSIS — Z811 Family history of alcohol abuse and dependence: Secondary | ICD-10-CM | POA: Diagnosis not present

## 2020-06-30 DIAGNOSIS — E538 Deficiency of other specified B group vitamins: Secondary | ICD-10-CM | POA: Insufficient documentation

## 2020-06-30 DIAGNOSIS — M2550 Pain in unspecified joint: Secondary | ICD-10-CM | POA: Insufficient documentation

## 2020-06-30 DIAGNOSIS — Z818 Family history of other mental and behavioral disorders: Secondary | ICD-10-CM | POA: Insufficient documentation

## 2020-06-30 DIAGNOSIS — Z79899 Other long term (current) drug therapy: Secondary | ICD-10-CM | POA: Insufficient documentation

## 2020-06-30 DIAGNOSIS — Z8042 Family history of malignant neoplasm of prostate: Secondary | ICD-10-CM | POA: Insufficient documentation

## 2020-06-30 DIAGNOSIS — Z823 Family history of stroke: Secondary | ICD-10-CM | POA: Diagnosis not present

## 2020-06-30 DIAGNOSIS — Z8249 Family history of ischemic heart disease and other diseases of the circulatory system: Secondary | ICD-10-CM | POA: Diagnosis not present

## 2020-06-30 LAB — CBC WITH DIFFERENTIAL/PLATELET
Abs Immature Granulocytes: 0.11 10*3/uL — ABNORMAL HIGH (ref 0.00–0.07)
Basophils Absolute: 0 10*3/uL (ref 0.0–0.1)
Basophils Relative: 0 %
Eosinophils Absolute: 0.2 10*3/uL (ref 0.0–0.5)
Eosinophils Relative: 2 %
HCT: 36.1 % (ref 36.0–46.0)
Hemoglobin: 11 g/dL — ABNORMAL LOW (ref 12.0–15.0)
Immature Granulocytes: 1 %
Lymphocytes Relative: 17 %
Lymphs Abs: 1.4 10*3/uL (ref 0.7–4.0)
MCH: 25.9 pg — ABNORMAL LOW (ref 26.0–34.0)
MCHC: 30.5 g/dL (ref 30.0–36.0)
MCV: 84.9 fL (ref 80.0–100.0)
Monocytes Absolute: 0.8 10*3/uL (ref 0.1–1.0)
Monocytes Relative: 9 %
Neutro Abs: 5.7 10*3/uL (ref 1.7–7.7)
Neutrophils Relative %: 71 %
Platelets: 425 10*3/uL — ABNORMAL HIGH (ref 150–400)
RBC: 4.25 MIL/uL (ref 3.87–5.11)
RDW: 15.2 % (ref 11.5–15.5)
WBC: 8.2 10*3/uL (ref 4.0–10.5)
nRBC: 0 % (ref 0.0–0.2)

## 2020-06-30 LAB — FERRITIN: Ferritin: 10 ng/mL — ABNORMAL LOW (ref 11–307)

## 2020-06-30 NOTE — Telephone Encounter (Signed)
-----   Message from Rosey Bath, MD sent at 06/30/2020  3:47 PM EDT ----- Regarding: Please call patient  Ferritin 10.  Please schedule Feraheme x1.  M ----- Message ----- From: Leory Plowman, Lab [facility] [place] Sent: 06/30/2020  11:20 AM EDT To: Rosey Bath, MD

## 2020-06-30 NOTE — Progress Notes (Signed)
[facility]  [address] [place], [place] [address] Phone: [phone]  Fax: [phone]   Clinic Day:  07/01/2020  Referring physician: [name], *  Chief Complaint: Andrea Zhang is a 59 y.o. female with iron deficiency anemia who is seen for 6 month assessment.  HPI: The patient was last seen in the hematology clinic on 01/01/2020. At that time, she denied any bleeding.  Exam was stable. Hematocrit was 36.8, hemoglobin 11.1, platelets 330,000, WBC 6,800. Ferritin was 13.  Screening mammogram on 06/18/2020 revealed no evidence of malignancy.  Labs followed: 02/11/2020: Hematocrit 39.7, hemoglobin 12.4, platelets 328,000, WBC 9,900. Ferritin 95. Iron saturation 13% with a TIBC 359.2. 03/31/2020: Hematocrit 39.0, hemoglobin 12.0, platelets 355,000, WBC 8,000. Ferritin 19. Vitamin B12 495. Folate 21.2.  The patient received Feraheme on 01/01/2020, 01/09/2020, and 04/09/2020.  During the interim, she has been "ok". She feels weak and has low energy. She reports ringing in her ears. Her restless legs and joint pain are stable. The patient has been taking oral iron with Vitamin C once daily for a few years. She denies ice pica, blood in her stool, and black stools. She eats iron rich foods.   Past Medical History:  Diagnosis Date  . Anemia    iron deficiency and b12 deficiency  . Anxiety   . Arthritis   . Asthma   . Diabetes mellitus without complication ([place])   . Fasciitis    left foot  . GERD (gastroesophageal reflux disease)   . History of kidney stones   . Hypercholesterolemia   . Hypertension   . Migraines    MIGRAINES HAVE IMPROVED SINCE RECEIVING IRON    Past Surgical History:  Procedure Laterality Date  . CHOLECYSTECTOMY  2004  . COLONOSCOPY WITH ESOPHAGOGASTRODUODENOSCOPY (EGD)  02/2018  . ESOPHAGOGASTRODUODENOSCOPY (EGD) WITH PROPOFOL N/A 06/06/2016   Procedure: ESOPHAGOGASTRODUODENOSCOPY (EGD) WITH PROPOFOL;   Surgeon: Manya Silvas, MD;  Location: [facility] ENDOSCOPY;  Service: Endoscopy;  Laterality: N/A;  . EXTRACORPOREAL SHOCK WAVE LITHOTRIPSY  2010  . JOINT REPLACEMENT  2013   LT TKR  . SAVORY DILATION N/A 06/06/2016   Procedure: SAVORY DILATION;  Surgeon: Manya Silvas, MD;  Location[facility] ENDOSCOPY;  Service: Endoscopy;  Laterality: N/A;  . SAVORY DILATION  02/2018  . SHOULDER ARTHROSCOPY WITH OPEN ROTATOR CUFF REPAIR Right 05/24/2018   Procedure: right shoulder arthroscopy, extensive arthroscopic debridement, decompression, open rotator cuff repair, biceps tenodesis;  Surgeon: Corky Mull, MD;  Location: [facility] ORS;  Service: Orthopedics;  Laterality: Right;    Family History  Problem Relation Age of Onset  . COPD Mother   . Heart disease Mother   . Asthma Mother   . Schizophrenia Mother   . Hemophilia Father   . Prostate cancer Father   . Heart disease Father   . Epilepsy Sister   . Stroke Sister   . Alcohol abuse Sister   . Hypertension Sister   . Epilepsy Brother   . Lupus Daughter   . Gallbladder disease Daughter   . Breast cancer Neg Hx     Social History:  reports that she has never smoked. She has never used smokeless tobacco. She reports that she does not drink alcohol and does not use drugs.  She lives in [place].  The patient is alone today.  Allergies:  Allergies  Allergen Reactions  . Iodinated Diagnostic Agents Other (See Comments)    Becomes 'unresponsive' to ORAL and IV DYE BETADINE ON  THE SKIN IS OKAY unresponsive Patient could hear things but not responsive Becomes 'unresponsive' to ORAL and IV DYE BETADINE ON THE SKIN IS OKAY  Patient could hear things but not responsive Becomes 'unresponsive' to ORAL and IV DYE BETADINE ON THE SKIN IS OKAY Becomes 'unresponsive' to ORAL and IV DYE BETADINE ON THE SKIN IS OKAY unresponsive Patient could hear things but not responsiveBecomes 'unresponsive' to ORAL and IV DYE BETADINE ON THE SKIN IS OKAY  .  Diclofenac Hives    HORRIBLE RASH with both PATCH OR CREAM  . Cephalexin Rash  . Maxalt [Rizatriptan Benzoate] Other (See Comments)    'Heart Races'  . Orphenadrine Citrate Other (See Comments)    Patient unsure of this allergy.  Marland Kitchen Zithromax [Azithromycin] Other (See Comments)    Severe Abdominal Cramps  . Zomig [Zolmitriptan] Other (See Comments)    'Heart Races'    Current Medications: Current Outpatient Medications  Medication Sig Dispense Refill  . albuterol (PROVENTIL HFA;VENTOLIN HFA) 108 (90 Base) MCG/ACT inhaler Inhale 2 puffs into the lungs every 6 (six) hours as needed for wheezing or shortness of breath.    Marland Kitchen aspirin EC 81 MG tablet Take 81 mg by mouth daily.    Marland Kitchen atenolol (TENORMIN) 50 MG tablet Take 50 mg by mouth daily.    . cetirizine (ZYRTEC) 10 MG tablet Take 10 mg by mouth as needed for allergies.    . Cholecalciferol 2000 units CAPS Take 2,000 Units by mouth daily.    . cyclobenzaprine (FLEXERIL) 5 MG tablet Take 5 mg by mouth 3 (three) times daily as needed for muscle spasms.    . ferrous gluconate (FERGON) 324 MG tablet Take 324 mg by mouth daily with breakfast.    . gabapentin (NEURONTIN) 800 MG tablet Take 1 tablet by mouth daily.    . hydrochlorothiazide (HYDRODIURIL) 12.5 MG tablet Take 12.5 mg by mouth as needed (fluid).    . Melatonin 10 MG TABS Take 1 tablet by mouth at bedtime as needed.    . mirtazapine (REMERON) 15 MG tablet Take 15 mg by mouth at bedtime.     Marland Kitchen nystatin-triamcinolone (MYCOLOG II) cream Apply topically as needed.    Marland Kitchen omeprazole (PRILOSEC) 40 MG capsule Take 40 mg by mouth 2 (two) times daily.    . pioglitazone-metformin (ACTOPLUS MET) 15-850 MG tablet Take 1 tablet by mouth 2 (two) times daily with a meal.    . simvastatin (ZOCOR) 10 MG tablet Take 10 mg by mouth daily.    . vitamin B-12 (CYANOCOBALAMIN) 1000 MCG tablet Take 1,000 mcg by mouth daily.    . naratriptan (AMERGE) 2.5 MG tablet Take 2.5 mg by mouth as needed for migraine.  Take one (1) tablet at onset of headache; if returns or does not resolve, may repeat after 4 hours; do not exceed five (5) mg in 24 hours. (Patient not taking: Reported on 07/01/2020)    . promethazine (PHENERGAN) 25 MG tablet Take 25 mg by mouth every 6 (six) hours as needed for nausea or vomiting. (Patient not taking: Reported on 07/01/2020)     No current facility-administered medications for this visit.    Review of Systems  Constitutional: Positive for malaise/fatigue (no energy) and weight loss (12 lbs). Negative for chills, diaphoresis and fever.       Doing "ok".  HENT: Positive for tinnitus. Negative for congestion, ear discharge, ear pain, hearing loss, nosebleeds, sinus pain and sore throat.   Eyes: Negative.  Negative for blurred vision  and double vision.  Respiratory: Negative.  Negative for cough, hemoptysis, sputum production and shortness of breath.   Cardiovascular: Negative.  Negative for chest pain, palpitations, orthopnea and leg swelling.  Gastrointestinal: Negative.  Negative for abdominal pain, blood in stool, constipation, diarrhea, heartburn, melena, nausea and vomiting.       No ice pica. Eating iron rich foods.  Genitourinary: Negative.  Negative for dysuria, frequency, hematuria and urgency.  Musculoskeletal: Positive for joint pain (all joints). Negative for back pain and myalgias.  Skin: Negative.  Negative for itching and rash.  Neurological: Positive for weakness. Negative for dizziness, tingling, tremors, sensory change, focal weakness and headaches.       Restless legs on gabapentin.  Endo/Heme/Allergies: Does not bruise/bleed easily.  Psychiatric/Behavioral: Negative.  Negative for depression and memory loss. The patient is not nervous/anxious and does not have insomnia.   All other systems reviewed and are negative.  Performance status (ECOG):  1  Vitals Blood pressure 101/63, pulse 79, temperature 98.3 F (36.8 C), temperature source Tympanic, resp. rate  18, height 5' (1.524 m), weight 232 lb 9.4 oz (105.5 kg), SpO2 97 %.   Physical Exam Vitals and nursing note reviewed.  Constitutional:      General: She is not in acute distress.    Appearance: She is well-developed. She is not diaphoretic.  HENT:     Head: Normocephalic and atraumatic.     Comments: Long gray hair.    Mouth/Throat:     Pharynx: No oropharyngeal exudate.  Eyes:     General: No scleral icterus.    Conjunctiva/sclera: Conjunctivae normal.     Pupils: Pupils are equal, round, and reactive to light.     Comments: Glasses. Dilated pupils.  Neck:     Vascular: No JVD.  Cardiovascular:     Rate and Rhythm: Normal rate and regular rhythm.     Heart sounds: Normal heart sounds. No murmur heard.  No friction rub. No gallop.   Pulmonary:     Effort: Pulmonary effort is normal.     Breath sounds: Normal breath sounds. No wheezing or rales.  Abdominal:     General: Bowel sounds are normal. There is no distension.     Palpations: Abdomen is soft. There is no hepatomegaly, splenomegaly or mass.     Tenderness: There is no abdominal tenderness. There is no guarding or rebound.  Musculoskeletal:        General: Normal range of motion.     Cervical back: Normal range of motion and neck supple.  Lymphadenopathy:     Head:     Right side of head: No preauricular, posterior auricular or occipital adenopathy.     Left side of head: No preauricular, posterior auricular or occipital adenopathy.     Cervical: No cervical adenopathy.     Upper Body:     Right upper body: No supraclavicular or axillary adenopathy.     Left upper body: No supraclavicular or axillary adenopathy.     Lower Body: No right inguinal adenopathy. No left inguinal adenopathy.  Skin:    General: Skin is warm and dry.     Coloration: Skin is not pale.     Findings: No erythema or rash.  Neurological:     Mental Status: She is alert and oriented to person, place, and time.  Psychiatric:        Behavior:  Behavior normal.        Thought Content: Thought content normal.  Judgment: Judgment normal.    Appointment on 06/30/2020  Component Date Value Ref Range Status  . Ferritin 06/30/2020 10* 11 - 307 ng/mL Final   Performed at [facility], [address] [place], [place] [address]  . WBC 06/30/2020 8.2  4.0 - 10.5 K/uL Final  . RBC 06/30/2020 4.25  3.87 - 5.11 MIL/uL Final  . Hemoglobin 06/30/2020 11.0* 12.0 - 15.0 g/dL Final  . HCT 06/30/2020 36.1  36 - 46 % Final  . MCV 06/30/2020 84.9  80.0 - 100.0 fL Final  . MCH 06/30/2020 25.9* 26.0 - 34.0 pg Final  . MCHC 06/30/2020 30.5  30.0 - 36.0 g/dL Final  . RDW 06/30/2020 15.2  11.5 - 15.5 % Final  . Platelets 06/30/2020 425* 150 - 400 K/uL Final  . nRBC 06/30/2020 0.0  0.0 - 0.2 % Final  . Neutrophils Relative % 06/30/2020 71  % Final  . Neutro Abs 06/30/2020 5.7  1.7 - 7.7 K/uL Final  . Lymphocytes Relative 06/30/2020 17  % Final  . Lymphs Abs 06/30/2020 1.4  0.7 - 4.0 K/uL Final  . Monocytes Relative 06/30/2020 9  % Final  . Monocytes Absolute 06/30/2020 0.8  0 - 1 K/uL Final  . Eosinophils Relative 06/30/2020 2  % Final  . Eosinophils Absolute 06/30/2020 0.2  0 - 0 K/uL Final  . Basophils Relative 06/30/2020 0  % Final  . Basophils Absolute 06/30/2020 0.0  0 - 0 K/uL Final  . Immature Granulocytes 06/30/2020 1  % Final  . Abs Immature Granulocytes 06/30/2020 0.11* 0.00 - 0.07 K/uL Final   Performed at [facility], [address] [place], [place] [address]    Assessment:  Shekela Goodridge is a 59 y.o. female with recurrent iron deficiency anemia(unclear etiology) and B12 deficiency. Dietis good. She has taken oral irondaily for a year.  Work-up on03/11/2019revealed a hematocrit of 24, hemoglobin 7.5, MCV 78.2, platelets 299,000, white count 7400. Ferritinwas 7. TIBC was560(high). Normal studies included: haptoglobin, celiac disease panel, myeloma panel, folate (7.6), urinalysis (no  hematuria), and H pylori antigen. B12was 210. Reticulocyte count was 7.5%. TSHwas 0.918 on 01/11/2019.Urinalysis on 02/04/2019 revealed no hematuria.  She has received Feraheme: 510 mg on 01/15/2018, 01/22/2018, 03/19/2018, 03/26/2018, 09/13/2018, 09/27/2018, 10/26/2018, 11/02/2018, 02/04/2019, 04/11/2019, 08/07/2019, 08/19/2019, 01/01/2020, 01/09/2020 and 04/09/2020.  Ferritinhas been followed: 7 on03/09/2018, 12 on05/04/2018, 165 on06/13/2019, 39 on08/03/2018, 15 on 09/10/2018, 60 on 10/24/2018, 96 on02/01/2019,22 on 01/31/2019, 62 on 03/01/2019, 21 on 04/02/2019, 108 on 05/08/2019, 17 on 08/06/2019, 71 on 11/06/2019, 13 on 12/31/2019, 19 on 03/31/2020 and 10 on 06/30/2020.  She has B12 deficiency. She has received B12 injections(last08/03/2018).[id number] on11/04/2017, 210 on 01/15/2018, 1727 on 06/11/2018, 1467 on 09/10/2018, and 495 on 03/31/2020.She is on oral B12.   She has folate deficiency.Folatewas5.3 on 03/01/2019, 25.0 on 04/02/2019, and 21.2 on 03/31/2020.She began oral folateon 03/04/2019.  Upper endoscopyon 07/31/2017revealed a Schatzki's ring which was dilated. There was evidence of gastritis. Duodenum was normal. She has had2-3 EGDs in the past for strictures requiring dilatation. Colonoscopyin 2011 was normal. [facility] and [facility] 03/[facility] was "normal"(no report available). She states that she had a capsule studyin 11/2018 whichrevealed"erosive gastritis"(no report available).  The patient received the [place] COVID-19 vaccine on 02/02/2020 and 02/26/2020.  Symptomatically,  she feels fatigued.  She denies any bleeding.  Diet appears good.  Exam is stable.  Plan: 1. Review labs from08/24/2021. 2.Iron deficiency anemia Hematocrit 39.0.  Hemoglobin 12.0.  MCV 87.8 on 03/31/2020.  Ferritin 19.              Hematocrit 36.1.  Hemoglobin 11.0.  MCV 84.9 on 06/30/2020.    Ferritin 10.              She received Feraheme on 04/09/2020.             Etiology of persistent iron deficiencypossibly related to erosive gastritis.                         GI recommended IV iron and PPI. Shehas received multiple infusions of IV iron.   Encourage follow-up with GI (note sent to Dr Alice Reichert). Urinalysison 02/04/2019 revealed no hematuria. She receives Feraheme if her ferritin is < 30.  Feraheme today and weekly x1 (total 2).  Discontinue oral iron as it is ineffective. 3.B12 and folate deficiency She remains on oral B12.    B12 was 495 and folate 21.2 on 03/31/2020. Check annually. 4.   Feraheme today and weekly x1 (total 2). 5.   RTC in 3 months for labs (CBC with diff, ferritin). 6.   RTC in 6 months for MD assessment, labs (CBC with diff, ferritin- day before) and +/- Feraheme.  I discussed the assessment and treatment plan with the patient.  The patient was provided an opportunity to ask questions and all were answered.  The patient agreed with the plan and demonstrated an understanding of the instructions.  The patient was advised to call back if the symptoms worsen or if the condition fails to improve as anticipated.   Lequita Asal, MD, PhD    06/30/2020, 11:50 AM  I, Mirian Mo Tufford, am acting as Education administrator for [employer]. Mike Gip, MD, PhD.  I, Melissa C. Mike Gip, MD, have reviewed the above documentation for accuracy and completeness, and I agree with the above.

## 2020-06-30 NOTE — Telephone Encounter (Signed)
spoke with the patient to inform her that she the Ferritin is 10 and she will need a Feraheme treatment, she was understanding and agreeable

## 2020-07-01 ENCOUNTER — Inpatient Hospital Stay (HOSPITAL_BASED_OUTPATIENT_CLINIC_OR_DEPARTMENT_OTHER): Payer: BC Managed Care – PPO | Admitting: Hematology and Oncology

## 2020-07-01 ENCOUNTER — Inpatient Hospital Stay: Payer: BC Managed Care – PPO

## 2020-07-01 ENCOUNTER — Encounter: Payer: Self-pay | Admitting: Hematology and Oncology

## 2020-07-01 VITALS — BP 99/58 | HR 74 | Resp 18

## 2020-07-01 VITALS — BP 101/63 | HR 79 | Temp 98.3°F | Resp 18 | Ht 60.0 in | Wt 232.6 lb

## 2020-07-01 DIAGNOSIS — D509 Iron deficiency anemia, unspecified: Secondary | ICD-10-CM

## 2020-07-01 DIAGNOSIS — E538 Deficiency of other specified B group vitamins: Secondary | ICD-10-CM

## 2020-07-01 MED ORDER — SODIUM CHLORIDE 0.9 % IV SOLN
510.0000 mg | Freq: Once | INTRAVENOUS | Status: AC
  Administered 2020-07-01: 510 mg via INTRAVENOUS
  Filled 2020-07-01: qty 17

## 2020-07-01 MED ORDER — SODIUM CHLORIDE 0.9 % IV SOLN
Freq: Once | INTRAVENOUS | Status: AC
  Filled 2020-07-01: qty 250

## 2020-07-01 NOTE — Progress Notes (Signed)
The patient reports increase SOB with movement and pain noted to her left foot and right knee today pain level 4. The patient reports dizziness orthostatics was done today

## 2020-07-08 ENCOUNTER — Other Ambulatory Visit: Payer: Self-pay

## 2020-07-08 ENCOUNTER — Inpatient Hospital Stay: Payer: BC Managed Care – PPO | Attending: Hematology and Oncology

## 2020-07-08 VITALS — BP 117/69 | HR 80 | Temp 98.6°F | Resp 18

## 2020-07-08 DIAGNOSIS — Z8249 Family history of ischemic heart disease and other diseases of the circulatory system: Secondary | ICD-10-CM | POA: Insufficient documentation

## 2020-07-08 DIAGNOSIS — Z79899 Other long term (current) drug therapy: Secondary | ICD-10-CM | POA: Diagnosis not present

## 2020-07-08 DIAGNOSIS — Z8042 Family history of malignant neoplasm of prostate: Secondary | ICD-10-CM | POA: Insufficient documentation

## 2020-07-08 DIAGNOSIS — E538 Deficiency of other specified B group vitamins: Secondary | ICD-10-CM | POA: Diagnosis not present

## 2020-07-08 DIAGNOSIS — Z832 Family history of diseases of the blood and blood-forming organs and certain disorders involving the immune mechanism: Secondary | ICD-10-CM | POA: Insufficient documentation

## 2020-07-08 DIAGNOSIS — Z820 Family history of epilepsy and other diseases of the nervous system: Secondary | ICD-10-CM | POA: Insufficient documentation

## 2020-07-08 DIAGNOSIS — M2550 Pain in unspecified joint: Secondary | ICD-10-CM | POA: Diagnosis not present

## 2020-07-08 DIAGNOSIS — D509 Iron deficiency anemia, unspecified: Secondary | ICD-10-CM | POA: Insufficient documentation

## 2020-07-08 DIAGNOSIS — Z811 Family history of alcohol abuse and dependence: Secondary | ICD-10-CM | POA: Diagnosis not present

## 2020-07-08 DIAGNOSIS — Z836 Family history of other diseases of the respiratory system: Secondary | ICD-10-CM | POA: Diagnosis not present

## 2020-07-08 DIAGNOSIS — Z818 Family history of other mental and behavioral disorders: Secondary | ICD-10-CM | POA: Insufficient documentation

## 2020-07-08 DIAGNOSIS — Z8379 Family history of other diseases of the digestive system: Secondary | ICD-10-CM | POA: Insufficient documentation

## 2020-07-08 DIAGNOSIS — R5383 Other fatigue: Secondary | ICD-10-CM | POA: Insufficient documentation

## 2020-07-08 DIAGNOSIS — Z823 Family history of stroke: Secondary | ICD-10-CM | POA: Diagnosis not present

## 2020-07-08 MED ORDER — SODIUM CHLORIDE 0.9 % IV SOLN
Freq: Once | INTRAVENOUS | Status: AC
  Filled 2020-07-08: qty 250

## 2020-07-08 MED ORDER — SODIUM CHLORIDE 0.9 % IV SOLN
510.0000 mg | Freq: Once | INTRAVENOUS | Status: AC
  Administered 2020-07-08: 510 mg via INTRAVENOUS
  Filled 2020-07-08: qty 17

## 2020-10-07 ENCOUNTER — Inpatient Hospital Stay: Payer: BC Managed Care – PPO | Attending: Hematology and Oncology

## 2020-10-07 ENCOUNTER — Other Ambulatory Visit: Payer: Self-pay

## 2020-10-07 DIAGNOSIS — Z811 Family history of alcohol abuse and dependence: Secondary | ICD-10-CM | POA: Insufficient documentation

## 2020-10-07 DIAGNOSIS — E538 Deficiency of other specified B group vitamins: Secondary | ICD-10-CM | POA: Diagnosis not present

## 2020-10-07 DIAGNOSIS — Z836 Family history of other diseases of the respiratory system: Secondary | ICD-10-CM | POA: Insufficient documentation

## 2020-10-07 DIAGNOSIS — Z832 Family history of diseases of the blood and blood-forming organs and certain disorders involving the immune mechanism: Secondary | ICD-10-CM | POA: Diagnosis not present

## 2020-10-07 DIAGNOSIS — R5383 Other fatigue: Secondary | ICD-10-CM | POA: Insufficient documentation

## 2020-10-07 DIAGNOSIS — D509 Iron deficiency anemia, unspecified: Secondary | ICD-10-CM | POA: Diagnosis not present

## 2020-10-07 DIAGNOSIS — Z8379 Family history of other diseases of the digestive system: Secondary | ICD-10-CM | POA: Insufficient documentation

## 2020-10-07 DIAGNOSIS — Z79899 Other long term (current) drug therapy: Secondary | ICD-10-CM | POA: Diagnosis not present

## 2020-10-07 DIAGNOSIS — Z8249 Family history of ischemic heart disease and other diseases of the circulatory system: Secondary | ICD-10-CM | POA: Diagnosis not present

## 2020-10-07 DIAGNOSIS — Z818 Family history of other mental and behavioral disorders: Secondary | ICD-10-CM | POA: Diagnosis not present

## 2020-10-07 DIAGNOSIS — R531 Weakness: Secondary | ICD-10-CM | POA: Insufficient documentation

## 2020-10-07 DIAGNOSIS — Z823 Family history of stroke: Secondary | ICD-10-CM | POA: Diagnosis not present

## 2020-10-07 DIAGNOSIS — Z8042 Family history of malignant neoplasm of prostate: Secondary | ICD-10-CM | POA: Insufficient documentation

## 2020-10-07 DIAGNOSIS — Z820 Family history of epilepsy and other diseases of the nervous system: Secondary | ICD-10-CM | POA: Diagnosis not present

## 2020-10-07 DIAGNOSIS — M2550 Pain in unspecified joint: Secondary | ICD-10-CM | POA: Diagnosis not present

## 2020-10-07 LAB — CBC WITH DIFFERENTIAL/PLATELET
Abs Immature Granulocytes: 0.06 10*3/uL (ref 0.00–0.07)
Basophils Absolute: 0 10*3/uL (ref 0.0–0.1)
Basophils Relative: 1 %
Eosinophils Absolute: 0.3 10*3/uL (ref 0.0–0.5)
Eosinophils Relative: 5 %
HCT: 39.7 % (ref 36.0–46.0)
Hemoglobin: 12.6 g/dL (ref 12.0–15.0)
Immature Granulocytes: 1 %
Lymphocytes Relative: 19 %
Lymphs Abs: 1.1 10*3/uL (ref 0.7–4.0)
MCH: 27.2 pg (ref 26.0–34.0)
MCHC: 31.7 g/dL (ref 30.0–36.0)
MCV: 85.7 fL (ref 80.0–100.0)
Monocytes Absolute: 0.6 10*3/uL (ref 0.1–1.0)
Monocytes Relative: 9 %
Neutro Abs: 4 10*3/uL (ref 1.7–7.7)
Neutrophils Relative %: 65 %
Platelets: 348 10*3/uL (ref 150–400)
RBC: 4.63 MIL/uL (ref 3.87–5.11)
RDW: 16.4 % — ABNORMAL HIGH (ref 11.5–15.5)
WBC: 6 10*3/uL (ref 4.0–10.5)
nRBC: 0 % (ref 0.0–0.2)

## 2020-10-07 LAB — FERRITIN: Ferritin: 22 ng/mL (ref 11–307)

## 2020-10-13 ENCOUNTER — Telehealth: Payer: Self-pay

## 2020-10-13 ENCOUNTER — Other Ambulatory Visit: Payer: Self-pay | Admitting: Hematology and Oncology

## 2020-10-13 NOTE — Telephone Encounter (Signed)
Patient aware. Please schedule.

## 2020-10-13 NOTE — Telephone Encounter (Signed)
-----   Message from Rosey Bath, MD sent at 10/13/2020  8:40 AM EST ----- Regarding: Please call patient  Ferritin 22.  Hemoglobin is normal.  Patient receives Feraheme if ferritin < 30.  Consider Feraheme x 1.  M ----- Message ----- From: Leory Plowman, Lab In [place] Sent: 10/07/2020   9:28 AM EST To: Rosey Bath, MD

## 2020-10-13 NOTE — Telephone Encounter (Signed)
Left message for patient to call back  

## 2020-10-13 NOTE — Telephone Encounter (Signed)
-----   Message from Melissa C Corcoran, MD sent at 10/13/2020  8:40 AM EST ----- Regarding: Please call patient  Ferritin 22.  Hemoglobin is normal.  Patient receives Feraheme if ferritin < 30.  Consider Feraheme x 1.  M ----- Message ----- From: Interface, Lab In [place] Sent: 10/07/2020   9:28 AM EST To: Melissa C Corcoran, MD   

## 2020-10-14 ENCOUNTER — Other Ambulatory Visit: Payer: Self-pay

## 2020-10-14 ENCOUNTER — Other Ambulatory Visit
Admission: RE | Admit: 2020-10-14 | Discharge: 2020-10-14 | Disposition: A | Discharge status: Home / Self Care | Payer: BC Managed Care – PPO | Source: Ambulatory Visit | Attending: Gastroenterology | Admitting: Gastroenterology

## 2020-10-14 DIAGNOSIS — Z79899 Other long term (current) drug therapy: Secondary | ICD-10-CM | POA: Diagnosis not present

## 2020-10-14 DIAGNOSIS — K222 Esophageal obstruction: Secondary | ICD-10-CM | POA: Diagnosis not present

## 2020-10-14 DIAGNOSIS — K449 Diaphragmatic hernia without obstruction or gangrene: Secondary | ICD-10-CM | POA: Diagnosis not present

## 2020-10-14 DIAGNOSIS — Z01812 Encounter for preprocedural laboratory examination: Secondary | ICD-10-CM | POA: Insufficient documentation

## 2020-10-14 DIAGNOSIS — Z7982 Long term (current) use of aspirin: Secondary | ICD-10-CM | POA: Diagnosis not present

## 2020-10-14 DIAGNOSIS — Z20822 Contact with and (suspected) exposure to covid-19: Secondary | ICD-10-CM | POA: Insufficient documentation

## 2020-10-14 DIAGNOSIS — R1310 Dysphagia, unspecified: Secondary | ICD-10-CM | POA: Diagnosis present

## 2020-10-14 LAB — SARS CORONAVIRUS 2 (TAT 6-24 HRS): SARS Coronavirus 2: NEGATIVE

## 2020-10-15 ENCOUNTER — Encounter: Payer: Self-pay | Admitting: *Deleted

## 2020-10-16 ENCOUNTER — Encounter
Admission: RE | Disposition: A | Discharge status: Home / Self Care | Payer: Self-pay | Source: Home / Self Care | Attending: Gastroenterology

## 2020-10-16 ENCOUNTER — Encounter: Payer: Self-pay | Admitting: *Deleted

## 2020-10-16 ENCOUNTER — Ambulatory Visit: Payer: BC Managed Care – PPO | Admitting: Certified Registered Nurse Anesthetist

## 2020-10-16 ENCOUNTER — Other Ambulatory Visit: Payer: Self-pay

## 2020-10-16 ENCOUNTER — Ambulatory Visit
Admission: RE | Admit: 2020-10-16 | Discharge: 2020-10-16 | Disposition: A | Discharge status: Home / Self Care | Payer: BC Managed Care – PPO | Attending: Gastroenterology | Admitting: Gastroenterology

## 2020-10-16 DIAGNOSIS — K449 Diaphragmatic hernia without obstruction or gangrene: Secondary | ICD-10-CM | POA: Insufficient documentation

## 2020-10-16 DIAGNOSIS — R1310 Dysphagia, unspecified: Secondary | ICD-10-CM | POA: Insufficient documentation

## 2020-10-16 DIAGNOSIS — Z20822 Contact with and (suspected) exposure to covid-19: Secondary | ICD-10-CM | POA: Insufficient documentation

## 2020-10-16 DIAGNOSIS — Z79899 Other long term (current) drug therapy: Secondary | ICD-10-CM | POA: Insufficient documentation

## 2020-10-16 DIAGNOSIS — Z7982 Long term (current) use of aspirin: Secondary | ICD-10-CM | POA: Insufficient documentation

## 2020-10-16 DIAGNOSIS — K222 Esophageal obstruction: Secondary | ICD-10-CM | POA: Insufficient documentation

## 2020-10-16 HISTORY — PX: ESOPHAGOGASTRODUODENOSCOPY: SHX5428

## 2020-10-16 LAB — GLUCOSE, CAPILLARY: Glucose-Capillary: 137 mg/dL — ABNORMAL HIGH (ref 70–99)

## 2020-10-16 SURGERY — EGD (ESOPHAGOGASTRODUODENOSCOPY)
Anesthesia: General

## 2020-10-16 MED ORDER — LIDOCAINE HCL (CARDIAC) PF 100 MG/5ML IV SOSY
PREFILLED_SYRINGE | INTRAVENOUS | Status: DC | PRN
  Administered 2020-10-16: 50 mg via INTRAVENOUS

## 2020-10-16 MED ORDER — PROPOFOL 500 MG/50ML IV EMUL
INTRAVENOUS | Status: DC | PRN
  Administered 2020-10-16: 140 ug/kg/min via INTRAVENOUS

## 2020-10-16 MED ORDER — PROPOFOL 10 MG/ML IV BOLUS
INTRAVENOUS | Status: DC | PRN
  Administered 2020-10-16: 90 mg via INTRAVENOUS
  Administered 2020-10-16 (×3): 21 mg via INTRAVENOUS

## 2020-10-16 MED ORDER — PROPOFOL 500 MG/50ML IV EMUL
INTRAVENOUS | Status: AC
  Filled 2020-10-16: qty 50

## 2020-10-16 MED ORDER — SODIUM CHLORIDE 0.9 % IV SOLN: INTRAVENOUS | Status: DC

## 2020-10-16 MED ORDER — LIDOCAINE HCL (PF) 2 % IJ SOLN
INTRAMUSCULAR | Status: AC
  Filled 2020-10-16: qty 5

## 2020-10-16 NOTE — Interval H&P Note (Signed)
History and Physical Interval Note:  10/16/2020 9:18 AM  Andrea Zhang  has presented today for surgery, with the diagnosis of DYSPHAGIA,GASTRITIS.  The various methods of treatment have been discussed with the patient and family. After consideration of risks, benefits and other options for treatment, the patient has consented to  Procedure(s): ESOPHAGOGASTRODUODENOSCOPY (EGD) (N/A) as a surgical intervention.  The patient's history has been reviewed, patient examined, no change in status, stable for surgery.  I have reviewed the patient's chart and labs.  Questions were answered to the patient's satisfaction.     Regis Bill  Ok to proceed with EGD.

## 2020-10-16 NOTE — Transfer of Care (Signed)
Immediate Anesthesia Transfer of Care Note  Patient: Andrea Zhang  Procedure(s) Performed: ESOPHAGOGASTRODUODENOSCOPY (EGD) (N/A )  Patient Location: PACU and Endoscopy Unit  Anesthesia Type:General  Level of Consciousness: drowsy  Airway & Oxygen Therapy: Patient Spontanous Breathing and Patient connected to nasal cannula oxygen  Post-op Assessment: Report given to RN and Post -op Vital signs reviewed and stable  Post vital signs: Reviewed and stable  Last Vitals:  Vitals Value Taken Time  BP 118/80 10/16/20 0951  Temp    Pulse 78 10/16/20 0952  Resp 23 10/16/20 0952  SpO2 93 % 10/16/20 0952  Vitals shown include unvalidated device data.  Last Pain:  Vitals:   10/16/20 0906  TempSrc: Tympanic  PainSc: 0-No pain         Complications: No complications documented.

## 2020-10-16 NOTE — Anesthesia Postprocedure Evaluation (Signed)
Anesthesia Post Note  Patient: Andrea Zhang  Procedure(s) Performed: ESOPHAGOGASTRODUODENOSCOPY (EGD) (N/A )  Patient location during evaluation: Endoscopy Anesthesia Type: General Level of consciousness: awake and alert Pain management: pain level controlled Vital Signs Assessment: post-procedure vital signs reviewed and stable Respiratory status: spontaneous breathing, nonlabored ventilation, respiratory function stable and patient connected to nasal cannula oxygen Cardiovascular status: blood pressure returned to baseline and stable Postop Assessment: no apparent nausea or vomiting Anesthetic complications: no   No complications documented.   Last Vitals:  Vitals:   10/16/20 1011 10/16/20 1021  BP: 116/86 126/78  Pulse: 70 70  Resp: 16 20  Temp:    SpO2: 100% 97%    Last Pain:  Vitals:   10/16/20 1021  TempSrc:   PainSc: 0-No pain                 Lenard Simmer

## 2020-10-16 NOTE — H&P (Signed)
Outpatient short stay form Pre-procedure 10/16/2020 9:15 AM Raylene Miyamoto MD, MPH  Primary Physician: NP Gauger  Reason for visit:  Dysphagia  History of present illness:   59 y/o lady with history of schatzki ring here with dysphagia. Plan for EGD for evaluation. No family history of GI malignancies, blood thinners, or major GI surgeries. No neck surgery. Dysphagia mainly to solids and dry foods. Has history of IDA as well.    Current Facility-Administered Medications:  .  0.9 %  sodium chloride infusion, , Intravenous, Continuous, Gabriellia Rempel, Hilton Cork, MD  Medications Prior to Admission  Medication Sig Dispense Refill Last Dose  . aspirin EC 81 MG tablet Take 81 mg by mouth daily.   10/15/2020 at Unknown time  . atenolol (TENORMIN) 50 MG tablet Take 50 mg by mouth daily.   10/15/2020 at Unknown time  . gabapentin (NEURONTIN) 800 MG tablet Take 1 tablet by mouth daily.   10/15/2020 at Unknown time  . omeprazole (PRILOSEC) 40 MG capsule Take 40 mg by mouth 2 (two) times daily.   10/15/2020 at Unknown time  . pioglitazone-metformin (ACTOPLUS MET) 15-850 MG tablet Take 1 tablet by mouth 2 (two) times daily with a meal.   10/15/2020 at Unknown time  . simvastatin (ZOCOR) 10 MG tablet Take 10 mg by mouth daily.   10/15/2020 at Unknown time  . albuterol (PROVENTIL HFA;VENTOLIN HFA) 108 (90 Base) MCG/ACT inhaler Inhale 2 puffs into the lungs every 6 (six) hours as needed for wheezing or shortness of breath.     . cetirizine (ZYRTEC) 10 MG tablet Take 10 mg by mouth as needed for allergies.     . Cholecalciferol 2000 units CAPS Take 2,000 Units by mouth daily.     . cyclobenzaprine (FLEXERIL) 5 MG tablet Take 5 mg by mouth 3 (three) times daily as needed for muscle spasms.     . ferrous gluconate (FERGON) 324 MG tablet Take 324 mg by mouth daily with breakfast. (Patient not taking: Reported on 10/16/2020)   Not Taking at Unknown time  . hydrochlorothiazide (HYDRODIURIL) 12.5 MG tablet Take 12.5 mg  by mouth as needed (fluid). (Patient not taking: Reported on 10/16/2020)   Not Taking at Unknown time  . Melatonin 10 MG TABS Take 1 tablet by mouth at bedtime as needed.     . mirtazapine (REMERON) 15 MG tablet Take 15 mg by mouth at bedtime.      . naratriptan (AMERGE) 2.5 MG tablet Take 2.5 mg by mouth as needed for migraine. Take one (1) tablet at onset of headache; if returns or does not resolve, may repeat after 4 hours; do not exceed five (5) mg in 24 hours. (Patient not taking: Reported on 07/01/2020)     . nystatin-triamcinolone (MYCOLOG II) cream Apply topically as needed.     . promethazine (PHENERGAN) 25 MG tablet Take 25 mg by mouth every 6 (six) hours as needed for nausea or vomiting. (Patient not taking: Reported on 07/01/2020)     . vitamin B-12 (CYANOCOBALAMIN) 1000 MCG tablet Take 1,000 mcg by mouth daily.        Allergies  Allergen Reactions  . Iodinated Diagnostic Agents Other (See Comments)    Becomes 'unresponsive' to ORAL and IV DYE BETADINE ON THE SKIN IS OKAY unresponsive Patient could hear things but not responsive Becomes 'unresponsive' to ORAL and IV DYE BETADINE ON THE SKIN IS OKAY  Patient could hear things but not responsive Becomes 'unresponsive' to ORAL and IV DYE BETADINE ON  THE SKIN IS OKAY Becomes 'unresponsive' to ORAL and IV DYE BETADINE ON THE SKIN IS OKAY unresponsive Patient could hear things but not responsiveBecomes 'unresponsive' to ORAL and IV DYE BETADINE ON THE SKIN IS OKAY  . Diclofenac Hives    HORRIBLE RASH with both PATCH OR CREAM  . Ibuprofen Itching  . Zyrtec [Cetirizine] Other (See Comments)    HEADACHE   . Cephalexin Rash  . Maxalt [Rizatriptan Benzoate] Other (See Comments)    'Heart Races'  . Orphenadrine Citrate Other (See Comments)    Patient unsure of this allergy.  Marland Kitchen Zithromax [Azithromycin] Other (See Comments)    Severe Abdominal Cramps  . Zomig [Zolmitriptan] Other (See Comments)    'Heart Races'     Past  Medical History:  Diagnosis Date  . Anemia    iron deficiency and b12 deficiency  . Anxiety   . Arthritis   . Asthma   . Diabetes mellitus without complication ([place])   . Fasciitis    left foot  . GERD (gastroesophageal reflux disease)   . History of kidney stones   . Hypercholesterolemia   . Hypertension   . Migraines    MIGRAINES HAVE IMPROVED SINCE RECEIVING IRON    Review of systems:  Otherwise negative.    Physical Exam  Gen: Alert, oriented. Appears stated age.  HEENT: PERRLA. Lungs: No respiratory distress CV: RRR Abd: soft, benign, no masses Ext: No edema    Planned procedures: Proceed with EGD. The patient understands the nature of the planned procedure, indications, risks, alternatives and potential complications including but not limited to bleeding, infection, perforation, damage to internal organs and possible oversedation/side effects from anesthesia. The patient agrees and gives consent to proceed.  Please refer to procedure notes for findings, recommendations and patient disposition/instructions.     Raylene Miyamoto MD, MPH Gastroenterology 10/16/2020  9:15 AM

## 2020-10-16 NOTE — Anesthesia Preprocedure Evaluation (Signed)
Anesthesia Evaluation  Patient identified by MRN, date of birth, ID band Patient awake    Reviewed: Allergy & Precautions, NPO status , Patient's Chart, lab work & pertinent test results  History of Anesthesia Complications Negative for: history of anesthetic complications  Airway Mallampati: III       Dental  (+) Teeth Intact, Dental Advidsory Given   Pulmonary neg shortness of breath, asthma , neg sleep apnea, neg recent URI,           Cardiovascular Exercise Tolerance: Good hypertension, Pt. on home beta blockers (-) angina(-) CAD, (-) Past MI, (-) Cardiac Stents and (-) CABG (-) dysrhythmias (-) Valvular Problems/Murmurs     Neuro/Psych  Headaches, neg Seizures PSYCHIATRIC DISORDERS Anxiety    GI/Hepatic Neg liver ROS, GERD  Medicated,  Endo/Other  diabetes, Type 2, Oral Hypoglycemic AgentsMorbid obesity  Renal/GU negative Renal ROS     Musculoskeletal   Abdominal (+)  Abdomen: soft.    Peds  Hematology   Anesthesia Other Findings Past Medical History: No date: Anemia     Comment:  iron deficiency and b12 deficiency No date: Anxiety No date: Arthritis No date: Asthma No date: Diabetes mellitus without complication (HCC) No date: GERD (gastroesophageal reflux disease) No date: History of kidney stones No date: Hypercholesterolemia No date: Hypertension No date: Migraines     Comment:  MIGRAINES HAVE IMPROVED SINCE RECEIVING IRON   Reproductive/Obstetrics negative OB ROS                             Anesthesia Physical  Anesthesia Plan  ASA: III  Anesthesia Plan: General   Post-op Pain Management:    Induction: Intravenous  PONV Risk Score and Plan: 3 and Propofol infusion and TIVA  Airway Management Planned: Natural Airway and Nasal Cannula  Additional Equipment:   Intra-op Plan:   Post-operative Plan:   Informed Consent: I have reviewed the patients History  and Physical, chart, labs and discussed the procedure including the risks, benefits and alternatives for the proposed anesthesia with the patient or authorized representative who has indicated his/her understanding and acceptance.       Plan Discussed with: CRNA  Anesthesia Plan Comments:         Anesthesia Quick Evaluation

## 2020-10-16 NOTE — Op Note (Addendum)
[facility] Gastroenterology Patient Name: Andrea Zhang Procedure Date: 10/16/2020 9:10 AM MRN: [id number] Account #: [id number] Date of Birth: July 03, 1961 Admit Type: Outpatient Age: 59 Room: [facility] ENDO ROOM 2 Gender: Female Note Status: Supervisor Override Procedure:             Upper GI endoscopy Indications:           Iron deficiency anemia, Dysphagia Providers:             Eather Colas MD, MD Referring MD:          Caryl Asp (Referring MD) Medicines:             Monitored Anesthesia Care Complications:         No immediate complications. Estimated blood loss:                         Minimal. Procedure:             Pre-Anesthesia Assessment:                        - Prior to the procedure, a History and Physical was                         performed, and patient medications and allergies were                         reviewed. The patient is competent. The risks and                         benefits of the procedure and the sedation options and                         risks were discussed with the patient. All questions                         were answered and informed consent was obtained.                         Patient identification and proposed procedure were                         verified by the physician, the nurse, the anesthetist                         and the technician in the endoscopy suite. Mental                         Status Examination: alert and oriented. Airway                         Examination: normal oropharyngeal airway and neck                         mobility. Respiratory Examination: clear to                         auscultation. CV Examination: normal. Prophylactic  Antibiotics: The patient does not require prophylactic                         antibiotics. Prior Anticoagulants: The patient has                         taken no previous anticoagulant or antiplatelet                         agents.  ASA Grade Assessment: III - A patient with                         severe systemic disease. After reviewing the risks and                         benefits, the patient was deemed in satisfactory                         condition to undergo the procedure. The anesthesia                         plan was to use monitored anesthesia care (MAC).                         Immediately prior to administration of medications,                         the patient was re-assessed for adequacy to receive                         sedatives. The heart rate, respiratory rate, oxygen                         saturations, blood pressure, adequacy of pulmonary                         ventilation, and response to care were monitored                         throughout the procedure. The physical status of the                         patient was re-assessed after the procedure.                        After obtaining informed consent, the endoscope was                         passed under direct vision. Throughout the procedure,                         the patient's blood pressure, pulse, and oxygen                         saturations were monitored continuously. The Endoscope                         was introduced through the mouth, and advanced to the  second part of duodenum. The upper GI endoscopy was                         accomplished without difficulty. The patient tolerated                         the procedure well. Findings:      A non-obstructing Schatzki ring was found at the lower esophageal       sphincter. A TTS dilator was passed through the scope. Dilation with a       15-16.5-18 mm balloon dilator was performed to 18 mm. The dilation site       was examined and showed mild mucosal disruption. Estimated blood loss       was minimal.      Normal mucosa was found in the entire esophagus. Biopsies were obtained       from the proximal and distal esophagus with cold forceps for  histology       of suspected eosinophilic esophagitis. Estimated blood loss was minimal.      A 6 cm hiatal hernia was present.      The entire examined stomach was normal.      The examined duodenum was normal. Impression:            - Non-obstructing Schatzki ring. Dilated.                        - Normal mucosa was found in the entire esophagus.                         Biopsied.                        - 6 cm hiatal hernia.                        - Normal stomach.                        - Normal examined duodenum. Recommendation:        - Discharge patient to home.                        - Resume previous diet.                        - Continue present medications.                        - Await pathology results.                        - Return to referring physician as previously                         scheduled. Procedure Code(s):     --- Professional ---                        [phone], Esophagogastroduodenoscopy, flexible,                         transoral; with transendoscopic balloon dilation of  esophagus (less than 30 mm diameter) Diagnosis Code(s):     --- Professional ---                        K22.2, Esophageal obstruction                        K44.9, Diaphragmatic hernia without obstruction or                         gangrene                        D50.9, Iron deficiency anemia, unspecified                        R13.10, Dysphagia, unspecified CPT copyright 2019 American Medical Association. All rights reserved. The codes documented in this report are preliminary and upon coder review may  be revised to meet current compliance requirements. Eather Colas, MD Eather Colas MD, MD 10/16/2020 9:52:44 AM Number of Addenda: 0 Note Initiated On: 10/16/2020 9:10 AM Estimated Blood Loss:  Estimated blood loss was minimal.      [facility]

## 2020-10-19 ENCOUNTER — Other Ambulatory Visit: Payer: Self-pay

## 2020-10-19 ENCOUNTER — Encounter: Payer: Self-pay | Admitting: Gastroenterology

## 2020-10-19 ENCOUNTER — Inpatient Hospital Stay: Payer: BC Managed Care – PPO

## 2020-10-19 VITALS — BP 132/79 | HR 88 | Temp 97.0°F | Resp 16

## 2020-10-19 DIAGNOSIS — D509 Iron deficiency anemia, unspecified: Secondary | ICD-10-CM

## 2020-10-19 LAB — SURGICAL PATHOLOGY

## 2020-10-19 MED ORDER — SODIUM CHLORIDE 0.9 % IV SOLN
Freq: Once | INTRAVENOUS | Status: AC
  Filled 2020-10-19: qty 250

## 2020-10-19 MED ORDER — IRON SUCROSE 20 MG/ML IV SOLN
200.0000 mg | Freq: Once | INTRAVENOUS | Status: AC
  Administered 2020-10-19: 14:00:00 200 mg via INTRAVENOUS

## 2020-10-19 MED ORDER — SODIUM CHLORIDE 0.9 % IV SOLN: 200.0000 mg | Freq: Once | INTRAVENOUS | Status: DC

## 2020-10-19 MED ORDER — IRON SUCROSE 20 MG/ML IV SOLN
INTRAVENOUS | Status: AC
  Filled 2020-10-19: qty 10

## 2021-01-05 ENCOUNTER — Other Ambulatory Visit: Payer: Self-pay

## 2021-01-05 ENCOUNTER — Inpatient Hospital Stay: Payer: BC Managed Care – PPO | Attending: Hematology and Oncology

## 2021-01-05 DIAGNOSIS — Z820 Family history of epilepsy and other diseases of the nervous system: Secondary | ICD-10-CM | POA: Insufficient documentation

## 2021-01-05 DIAGNOSIS — Z8379 Family history of other diseases of the digestive system: Secondary | ICD-10-CM | POA: Insufficient documentation

## 2021-01-05 DIAGNOSIS — Z836 Family history of other diseases of the respiratory system: Secondary | ICD-10-CM | POA: Insufficient documentation

## 2021-01-05 DIAGNOSIS — Z79899 Other long term (current) drug therapy: Secondary | ICD-10-CM | POA: Insufficient documentation

## 2021-01-05 DIAGNOSIS — Z832 Family history of diseases of the blood and blood-forming organs and certain disorders involving the immune mechanism: Secondary | ICD-10-CM | POA: Diagnosis not present

## 2021-01-05 DIAGNOSIS — R531 Weakness: Secondary | ICD-10-CM | POA: Diagnosis not present

## 2021-01-05 DIAGNOSIS — Z811 Family history of alcohol abuse and dependence: Secondary | ICD-10-CM | POA: Insufficient documentation

## 2021-01-05 DIAGNOSIS — M2550 Pain in unspecified joint: Secondary | ICD-10-CM | POA: Insufficient documentation

## 2021-01-05 DIAGNOSIS — Z8042 Family history of malignant neoplasm of prostate: Secondary | ICD-10-CM | POA: Diagnosis not present

## 2021-01-05 DIAGNOSIS — E538 Deficiency of other specified B group vitamins: Secondary | ICD-10-CM | POA: Diagnosis not present

## 2021-01-05 DIAGNOSIS — Z823 Family history of stroke: Secondary | ICD-10-CM | POA: Diagnosis not present

## 2021-01-05 DIAGNOSIS — Z8249 Family history of ischemic heart disease and other diseases of the circulatory system: Secondary | ICD-10-CM | POA: Insufficient documentation

## 2021-01-05 DIAGNOSIS — Z818 Family history of other mental and behavioral disorders: Secondary | ICD-10-CM | POA: Diagnosis not present

## 2021-01-05 DIAGNOSIS — D509 Iron deficiency anemia, unspecified: Secondary | ICD-10-CM | POA: Insufficient documentation

## 2021-01-05 LAB — CBC WITH DIFFERENTIAL/PLATELET
Abs Immature Granulocytes: 0.05 10*3/uL (ref 0.00–0.07)
Basophils Absolute: 0 10*3/uL (ref 0.0–0.1)
Basophils Relative: 1 %
Eosinophils Absolute: 0.2 10*3/uL (ref 0.0–0.5)
Eosinophils Relative: 3 %
HCT: 35 % — ABNORMAL LOW (ref 36.0–46.0)
Hemoglobin: 10.2 g/dL — ABNORMAL LOW (ref 12.0–15.0)
Immature Granulocytes: 1 %
Lymphocytes Relative: 19 %
Lymphs Abs: 1.2 10*3/uL (ref 0.7–4.0)
MCH: 22.6 pg — ABNORMAL LOW (ref 26.0–34.0)
MCHC: 29.1 g/dL — ABNORMAL LOW (ref 30.0–36.0)
MCV: 77.4 fL — ABNORMAL LOW (ref 80.0–100.0)
Monocytes Absolute: 0.6 10*3/uL (ref 0.1–1.0)
Monocytes Relative: 10 %
Neutro Abs: 4.2 10*3/uL (ref 1.7–7.7)
Neutrophils Relative %: 66 %
Platelets: 431 10*3/uL — ABNORMAL HIGH (ref 150–400)
RBC: 4.52 MIL/uL (ref 3.87–5.11)
RDW: 15.4 % (ref 11.5–15.5)
WBC: 6.3 10*3/uL (ref 4.0–10.5)
nRBC: 0 % (ref 0.0–0.2)

## 2021-01-05 LAB — FERRITIN: Ferritin: 7 ng/mL — ABNORMAL LOW (ref 11–307)

## 2021-01-05 NOTE — Progress Notes (Signed)
[name]  [address] [place], [place] [address] Phone: [phone]  Fax: [phone]   Clinic Day: 01/06/21  Referring physician: [name], *  Chief Complaint: Andrea Zhang is a 60 y.o. female with iron deficiency anemia who is seen for 6 month assessment.  HPI: The patient was last seen in the hematology clinic on 07/01/2020. At that time, she felt fatigued. She denied any bleeding. Diet appeared good. Exam was stable. Hematocrit was 36.1, hemoglobin 11.0, MCV 84.9, platelets 425,000, WBC 8,200. Ferritin was 10.  She was to continue oral B12. Oral iron was discontinued. She received Feraheme x 2 (07/01/2020 and 07/08/2020).  EGD on 10/16/2020 by Dr. Haig Prophet revealed a non-obstructing Schatzki ring. There was a 6 cm hiatal hernia.  Labs followed: 10/07/2020: Hematocrit 39.7, hemoglobin 12.6, MCV 85.7, platelets 348,000, WBC 6,000. Ferritin 22. 01/05/2021: Hematocrit 35.0, hemoglobin 10.2, MCV 77.4, platelets 431,000, WBC 6,300. Ferritin   7.  She received Venofer on 10/19/2020 secondary to insurance coverage.  During the interim, she has been "not good." She is very tired, fatigued, and dizzy. She has been feeling this way for a few weeks. She also has restless legs and joint pain. She denies bleeding of any kind and of pica. She is eating well.  She has a brace on her right foot for tendonitis.   The patient is postmenopausal. She does not feel a big burst of energy after her infusions but she does not get tired as easily after them. She would like to have labs done every 2 months instead of every 3 months going forward.   Past Medical History:  Diagnosis Date  . Anemia    iron deficiency and b12 deficiency  . Anxiety   . Arthritis   . Asthma   . Diabetes mellitus without complication ([place])   . Fasciitis    left foot  . GERD (gastroesophageal reflux disease)   . History of kidney stones   . Hypercholesterolemia   .  Hypertension   . Migraines    MIGRAINES HAVE IMPROVED SINCE RECEIVING IRON    Past Surgical History:  Procedure Laterality Date  . CHOLECYSTECTOMY  2004  . COLONOSCOPY WITH ESOPHAGOGASTRODUODENOSCOPY (EGD)  02/2018  . ESOPHAGOGASTRODUODENOSCOPY N/A 10/16/2020   Procedure: ESOPHAGOGASTRODUODENOSCOPY (EGD);  Surgeon: Lesly Rubenstein, MD;  Location: [facility] ENDOSCOPY;  Service: Endoscopy;  Laterality: N/A;  . ESOPHAGOGASTRODUODENOSCOPY (EGD) WITH PROPOFOL N/A 06/06/2016   Procedure: ESOPHAGOGASTRODUODENOSCOPY (EGD) WITH PROPOFOL;  Surgeon: Manya Silvas, MD;  Location: [facility] ENDOSCOPY;  Service: Endoscopy;  Laterality: N/A;  . EXTRACORPOREAL SHOCK WAVE LITHOTRIPSY  2010  . JOINT REPLACEMENT  2013   LT TKR  . SAVORY DILATION N/A 06/06/2016   Procedure: SAVORY DILATION;  Surgeon: Manya Silvas, MD;  Location: [facility] ENDOSCOPY;  Service: Endoscopy;  Laterality: N/A;  . SAVORY DILATION  02/2018  . SHOULDER ARTHROSCOPY WITH OPEN ROTATOR CUFF REPAIR Right 05/24/2018   Procedure: right shoulder arthroscopy, extensive arthroscopic debridement, decompression, open rotator cuff repair, biceps tenodesis;  Surgeon: Corky Mull, MD;  Location: [facility] ORS;  Service: Orthopedics;  Laterality: Right;    Family History  Problem Relation Age of Onset  . COPD Mother   . Heart disease Mother   . Asthma Mother   . Schizophrenia Mother   . Hemophilia Father   . Prostate cancer Father   . Heart disease Father   . Epilepsy Sister   . Stroke Sister   . Alcohol abuse Sister   .  Hypertension Sister   . Epilepsy Brother   . Lupus Daughter   . Gallbladder disease Daughter   . Breast cancer Neg Hx     Social History:  reports that she has never smoked. She has never used smokeless tobacco. She reports that she does not drink alcohol and does not use drugs. She has 4 children and 7 grandchildren. She lives in [place].  The patient is alone today.  Allergies:  Allergies  Allergen Reactions  .  Iodinated Diagnostic Agents Other (See Comments)    Becomes 'unresponsive' to ORAL and IV DYE BETADINE ON THE SKIN IS OKAY unresponsive Patient could hear things but not responsive Becomes 'unresponsive' to ORAL and IV DYE BETADINE ON THE SKIN IS OKAY  Patient could hear things but not responsive Becomes 'unresponsive' to ORAL and IV DYE BETADINE ON THE SKIN IS OKAY Becomes 'unresponsive' to ORAL and IV DYE BETADINE ON THE SKIN IS OKAY unresponsive Patient could hear things but not responsiveBecomes 'unresponsive' to ORAL and IV DYE BETADINE ON THE SKIN IS OKAY  . Diclofenac Hives    HORRIBLE RASH with both PATCH OR CREAM  . Ibuprofen Itching  . Zyrtec [Cetirizine] Other (See Comments)    HEADACHE   . Cephalexin Rash  . Maxalt [Rizatriptan Benzoate] Other (See Comments)    'Heart Races'  . Orphenadrine Citrate Other (See Comments)    Patient unsure of this allergy.  Marland Kitchen Zithromax [Azithromycin] Other (See Comments)    Severe Abdominal Cramps  . Zomig [Zolmitriptan] Other (See Comments)    'Heart Races'    Current Medications: Current Outpatient Medications  Medication Sig Dispense Refill  . albuterol (PROVENTIL HFA;VENTOLIN HFA) 108 (90 Base) MCG/ACT inhaler Inhale 2 puffs into the lungs every 6 (six) hours as needed for wheezing or shortness of breath.    Marland Kitchen aspirin EC 81 MG tablet Take 81 mg by mouth daily.    Marland Kitchen atenolol (TENORMIN) 50 MG tablet Take 50 mg by mouth daily.    . cetirizine (ZYRTEC) 10 MG tablet Take 10 mg by mouth as needed for allergies.    . Cholecalciferol 2000 units CAPS Take 2,000 Units by mouth daily.    . cyclobenzaprine (FLEXERIL) 5 MG tablet Take 5 mg by mouth 3 (three) times daily as needed for muscle spasms.    Marland Kitchen gabapentin (NEURONTIN) 800 MG tablet Take 1 tablet by mouth daily.    . Melatonin 10 MG TABS Take 1 tablet by mouth at bedtime as needed.    . mirtazapine (REMERON) 15 MG tablet Take 15 mg by mouth at bedtime.     . Multiple Vitamin  (MULTI-VITAMIN) tablet Take 1 tablet by mouth daily.    . naratriptan (AMERGE) 2.5 MG tablet Take 2.5 mg by mouth as needed for migraine. Take one (1) tablet at onset of headache; if returns or does not resolve, may repeat after 4 hours; do not exceed five (5) mg in 24 hours.    Marland Kitchen omeprazole (PRILOSEC) 40 MG capsule Take 40 mg by mouth 2 (two) times daily.    . pioglitazone-metformin (ACTOPLUS MET) 15-850 MG tablet Take 1 tablet by mouth 2 (two) times daily with a meal.    . promethazine (PHENERGAN) 25 MG tablet Take 25 mg by mouth every 6 (six) hours as needed for nausea or vomiting.    . simvastatin (ZOCOR) 10 MG tablet Take 10 mg by mouth daily.    . SUMAtriptan (IMITREX) 100 MG tablet Take by mouth.    Marland Kitchen  vitamin B-12 (CYANOCOBALAMIN) 1000 MCG tablet Take 1,000 mcg by mouth daily.    . hydrochlorothiazide (HYDRODIURIL) 12.5 MG tablet Take 12.5 mg by mouth as needed (fluid). (Patient not taking: No sig reported)     No current facility-administered medications for this visit.    Review of Systems  Constitutional: Positive for malaise/fatigue. Negative for chills, diaphoresis, fever and weight loss (up 3 lbs).       Feels "not good." No energy.  HENT: Negative for congestion, ear discharge, ear pain, hearing loss, nosebleeds, sinus pain, sore throat and tinnitus.   Eyes: Negative.  Negative for blurred vision and double vision.  Respiratory: Negative.  Negative for cough, hemoptysis, sputum production and shortness of breath.   Cardiovascular: Negative.  Negative for chest pain, palpitations, orthopnea and leg swelling.  Gastrointestinal: Negative.  Negative for abdominal pain, blood in stool, constipation, diarrhea, heartburn, melena, nausea and vomiting.       No ice pica. Eating well.  Genitourinary: Negative.  Negative for dysuria, frequency, hematuria and urgency.  Musculoskeletal: Positive for joint pain (all joints). Negative for back pain, myalgias and neck pain.  Skin: Negative.   Negative for itching and rash.  Neurological: Negative for dizziness, tingling, sensory change, weakness and headaches.       Restless legs.  Endo/Heme/Allergies: Does not bruise/bleed easily.  Psychiatric/Behavioral: Negative.  Negative for depression and memory loss. The patient is not nervous/anxious and does not have insomnia.   All other systems reviewed and are negative.  Performance status (ECOG):  1  Vitals Blood pressure 122/75, pulse 87, temperature (!) 97 F (36.1 C), temperature source Tympanic, weight 235 lb 14.3 oz (107 kg), SpO2 100 %.   Physical Exam Vitals and nursing note reviewed.  Constitutional:      General: She is not in acute distress.    Appearance: She is well-developed. She is not diaphoretic.  HENT:     Head: Normocephalic and atraumatic.     Comments: Long gray hair.    Mouth/Throat:     Mouth: Mucous membranes are moist.     Pharynx: Oropharynx is clear. No oropharyngeal exudate.  Eyes:     General: No scleral icterus.    Extraocular Movements: Extraocular movements intact.     Conjunctiva/sclera: Conjunctivae normal.     Pupils: Pupils are equal, round, and reactive to light.     Comments: Glasses.  Neck:     Vascular: No JVD.  Cardiovascular:     Rate and Rhythm: Normal rate and regular rhythm.     Heart sounds: Normal heart sounds. No murmur heard. No friction rub. No gallop.   Pulmonary:     Effort: Pulmonary effort is normal.     Breath sounds: Normal breath sounds. No wheezing or rales.  Chest:  Breasts:     Right: No axillary adenopathy or supraclavicular adenopathy.     Left: No axillary adenopathy or supraclavicular adenopathy.    Abdominal:     General: Bowel sounds are normal. There is no distension.     Palpations: Abdomen is soft. There is no hepatomegaly, splenomegaly or mass.     Tenderness: There is no abdominal tenderness. There is no guarding or rebound.  Musculoskeletal:     Cervical back: Normal range of motion and  neck supple.     Right lower leg: No edema.     Left lower leg: No edema.     Comments: Brace on right foot  Lymphadenopathy:     Head:  Right side of head: No preauricular, posterior auricular or occipital adenopathy.     Left side of head: No preauricular, posterior auricular or occipital adenopathy.     Cervical: No cervical adenopathy.     Upper Body:     Right upper body: No supraclavicular or axillary adenopathy.     Left upper body: No supraclavicular or axillary adenopathy.     Lower Body: No right inguinal adenopathy. No left inguinal adenopathy.  Skin:    General: Skin is warm and dry.     Coloration: Skin is pale.     Findings: No erythema or rash.  Neurological:     Mental Status: She is alert and oriented to person, place, and time.  Psychiatric:        Behavior: Behavior normal.        Thought Content: Thought content normal.        Judgment: Judgment normal.    Appointment on 01/05/2021  Component Date Value Ref Range Status  . Ferritin 01/05/2021 7* 11 - 307 ng/mL Final   Performed at [facility], [address] [place], [place] [address]  . WBC 01/05/2021 6.3  4.0 - 10.5 K/uL Final  . RBC 01/05/2021 4.52  3.87 - 5.11 MIL/uL Final  . Hemoglobin 01/05/2021 10.2* 12.0 - 15.0 g/dL Final  . HCT 01/05/2021 35.0* 36.0 - 46.0 % Final  . MCV 01/05/2021 77.4* 80.0 - 100.0 fL Final  . MCH 01/05/2021 22.6* 26.0 - 34.0 pg Final  . MCHC 01/05/2021 29.1* 30.0 - 36.0 g/dL Final  . RDW 01/05/2021 15.4  11.5 - 15.5 % Final  . Platelets 01/05/2021 431* 150 - 400 K/uL Final  . nRBC 01/05/2021 0.0  0.0 - 0.2 % Final  . Neutrophils Relative % 01/05/2021 66  % Final  . Neutro Abs 01/05/2021 4.2  1.7 - 7.7 K/uL Final  . Lymphocytes Relative 01/05/2021 19  % Final  . Lymphs Abs 01/05/2021 1.2  0.7 - 4.0 K/uL Final  . Monocytes Relative 01/05/2021 10  % Final  . Monocytes Absolute 01/05/2021 0.6  0.1 - 1.0 K/uL Final  . Eosinophils Relative 01/05/2021 3  % Final  .  Eosinophils Absolute 01/05/2021 0.2  0.0 - 0.5 K/uL Final  . Basophils Relative 01/05/2021 1  % Final  . Basophils Absolute 01/05/2021 0.0  0.0 - 0.1 K/uL Final  . Immature Granulocytes 01/05/2021 1  % Final  . Abs Immature Granulocytes 01/05/2021 0.05  0.00 - 0.07 K/uL Final   Performed at [facility], [address] [place], [place] [address]    Assessment:  Parris Signer is a 60 y.o. female with recurrent iron deficiency anemia(unclear etiology) and B12 deficiency. Dietis good. She has taken oral irondaily for a year.  Work-up on03/11/2019revealed a hematocrit of 24, hemoglobin 7.5, MCV 78.2, platelets 299,000, white count 7400. Ferritinwas 7. TIBC was560(high). Normal studies included: haptoglobin, celiac disease panel, myeloma panel, folate (7.6), urinalysis (no hematuria), and H pylori antigen. B12was 210. Reticulocyte count was 7.5%. TSHwas 0.918 on 01/11/2019.Urinalysis on 02/04/2019 revealed no hematuria.  She has received Feraheme: 510 mg on 01/15/2018, 01/22/2018, 03/19/2018, 03/26/2018, 09/13/2018, 09/27/2018, 10/26/2018, 11/02/2018, 02/04/2019, 04/11/2019, 08/07/2019, 08/19/2019, 01/01/2020, 01/09/2020, 04/09/2020, 07/01/2020 and 07/08/2020.  She received Venofer on 10/19/2020.  Ferritinhas been followed: 7 on03/09/2018, 12 on05/04/2018, 165 on06/13/2019, 39 on08/03/2018, 15 on 09/10/2018, 60 on 10/24/2018, 96 on02/01/2019,22 on 01/31/2019, 62 on 03/01/2019, 21 on 04/02/2019, 108 on 05/08/2019, 17 on 08/06/2019, 71 on 11/06/2019, 13 on 12/31/2019, 19  on 03/31/2020, 10 on 06/30/2020, 22 on 10/07/2020 and 7 on 01/05/2021.  She has B12 deficiency. She has received B12 injections(last08/03/2018).[id number] on11/04/2017, 210 on 01/15/2018, 1727 on 06/11/2018, 1467 on 09/10/2018, and 495 on 03/31/2020.She is on oral B12.   She has folate deficiency.Folatewas5.3 on 03/01/2019, 25.0 on 04/02/2019, and 21.2 on 03/31/2020.She  began oral folateon 03/04/2019.  Upper endoscopyon 07/31/2017revealed a Schatzki's ring which was dilated. There was evidence of gastritis. Duodenum was normal. She has had2-3 EGDs in the past for strictures requiring dilatation. Colonoscopyin 2011 was normal. EGD and colonoscopyin 03/2018 at [facility] was "normal". EGD on 10/16/2020 revealed a non-obstructing Schatzki ring. There was a 6 cm hiatal hernia.  She states that she had a capsule studyin 11/2018 whichrevealed"erosive gastritis"(no report available).  The patient received the [place] COVID-19 vaccine on 02/02/2020 and 02/26/2020.  Symptomatically, she is very tired, fatigued, and dizzy. She has been feeling this way for a few weeks. She has restless legs. She denies bleeding of any kind of pica. She is eating well.  Exam is stable.  Plan: 1. Review labs from03/11/2020. 2.Iron deficiency anemia Hematocrit 35.0.  Hemoglobin 10.2.  MCV 77.4 on 01/05/2021.   Ferritin 7.              Etiology of persistent iron deficiencypossibly related to erosive gastritis. Shehas received multiple infusions of IV iron.   Encourage follow-up with GI. Urinalysison 02/04/2019 revealed no hematuria. She receives IV iron if her ferritin is < 30.  Venofer today and weekly x4 (total 5). 3.B12 and folate deficiency She is on oral B12.    B12 was 495 and folate 21.2 on 03/31/2020. Check annually. 4.   Venofer today and weekly x 4 (total 5). 5.   RTC in 2 months for labs (CBC, ferritin). 6.   RTC in 4 months for MD assessment, labs (CBC with diff, ferritin, iron studies- day before) and +/- Venofer.  I discussed the assessment and treatment plan with the patient.  The patient was provided an opportunity to ask questions and all were answered.  The patient agreed with the plan and demonstrated an understanding of the instructions.  The patient was advised to  call back if the symptoms worsen or if the condition fails to improve as anticipated.   Lequita Asal, MD, PhD    01/06/2021, 1:00 PM   I, Mirian Mo Tufford, am acting as Education administrator for [employer]. Mike Gip, MD, PhD.  I, Jevonte Clanton C. Mike Gip, MD, have reviewed the above documentation for accuracy and completeness, and I agree with the above.

## 2021-01-06 ENCOUNTER — Inpatient Hospital Stay (HOSPITAL_BASED_OUTPATIENT_CLINIC_OR_DEPARTMENT_OTHER): Payer: BC Managed Care – PPO | Admitting: Hematology and Oncology

## 2021-01-06 ENCOUNTER — Inpatient Hospital Stay: Payer: BC Managed Care – PPO

## 2021-01-06 ENCOUNTER — Encounter: Payer: Self-pay | Admitting: Hematology and Oncology

## 2021-01-06 VITALS — BP 122/75 | HR 87 | Temp 97.0°F | Wt 235.9 lb

## 2021-01-06 VITALS — BP 117/77 | HR 88 | Resp 18

## 2021-01-06 DIAGNOSIS — D509 Iron deficiency anemia, unspecified: Secondary | ICD-10-CM

## 2021-01-06 DIAGNOSIS — E538 Deficiency of other specified B group vitamins: Secondary | ICD-10-CM | POA: Diagnosis not present

## 2021-01-06 MED ORDER — IRON SUCROSE 20 MG/ML IV SOLN
200.0000 mg | Freq: Once | INTRAVENOUS | Status: AC
  Administered 2021-01-06: 200 mg via INTRAVENOUS
  Filled 2021-01-06: qty 10

## 2021-01-06 MED ORDER — SODIUM CHLORIDE 0.9 % IV SOLN: 200.0000 mg | Freq: Once | INTRAVENOUS | Status: DC

## 2021-01-06 MED ORDER — SODIUM CHLORIDE 0.9 % IV SOLN
Freq: Once | INTRAVENOUS | Status: AC
  Filled 2021-01-06: qty 250

## 2021-01-06 NOTE — Progress Notes (Signed)
Patient reports loss of hair, headaches, arthritis pain, fatigue. SOB, lightheadedness on exertion as well

## 2021-01-06 NOTE — Progress Notes (Signed)
Pt received prescribed treatment in clinic, pt stable at d/c. 

## 2021-01-13 ENCOUNTER — Inpatient Hospital Stay: Payer: BC Managed Care – PPO

## 2021-01-13 ENCOUNTER — Other Ambulatory Visit: Payer: Self-pay

## 2021-01-13 VITALS — BP 99/71 | HR 82 | Temp 98.2°F | Resp 18

## 2021-01-13 DIAGNOSIS — D509 Iron deficiency anemia, unspecified: Secondary | ICD-10-CM

## 2021-01-13 MED ORDER — SODIUM CHLORIDE 0.9 % IV SOLN
Freq: Once | INTRAVENOUS | Status: AC
  Filled 2021-01-13: qty 250

## 2021-01-13 MED ORDER — IRON SUCROSE 20 MG/ML IV SOLN
200.0000 mg | Freq: Once | INTRAVENOUS | Status: AC
  Administered 2021-01-13: 200 mg via INTRAVENOUS
  Filled 2021-01-13: qty 10

## 2021-01-13 MED ORDER — SODIUM CHLORIDE 0.9 % IV SOLN: 200.0000 mg | Freq: Once | INTRAVENOUS | Status: DC

## 2021-01-13 NOTE — Progress Notes (Signed)
Patient received prescribed treatment in clinic. Tolerated well. Patient stable at discharge. 

## 2021-01-20 ENCOUNTER — Inpatient Hospital Stay: Payer: BC Managed Care – PPO

## 2021-01-20 ENCOUNTER — Other Ambulatory Visit: Payer: Self-pay

## 2021-01-20 VITALS — BP 127/77 | HR 73 | Resp 18

## 2021-01-20 DIAGNOSIS — D509 Iron deficiency anemia, unspecified: Secondary | ICD-10-CM

## 2021-01-20 MED ORDER — SODIUM CHLORIDE 0.9 % IV SOLN
Freq: Once | INTRAVENOUS | Status: AC
  Filled 2021-01-20: qty 250

## 2021-01-20 MED ORDER — SODIUM CHLORIDE 0.9 % IV SOLN: 200.0000 mg | Freq: Once | INTRAVENOUS | Status: DC

## 2021-01-20 MED ORDER — IRON SUCROSE 20 MG/ML IV SOLN
200.0000 mg | Freq: Once | INTRAVENOUS | Status: AC
  Administered 2021-01-20: 200 mg via INTRAVENOUS
  Filled 2021-01-20: qty 10

## 2021-01-20 NOTE — Progress Notes (Signed)
Pt received prescribed treatment in clinic, pt stable at d/c. 

## 2021-01-27 ENCOUNTER — Inpatient Hospital Stay: Payer: BC Managed Care – PPO

## 2021-01-27 ENCOUNTER — Other Ambulatory Visit: Payer: Self-pay

## 2021-01-27 VITALS — BP 108/76 | HR 76 | Resp 18

## 2021-01-27 DIAGNOSIS — D509 Iron deficiency anemia, unspecified: Secondary | ICD-10-CM

## 2021-01-27 MED ORDER — SODIUM CHLORIDE 0.9 % IV SOLN: 200.0000 mg | Freq: Once | INTRAVENOUS | Status: DC

## 2021-01-27 MED ORDER — SODIUM CHLORIDE 0.9 % IV SOLN
Freq: Once | INTRAVENOUS | Status: AC
  Filled 2021-01-27: qty 250

## 2021-01-27 MED ORDER — IRON SUCROSE 20 MG/ML IV SOLN
200.0000 mg | Freq: Once | INTRAVENOUS | Status: AC
  Administered 2021-01-27: 200 mg via INTRAVENOUS
  Filled 2021-01-27: qty 10

## 2021-02-03 ENCOUNTER — Inpatient Hospital Stay: Payer: BC Managed Care – PPO

## 2021-02-03 ENCOUNTER — Other Ambulatory Visit: Payer: Self-pay

## 2021-02-03 VITALS — BP 112/72 | HR 73 | Temp 95.5°F | Resp 18

## 2021-02-03 DIAGNOSIS — D509 Iron deficiency anemia, unspecified: Secondary | ICD-10-CM | POA: Diagnosis not present

## 2021-02-03 MED ORDER — SODIUM CHLORIDE 0.9 % IV SOLN: 200.0000 mg | Freq: Once | INTRAVENOUS | Status: DC

## 2021-02-03 MED ORDER — IRON SUCROSE 20 MG/ML IV SOLN
200.0000 mg | Freq: Once | INTRAVENOUS | Status: AC
  Administered 2021-02-03: 200 mg via INTRAVENOUS
  Filled 2021-02-03: qty 10

## 2021-02-03 MED ORDER — SODIUM CHLORIDE 0.9 % IV SOLN
Freq: Once | INTRAVENOUS | Status: AC
  Filled 2021-02-03: qty 250

## 2021-03-03 ENCOUNTER — Other Ambulatory Visit: Payer: Self-pay

## 2021-03-03 DIAGNOSIS — D509 Iron deficiency anemia, unspecified: Secondary | ICD-10-CM

## 2021-03-03 DIAGNOSIS — E538 Deficiency of other specified B group vitamins: Secondary | ICD-10-CM

## 2021-03-08 ENCOUNTER — Other Ambulatory Visit: Payer: BC Managed Care – PPO

## 2021-03-24 ENCOUNTER — Inpatient Hospital Stay: Payer: BC Managed Care – PPO | Attending: Hematology and Oncology

## 2021-03-24 ENCOUNTER — Other Ambulatory Visit: Payer: Self-pay

## 2021-03-24 DIAGNOSIS — D509 Iron deficiency anemia, unspecified: Secondary | ICD-10-CM | POA: Insufficient documentation

## 2021-03-24 LAB — CBC
HCT: 37.2 % (ref 36.0–46.0)
Hemoglobin: 11.5 g/dL — ABNORMAL LOW (ref 12.0–15.0)
MCH: 24.3 pg — ABNORMAL LOW (ref 26.0–34.0)
MCHC: 30.9 g/dL (ref 30.0–36.0)
MCV: 78.6 fL — ABNORMAL LOW (ref 80.0–100.0)
Platelets: 325 10*3/uL (ref 150–400)
RBC: 4.73 MIL/uL (ref 3.87–5.11)
RDW: 20.2 % — ABNORMAL HIGH (ref 11.5–15.5)
WBC: 9 10*3/uL (ref 4.0–10.5)
nRBC: 0 % (ref 0.0–0.2)

## 2021-03-24 LAB — FERRITIN: Ferritin: 74 ng/mL (ref 11–307)

## 2021-05-11 ENCOUNTER — Other Ambulatory Visit: Payer: BC Managed Care – PPO

## 2021-05-12 ENCOUNTER — Ambulatory Visit: Payer: BC Managed Care – PPO

## 2021-05-12 ENCOUNTER — Ambulatory Visit: Payer: BC Managed Care – PPO | Admitting: Hematology and Oncology

## 2021-05-12 ENCOUNTER — Other Ambulatory Visit: Payer: BC Managed Care – PPO

## 2021-05-18 ENCOUNTER — Inpatient Hospital Stay: Payer: BC Managed Care – PPO | Attending: Internal Medicine

## 2021-05-18 ENCOUNTER — Other Ambulatory Visit: Payer: Self-pay

## 2021-05-18 ENCOUNTER — Ambulatory Visit: Payer: BC Managed Care – PPO

## 2021-05-18 DIAGNOSIS — Z820 Family history of epilepsy and other diseases of the nervous system: Secondary | ICD-10-CM | POA: Diagnosis not present

## 2021-05-18 DIAGNOSIS — D509 Iron deficiency anemia, unspecified: Secondary | ICD-10-CM | POA: Diagnosis present

## 2021-05-18 DIAGNOSIS — Z832 Family history of diseases of the blood and blood-forming organs and certain disorders involving the immune mechanism: Secondary | ICD-10-CM | POA: Insufficient documentation

## 2021-05-18 DIAGNOSIS — Z8042 Family history of malignant neoplasm of prostate: Secondary | ICD-10-CM | POA: Diagnosis not present

## 2021-05-18 DIAGNOSIS — Z811 Family history of alcohol abuse and dependence: Secondary | ICD-10-CM | POA: Diagnosis not present

## 2021-05-18 DIAGNOSIS — Z8379 Family history of other diseases of the digestive system: Secondary | ICD-10-CM | POA: Insufficient documentation

## 2021-05-18 DIAGNOSIS — Z8249 Family history of ischemic heart disease and other diseases of the circulatory system: Secondary | ICD-10-CM | POA: Insufficient documentation

## 2021-05-18 DIAGNOSIS — Z79899 Other long term (current) drug therapy: Secondary | ICD-10-CM | POA: Diagnosis not present

## 2021-05-18 DIAGNOSIS — R5383 Other fatigue: Secondary | ICD-10-CM | POA: Diagnosis not present

## 2021-05-18 DIAGNOSIS — M1990 Unspecified osteoarthritis, unspecified site: Secondary | ICD-10-CM | POA: Insufficient documentation

## 2021-05-18 DIAGNOSIS — Z823 Family history of stroke: Secondary | ICD-10-CM | POA: Insufficient documentation

## 2021-05-18 DIAGNOSIS — E785 Hyperlipidemia, unspecified: Secondary | ICD-10-CM | POA: Insufficient documentation

## 2021-05-18 DIAGNOSIS — I10 Essential (primary) hypertension: Secondary | ICD-10-CM | POA: Insufficient documentation

## 2021-05-18 DIAGNOSIS — Z836 Family history of other diseases of the respiratory system: Secondary | ICD-10-CM | POA: Diagnosis not present

## 2021-05-18 DIAGNOSIS — J449 Chronic obstructive pulmonary disease, unspecified: Secondary | ICD-10-CM | POA: Diagnosis not present

## 2021-05-18 DIAGNOSIS — Z818 Family history of other mental and behavioral disorders: Secondary | ICD-10-CM | POA: Diagnosis not present

## 2021-05-18 DIAGNOSIS — E538 Deficiency of other specified B group vitamins: Secondary | ICD-10-CM

## 2021-05-18 DIAGNOSIS — E119 Type 2 diabetes mellitus without complications: Secondary | ICD-10-CM | POA: Insufficient documentation

## 2021-05-18 LAB — IRON AND TIBC
Iron: 31 ug/dL (ref 28–170)
Saturation Ratios: 7 % — ABNORMAL LOW (ref 10.4–31.8)
TIBC: 437 ug/dL (ref 250–450)
UIBC: 406 ug/dL

## 2021-05-18 LAB — CBC WITH DIFFERENTIAL/PLATELET
Abs Immature Granulocytes: 0.07 10*3/uL (ref 0.00–0.07)
Basophils Absolute: 0 10*3/uL (ref 0.0–0.1)
Basophils Relative: 0 %
Eosinophils Absolute: 0.2 10*3/uL (ref 0.0–0.5)
Eosinophils Relative: 4 %
HCT: 36.1 % (ref 36.0–46.0)
Hemoglobin: 11.1 g/dL — ABNORMAL LOW (ref 12.0–15.0)
Immature Granulocytes: 1 %
Lymphocytes Relative: 22 %
Lymphs Abs: 1.5 10*3/uL (ref 0.7–4.0)
MCH: 25.1 pg — ABNORMAL LOW (ref 26.0–34.0)
MCHC: 30.7 g/dL (ref 30.0–36.0)
MCV: 81.7 fL (ref 80.0–100.0)
Monocytes Absolute: 0.6 10*3/uL (ref 0.1–1.0)
Monocytes Relative: 9 %
Neutro Abs: 4.4 10*3/uL (ref 1.7–7.7)
Neutrophils Relative %: 64 %
Platelets: 342 10*3/uL (ref 150–400)
RBC: 4.42 MIL/uL (ref 3.87–5.11)
RDW: 17 % — ABNORMAL HIGH (ref 11.5–15.5)
WBC: 6.9 10*3/uL (ref 4.0–10.5)
nRBC: 0 % (ref 0.0–0.2)

## 2021-05-18 LAB — FERRITIN: Ferritin: 17 ng/mL (ref 11–307)

## 2021-05-19 ENCOUNTER — Ambulatory Visit: Payer: BC Managed Care – PPO

## 2021-05-19 ENCOUNTER — Encounter: Payer: Self-pay | Admitting: Oncology

## 2021-05-19 ENCOUNTER — Inpatient Hospital Stay (HOSPITAL_BASED_OUTPATIENT_CLINIC_OR_DEPARTMENT_OTHER): Payer: BC Managed Care – PPO | Admitting: Oncology

## 2021-05-19 ENCOUNTER — Inpatient Hospital Stay: Payer: BC Managed Care – PPO

## 2021-05-19 VITALS — BP 126/92 | HR 87 | Temp 98.0°F | Resp 18 | Wt 239.1 lb

## 2021-05-19 DIAGNOSIS — D509 Iron deficiency anemia, unspecified: Secondary | ICD-10-CM

## 2021-05-19 DIAGNOSIS — E538 Deficiency of other specified B group vitamins: Secondary | ICD-10-CM

## 2021-05-19 MED ORDER — SODIUM CHLORIDE 0.9 % IV SOLN: 200.0000 mg | Freq: Once | INTRAVENOUS | Status: DC

## 2021-05-19 MED ORDER — IRON SUCROSE 20 MG/ML IV SOLN
200.0000 mg | Freq: Once | INTRAVENOUS | Status: AC
Start: 2021-05-19 — End: 2021-05-19
  Administered 2021-05-19: 200 mg via INTRAVENOUS

## 2021-05-19 MED ORDER — SODIUM CHLORIDE 0.9 % IV SOLN
INTRAVENOUS | Status: DC | PRN
  Filled 2021-05-19: qty 250

## 2021-05-19 NOTE — Progress Notes (Signed)
Hematology/Oncology Consult note [facility]  Telephone:([phone] [phone]  Patient Care Team: Myrene Buddy, NP as PCP - General (Internal Medicine) Abe People as Physician Assistant Rosey Bath, MD as Referring Physician (Hematology and Oncology) Stanton Kidney, MD as Consulting Physician (Gastroenterology)   Name of the patient: Andrea Zhang  [id number]  Apr 06, 1961   Date of visit: 05/19/21  Diagnosis-iron deficiency anemia  Chief complaint/ Reason for visit-routine follow-up of iron deficiency anemia  Heme/Onc history: patient is a 60 year old female with a past medical history significant for hypertension hyperlipidemia, asthma diabetes osteoarthritis among other medical problems.  She also has chronic arthritis for which she sees Dr. Gavin Potters and has been getting joint injections.  She was seen by her PCP on 01/12/2018 with symptoms of lightheadedness and worsening shortness of breath.  She has been earlier seen by Dr. Lady Gary from cardiology on 12/19/2017 and underwent stress test and echocardiogram that was normal.  Blood work done on 01/12/2018 was as follows: CBC showed white count of 8.4, H&H of 6.7/22.6 with an MCV of 81 and a platelet count of 324.  Differential on the CBC was normal.  Iron study showed TIBC that was elevated at 546.  Percentage iron saturation was 36.  Ferritin levels were not checked.  CMP and TSH were within normal.  B12 levels in November 2018 were low at 241.  Upper endoscopy from July 27 showed a Schatzki's ring which was dilated and evidence of gastritis.  Duodenum was normal.  She has had 2-3 EGDs in the past for strictures requiring dilatation.  She has had colonoscopy back in 2011 which was apparently normal.   Results of blood work from 01/15/2018 were as follows: CBC showed white count of 7.4, H&H of 7.5/24 with an MCV of 78.2 and a platelet count of 299.  Ferritin levels were low at 7.   Iron studies showed a low iron saturation and elevated TIBC of 560 haptoglobin was normal, celiac disease panel was negative, myeloma panel revealed no monoclonal protein.  B12 level was low low at 210.  Reticulocyte count was elevated at 7.5 indicating response to anemia.  Folate level was normal at 7.6.  Urinalysis did not reveal any hematuria.  Stool H. pylori antigen was negative   Patient was seen by [facility] and underwent EGD and colonoscopy in May 2019 which was apparently unremarkable.  Patient has not had a capsule endoscopy done yet.  She received 2 doses of Feraheme in March 2019 as well as 3 doses of B12.  Repeat CBC from 02/05/2018 showed H&H of 9.6/30.8.  Patient states that she also underwent capsule endoscopy which did not show any evidence of bleeding.  Etiology of iron deficiency anemia unclear but patient has been requiring IV iron on a consistent basis  Interval history-denies any bleeding in her stool or urine.  Denies any dark melanotic stools  ECOG PS- 1 Pain scale-0  Review of systems- Review of Systems  Constitutional:  Positive for malaise/fatigue. Negative for chills, fever and weight loss.  HENT:  Negative for congestion, ear discharge and nosebleeds.   Eyes:  Negative for blurred vision.  Respiratory:  Negative for cough, hemoptysis, sputum production, shortness of breath and wheezing.   Cardiovascular:  Negative for chest pain, palpitations, orthopnea and claudication.  Gastrointestinal:  Negative for abdominal pain, blood in stool, constipation, diarrhea, heartburn, melena, nausea and vomiting.  Genitourinary:  Negative for dysuria, flank pain, frequency,  hematuria and urgency.  Musculoskeletal:  Negative for back pain, joint pain and myalgias.  Skin:  Negative for rash.  Neurological:  Negative for dizziness, tingling, focal weakness, seizures, weakness and headaches.  Endo/Heme/Allergies:  Does not bruise/bleed easily.  Psychiatric/Behavioral:  Negative  for depression and suicidal ideas. The patient does not have insomnia.       Allergies  Allergen Reactions   Iodinated Diagnostic Agents Other (See Comments)    Becomes 'unresponsive' to ORAL and IV DYE BETADINE ON THE SKIN IS OKAY unresponsive Patient could hear things but not responsive Becomes 'unresponsive' to ORAL and IV DYE BETADINE ON THE SKIN IS OKAY  Patient could hear things but not responsive Becomes 'unresponsive' to ORAL and IV DYE BETADINE ON THE SKIN IS OKAY Becomes 'unresponsive' to ORAL and IV DYE BETADINE ON THE SKIN IS OKAY unresponsive Patient could hear things but not responsiveBecomes 'unresponsive' to ORAL and IV DYE BETADINE ON THE SKIN IS OKAY   Iodine     Other reaction(s): Other (See Comments) Becomes 'unresponsive' to ORAL and IV DYE BETADINE ON THE SKIN IS OKAY unresponsive Patient could hear things but not responsive Becomes 'unresponsive' to ORAL and IV DYE BETADINE ON THE SKIN IS OKAY  Patient could hear things but not responsive Becomes 'unresponsive' to ORAL and IV DYE BETADINE ON THE SKIN IS OKAY Becomes 'unresponsive' to ORAL and IV DYE BETADINE ON THE SKIN IS OKAY unresponsive Patient could hear things but not responsiveBecomes 'unresponsive' to ORAL and IV DYE BETADINE ON THE SKIN IS OKAY   Diclofenac Hives    HORRIBLE RASH with both PATCH OR CREAM   Ibuprofen Itching   Zyrtec [Cetirizine] Other (See Comments)    HEADACHE    Cephalexin Rash   Maxalt [Rizatriptan Benzoate] Other (See Comments)    'Heart Races'   Orphenadrine Citrate Other (See Comments)    Patient unsure of this allergy.   Zithromax [Azithromycin] Other (See Comments)    Severe Abdominal Cramps   Zomig [Zolmitriptan] Other (See Comments)    'Heart Races'     Past Medical History:  Diagnosis Date   Anemia    iron deficiency and b12 deficiency   Anxiety    Arthritis    Asthma    Diabetes mellitus without complication (HCC)    Fasciitis    left foot    GERD (gastroesophageal reflux disease)    History of kidney stones    Hypercholesterolemia    Hypertension    Migraines    MIGRAINES HAVE IMPROVED SINCE RECEIVING IRON     Past Surgical History:  Procedure Laterality Date   CHOLECYSTECTOMY  2004   COLONOSCOPY WITH ESOPHAGOGASTRODUODENOSCOPY (EGD)  02/2018   ESOPHAGOGASTRODUODENOSCOPY N/A 10/16/2020   Procedure: ESOPHAGOGASTRODUODENOSCOPY (EGD);  Surgeon: Regis Bill, MD;  Location: [facility] ENDOSCOPY;  Service: Endoscopy;  Laterality: N/A;   ESOPHAGOGASTRODUODENOSCOPY (EGD) WITH PROPOFOL N/A 06/06/2016   Procedure: ESOPHAGOGASTRODUODENOSCOPY (EGD) WITH PROPOFOL;  Surgeon: Scot Jun, MD;  Location: [facility] ENDOSCOPY;  Service: Endoscopy;  Laterality: N/A;   EXTRACORPOREAL SHOCK WAVE LITHOTRIPSY  2010   JOINT REPLACEMENT  2013   LT TKR   SAVORY DILATION N/A 06/06/2016   Procedure: SAVORY DILATION;  Surgeon: Scot Jun, MD;  Location: [facility] ENDOSCOPY;  Service: Endoscopy;  Laterality: N/A;   SAVORY DILATION  02/2018   SHOULDER ARTHROSCOPY WITH OPEN ROTATOR CUFF REPAIR Right 05/24/2018   Procedure: right shoulder arthroscopy, extensive arthroscopic debridement, decompression, open rotator cuff repair, biceps tenodesis;  Surgeon: Christena Flake,  MD;  Location: [facility] ORS;  Service: Orthopedics;  Laterality: Right;    Social History   Socioeconomic History   Marital status: Married    Spouse name: Not on file   Number of children: Not on file   Years of education: Not on file   Highest education level: Not on file  Occupational History   Not on file  Tobacco Use   Smoking status: Never   Smokeless tobacco: Never  Vaping Use   Vaping Use: Never used  Substance and Sexual Activity   Alcohol use: No   Drug use: No   Sexual activity: Not Currently  Other Topics Concern   Not on file  Social History Narrative   Not on file   Social Determinants of Health   Financial Resource Strain: Not on file  Food Insecurity:  Not on file  Transportation Needs: Not on file  Physical Activity: Not on file  Stress: Not on file  Social Connections: Not on file  Intimate Partner Violence: Not on file    Family History  Problem Relation Age of Onset   COPD Mother    Heart disease Mother    Asthma Mother    Schizophrenia Mother    Hemophilia Father    Prostate cancer Father    Heart disease Father    Epilepsy Sister    Stroke Sister    Alcohol abuse Sister    Hypertension Sister    Epilepsy Brother    Lupus Daughter    Gallbladder disease Daughter    Breast cancer Neg Hx      Current Outpatient Medications:    albuterol (PROVENTIL HFA;VENTOLIN HFA) 108 (90 Base) MCG/ACT inhaler, Inhale 2 puffs into the lungs every 6 (six) hours as needed for wheezing or shortness of breath., Disp: , Rfl:    aspirin EC 81 MG tablet, Take 81 mg by mouth daily., Disp: , Rfl:    atenolol (TENORMIN) 50 MG tablet, Take 50 mg by mouth daily., Disp: , Rfl:    Cholecalciferol 2000 units CAPS, Take 2,000 Units by mouth daily., Disp: , Rfl:    cyclobenzaprine (FLEXERIL) 5 MG tablet, Take 5 mg by mouth 3 (three) times daily as needed for muscle spasms., Disp: , Rfl:    gabapentin (NEURONTIN) 800 MG tablet, Take 1 tablet by mouth daily., Disp: , Rfl:    Melatonin 10 MG TABS, Take 1 tablet by mouth at bedtime as needed., Disp: , Rfl:    mirtazapine (REMERON) 15 MG tablet, Take 15 mg by mouth at bedtime. , Disp: , Rfl:    Multiple Vitamin (MULTI-VITAMIN) tablet, Take 1 tablet by mouth daily., Disp: , Rfl:    naratriptan (AMERGE) 2.5 MG tablet, Take 2.5 mg by mouth as needed for migraine. Take one (1) tablet at onset of headache; if returns or does not resolve, may repeat after 4 hours; do not exceed five (5) mg in 24 hours., Disp: , Rfl:    omeprazole (PRILOSEC) 40 MG capsule, Take 40 mg by mouth 2 (two) times daily., Disp: , Rfl:    pioglitazone-metformin (ACTOPLUS MET) 15-850 MG tablet, Take 1 tablet by mouth 2 (two) times daily with  a meal., Disp: , Rfl:    promethazine (PHENERGAN) 25 MG tablet, Take 25 mg by mouth every 6 (six) hours as needed for nausea or vomiting., Disp: , Rfl:    simvastatin (ZOCOR) 10 MG tablet, Take 10 mg by mouth daily., Disp: , Rfl:    SUMAtriptan (IMITREX) 100 MG tablet,  Take by mouth., Disp: , Rfl:    vitamin B-12 (CYANOCOBALAMIN) 1000 MCG tablet, Take 1,000 mcg by mouth daily., Disp: , Rfl:    cetirizine (ZYRTEC) 10 MG tablet, Take 10 mg by mouth as needed for allergies. (Patient not taking: Reported on 05/19/2021), Disp: , Rfl:    hydrochlorothiazide (HYDRODIURIL) 12.5 MG tablet, Take 12.5 mg by mouth as needed (fluid). (Patient not taking: No sig reported), Disp: , Rfl:  No current facility-administered medications for this visit.  Facility-Administered Medications Ordered in Other Visits:    0.9 %  sodium chloride infusion, , Intravenous, PRN, Creig Hines, MD, Stopped at 05/19/21 1144  Physical exam:  Vitals:   05/19/21 1059  BP: (!) 126/92  Pulse: 87  Resp: 18  Temp: 98 F (36.7 C)  SpO2: 99%  Weight: 239 lb 1.4 oz (108.4 kg)   Physical Exam Cardiovascular:     Rate and Rhythm: Normal rate and regular rhythm.     Heart sounds: Normal heart sounds.  Pulmonary:     Effort: Pulmonary effort is normal.     Breath sounds: Normal breath sounds.  Abdominal:     General: Bowel sounds are normal.     Palpations: Abdomen is soft.  Skin:    General: Skin is warm and dry.  Neurological:     Mental Status: She is alert and oriented to person, place, and time.     CMP Latest Ref Rng & Units 08/21/2019  Glucose 70 - 99 mg/dL 016(W)  BUN 6 - 20 mg/dL 14  Creatinine 1.09 - 3.23 mg/dL 5.57  Sodium 322 - 025 mmol/L 139  Potassium 3.5 - 5.1 mmol/L 3.6  Chloride 98 - 111 mmol/L 101  CO2 22 - 32 mmol/L 29  Calcium 8.9 - 10.3 mg/dL 10.4(H)  Total Protein 6.5 - 8.1 g/dL 7.4  Total Bilirubin 0.3 - 1.2 mg/dL 0.8  Alkaline Phos 38 - 126 U/L 77  AST 15 - 41 U/L 16  ALT 0 - 44 U/L 18    CBC Latest Ref Rng & Units 05/18/2021  WBC 4.0 - 10.5 K/uL 6.9  Hemoglobin 12.0 - 15.0 g/dL 11.1(L)  Hematocrit 36.0 - 46.0 % 36.1  Platelets 150 - 400 K/uL 342    No images are attached to the encounter.  No results found.   Assessment and plan- Patient is a 60 y.o. female with iron deficiency anemia of unclear etiology here for routine follow-up  Patient's hemoglobin is 11 today with of iron saturation of 7% and ferritin levels of 17 consistent with iron deficiency.  Patient has not tolerated oral iron in the past and will therefore proceed with 5 doses of Venofer starting today.  Repeat CBC ferritin and iron studies in 2 in 4months and I will see her back in 4 months   Visit Diagnosis 1. Iron deficiency anemia, unspecified iron deficiency anemia type      Dr. Owens Shark, MD, MPH [facility] [phone] 05/19/2021 2:52 PM

## 2021-05-21 ENCOUNTER — Inpatient Hospital Stay: Payer: BC Managed Care – PPO

## 2021-05-21 ENCOUNTER — Other Ambulatory Visit: Payer: Self-pay

## 2021-05-21 VITALS — BP 110/74 | HR 78 | Temp 98.2°F | Resp 18

## 2021-05-21 DIAGNOSIS — D509 Iron deficiency anemia, unspecified: Secondary | ICD-10-CM

## 2021-05-21 MED ORDER — SODIUM CHLORIDE 0.9 % IV SOLN
INTRAVENOUS | Status: DC | PRN
  Filled 2021-05-21: qty 250

## 2021-05-21 MED ORDER — IRON SUCROSE 20 MG/ML IV SOLN
200.0000 mg | Freq: Once | INTRAVENOUS | Status: AC
  Administered 2021-05-21: 200 mg via INTRAVENOUS
  Filled 2021-05-21: qty 10

## 2021-05-21 MED ORDER — SODIUM CHLORIDE 0.9 % IV SOLN: 200.0000 mg | Freq: Once | INTRAVENOUS | Status: DC

## 2021-05-25 ENCOUNTER — Other Ambulatory Visit: Payer: Self-pay

## 2021-05-25 ENCOUNTER — Inpatient Hospital Stay: Payer: BC Managed Care – PPO

## 2021-05-25 VITALS — BP 94/66 | HR 80 | Temp 96.9°F | Resp 18

## 2021-05-25 DIAGNOSIS — D509 Iron deficiency anemia, unspecified: Secondary | ICD-10-CM | POA: Diagnosis not present

## 2021-05-25 MED ORDER — SODIUM CHLORIDE 0.9 % IV SOLN
Freq: Once | INTRAVENOUS | Status: AC
  Filled 2021-05-25: qty 250

## 2021-05-25 MED ORDER — SODIUM CHLORIDE 0.9 % IV SOLN: 200.0000 mg | Freq: Once | INTRAVENOUS | Status: DC

## 2021-05-25 MED ORDER — IRON SUCROSE 20 MG/ML IV SOLN
200.0000 mg | Freq: Once | INTRAVENOUS | Status: AC
  Administered 2021-05-25: 200 mg via INTRAVENOUS
  Filled 2021-05-25: qty 10

## 2021-05-27 ENCOUNTER — Inpatient Hospital Stay: Payer: BC Managed Care – PPO

## 2021-05-27 ENCOUNTER — Other Ambulatory Visit: Payer: Self-pay

## 2021-05-27 VITALS — BP 123/58 | HR 80 | Temp 97.7°F | Resp 18

## 2021-05-27 DIAGNOSIS — D509 Iron deficiency anemia, unspecified: Secondary | ICD-10-CM

## 2021-05-27 MED ORDER — IRON SUCROSE 20 MG/ML IV SOLN
200.0000 mg | Freq: Once | INTRAVENOUS | Status: AC
  Administered 2021-05-27: 200 mg via INTRAVENOUS
  Filled 2021-05-27: qty 10

## 2021-05-27 MED ORDER — SODIUM CHLORIDE 0.9 % IV SOLN
Freq: Once | INTRAVENOUS | Status: AC
  Filled 2021-05-27: qty 250

## 2021-05-27 MED ORDER — SODIUM CHLORIDE 0.9 % IV SOLN: 200.0000 mg | Freq: Once | INTRAVENOUS | Status: DC

## 2021-05-31 IMAGING — CR DG ABDOMEN 1V
1 series · 3 of 3 positions shown · non-contrast
Comparison: None.

CLINICAL DATA: Constipation for 2 weeks.  Bloating.

EXAM:
ABDOMEN - 1 VIEW

[Series 1: dg abd 1 view · 0.14mm/px · 3 of 3 slices shown]
[im 1/3]
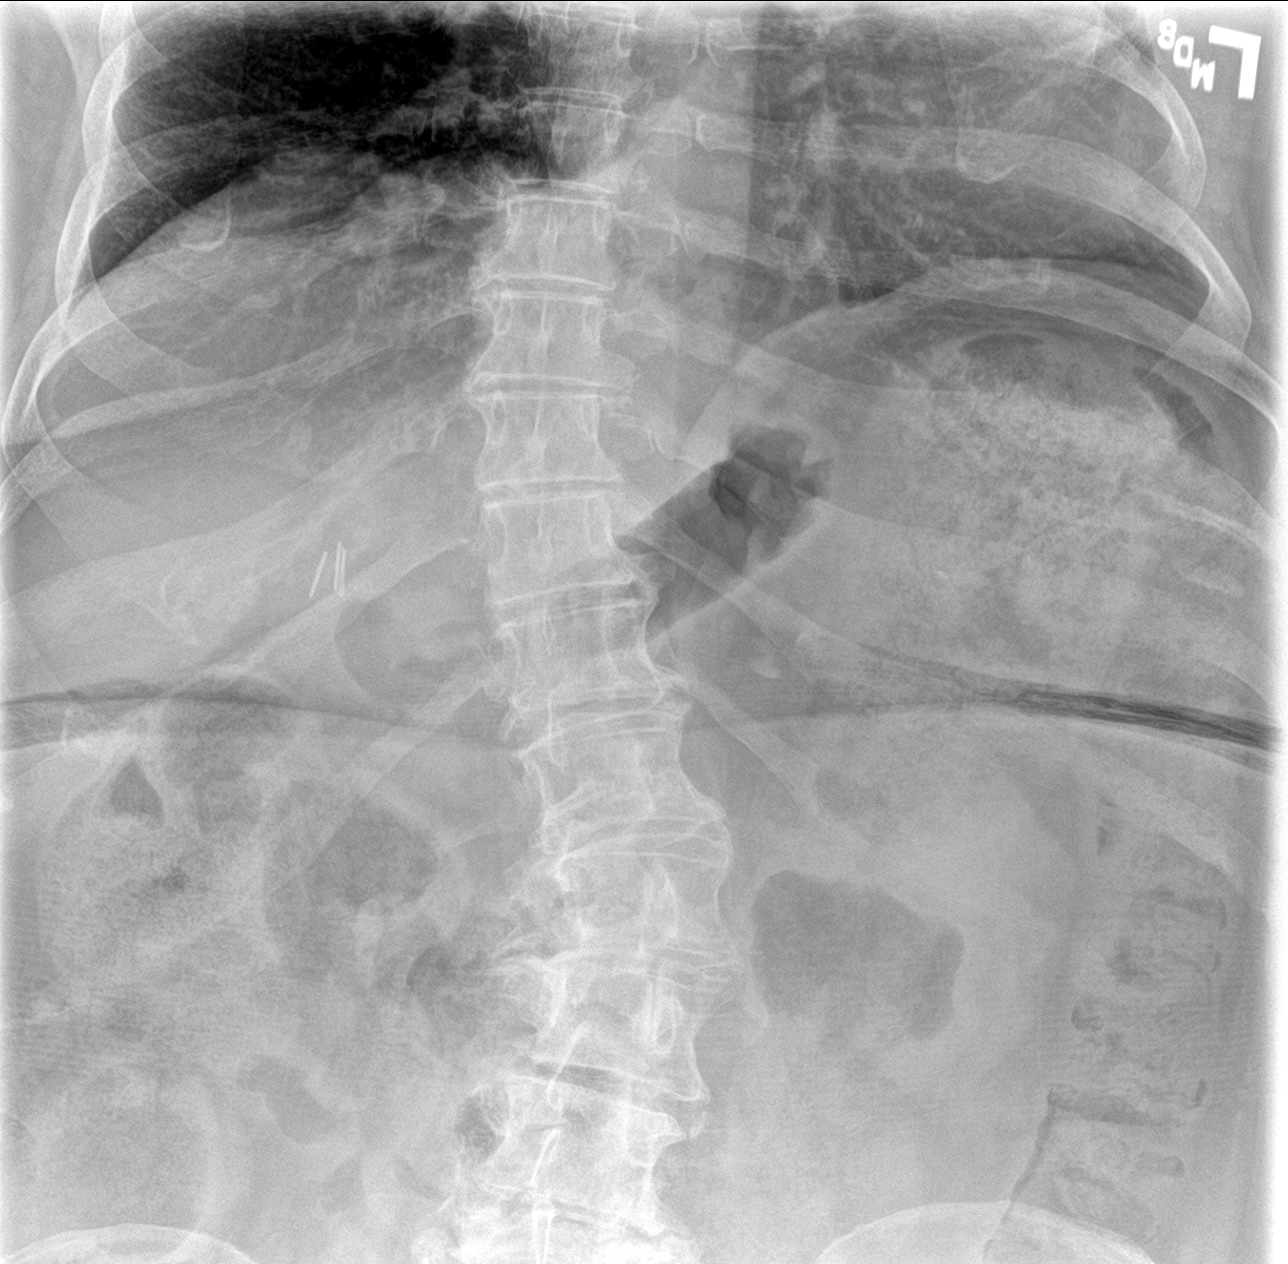
[im 2/3]
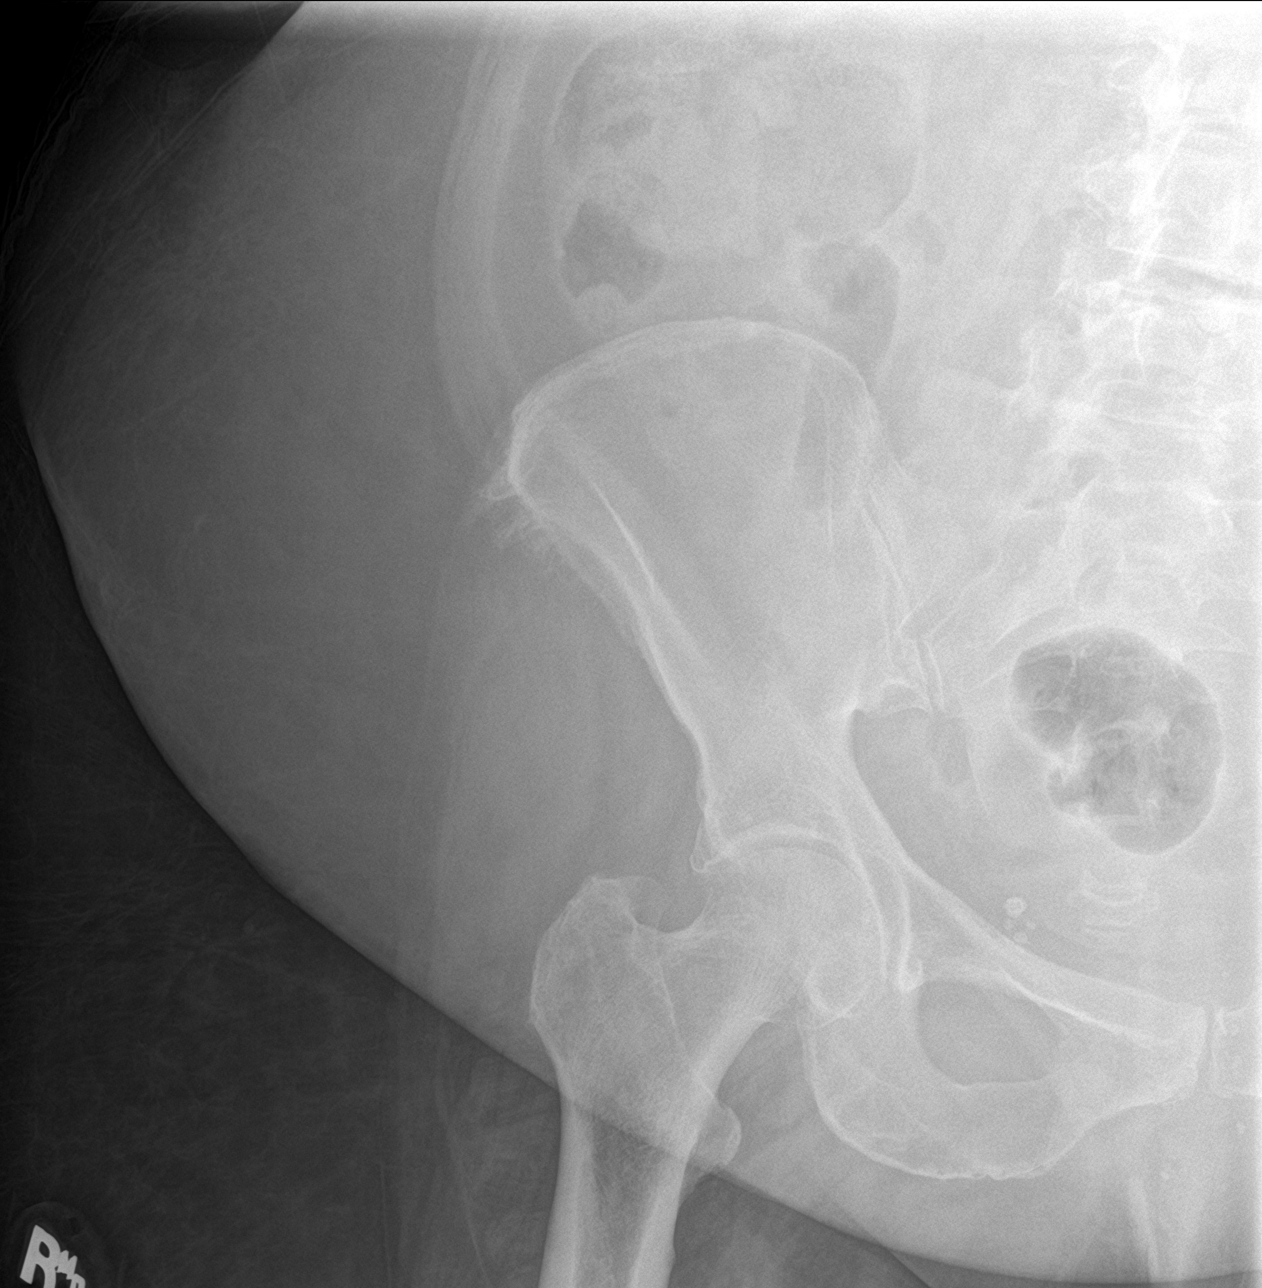
[im 3/3]
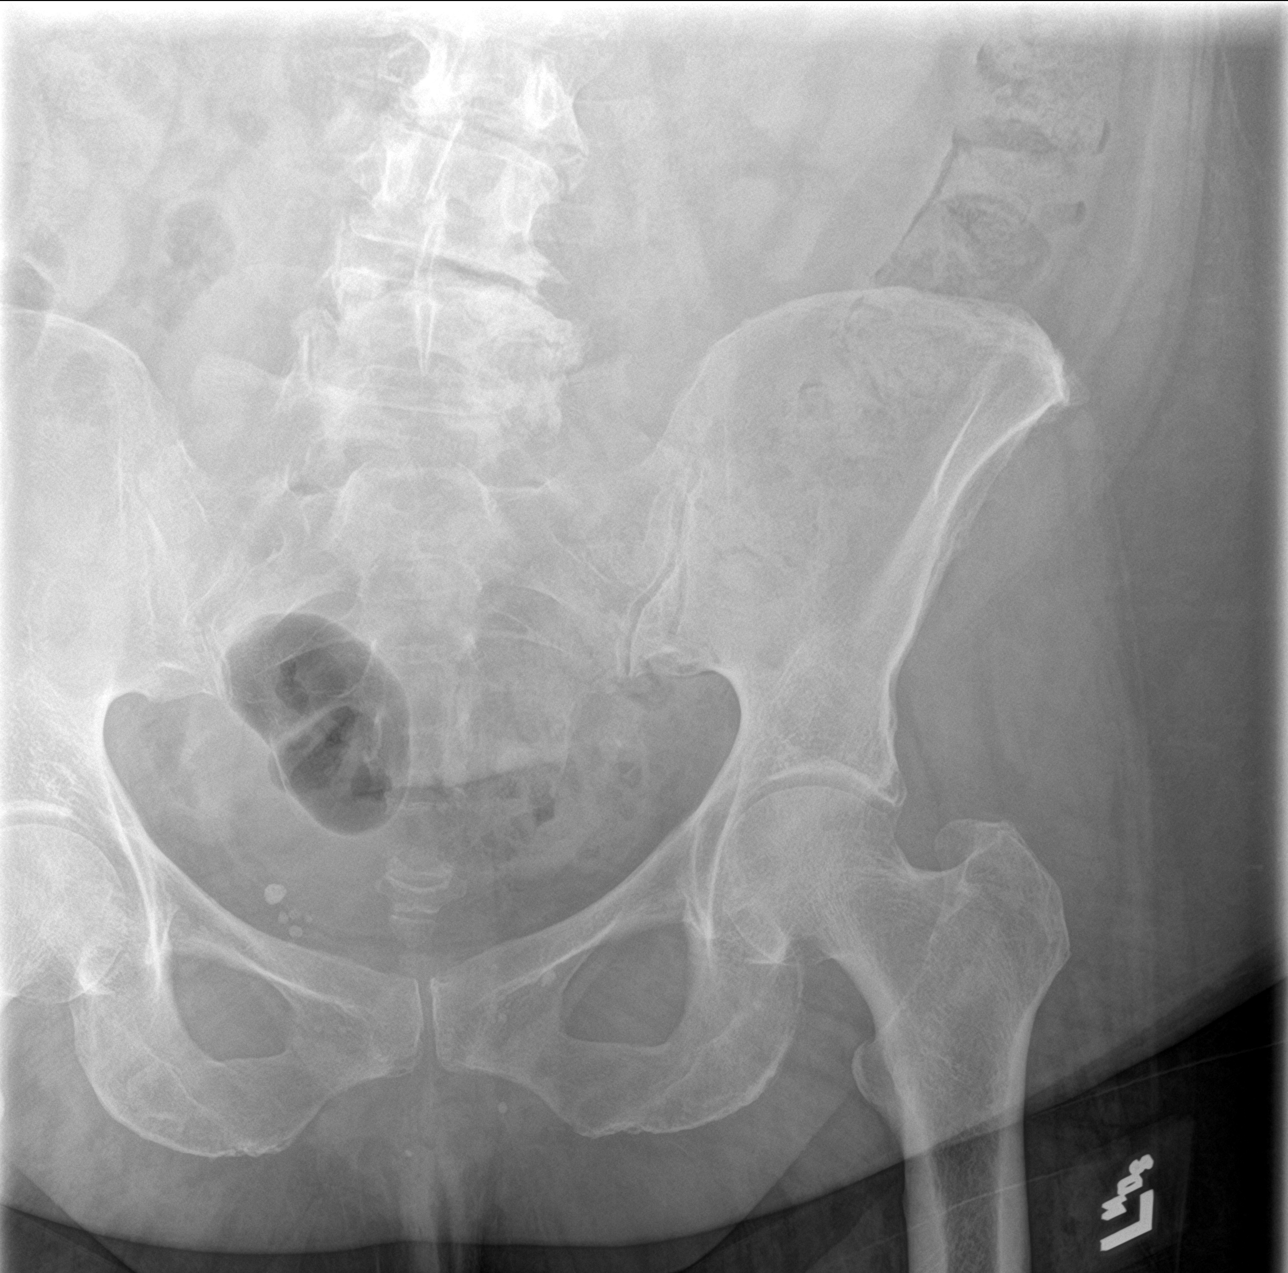

[3 of 3 positions shown; findings below may reference images not displayed]

FINDINGS: Moderate stool is present throughout the colon. There is no
obstruction. Small bowel gas pattern is within normal limits.
Degenerative changes are present in the lumbar spine with scoliosis.
Degenerative changes of the hips are worse on the right. Surgical
clips are present at the gallbladder fossa. Heart is enlarged.
IMPRESSION: 1. Moderate stool throughout the colon without obstruction.
2. Normal small gas pattern.
3. Degenerative changes of the lumbar spine and right hip.

## 2021-06-01 ENCOUNTER — Other Ambulatory Visit: Payer: Self-pay

## 2021-06-01 ENCOUNTER — Inpatient Hospital Stay: Payer: BC Managed Care – PPO

## 2021-06-01 VITALS — BP 110/73 | HR 78 | Temp 97.4°F | Resp 18

## 2021-06-01 DIAGNOSIS — D509 Iron deficiency anemia, unspecified: Secondary | ICD-10-CM | POA: Diagnosis not present

## 2021-06-01 MED ORDER — SODIUM CHLORIDE 0.9 % IV SOLN
Freq: Once | INTRAVENOUS | Status: AC
  Filled 2021-06-01: qty 250

## 2021-06-01 MED ORDER — SODIUM CHLORIDE 0.9 % IV SOLN: 200.0000 mg | Freq: Once | INTRAVENOUS | Status: DC

## 2021-06-01 MED ORDER — IRON SUCROSE 20 MG/ML IV SOLN
200.0000 mg | Freq: Once | INTRAVENOUS | Status: AC
  Administered 2021-06-01: 200 mg via INTRAVENOUS
  Filled 2021-06-01: qty 10

## 2021-06-09 ENCOUNTER — Telehealth: Payer: Self-pay | Admitting: *Deleted

## 2021-06-09 ENCOUNTER — Inpatient Hospital Stay: Payer: BC Managed Care – PPO | Attending: Oncology

## 2021-06-09 ENCOUNTER — Telehealth: Payer: Self-pay | Admitting: Oncology

## 2021-06-09 DIAGNOSIS — D509 Iron deficiency anemia, unspecified: Secondary | ICD-10-CM | POA: Insufficient documentation

## 2021-06-09 LAB — CBC
HCT: 37.7 % (ref 36.0–46.0)
Hemoglobin: 11.7 g/dL — ABNORMAL LOW (ref 12.0–15.0)
MCH: 26.2 pg (ref 26.0–34.0)
MCHC: 31 g/dL (ref 30.0–36.0)
MCV: 84.5 fL (ref 80.0–100.0)
Platelets: 366 10*3/uL (ref 150–400)
RBC: 4.46 MIL/uL (ref 3.87–5.11)
RDW: 19.5 % — ABNORMAL HIGH (ref 11.5–15.5)
WBC: 6.3 10*3/uL (ref 4.0–10.5)
nRBC: 0 % (ref 0.0–0.2)

## 2021-06-09 LAB — IRON AND TIBC
Iron: 58 ug/dL (ref 28–170)
Saturation Ratios: 15 % (ref 10.4–31.8)
TIBC: 384 ug/dL (ref 250–450)
UIBC: 326 ug/dL

## 2021-06-09 LAB — FERRITIN: Ferritin: 224 ng/mL (ref 11–307)

## 2021-06-09 NOTE — Telephone Encounter (Signed)
Ok to come for cbc alone. Cannot check iron levels so soon

## 2021-06-09 NOTE — Telephone Encounter (Signed)
I called to let pt know that her hgb 11.7 so she does not need any blood transfusion. She is thankful for the call

## 2021-06-09 NOTE — Telephone Encounter (Signed)
Pt called and stated that she feels worse after her last few rounds of infusions. She would like to come in and give some blood work. Please advise.

## 2021-07-19 ENCOUNTER — Other Ambulatory Visit: Payer: Self-pay | Admitting: Nurse Practitioner

## 2021-07-19 DIAGNOSIS — Z1231 Encounter for screening mammogram for malignant neoplasm of breast: Secondary | ICD-10-CM

## 2021-07-20 ENCOUNTER — Other Ambulatory Visit: Payer: Self-pay

## 2021-07-20 ENCOUNTER — Inpatient Hospital Stay: Payer: BC Managed Care – PPO | Attending: Oncology

## 2021-07-20 DIAGNOSIS — D509 Iron deficiency anemia, unspecified: Secondary | ICD-10-CM | POA: Insufficient documentation

## 2021-07-20 LAB — CBC
HCT: 37.5 % (ref 36.0–46.0)
Hemoglobin: 11.9 g/dL — ABNORMAL LOW (ref 12.0–15.0)
MCH: 26.9 pg (ref 26.0–34.0)
MCHC: 31.7 g/dL (ref 30.0–36.0)
MCV: 84.7 fL (ref 80.0–100.0)
Platelets: 363 10*3/uL (ref 150–400)
RBC: 4.43 MIL/uL (ref 3.87–5.11)
RDW: 16.6 % — ABNORMAL HIGH (ref 11.5–15.5)
WBC: 7.5 10*3/uL (ref 4.0–10.5)
nRBC: 0 % (ref 0.0–0.2)

## 2021-07-20 LAB — IRON AND TIBC
Iron: 33 ug/dL (ref 28–170)
Saturation Ratios: 9 % — ABNORMAL LOW (ref 10.4–31.8)
TIBC: 377 ug/dL (ref 250–450)
UIBC: 344 ug/dL

## 2021-07-20 LAB — FERRITIN: Ferritin: 87 ng/mL (ref 11–307)

## 2021-07-22 ENCOUNTER — Encounter: Payer: Self-pay | Admitting: Oncology

## 2021-07-22 ENCOUNTER — Telehealth: Payer: Self-pay | Admitting: *Deleted

## 2021-07-22 ENCOUNTER — Other Ambulatory Visit: Payer: Self-pay | Admitting: Oncology

## 2021-07-22 NOTE — Telephone Encounter (Signed)
-----   Message from Creig Hines, MD sent at 07/21/2021 11:46 AM EDT ----- Hb stable. Ferritin normal. Iron saturation is low. We could consider doing IV iron or waiting for next bloodwork. What is patient preference?

## 2021-07-22 NOTE — Telephone Encounter (Signed)
Called pt and let her know about the lab values and she does want venofer but her work is busy and she is off the first 2 weeks of October- so she would like the iron to be given during that time. I will let scheduler know to make appts in October. Pt agreeable

## 2021-08-10 ENCOUNTER — Inpatient Hospital Stay: Payer: BC Managed Care – PPO | Attending: Oncology

## 2021-08-10 ENCOUNTER — Other Ambulatory Visit: Payer: Self-pay

## 2021-08-10 VITALS — BP 116/67 | HR 70 | Temp 96.0°F | Resp 18

## 2021-08-10 DIAGNOSIS — D509 Iron deficiency anemia, unspecified: Secondary | ICD-10-CM | POA: Insufficient documentation

## 2021-08-10 DIAGNOSIS — R5383 Other fatigue: Secondary | ICD-10-CM | POA: Insufficient documentation

## 2021-08-10 DIAGNOSIS — Z79899 Other long term (current) drug therapy: Secondary | ICD-10-CM | POA: Diagnosis not present

## 2021-08-10 MED ORDER — SODIUM CHLORIDE 0.9 % IV SOLN
Freq: Once | INTRAVENOUS | Status: AC
  Filled 2021-08-10: qty 250

## 2021-08-10 MED ORDER — SODIUM CHLORIDE 0.9 % IV SOLN: 200.0000 mg | INTRAVENOUS | Status: DC

## 2021-08-10 MED ORDER — IRON SUCROSE 20 MG/ML IV SOLN
200.0000 mg | Freq: Once | INTRAVENOUS | Status: AC
  Administered 2021-08-10: 200 mg via INTRAVENOUS
  Filled 2021-08-10: qty 10

## 2021-08-12 ENCOUNTER — Inpatient Hospital Stay: Payer: BC Managed Care – PPO

## 2021-08-12 ENCOUNTER — Other Ambulatory Visit: Payer: Self-pay

## 2021-08-12 VITALS — BP 115/82 | HR 72 | Temp 98.4°F | Resp 18

## 2021-08-12 DIAGNOSIS — D509 Iron deficiency anemia, unspecified: Secondary | ICD-10-CM | POA: Diagnosis not present

## 2021-08-12 MED ORDER — SODIUM CHLORIDE 0.9 % IV SOLN
Freq: Once | INTRAVENOUS | Status: AC
  Filled 2021-08-12: qty 250

## 2021-08-12 MED ORDER — IRON SUCROSE 20 MG/ML IV SOLN
200.0000 mg | Freq: Once | INTRAVENOUS | Status: AC
  Administered 2021-08-12: 200 mg via INTRAVENOUS
  Filled 2021-08-12: qty 10

## 2021-08-12 MED ORDER — SODIUM CHLORIDE 0.9 % IV SOLN: 200.0000 mg | INTRAVENOUS | Status: DC

## 2021-08-17 ENCOUNTER — Inpatient Hospital Stay: Payer: BC Managed Care – PPO

## 2021-08-17 ENCOUNTER — Other Ambulatory Visit: Payer: Self-pay

## 2021-08-17 VITALS — BP 117/73 | HR 76 | Resp 18

## 2021-08-17 DIAGNOSIS — D509 Iron deficiency anemia, unspecified: Secondary | ICD-10-CM

## 2021-08-17 MED ORDER — IRON SUCROSE 20 MG/ML IV SOLN
200.0000 mg | Freq: Once | INTRAVENOUS | Status: AC
  Administered 2021-08-17: 200 mg via INTRAVENOUS
  Filled 2021-08-17: qty 10

## 2021-08-17 MED ORDER — SODIUM CHLORIDE 0.9 % IV SOLN: 200.0000 mg | INTRAVENOUS | Status: DC

## 2021-08-17 MED ORDER — SODIUM CHLORIDE 0.9 % IV SOLN
INTRAVENOUS | Status: DC
  Filled 2021-08-17 (×2): qty 250

## 2021-08-19 ENCOUNTER — Other Ambulatory Visit: Payer: Self-pay

## 2021-08-19 ENCOUNTER — Inpatient Hospital Stay: Payer: BC Managed Care – PPO

## 2021-08-19 ENCOUNTER — Encounter: Payer: Self-pay | Admitting: Oncology

## 2021-08-19 VITALS — BP 121/70 | HR 79 | Temp 97.6°F | Resp 18

## 2021-08-19 DIAGNOSIS — D509 Iron deficiency anemia, unspecified: Secondary | ICD-10-CM

## 2021-08-19 MED ORDER — SODIUM CHLORIDE 0.9 % IV SOLN
Freq: Once | INTRAVENOUS | Status: AC
  Filled 2021-08-19: qty 250

## 2021-08-19 MED ORDER — IRON SUCROSE 20 MG/ML IV SOLN
200.0000 mg | Freq: Once | INTRAVENOUS | Status: AC
  Administered 2021-08-19: 200 mg via INTRAVENOUS
  Filled 2021-08-19: qty 10

## 2021-08-19 MED ORDER — SODIUM CHLORIDE 0.9 % IV SOLN: 200.0000 mg | INTRAVENOUS | Status: DC

## 2021-08-24 ENCOUNTER — Inpatient Hospital Stay: Payer: BC Managed Care – PPO

## 2021-08-24 ENCOUNTER — Other Ambulatory Visit: Payer: Self-pay

## 2021-08-24 ENCOUNTER — Encounter: Payer: Self-pay | Admitting: Oncology

## 2021-08-24 VITALS — BP 131/80 | HR 82 | Temp 96.7°F | Resp 18

## 2021-08-24 DIAGNOSIS — D509 Iron deficiency anemia, unspecified: Secondary | ICD-10-CM | POA: Diagnosis not present

## 2021-08-24 MED ORDER — SODIUM CHLORIDE 0.9 % IV SOLN: 200.0000 mg | INTRAVENOUS | Status: DC

## 2021-08-24 MED ORDER — SODIUM CHLORIDE 0.9 % IV SOLN
INTRAVENOUS | Status: DC
  Filled 2021-08-24: qty 250

## 2021-08-24 MED ORDER — IRON SUCROSE 20 MG/ML IV SOLN
200.0000 mg | Freq: Once | INTRAVENOUS | Status: AC
  Administered 2021-08-24: 200 mg via INTRAVENOUS

## 2021-09-08 ENCOUNTER — Other Ambulatory Visit: Payer: Self-pay

## 2021-09-08 ENCOUNTER — Ambulatory Visit
Admission: RE | Admit: 2021-09-08 | Discharge: 2021-09-08 | Disposition: A | Discharge status: Home / Self Care | Payer: BC Managed Care – PPO | Source: Ambulatory Visit | Attending: Nurse Practitioner | Admitting: Nurse Practitioner

## 2021-09-08 DIAGNOSIS — Z1231 Encounter for screening mammogram for malignant neoplasm of breast: Secondary | ICD-10-CM | POA: Insufficient documentation

## 2021-09-20 ENCOUNTER — Other Ambulatory Visit: Payer: Self-pay

## 2021-09-20 DIAGNOSIS — D509 Iron deficiency anemia, unspecified: Secondary | ICD-10-CM

## 2021-09-21 ENCOUNTER — Other Ambulatory Visit: Payer: Self-pay

## 2021-09-21 ENCOUNTER — Inpatient Hospital Stay: Payer: BC Managed Care – PPO | Attending: Oncology

## 2021-09-21 DIAGNOSIS — R5383 Other fatigue: Secondary | ICD-10-CM | POA: Insufficient documentation

## 2021-09-21 DIAGNOSIS — Z9049 Acquired absence of other specified parts of digestive tract: Secondary | ICD-10-CM | POA: Insufficient documentation

## 2021-09-21 DIAGNOSIS — Z823 Family history of stroke: Secondary | ICD-10-CM | POA: Diagnosis not present

## 2021-09-21 DIAGNOSIS — Z8379 Family history of other diseases of the digestive system: Secondary | ICD-10-CM | POA: Insufficient documentation

## 2021-09-21 DIAGNOSIS — J45909 Unspecified asthma, uncomplicated: Secondary | ICD-10-CM | POA: Insufficient documentation

## 2021-09-21 DIAGNOSIS — M1990 Unspecified osteoarthritis, unspecified site: Secondary | ICD-10-CM | POA: Insufficient documentation

## 2021-09-21 DIAGNOSIS — R0602 Shortness of breath: Secondary | ICD-10-CM | POA: Diagnosis not present

## 2021-09-21 DIAGNOSIS — Z836 Family history of other diseases of the respiratory system: Secondary | ICD-10-CM | POA: Insufficient documentation

## 2021-09-21 DIAGNOSIS — Z87442 Personal history of urinary calculi: Secondary | ICD-10-CM | POA: Insufficient documentation

## 2021-09-21 DIAGNOSIS — Z79899 Other long term (current) drug therapy: Secondary | ICD-10-CM | POA: Insufficient documentation

## 2021-09-21 DIAGNOSIS — E119 Type 2 diabetes mellitus without complications: Secondary | ICD-10-CM | POA: Insufficient documentation

## 2021-09-21 DIAGNOSIS — Z881 Allergy status to other antibiotic agents status: Secondary | ICD-10-CM | POA: Insufficient documentation

## 2021-09-21 DIAGNOSIS — I10 Essential (primary) hypertension: Secondary | ICD-10-CM | POA: Diagnosis not present

## 2021-09-21 DIAGNOSIS — Z820 Family history of epilepsy and other diseases of the nervous system: Secondary | ICD-10-CM | POA: Insufficient documentation

## 2021-09-21 DIAGNOSIS — Z832 Family history of diseases of the blood and blood-forming organs and certain disorders involving the immune mechanism: Secondary | ICD-10-CM | POA: Insufficient documentation

## 2021-09-21 DIAGNOSIS — E785 Hyperlipidemia, unspecified: Secondary | ICD-10-CM | POA: Diagnosis not present

## 2021-09-21 DIAGNOSIS — Z8042 Family history of malignant neoplasm of prostate: Secondary | ICD-10-CM | POA: Diagnosis not present

## 2021-09-21 DIAGNOSIS — Z818 Family history of other mental and behavioral disorders: Secondary | ICD-10-CM | POA: Diagnosis not present

## 2021-09-21 DIAGNOSIS — D509 Iron deficiency anemia, unspecified: Secondary | ICD-10-CM | POA: Diagnosis not present

## 2021-09-21 DIAGNOSIS — Z888 Allergy status to other drugs, medicaments and biological substances status: Secondary | ICD-10-CM | POA: Diagnosis not present

## 2021-09-21 DIAGNOSIS — Z8249 Family history of ischemic heart disease and other diseases of the circulatory system: Secondary | ICD-10-CM | POA: Diagnosis not present

## 2021-09-21 DIAGNOSIS — Z886 Allergy status to analgesic agent status: Secondary | ICD-10-CM | POA: Insufficient documentation

## 2021-09-21 LAB — CBC WITH DIFFERENTIAL/PLATELET
Abs Immature Granulocytes: 0.07 10*3/uL (ref 0.00–0.07)
Basophils Absolute: 0 10*3/uL (ref 0.0–0.1)
Basophils Relative: 1 %
Eosinophils Absolute: 0.2 10*3/uL (ref 0.0–0.5)
Eosinophils Relative: 3 %
HCT: 41.9 % (ref 36.0–46.0)
Hemoglobin: 13.1 g/dL (ref 12.0–15.0)
Immature Granulocytes: 1 %
Lymphocytes Relative: 16 %
Lymphs Abs: 1.2 10*3/uL (ref 0.7–4.0)
MCH: 28.1 pg (ref 26.0–34.0)
MCHC: 31.3 g/dL (ref 30.0–36.0)
MCV: 89.7 fL (ref 80.0–100.0)
Monocytes Absolute: 0.6 10*3/uL (ref 0.1–1.0)
Monocytes Relative: 8 %
Neutro Abs: 5.3 10*3/uL (ref 1.7–7.7)
Neutrophils Relative %: 71 %
Platelets: 318 10*3/uL (ref 150–400)
RBC: 4.67 MIL/uL (ref 3.87–5.11)
RDW: 16 % — ABNORMAL HIGH (ref 11.5–15.5)
WBC: 7.3 10*3/uL (ref 4.0–10.5)
nRBC: 0 % (ref 0.0–0.2)

## 2021-09-21 LAB — IRON AND TIBC
Iron: 53 ug/dL (ref 28–170)
Saturation Ratios: 15 % (ref 10.4–31.8)
TIBC: 360 ug/dL (ref 250–450)
UIBC: 307 ug/dL

## 2021-09-21 LAB — FERRITIN: Ferritin: 255 ng/mL (ref 11–307)

## 2021-09-22 ENCOUNTER — Inpatient Hospital Stay (HOSPITAL_BASED_OUTPATIENT_CLINIC_OR_DEPARTMENT_OTHER): Payer: BC Managed Care – PPO | Admitting: Oncology

## 2021-09-22 ENCOUNTER — Inpatient Hospital Stay: Payer: BC Managed Care – PPO

## 2021-09-22 ENCOUNTER — Ambulatory Visit: Payer: BC Managed Care – PPO | Admitting: Oncology

## 2021-09-22 ENCOUNTER — Ambulatory Visit: Payer: BC Managed Care – PPO

## 2021-09-22 ENCOUNTER — Encounter: Payer: Self-pay | Admitting: Oncology

## 2021-09-22 VITALS — BP 135/78 | HR 74 | Temp 98.5°F | Resp 18 | Wt 238.1 lb

## 2021-09-22 DIAGNOSIS — D509 Iron deficiency anemia, unspecified: Secondary | ICD-10-CM

## 2021-09-22 NOTE — Progress Notes (Signed)
Hematology/Oncology Consult note [facility]  Telephone:([phone] [phone]  Patient Care Team: Myrene Buddy, NP as PCP - General (Internal Medicine) Abe People as Physician Assistant Rosey Bath, MD (Inactive) as Referring Physician (Hematology and Oncology) Stanton Kidney, MD as Consulting Physician (Gastroenterology)   Name of the patient: Andrea Zhang  [id number]  02-19-1961   Date of visit: 09/22/21  Diagnosis-iron deficiency anemia  Chief complaint/ Reason for visit-routine follow-up of iron deficiency anemia  Heme/Onc history: patient is a 60 year old female with a past medical history significant for hypertension hyperlipidemia, asthma diabetes osteoarthritis among other medical problems.  She also has chronic arthritis for which she sees Dr. Gavin Potters and has been getting joint injections.  She was seen by her PCP on 01/12/2018 with symptoms of lightheadedness and worsening shortness of breath.  She has been earlier seen by Dr. Lady Gary from cardiology on 12/19/2017 and underwent stress test and echocardiogram that was normal.  Blood work done on 01/12/2018 was as follows: CBC showed white count of 8.4, H&H of 6.7/22.6 with an MCV of 81 and a platelet count of 324.  Differential on the CBC was normal.  Iron study showed TIBC that was elevated at 546.  Percentage iron saturation was 36.  Ferritin levels were not checked.  CMP and TSH were within normal.  B12 levels in November 2018 were low at 241.  Upper endoscopy from July 27 showed a Schatzki's ring which was dilated and evidence of gastritis.  Duodenum was normal.  She has had 2-3 EGDs in the past for strictures requiring dilatation.  She has had colonoscopy back in 2011 which was apparently normal.   Results of blood work from 01/15/2018 were as follows: CBC showed white count of 7.4, H&H of 7.5/24 with an MCV of 78.2 and a platelet count of 299.  Ferritin levels  were low at 7.  Iron studies showed a low iron saturation and elevated TIBC of 560 haptoglobin was normal, celiac disease panel was negative, myeloma panel revealed no monoclonal protein.  B12 level was low low at 210.  Reticulocyte count was elevated at 7.5 indicating response to anemia.  Folate level was normal at 7.6.  Urinalysis did not reveal any hematuria.  Stool H. pylori antigen was negative   Patient was seen by [facility] GI and underwent EGD and colonoscopy in May 2019 which was apparently unremarkable.  Patient has not had a capsule endoscopy done yet.  She received 2 doses of Feraheme in March 2019 as well as 3 doses of B12.  Repeat CBC from 02/05/2018 showed H&H of 9.6/30.8.  Patient states that she also underwent capsule endoscopy which did not show any evidence of bleeding.  Etiology of iron deficiency anemia unclear but patient has been requiring IV iron on a consistent basis  Interval history-reports feeling lightheaded when she bends down.  Has baseline fatigue.  Denies other complaints at this time  ECOG PS- 1 Pain scale- 0   Review of systems- Review of Systems  Constitutional:  Positive for malaise/fatigue. Negative for chills, fever and weight loss.  HENT:  Negative for congestion, ear discharge and nosebleeds.   Eyes:  Negative for blurred vision.  Respiratory:  Negative for cough, hemoptysis, sputum production, shortness of breath and wheezing.   Cardiovascular:  Negative for chest pain, palpitations, orthopnea and claudication.  Gastrointestinal:  Negative for abdominal pain, blood in stool, constipation, diarrhea, heartburn, melena, nausea and vomiting.  Genitourinary:  Negative for dysuria, flank pain, frequency, hematuria and urgency.  Musculoskeletal:  Negative for back pain, joint pain and myalgias.  Skin:  Negative for rash.  Neurological:  Negative for dizziness, tingling, focal weakness, seizures, weakness and headaches.  Endo/Heme/Allergies:  Does not  bruise/bleed easily.  Psychiatric/Behavioral:  Negative for depression and suicidal ideas. The patient does not have insomnia.      Allergies  Allergen Reactions   Iodinated Diagnostic Agents Other (See Comments)    Becomes 'unresponsive' to ORAL and IV DYE BETADINE ON THE SKIN IS OKAY unresponsive Patient could hear things but not responsive Becomes 'unresponsive' to ORAL and IV DYE BETADINE ON THE SKIN IS OKAY  Patient could hear things but not responsive Becomes 'unresponsive' to ORAL and IV DYE BETADINE ON THE SKIN IS OKAY Becomes 'unresponsive' to ORAL and IV DYE BETADINE ON THE SKIN IS OKAY unresponsive Patient could hear things but not responsiveBecomes 'unresponsive' to ORAL and IV DYE BETADINE ON THE SKIN IS OKAY   Iodine     Other reaction(s): Other (See Comments) Becomes 'unresponsive' to ORAL and IV DYE BETADINE ON THE SKIN IS OKAY unresponsive Patient could hear things but not responsive Becomes 'unresponsive' to ORAL and IV DYE BETADINE ON THE SKIN IS OKAY  Patient could hear things but not responsive Becomes 'unresponsive' to ORAL and IV DYE BETADINE ON THE SKIN IS OKAY Becomes 'unresponsive' to ORAL and IV DYE BETADINE ON THE SKIN IS OKAY unresponsive Patient could hear things but not responsiveBecomes 'unresponsive' to ORAL and IV DYE BETADINE ON THE SKIN IS OKAY   Diclofenac Hives    HORRIBLE RASH with both PATCH OR CREAM   Ibuprofen Itching   Zyrtec [Cetirizine] Other (See Comments)    HEADACHE    Cephalexin Rash   Maxalt [Rizatriptan Benzoate] Other (See Comments)    'Heart Races'   Orphenadrine Citrate Other (See Comments)    Patient unsure of this allergy.   Zithromax [Azithromycin] Other (See Comments)    Severe Abdominal Cramps   Zomig [Zolmitriptan] Other (See Comments)    'Heart Races'     Past Medical History:  Diagnosis Date   Anemia    iron deficiency and b12 deficiency   Anxiety    Arthritis    Asthma    Diabetes mellitus  without complication (HCC)    Fasciitis    left foot   GERD (gastroesophageal reflux disease)    History of kidney stones    Hypercholesterolemia    Hypertension    Migraines    MIGRAINES HAVE IMPROVED SINCE RECEIVING IRON     Past Surgical History:  Procedure Laterality Date   CHOLECYSTECTOMY  2004   COLONOSCOPY WITH ESOPHAGOGASTRODUODENOSCOPY (EGD)  02/2018   ESOPHAGOGASTRODUODENOSCOPY N/A 10/16/2020   Procedure: ESOPHAGOGASTRODUODENOSCOPY (EGD);  Surgeon: Regis Bill, MD;  Location: [facility] ENDOSCOPY;  Service: Endoscopy;  Laterality: N/A;   ESOPHAGOGASTRODUODENOSCOPY (EGD) WITH PROPOFOL N/A 06/06/2016   Procedure: ESOPHAGOGASTRODUODENOSCOPY (EGD) WITH PROPOFOL;  Surgeon: Scot Jun, MD;  Location: [facility] ENDOSCOPY;  Service: Endoscopy;  Laterality: N/A;   EXTRACORPOREAL SHOCK WAVE LITHOTRIPSY  2010   JOINT REPLACEMENT  2013   LT TKR   SAVORY DILATION N/A 06/06/2016   Procedure: SAVORY DILATION;  Surgeon: Scot Jun, MD;  Location: [facility] ENDOSCOPY;  Service: Endoscopy;  Laterality: N/A;   SAVORY DILATION  02/2018   SHOULDER ARTHROSCOPY WITH OPEN ROTATOR CUFF REPAIR Right 05/24/2018   Procedure: right shoulder arthroscopy, extensive arthroscopic debridement, decompression, open rotator cuff repair, biceps tenodesis;  Surgeon: Christena Flake, MD;  Location: [facility] ORS;  Service: Orthopedics;  Laterality: Right;    Social History   Socioeconomic History   Marital status: Married    Spouse name: Not on file   Number of children: Not on file   Years of education: Not on file   Highest education level: Not on file  Occupational History   Not on file  Tobacco Use   Smoking status: Never   Smokeless tobacco: Never  Vaping Use   Vaping Use: Never used  Substance and Sexual Activity   Alcohol use: No   Drug use: No   Sexual activity: Not Currently  Other Topics Concern   Not on file  Social History Narrative   Not on file   Social Determinants of Health    Financial Resource Strain: Not on file  Food Insecurity: Not on file  Transportation Needs: Not on file  Physical Activity: Not on file  Stress: Not on file  Social Connections: Not on file  Intimate Partner Violence: Not on file    Family History  Problem Relation Age of Onset   COPD Mother    Heart disease Mother    Asthma Mother    Schizophrenia Mother    Hemophilia Father    Prostate cancer Father    Heart disease Father    Epilepsy Sister    Stroke Sister    Alcohol abuse Sister    Hypertension Sister    Epilepsy Brother    Lupus Daughter    Gallbladder disease Daughter    Breast cancer Neg Hx      Current Outpatient Medications:    albuterol (PROVENTIL HFA;VENTOLIN HFA) 108 (90 Base) MCG/ACT inhaler, Inhale 2 puffs into the lungs every 6 (six) hours as needed for wheezing or shortness of breath., Disp: , Rfl:    aspirin EC 81 MG tablet, Take 81 mg by mouth daily., Disp: , Rfl:    atenolol (TENORMIN) 50 MG tablet, Take 50 mg by mouth daily., Disp: , Rfl:    Cholecalciferol 2000 units CAPS, Take 2,000 Units by mouth daily., Disp: , Rfl:    cyclobenzaprine (FLEXERIL) 5 MG tablet, Take 5 mg by mouth 3 (three) times daily as needed for muscle spasms., Disp: , Rfl:    gabapentin (NEURONTIN) 800 MG tablet, Take by mouth., Disp: , Rfl:    Melatonin 10 MG TABS, Take 1 tablet by mouth at bedtime as needed., Disp: , Rfl:    mirtazapine (REMERON) 15 MG tablet, Take 1 tablet by mouth at bedtime., Disp: , Rfl:    Multiple Vitamin (MULTI-VITAMIN) tablet, Take 1 tablet by mouth daily., Disp: , Rfl:    naratriptan (AMERGE) 2.5 MG tablet, Take 2.5 mg by mouth as needed for migraine. Take one (1) tablet at onset of headache; if returns or does not resolve, may repeat after 4 hours; do not exceed five (5) mg in 24 hours., Disp: , Rfl:    omeprazole (PRILOSEC) 40 MG capsule, Take 40 mg by mouth 2 (two) times daily., Disp: , Rfl:    pioglitazone-metformin (ACTOPLUS MET) 15-850 MG tablet,  Take 1 tablet by mouth 2 (two) times daily with a meal., Disp: , Rfl:    PREVIDENT 5000 BOOSTER PLUS 1.1 % PSTE, Place onto teeth., Disp: , Rfl:    promethazine (PHENERGAN) 25 MG tablet, Take by mouth., Disp: , Rfl:    simvastatin (ZOCOR) 10 MG tablet, Take 1 tablet by mouth at bedtime., Disp: , Rfl:  vitamin B-12 (CYANOCOBALAMIN) 1000 MCG tablet, Take 1,000 mcg by mouth daily., Disp: , Rfl:    cetirizine (ZYRTEC) 10 MG tablet, Take 10 mg by mouth as needed for allergies. (Patient not taking: No sig reported), Disp: , Rfl:    hydrochlorothiazide (HYDRODIURIL) 12.5 MG tablet, Take 12.5 mg by mouth as needed (fluid). (Patient not taking: No sig reported), Disp: , Rfl:    Melatonin 3 MG TBDP, Take by mouth. (Patient not taking: Reported on 09/22/2021), Disp: , Rfl:    SUMAtriptan (IMITREX) 100 MG tablet, Take by mouth. (Patient not taking: Reported on 09/22/2021), Disp: , Rfl:   Physical exam:  Vitals:   09/22/21 1303  BP: 135/78  Pulse: 74  Resp: 18  Temp: 98.5 F (36.9 C)  SpO2: 98%  Weight: 238 lb 1.6 oz (108 kg)   Physical Exam Constitutional:      General: She is not in acute distress. Cardiovascular:     Rate and Rhythm: Normal rate and regular rhythm.     Heart sounds: Normal heart sounds.  Pulmonary:     Effort: Pulmonary effort is normal.     Breath sounds: Normal breath sounds.  Abdominal:     General: Bowel sounds are normal.     Palpations: Abdomen is soft.  Skin:    General: Skin is warm and dry.  Neurological:     Mental Status: She is alert and oriented to person, place, and time.     CMP Latest Ref Rng & Units 08/21/2019  Glucose 70 - 99 mg/dL 409(W)  BUN 6 - 20 mg/dL 14  Creatinine 1.19 - 1.47 mg/dL 8.29  Sodium 562 - 130 mmol/L 139  Potassium 3.5 - 5.1 mmol/L 3.6  Chloride 98 - 111 mmol/L 101  CO2 22 - 32 mmol/L 29  Calcium 8.9 - 10.3 mg/dL 10.4(H)  Total Protein 6.5 - 8.1 g/dL 7.4  Total Bilirubin 0.3 - 1.2 mg/dL 0.8  Alkaline Phos 38 - 126 U/L 77   AST 15 - 41 U/L 16  ALT 0 - 44 U/L 18   CBC Latest Ref Rng & Units 09/21/2021  WBC 4.0 - 10.5 K/uL 7.3  Hemoglobin 12.0 - 15.0 g/dL 86.5  Hematocrit 78.4 - 46.0 % 41.9  Platelets 150 - 400 K/uL 318    No images are attached to the encounter.  MM 3D SCREEN BREAST BILATERAL  Result Date: 09/08/2021 CLINICAL DATA:  Screening. EXAM: DIGITAL SCREENING BILATERAL MAMMOGRAM WITH TOMOSYNTHESIS AND CAD TECHNIQUE: Bilateral screening digital craniocaudal and mediolateral oblique mammograms were obtained. Bilateral screening digital breast tomosynthesis was performed. The images were evaluated with computer-aided detection. COMPARISON:  Previous exam(s). ACR Breast Density Category b: There are scattered areas of fibroglandular density. FINDINGS: There are no findings suspicious for malignancy. IMPRESSION: No mammographic evidence of malignancy. A result letter of this screening mammogram will be mailed directly to the patient. RECOMMENDATION: Screening mammogram in one year. (Code:SM-B-01Y) BI-RADS CATEGORY  1: Negative. Electronically Signed   By: Emmaline Kluver M.D.   On: 09/08/2021 14:58    Assessment and plan- Patient is a 60 y.o. female with history of iron deficiency anemia here for routine follow-up  Patient received 5 doses of Venofer in October 2022.  Presently her ferritin levels are normal and iron saturation close to 20%.  H&H is normal at 13.1 and 41.9.  She does not require any IV iron at this time.  Patient is concerned that she will drop her iron levels fairly quickly and will end up  needing IV iron in the near future.  I will therefore check CBC ferritin and iron studies in 2 4 and 6 months and see her back in 6 months   Visit Diagnosis 1. Iron deficiency anemia, unspecified iron deficiency anemia type      Dr. Owens Shark, MD, MPH [facility] [phone] 09/22/2021 4:01 PM

## 2021-11-17 ENCOUNTER — Other Ambulatory Visit: Payer: Self-pay | Admitting: *Deleted

## 2021-11-17 DIAGNOSIS — D509 Iron deficiency anemia, unspecified: Secondary | ICD-10-CM

## 2021-11-22 ENCOUNTER — Other Ambulatory Visit: Payer: Self-pay

## 2021-11-22 ENCOUNTER — Inpatient Hospital Stay: Payer: BC Managed Care – PPO | Attending: Oncology

## 2021-11-22 DIAGNOSIS — D509 Iron deficiency anemia, unspecified: Secondary | ICD-10-CM | POA: Diagnosis not present

## 2021-11-22 LAB — IRON AND TIBC
Iron: 40 ug/dL (ref 28–170)
Saturation Ratios: 10 % — ABNORMAL LOW (ref 10.4–31.8)
TIBC: 417 ug/dL (ref 250–450)
UIBC: 377 ug/dL

## 2021-11-24 ENCOUNTER — Encounter: Payer: Self-pay | Admitting: Oncology

## 2021-11-25 ENCOUNTER — Other Ambulatory Visit: Payer: Self-pay | Admitting: *Deleted

## 2021-11-25 ENCOUNTER — Telehealth: Payer: Self-pay | Admitting: *Deleted

## 2021-11-25 DIAGNOSIS — D509 Iron deficiency anemia, unspecified: Secondary | ICD-10-CM

## 2021-11-25 NOTE — Telephone Encounter (Signed)
I made 2 phone notes encounter- this is error

## 2021-11-25 NOTE — Telephone Encounter (Signed)
I called patient to let her know that Dr. Donia Pounds does need a CBC and ferritin and because the other lab was done on 1/16 it is now too old to add the ferritin on.  Patient agreeable to come tomorrow to get her labs done and I have also sent her a envelope at the front desk for a $10 gift card to sheet because of the error that we had had.  Patient is agreeable with this plan orders are in

## 2021-11-26 ENCOUNTER — Inpatient Hospital Stay: Payer: BC Managed Care – PPO

## 2021-11-26 ENCOUNTER — Other Ambulatory Visit: Payer: Self-pay

## 2021-11-26 DIAGNOSIS — D509 Iron deficiency anemia, unspecified: Secondary | ICD-10-CM | POA: Diagnosis not present

## 2021-11-26 LAB — CBC
HCT: 38.6 % (ref 36.0–46.0)
Hemoglobin: 12 g/dL (ref 12.0–15.0)
MCH: 27.9 pg (ref 26.0–34.0)
MCHC: 31.1 g/dL (ref 30.0–36.0)
MCV: 89.8 fL (ref 80.0–100.0)
Platelets: 362 10*3/uL (ref 150–400)
RBC: 4.3 MIL/uL (ref 3.87–5.11)
RDW: 15 % (ref 11.5–15.5)
WBC: 5.9 10*3/uL (ref 4.0–10.5)
nRBC: 0 % (ref 0.0–0.2)

## 2021-11-26 LAB — FERRITIN: Ferritin: 48 ng/mL (ref 11–307)

## 2021-11-29 ENCOUNTER — Telehealth: Payer: Self-pay | Admitting: *Deleted

## 2021-11-29 NOTE — Progress Notes (Signed)
I called the pt and got her voicemail and let her know that ferritin 48 and if she is feeling symptomatic then we will give 2 doses of venofer and if you are not feeling symptomatic then we will check the labs at next visit. I asked her to call me or my chart her decision she wants to do and we will take care of it

## 2021-11-29 NOTE — Telephone Encounter (Signed)
Called the pt and left her a message that she has ferritin 48 and if she is symptomatic then she cna have 2 doses of venofer. If not then we will check  labs on next appt and go from there. I gave her my direct number and she can send my chart . Whichever is better for pt.

## 2021-12-01 NOTE — Progress Notes (Signed)
Called pt and she says that she does want the venofer x 2. She can start  next wed and then the next week after that. The pt. Says we can send her the appts on my chart

## 2021-12-08 ENCOUNTER — Other Ambulatory Visit: Payer: Self-pay

## 2021-12-08 ENCOUNTER — Inpatient Hospital Stay: Payer: BC Managed Care – PPO | Attending: Oncology

## 2021-12-08 VITALS — BP 105/60 | HR 97 | Temp 96.9°F | Resp 18

## 2021-12-08 DIAGNOSIS — D509 Iron deficiency anemia, unspecified: Secondary | ICD-10-CM | POA: Insufficient documentation

## 2021-12-08 MED ORDER — SODIUM CHLORIDE 0.9 % IV SOLN: 200.0000 mg | INTRAVENOUS | Status: DC

## 2021-12-08 MED ORDER — IRON SUCROSE 20 MG/ML IV SOLN
200.0000 mg | Freq: Once | INTRAVENOUS | Status: AC
  Administered 2021-12-08: 200 mg via INTRAVENOUS
  Filled 2021-12-08: qty 10

## 2021-12-08 NOTE — Patient Instructions (Signed)
[facility] [place]  Discharge Instructions: ?Thank you for choosing [facility] to provide your oncology and hematology care.  ?If you have a lab appointment with the Cancer Center, please go directly to the Cancer Center and check in at the registration area. ? ?Wear comfortable clothing and clothing appropriate for easy access to any Portacath or PICC line.  ? ?We strive to give you quality time with your provider. You may need to reschedule your appointment if you arrive late (15 or more minutes).  Arriving late affects you and other patients whose appointments are after yours.  Also, if you miss three or more appointments without notifying the office, you may be dismissed from the clinic at the provider?s discretion.    ?  ?For prescription refill requests, have your pharmacy contact our office and allow 72 hours for refills to be completed.   ? ?Today you received the following chemotherapy and/or immunotherapy agents VENOFER    ?  ?To help prevent nausea and vomiting after your treatment, we encourage you to take your nausea medication as directed. ? ?BELOW ARE SYMPTOMS THAT SHOULD BE REPORTED IMMEDIATELY: ?*FEVER GREATER THAN 100.4 F (38 ?C) OR HIGHER ?*CHILLS OR SWEATING ?*NAUSEA AND VOMITING THAT IS NOT CONTROLLED WITH YOUR NAUSEA MEDICATION ?*UNUSUAL SHORTNESS OF BREATH ?*UNUSUAL BRUISING OR BLEEDING ?*URINARY PROBLEMS (pain or burning when urinating, or frequent urination) ?*BOWEL PROBLEMS (unusual diarrhea, constipation, pain near the anus) ?TENDERNESS IN MOUTH AND THROAT WITH OR WITHOUT PRESENCE OF ULCERS (sore throat, sores in mouth, or a toothache) ?UNUSUAL RASH, SWELLING OR PAIN  ?UNUSUAL VAGINAL DISCHARGE OR ITCHING  ? ?Items with * indicate a potential emergency and should be followed up as soon as possible or go to the Emergency Department if any problems should occur. ? ?Please show the CHEMOTHERAPY ALERT CARD or IMMUNOTHERAPY ALERT CARD at check-in to the  Emergency Department and triage nurse. ? ?Should you have questions after your visit or need to cancel or reschedule your appointment, please contact [facility]  [phone] and follow the prompts.  Office hours are 8:00 a.m. to 4:30 p.m. Monday - Friday. Please note that voicemails left after 4:00 p.m. may not be returned until the following business day.  We are closed weekends and major holidays. You have access to a nurse at all times for urgent questions. Please call the main number to the clinic [phone] and follow the prompts. ? ?For any non-urgent questions, you may also contact your provider using MyChart. We now offer e-Visits for anyone 18 and older to request care online for non-urgent symptoms. For details visit [email or web address]. ?  ?Also download the MyChart app! Go to the app store, search "MyChart", open the app, select , and log in with your MyChart username and password. ? ?Due to Covid, a mask is required upon entering the hospital/clinic. If you do not have a mask, one will be given to you upon arrival. For doctor visits, patients may have 1 support person aged 18 or older with them. For treatment visits, patients cannot have anyone with them due to current Covid guidelines and our immunocompromised population.  ? ?Iron Sucrose Injection ?What is this medication? ?IRON SUCROSE (EYE ern SOO krose) treats low levels of iron (iron deficiency anemia) in people with kidney disease. Iron is a mineral that plays an important role in making red blood cells, which carry oxygen from your lungs to the rest of your body. ?This medicine may   be used for other purposes; ask your health care provider or pharmacist if you have questions. ?COMMON BRAND NAME(S): Venofer ?What should I tell my care team before I take this medication? ?They need to know if you have any of these conditions: ?Anemia not caused by low iron levels ?Heart disease ?High levels of  iron in the blood ?Kidney disease ?Liver disease ?An unusual or allergic reaction to iron, other medications, foods, dyes, or preservatives ?Pregnant or trying to get pregnant ?Breast-feeding ?How should I use this medication? ?This medication is for infusion into a vein. It is given in a hospital or clinic setting. ?Talk to your care team about the use of this medication in children. While this medication may be prescribed for children as young as 2 years for selected conditions, precautions do apply. ?Overdosage: If you think you have taken too much of this medicine contact a poison control center or emergency room at once. ?NOTE: This medicine is only for you. Do not share this medicine with others. ?What if I miss a dose? ?It is important not to miss your dose. Call your care team if you are unable to keep an appointment. ?What may interact with this medication? ?Do not take this medication with any of the following: ?Deferoxamine ?Dimercaprol ?Other iron products ?This medication may also interact with the following: ?Chloramphenicol ?Deferasirox ?This list may not describe all possible interactions. Give your health care provider a list of all the medicines, herbs, non-prescription drugs, or dietary supplements you use. Also tell them if you smoke, drink alcohol, or use illegal drugs. Some items may interact with your medicine. ?What should I watch for while using this medication? ?Visit your care team regularly. Tell your care team if your symptoms do not start to get better or if they get worse. You may need blood work done while you are taking this medication. ?You may need to follow a special diet. Talk to your care team. Foods that contain iron include: whole grains/cereals, dried fruits, beans, or peas, leafy green vegetables, and organ meats (liver, kidney). ?What side effects may I notice from receiving this medication? ?Side effects that you should report to your care team as soon as  possible: ?Allergic reactions--skin rash, itching, hives, swelling of the face, lips, tongue, or throat ?Low blood pressure--dizziness, feeling faint or lightheaded, blurry vision ?Shortness of breath ?Side effects that usually do not require medical attention (report to your care team if they continue or are bothersome): ?Flushing ?Headache ?Joint pain ?Muscle pain ?Nausea ?Pain, redness, or irritation at injection site ?This list may not describe all possible side effects. Call your doctor for medical advice about side effects. You may report side effects to FDA at 1-800-FDA-1088. ?Where should I keep my medication? ?This medication is given in a hospital or clinic and will not be stored at home. ?NOTE: This sheet is a summary. It may not cover all possible information. If you have questions about this medicine, talk to your doctor, pharmacist, or health care provider. ?? 2022 Elsevier/Gold Standard (2021-03-19 00:00:00) ? ?

## 2021-12-10 ENCOUNTER — Inpatient Hospital Stay: Payer: BC Managed Care – PPO

## 2021-12-10 ENCOUNTER — Other Ambulatory Visit: Payer: Self-pay

## 2021-12-10 VITALS — BP 119/77 | HR 98 | Temp 97.4°F

## 2021-12-10 DIAGNOSIS — D509 Iron deficiency anemia, unspecified: Secondary | ICD-10-CM

## 2021-12-10 MED ORDER — IRON SUCROSE 20 MG/ML IV SOLN
200.0000 mg | Freq: Once | INTRAVENOUS | Status: AC
  Administered 2021-12-10: 200 mg via INTRAVENOUS
  Filled 2021-12-10: qty 10

## 2021-12-10 MED ORDER — SODIUM CHLORIDE 0.9 % IV SOLN: 200.0000 mg | INTRAVENOUS | Status: DC

## 2021-12-10 MED ORDER — SODIUM CHLORIDE 0.9 % IV SOLN
Freq: Once | INTRAVENOUS | Status: AC
  Filled 2021-12-10: qty 250

## 2021-12-10 NOTE — Patient Instructions (Signed)

## 2021-12-23 ENCOUNTER — Other Ambulatory Visit: Payer: BC Managed Care – PPO

## 2022-01-19 ENCOUNTER — Other Ambulatory Visit: Payer: Self-pay

## 2022-01-19 ENCOUNTER — Inpatient Hospital Stay: Payer: BC Managed Care – PPO | Attending: Oncology

## 2022-01-19 DIAGNOSIS — D509 Iron deficiency anemia, unspecified: Secondary | ICD-10-CM | POA: Insufficient documentation

## 2022-01-19 DIAGNOSIS — Z79899 Other long term (current) drug therapy: Secondary | ICD-10-CM | POA: Diagnosis not present

## 2022-01-19 LAB — CBC WITH DIFFERENTIAL/PLATELET
Abs Immature Granulocytes: 0.07 10*3/uL (ref 0.00–0.07)
Basophils Absolute: 0.1 10*3/uL (ref 0.0–0.1)
Basophils Relative: 1 %
Eosinophils Absolute: 0.2 10*3/uL (ref 0.0–0.5)
Eosinophils Relative: 2 %
HCT: 39.2 % (ref 36.0–46.0)
Hemoglobin: 12 g/dL (ref 12.0–15.0)
Immature Granulocytes: 1 %
Lymphocytes Relative: 19 %
Lymphs Abs: 1.3 10*3/uL (ref 0.7–4.0)
MCH: 27 pg (ref 26.0–34.0)
MCHC: 30.6 g/dL (ref 30.0–36.0)
MCV: 88.1 fL (ref 80.0–100.0)
Monocytes Absolute: 0.6 10*3/uL (ref 0.1–1.0)
Monocytes Relative: 8 %
Neutro Abs: 4.9 10*3/uL (ref 1.7–7.7)
Neutrophils Relative %: 69 %
Platelets: 359 10*3/uL (ref 150–400)
RBC: 4.45 MIL/uL (ref 3.87–5.11)
RDW: 14.7 % (ref 11.5–15.5)
WBC: 7.1 10*3/uL (ref 4.0–10.5)
nRBC: 0 % (ref 0.0–0.2)

## 2022-01-19 LAB — IRON AND TIBC
Iron: 36 ug/dL (ref 28–170)
Saturation Ratios: 7 % — ABNORMAL LOW (ref 10.4–31.8)
TIBC: 486 ug/dL — ABNORMAL HIGH (ref 250–450)
UIBC: 450 ug/dL

## 2022-01-19 LAB — FERRITIN: Ferritin: 30 ng/mL (ref 11–307)

## 2022-01-24 ENCOUNTER — Other Ambulatory Visit: Payer: Self-pay | Admitting: Oncology

## 2022-01-24 ENCOUNTER — Telehealth: Payer: Self-pay | Admitting: *Deleted

## 2022-01-24 NOTE — Telephone Encounter (Signed)
Called pt and someone called her already today and she does want the iron infusions and jennifer set her up and she knows the dates of IV iron tx ?

## 2022-01-24 NOTE — Telephone Encounter (Signed)
-----   Message from Creig Hines, MD sent at 01/22/2022 10:59 AM EDT ----- ?Does she want iv iron? ?

## 2022-01-31 ENCOUNTER — Inpatient Hospital Stay: Payer: BC Managed Care – PPO

## 2022-01-31 ENCOUNTER — Other Ambulatory Visit: Payer: Self-pay

## 2022-01-31 VITALS — BP 149/85 | HR 96 | Temp 97.8°F

## 2022-01-31 DIAGNOSIS — D509 Iron deficiency anemia, unspecified: Secondary | ICD-10-CM

## 2022-01-31 MED ORDER — IRON SUCROSE 20 MG/ML IV SOLN
200.0000 mg | Freq: Once | INTRAVENOUS | Status: AC
  Administered 2022-01-31: 200 mg via INTRAVENOUS
  Filled 2022-01-31: qty 10

## 2022-01-31 MED ORDER — SODIUM CHLORIDE 0.9 % IV SOLN: 200.0000 mg | INTRAVENOUS | Status: DC

## 2022-01-31 MED ORDER — SODIUM CHLORIDE 0.9 % IV SOLN
INTRAVENOUS | Status: DC
  Filled 2022-01-31: qty 250

## 2022-01-31 NOTE — Patient Instructions (Signed)

## 2022-02-02 ENCOUNTER — Inpatient Hospital Stay: Payer: BC Managed Care – PPO

## 2022-02-02 VITALS — BP 132/79 | HR 72 | Temp 97.8°F | Resp 16

## 2022-02-02 DIAGNOSIS — D509 Iron deficiency anemia, unspecified: Secondary | ICD-10-CM | POA: Diagnosis not present

## 2022-02-02 MED ORDER — IRON SUCROSE 20 MG/ML IV SOLN
200.0000 mg | Freq: Once | INTRAVENOUS | Status: AC
  Administered 2022-02-02: 200 mg via INTRAVENOUS
  Filled 2022-02-02: qty 10

## 2022-02-02 MED ORDER — SODIUM CHLORIDE 0.9 % IV SOLN
INTRAVENOUS | Status: DC
  Filled 2022-02-02: qty 250

## 2022-02-02 MED ORDER — SODIUM CHLORIDE 0.9 % IV SOLN: 200.0000 mg | INTRAVENOUS | Status: DC

## 2022-02-07 ENCOUNTER — Inpatient Hospital Stay: Payer: BC Managed Care – PPO | Attending: Oncology

## 2022-02-07 VITALS — BP 119/74 | HR 85 | Temp 98.6°F | Resp 16

## 2022-02-07 DIAGNOSIS — D509 Iron deficiency anemia, unspecified: Secondary | ICD-10-CM | POA: Insufficient documentation

## 2022-02-07 DIAGNOSIS — Z79899 Other long term (current) drug therapy: Secondary | ICD-10-CM | POA: Insufficient documentation

## 2022-02-07 MED ORDER — SODIUM CHLORIDE 0.9 % IV SOLN: 200.0000 mg | INTRAVENOUS | Status: DC

## 2022-02-07 MED ORDER — IRON SUCROSE 20 MG/ML IV SOLN
200.0000 mg | Freq: Once | INTRAVENOUS | Status: AC
  Administered 2022-02-07: 200 mg via INTRAVENOUS
  Filled 2022-02-07: qty 10

## 2022-02-07 MED ORDER — SODIUM CHLORIDE 0.9 % IV SOLN
INTRAVENOUS | Status: DC
  Filled 2022-02-07: qty 250

## 2022-02-09 ENCOUNTER — Inpatient Hospital Stay: Payer: BC Managed Care – PPO

## 2022-02-09 VITALS — BP 109/76 | HR 83 | Temp 97.2°F | Resp 20

## 2022-02-09 DIAGNOSIS — D509 Iron deficiency anemia, unspecified: Secondary | ICD-10-CM | POA: Diagnosis not present

## 2022-02-09 MED ORDER — SODIUM CHLORIDE 0.9 % IV SOLN: 200.0000 mg | INTRAVENOUS | Status: DC

## 2022-02-09 MED ORDER — IRON SUCROSE 20 MG/ML IV SOLN
200.0000 mg | Freq: Once | INTRAVENOUS | Status: AC
  Administered 2022-02-09: 200 mg via INTRAVENOUS
  Filled 2022-02-09: qty 10

## 2022-02-09 MED ORDER — SODIUM CHLORIDE 0.9 % IV SOLN
INTRAVENOUS | Status: DC
  Filled 2022-02-09: qty 250

## 2022-02-09 NOTE — Patient Instructions (Signed)

## 2022-02-11 ENCOUNTER — Inpatient Hospital Stay: Payer: BC Managed Care – PPO

## 2022-02-11 VITALS — BP 107/61 | HR 79 | Temp 96.7°F | Resp 20

## 2022-02-11 DIAGNOSIS — D509 Iron deficiency anemia, unspecified: Secondary | ICD-10-CM

## 2022-02-11 MED ORDER — SODIUM CHLORIDE 0.9 % IV SOLN
INTRAVENOUS | Status: DC
  Filled 2022-02-11: qty 250

## 2022-02-11 MED ORDER — SODIUM CHLORIDE 0.9 % IV SOLN: 200.0000 mg | INTRAVENOUS | Status: DC

## 2022-02-11 MED ORDER — IRON SUCROSE 20 MG/ML IV SOLN
200.0000 mg | Freq: Once | INTRAVENOUS | Status: AC
  Administered 2022-02-11: 200 mg via INTRAVENOUS
  Filled 2022-02-11: qty 10

## 2022-03-17 ENCOUNTER — Inpatient Hospital Stay: Payer: BC Managed Care – PPO | Attending: Oncology

## 2022-03-17 DIAGNOSIS — Z823 Family history of stroke: Secondary | ICD-10-CM | POA: Insufficient documentation

## 2022-03-17 DIAGNOSIS — Z820 Family history of epilepsy and other diseases of the nervous system: Secondary | ICD-10-CM | POA: Diagnosis not present

## 2022-03-17 DIAGNOSIS — Z836 Family history of other diseases of the respiratory system: Secondary | ICD-10-CM | POA: Diagnosis not present

## 2022-03-17 DIAGNOSIS — Z87442 Personal history of urinary calculi: Secondary | ICD-10-CM | POA: Insufficient documentation

## 2022-03-17 DIAGNOSIS — Z881 Allergy status to other antibiotic agents status: Secondary | ICD-10-CM | POA: Diagnosis not present

## 2022-03-17 DIAGNOSIS — Z811 Family history of alcohol abuse and dependence: Secondary | ICD-10-CM | POA: Diagnosis not present

## 2022-03-17 DIAGNOSIS — Z832 Family history of diseases of the blood and blood-forming organs and certain disorders involving the immune mechanism: Secondary | ICD-10-CM | POA: Diagnosis not present

## 2022-03-17 DIAGNOSIS — Z8379 Family history of other diseases of the digestive system: Secondary | ICD-10-CM | POA: Insufficient documentation

## 2022-03-17 DIAGNOSIS — R5383 Other fatigue: Secondary | ICD-10-CM | POA: Diagnosis not present

## 2022-03-17 DIAGNOSIS — J45909 Unspecified asthma, uncomplicated: Secondary | ICD-10-CM | POA: Diagnosis not present

## 2022-03-17 DIAGNOSIS — I10 Essential (primary) hypertension: Secondary | ICD-10-CM | POA: Insufficient documentation

## 2022-03-17 DIAGNOSIS — E119 Type 2 diabetes mellitus without complications: Secondary | ICD-10-CM | POA: Insufficient documentation

## 2022-03-17 DIAGNOSIS — Z818 Family history of other mental and behavioral disorders: Secondary | ICD-10-CM | POA: Insufficient documentation

## 2022-03-17 DIAGNOSIS — Z9049 Acquired absence of other specified parts of digestive tract: Secondary | ICD-10-CM | POA: Diagnosis not present

## 2022-03-17 DIAGNOSIS — Z888 Allergy status to other drugs, medicaments and biological substances status: Secondary | ICD-10-CM | POA: Insufficient documentation

## 2022-03-17 DIAGNOSIS — Z886 Allergy status to analgesic agent status: Secondary | ICD-10-CM | POA: Diagnosis not present

## 2022-03-17 DIAGNOSIS — D509 Iron deficiency anemia, unspecified: Secondary | ICD-10-CM | POA: Diagnosis present

## 2022-03-17 DIAGNOSIS — M1990 Unspecified osteoarthritis, unspecified site: Secondary | ICD-10-CM | POA: Insufficient documentation

## 2022-03-17 DIAGNOSIS — Z8249 Family history of ischemic heart disease and other diseases of the circulatory system: Secondary | ICD-10-CM | POA: Insufficient documentation

## 2022-03-17 DIAGNOSIS — Z8042 Family history of malignant neoplasm of prostate: Secondary | ICD-10-CM | POA: Insufficient documentation

## 2022-03-17 DIAGNOSIS — Z79899 Other long term (current) drug therapy: Secondary | ICD-10-CM | POA: Diagnosis not present

## 2022-03-17 LAB — CBC WITH DIFFERENTIAL/PLATELET
Abs Immature Granulocytes: 0.05 10*3/uL (ref 0.00–0.07)
Basophils Absolute: 0 10*3/uL (ref 0.0–0.1)
Basophils Relative: 1 %
Eosinophils Absolute: 0.2 10*3/uL (ref 0.0–0.5)
Eosinophils Relative: 3 %
HCT: 42.7 % (ref 36.0–46.0)
Hemoglobin: 13.5 g/dL (ref 12.0–15.0)
Immature Granulocytes: 1 %
Lymphocytes Relative: 18 %
Lymphs Abs: 1.2 10*3/uL (ref 0.7–4.0)
MCH: 27.2 pg (ref 26.0–34.0)
MCHC: 31.6 g/dL (ref 30.0–36.0)
MCV: 86.1 fL (ref 80.0–100.0)
Monocytes Absolute: 0.5 10*3/uL (ref 0.1–1.0)
Monocytes Relative: 8 %
Neutro Abs: 4.6 10*3/uL (ref 1.7–7.7)
Neutrophils Relative %: 69 %
Platelets: 319 10*3/uL (ref 150–400)
RBC: 4.96 MIL/uL (ref 3.87–5.11)
RDW: 15.5 % (ref 11.5–15.5)
WBC: 6.6 10*3/uL (ref 4.0–10.5)
nRBC: 0 % (ref 0.0–0.2)

## 2022-03-17 LAB — FERRITIN: Ferritin: 205 ng/mL (ref 11–307)

## 2022-03-18 ENCOUNTER — Inpatient Hospital Stay: Payer: BC Managed Care – PPO

## 2022-03-22 ENCOUNTER — Inpatient Hospital Stay (HOSPITAL_BASED_OUTPATIENT_CLINIC_OR_DEPARTMENT_OTHER): Payer: BC Managed Care – PPO | Admitting: Oncology

## 2022-03-22 DIAGNOSIS — D509 Iron deficiency anemia, unspecified: Secondary | ICD-10-CM | POA: Diagnosis not present

## 2022-03-24 ENCOUNTER — Encounter: Payer: Self-pay | Admitting: Oncology

## 2022-03-24 NOTE — Progress Notes (Signed)
I connected with Andrea Zhang on 03/24/22 at  2:30 PM EDT by video enabled telemedicine visit and verified that I am speaking with the correct person using two identifiers.   I discussed the limitations, risks, security and privacy concerns of performing an evaluation and management service by telemedicine and the availability of in-person appointments. I also discussed with the patient that there may be a patient responsible charge related to this service. The patient expressed understanding and agreed to proceed.  Other persons participating in the visit and their role in the encounter:  none  Patient's location:  home Provider's location:  work  Risk analyst Complaint:  routine f/u of iron deficiency anemia  History of present illness:  patient is a 61 year old female with a past medical history significant for hypertension hyperlipidemia, asthma diabetes osteoarthritis among other medical problems.  She also has chronic arthritis for which she sees Dr. Jef[facility] and has been getting joint injections.  She was seen by her PCP on 01/12/2018 with symptoms of lightheadedness and worsening shortness of breath.  She has been earlier seen by Dr. Ubaldo Glassing from cardiology on 12/19/2017 and underwent stress test and echocardiogram that was normal.  Blood work done on 01/12/2018 was as follows: CBC showed white count of 8.4, H&H of 6.7/22.6 with an MCV of 81 and a platelet count of 324.  Differential on the CBC was normal.  Iron study showed TIBC that was elevated at 546.  Percentage iron saturation was 36.  Ferritin levels were not checked.  CMP and TSH were within normal.  B12 levels in November 2018 were low at 241.  Upper endoscopy from July 27 showed a Schatzki's ring which was dilated and evidence of gastritis.  Duodenum was normal.  She has had 2-3 EGDs in the past for strictures requiring dilatation.  She has had colonoscopy back in 2011 which was apparently normal.   Results of blood work from 01/15/2018 were as  follows: CBC showed white count of 7.4, H&H of 7.5/24 with an MCV of 78.2 and a platelet count of 299.  Ferritin levels were low at 7.  Iron studies showed a low iron saturation and elevated TIBC of 560 haptoglobin was normal, celiac disease panel was negative, myeloma panel revealed no monoclonal protein.  B12 level was low low at 210.  Reticulocyte count was elevated at 7.5 indicating response to anemia.  Folate level was normal at 7.6.  Urinalysis did not reveal any hematuria.  Stool H. pylori antigen was negative   [facility] and underwent EGD and colonoscopy in May 2019 which was apparently unremarkable.  Patient has not had a capsule endoscopy done yet.  She received 2 doses of Feraheme in March 2019 as well as 3 doses of B12.  Repeat CBC from 02/05/2018 showed H&H of 9.6/30.8.  Patient states that she also underwent capsule endoscopy which did not show any evidence of bleeding.  Etiology of iron deficiency anemia unclear but patient has been requiring IV iron on a consistent basis    Interval history Patient reports some fatigue but denies other complaints   Review of Systems  Constitutional:  Positive for malaise/fatigue. Negative for chills, fever and weight loss.  HENT:  Negative for congestion, ear discharge and nosebleeds.   Eyes:  Negative for blurred vision.  Respiratory:  Negative for cough, hemoptysis, sputum production, shortness of breath and wheezing.   Cardiovascular:  Negative for chest pain, palpitations, orthopnea and claudication.  Gastrointestinal:  Negative for  abdominal pain, blood in stool, constipation, diarrhea, heartburn, melena, nausea and vomiting.  Genitourinary:  Negative for dysuria, flank pain, frequency, hematuria and urgency.  Musculoskeletal:  Negative for back pain, joint pain and myalgias.  Skin:  Negative for rash.  Neurological:  Negative for dizziness, tingling, focal weakness, seizures, weakness and headaches.   Endo/Heme/Allergies:  Does not bruise/bleed easily.  Psychiatric/Behavioral:  Negative for depression and suicidal ideas. The patient does not have insomnia.    Allergies  Allergen Reactions   Iodinated Contrast Media Other (See Comments)    Becomes 'unresponsive' to ORAL and IV DYE BETADINE ON THE SKIN IS OKAY unresponsive Patient could hear things but not responsive Becomes 'unresponsive' to ORAL and IV DYE BETADINE ON THE SKIN IS OKAY  Patient could hear things but not responsive Becomes 'unresponsive' to ORAL and IV DYE BETADINE ON THE SKIN IS OKAY Becomes 'unresponsive' to ORAL and IV DYE BETADINE ON THE SKIN IS OKAY unresponsive Patient could hear things but not responsiveBecomes 'unresponsive' to ORAL and IV DYE BETADINE ON THE SKIN IS OKAY   Iodine     Other reaction(s): Other (See Comments) Becomes 'unresponsive' to ORAL and IV DYE BETADINE ON THE SKIN IS OKAY unresponsive Patient could hear things but not responsive Becomes 'unresponsive' to ORAL and IV DYE BETADINE ON THE SKIN IS OKAY  Patient could hear things but not responsive Becomes 'unresponsive' to ORAL and IV DYE BETADINE ON THE SKIN IS OKAY Becomes 'unresponsive' to ORAL and IV DYE BETADINE ON THE SKIN IS OKAY unresponsive Patient could hear things but not responsiveBecomes 'unresponsive' to ORAL and IV DYE BETADINE ON THE SKIN IS OKAY   Diclofenac Hives    HORRIBLE RASH with both PATCH OR CREAM   Ibuprofen Itching   Zyrtec [Cetirizine] Other (See Comments)    HEADACHE    Cephalexin Rash   Maxalt [Rizatriptan Benzoate] Other (See Comments)    'Heart Races'   Orphenadrine Citrate Other (See Comments)    Patient unsure of this allergy.   Zithromax [Azithromycin] Other (See Comments)    Severe Abdominal Cramps   Zomig [Zolmitriptan] Other (See Comments)    'Heart Races'    Past Medical History:  Diagnosis Date   Anemia    iron deficiency and b12 deficiency   Anxiety    Arthritis     Asthma    Diabetes mellitus without complication ([place])    Fasciitis    left foot   GERD (gastroesophageal reflux disease)    History of kidney stones    Hypercholesterolemia    Hypertension    Migraines    MIGRAINES HAVE IMPROVED SINCE RECEIVING IRON    Past Surgical History:  Procedure Laterality Date   CHOLECYSTECTOMY  2004   COLONOSCOPY WITH ESOPHAGOGASTRODUODENOSCOPY (EGD)  02/2018   ESOPHAGOGASTRODUODENOSCOPY N/A 10/16/2020   Procedure: ESOPHAGOGASTRODUODENOSCOPY (EGD);  Surgeon: Lesly Rubenstein, MD;  Location: [facility] ENDOSCOPY;  Service: Endoscopy;  Laterality: N/A;   ESOPHAGOGASTRODUODENOSCOPY (EGD) WITH PROPOFOL N/A 06/06/2016   Procedure: ESOPHAGOGASTRODUODENOSCOPY (EGD) WITH PROPOFOL;  Surgeon: Manya Silvas, MD;  Location: [facility] ENDOSCOPY;  Service: Endoscopy;  Laterality: N/A;   EXTRACORPOREAL SHOCK WAVE LITHOTRIPSY  2010   JOINT REPLACEMENT  2013   LT TKR   SAVORY DILATION N/A 06/06/2016   Procedure: SAVORY DILATION;  Surgeon: Manya Silvas, MD;  Location: [facility] ENDOSCOPY;  Service: Endoscopy;  Laterality: N/A;   SAVORY DILATION  02/2018   SHOULDER ARTHROSCOPY WITH OPEN ROTATOR CUFF REPAIR Right 05/24/2018   Procedure: right shoulder  arthroscopy, extensive arthroscopic debridement, decompression, open rotator cuff repair, biceps tenodesis;  Surgeon: Corky Mull, MD;  Location: [facility] ORS;  Service: Orthopedics;  Laterality: Right;    Social History   Socioeconomic History   Marital status: Married    Spouse name: Not on file   Number of children: Not on file   Years of education: Not on file   Highest education level: Not on file  Occupational History   Not on file  Tobacco Use   Smoking status: Never   Smokeless tobacco: Never  Vaping Use   Vaping Use: Never used  Substance and Sexual Activity   Alcohol use: No   Drug use: No   Sexual activity: Not Currently  Other Topics Concern   Not on file  Social History Narrative   Not on file   Social  Determinants of Health   Financial Resource Strain: Not on file  Food Insecurity: Not on file  Transportation Needs: Not on file  Physical Activity: Not on file  Stress: Not on file  Social Connections: Not on file  Intimate Partner Violence: Not on file    Family History  Problem Relation Age of Onset   COPD Mother    Heart disease Mother    Asthma Mother    Schizophrenia Mother    Hemophilia Father    Prostate cancer Father    Heart disease Father    Epilepsy Sister    Stroke Sister    Alcohol abuse Sister    Hypertension Sister    Epilepsy Brother    Lupus Daughter    Gallbladder disease Daughter    Breast cancer Neg Hx      Current Outpatient Medications:    albuterol (PROVENTIL HFA;VENTOLIN HFA) 108 (90 Base) MCG/ACT inhaler, Inhale 2 puffs into the lungs every 6 (six) hours as needed for wheezing or shortness of breath., Disp: , Rfl:    aspirin EC 81 MG tablet, Take 81 mg by mouth daily., Disp: , Rfl:    atenolol (TENORMIN) 50 MG tablet, Take 50 mg by mouth daily., Disp: , Rfl:    cetirizine (ZYRTEC) 10 MG tablet, Take 10 mg by mouth as needed for allergies. (Patient not taking: No sig reported), Disp: , Rfl:    Cholecalciferol 2000 units CAPS, Take 2,000 Units by mouth daily., Disp: , Rfl:    cyclobenzaprine (FLEXERIL) 5 MG tablet, Take 5 mg by mouth 3 (three) times daily as needed for muscle spasms., Disp: , Rfl:    gabapentin (NEURONTIN) 800 MG tablet, Take by mouth., Disp: , Rfl:    hydrochlorothiazide (HYDRODIURIL) 12.5 MG tablet, Take 12.5 mg by mouth as needed (fluid). (Patient not taking: No sig reported), Disp: , Rfl:    Melatonin 10 MG TABS, Take 1 tablet by mouth at bedtime as needed., Disp: , Rfl:    Melatonin 3 MG TBDP, Take by mouth. (Patient not taking: Reported on 09/22/2021), Disp: , Rfl:    mirtazapine (REMERON) 15 MG tablet, Take 1 tablet by mouth at bedtime., Disp: , Rfl:    Multiple Vitamin (MULTI-VITAMIN) tablet, Take 1 tablet by mouth daily.,  Disp: , Rfl:    naratriptan (AMERGE) 2.5 MG tablet, Take 2.5 mg by mouth as needed for migraine. Take one (1) tablet at onset of headache; if returns or does not resolve, may repeat after 4 hours; do not exceed five (5) mg in 24 hours., Disp: , Rfl:    omeprazole (PRILOSEC) 40 MG capsule, Take 40 mg by mouth  2 (two) times daily., Disp: , Rfl:    OZEMPIC, 0.25 OR 0.5 MG/DOSE, 2 MG/3ML SOPN, Inject into the skin., Disp: , Rfl:    pioglitazone-metformin (ACTOPLUS MET) 15-850 MG tablet, Take 1 tablet by mouth 2 (two) times daily with a meal., Disp: , Rfl:    PREVIDENT 5000 BOOSTER PLUS 1.1 % PSTE, Place onto teeth., Disp: , Rfl:    promethazine (PHENERGAN) 25 MG tablet, Take by mouth., Disp: , Rfl:    simvastatin (ZOCOR) 10 MG tablet, Take 1 tablet by mouth at bedtime., Disp: , Rfl:    SUMAtriptan (IMITREX) 100 MG tablet, Take by mouth. (Patient not taking: Reported on 09/22/2021), Disp: , Rfl:    vitamin B-12 (CYANOCOBALAMIN) 1000 MCG tablet, Take 1,000 mcg by mouth daily., Disp: , Rfl:   No results found.  No images are attached to the encounter.      Latest Ref Rng & Units 08/21/2019    8:10 PM  CMP  Glucose 70 - 99 mg/dL 122    BUN 6 - 20 mg/dL 14    Creatinine 0.44 - 1.00 mg/dL 0.75    Sodium 135 - 145 mmol/L 139    Potassium 3.5 - 5.1 mmol/L 3.6    Chloride 98 - 111 mmol/L 101    CO2 22 - 32 mmol/L 29    Calcium 8.9 - 10.3 mg/dL 10.4    Total Protein 6.5 - 8.1 g/dL 7.4    Total Bilirubin 0.3 - 1.2 mg/dL 0.8    Alkaline Phos 38 - 126 U/L 77    AST 15 - 41 U/L 16    ALT 0 - 44 U/L 18        Latest Ref Rng & Units 03/17/2022   10:39 AM  CBC  WBC 4.0 - 10.5 K/uL 6.6    Hemoglobin 12.0 - 15.0 g/dL 13.5    Hematocrit 36.0 - 46.0 % 42.7    Platelets 150 - 400 K/uL 319       Observation/objective:appears in no acute distress over video vsiit today. Breathing is non labored  Assessment and plan:Patient is a 61 yr old female and this is a a routine f/u visit for iron deficiency  anemia  Hb is normal at 13.5. ferritin is normal at 205. She does not require IV iron at this time  Follow-up instructions:repeat cbc ferritin and iron studies in 2, 4 and 6 months. See dr Janese Banks in 6 months  I discussed the assessment and treatment plan with the patient. The patient was provided an opportunity to ask questions and all were answered. The patient agreed with the plan and demonstrated an understanding of the instructions.   The patient was advised to call back or seek an in-person evaluation if the symptoms worsen or if the condition fails to improve as anticipated.  Visit Diagnosis: 1. Iron deficiency anemia, unspecified iron deficiency anemia type     Dr. Randa Evens, MD, MPH [facility] Tel- [phone] 03/24/2022 12:43 PM

## 2022-04-27 ENCOUNTER — Encounter: Payer: Self-pay | Admitting: Oncology

## 2022-05-23 ENCOUNTER — Other Ambulatory Visit: Payer: Self-pay

## 2022-05-23 ENCOUNTER — Other Ambulatory Visit: Payer: Self-pay | Admitting: *Deleted

## 2022-05-23 ENCOUNTER — Inpatient Hospital Stay: Payer: BC Managed Care – PPO | Attending: Oncology

## 2022-05-23 DIAGNOSIS — D509 Iron deficiency anemia, unspecified: Secondary | ICD-10-CM

## 2022-05-23 LAB — CBC WITH DIFFERENTIAL/PLATELET
Abs Immature Granulocytes: 0.05 10*3/uL (ref 0.00–0.07)
Basophils Absolute: 0 10*3/uL (ref 0.0–0.1)
Basophils Relative: 1 %
Eosinophils Absolute: 0.3 10*3/uL (ref 0.0–0.5)
Eosinophils Relative: 3 %
HCT: 46.1 % — ABNORMAL HIGH (ref 36.0–46.0)
Hemoglobin: 14.7 g/dL (ref 12.0–15.0)
Immature Granulocytes: 1 %
Lymphocytes Relative: 17 %
Lymphs Abs: 1.4 10*3/uL (ref 0.7–4.0)
MCH: 27.6 pg (ref 26.0–34.0)
MCHC: 31.9 g/dL (ref 30.0–36.0)
MCV: 86.7 fL (ref 80.0–100.0)
Monocytes Absolute: 0.7 10*3/uL (ref 0.1–1.0)
Monocytes Relative: 9 %
Neutro Abs: 5.8 10*3/uL (ref 1.7–7.7)
Neutrophils Relative %: 69 %
Platelets: 301 10*3/uL (ref 150–400)
RBC: 5.32 MIL/uL — ABNORMAL HIGH (ref 3.87–5.11)
RDW: 15.1 % (ref 11.5–15.5)
WBC: 8.3 10*3/uL (ref 4.0–10.5)
nRBC: 0 % (ref 0.0–0.2)

## 2022-05-23 LAB — IRON AND TIBC
Iron: 50 ug/dL (ref 28–170)
Saturation Ratios: 16 % (ref 10.4–31.8)
TIBC: 315 ug/dL (ref 250–450)
UIBC: 265 ug/dL

## 2022-05-23 LAB — FERRITIN: Ferritin: 114 ng/mL (ref 11–307)

## 2022-07-24 ENCOUNTER — Other Ambulatory Visit: Payer: Self-pay | Admitting: *Deleted

## 2022-07-24 DIAGNOSIS — D509 Iron deficiency anemia, unspecified: Secondary | ICD-10-CM

## 2022-07-25 ENCOUNTER — Inpatient Hospital Stay: Payer: BC Managed Care – PPO | Attending: Oncology

## 2022-07-25 DIAGNOSIS — D509 Iron deficiency anemia, unspecified: Secondary | ICD-10-CM | POA: Insufficient documentation

## 2022-07-25 LAB — IRON AND TIBC
Iron: 69 ug/dL (ref 28–170)
Saturation Ratios: 22 % (ref 10.4–31.8)
TIBC: 308 ug/dL (ref 250–450)
UIBC: 239 ug/dL

## 2022-07-25 LAB — CBC
HCT: 47.5 % — ABNORMAL HIGH (ref 36.0–46.0)
Hemoglobin: 15.5 g/dL — ABNORMAL HIGH (ref 12.0–15.0)
MCH: 28.5 pg (ref 26.0–34.0)
MCHC: 32.6 g/dL (ref 30.0–36.0)
MCV: 87.3 fL (ref 80.0–100.0)
Platelets: 342 10*3/uL (ref 150–400)
RBC: 5.44 MIL/uL — ABNORMAL HIGH (ref 3.87–5.11)
RDW: 14.6 % (ref 11.5–15.5)
WBC: 9.9 10*3/uL (ref 4.0–10.5)
nRBC: 0 % (ref 0.0–0.2)

## 2022-07-25 LAB — FERRITIN: Ferritin: 163 ng/mL (ref 11–307)

## 2022-09-21 ENCOUNTER — Other Ambulatory Visit: Payer: Self-pay

## 2022-09-21 DIAGNOSIS — E538 Deficiency of other specified B group vitamins: Secondary | ICD-10-CM

## 2022-09-21 DIAGNOSIS — D509 Iron deficiency anemia, unspecified: Secondary | ICD-10-CM

## 2022-09-23 ENCOUNTER — Inpatient Hospital Stay (HOSPITAL_BASED_OUTPATIENT_CLINIC_OR_DEPARTMENT_OTHER): Payer: BC Managed Care – PPO | Admitting: Oncology

## 2022-09-23 ENCOUNTER — Inpatient Hospital Stay: Payer: BC Managed Care – PPO | Attending: Oncology

## 2022-09-23 ENCOUNTER — Encounter: Payer: Self-pay | Admitting: Oncology

## 2022-09-23 VITALS — BP 115/85 | HR 85 | Temp 96.9°F | Resp 18 | Wt 208.0 lb

## 2022-09-23 DIAGNOSIS — E119 Type 2 diabetes mellitus without complications: Secondary | ICD-10-CM | POA: Insufficient documentation

## 2022-09-23 DIAGNOSIS — Z87442 Personal history of urinary calculi: Secondary | ICD-10-CM | POA: Diagnosis not present

## 2022-09-23 DIAGNOSIS — Z888 Allergy status to other drugs, medicaments and biological substances status: Secondary | ICD-10-CM | POA: Diagnosis not present

## 2022-09-23 DIAGNOSIS — Z886 Allergy status to analgesic agent status: Secondary | ICD-10-CM | POA: Diagnosis not present

## 2022-09-23 DIAGNOSIS — E785 Hyperlipidemia, unspecified: Secondary | ICD-10-CM | POA: Diagnosis not present

## 2022-09-23 DIAGNOSIS — Z832 Family history of diseases of the blood and blood-forming organs and certain disorders involving the immune mechanism: Secondary | ICD-10-CM | POA: Diagnosis not present

## 2022-09-23 DIAGNOSIS — Z818 Family history of other mental and behavioral disorders: Secondary | ICD-10-CM | POA: Insufficient documentation

## 2022-09-23 DIAGNOSIS — D509 Iron deficiency anemia, unspecified: Secondary | ICD-10-CM | POA: Insufficient documentation

## 2022-09-23 DIAGNOSIS — Z825 Family history of asthma and other chronic lower respiratory diseases: Secondary | ICD-10-CM | POA: Diagnosis not present

## 2022-09-23 DIAGNOSIS — Z91041 Radiographic dye allergy status: Secondary | ICD-10-CM | POA: Diagnosis not present

## 2022-09-23 DIAGNOSIS — Z881 Allergy status to other antibiotic agents status: Secondary | ICD-10-CM | POA: Insufficient documentation

## 2022-09-23 DIAGNOSIS — Z820 Family history of epilepsy and other diseases of the nervous system: Secondary | ICD-10-CM | POA: Diagnosis not present

## 2022-09-23 DIAGNOSIS — J45909 Unspecified asthma, uncomplicated: Secondary | ICD-10-CM | POA: Diagnosis not present

## 2022-09-23 DIAGNOSIS — Z8379 Family history of other diseases of the digestive system: Secondary | ICD-10-CM | POA: Diagnosis not present

## 2022-09-23 DIAGNOSIS — M1990 Unspecified osteoarthritis, unspecified site: Secondary | ICD-10-CM | POA: Diagnosis not present

## 2022-09-23 DIAGNOSIS — I10 Essential (primary) hypertension: Secondary | ICD-10-CM | POA: Insufficient documentation

## 2022-09-23 DIAGNOSIS — Z823 Family history of stroke: Secondary | ICD-10-CM | POA: Insufficient documentation

## 2022-09-23 DIAGNOSIS — Z9049 Acquired absence of other specified parts of digestive tract: Secondary | ICD-10-CM | POA: Insufficient documentation

## 2022-09-23 DIAGNOSIS — Z8249 Family history of ischemic heart disease and other diseases of the circulatory system: Secondary | ICD-10-CM | POA: Insufficient documentation

## 2022-09-23 DIAGNOSIS — Z8042 Family history of malignant neoplasm of prostate: Secondary | ICD-10-CM | POA: Diagnosis not present

## 2022-09-23 DIAGNOSIS — Z79899 Other long term (current) drug therapy: Secondary | ICD-10-CM | POA: Insufficient documentation

## 2022-09-23 LAB — CBC WITH DIFFERENTIAL/PLATELET
Abs Immature Granulocytes: 0.05 10*3/uL (ref 0.00–0.07)
Basophils Absolute: 0.1 10*3/uL (ref 0.0–0.1)
Basophils Relative: 1 %
Eosinophils Absolute: 0.2 10*3/uL (ref 0.0–0.5)
Eosinophils Relative: 2 %
HCT: 48.1 % — ABNORMAL HIGH (ref 36.0–46.0)
Hemoglobin: 15.8 g/dL — ABNORMAL HIGH (ref 12.0–15.0)
Immature Granulocytes: 1 %
Lymphocytes Relative: 15 %
Lymphs Abs: 1.5 10*3/uL (ref 0.7–4.0)
MCH: 28.8 pg (ref 26.0–34.0)
MCHC: 32.8 g/dL (ref 30.0–36.0)
MCV: 87.6 fL (ref 80.0–100.0)
Monocytes Absolute: 0.8 10*3/uL (ref 0.1–1.0)
Monocytes Relative: 8 %
Neutro Abs: 7.2 10*3/uL (ref 1.7–7.7)
Neutrophils Relative %: 73 %
Platelets: 353 10*3/uL (ref 150–400)
RBC: 5.49 MIL/uL — ABNORMAL HIGH (ref 3.87–5.11)
RDW: 14 % (ref 11.5–15.5)
WBC: 9.7 10*3/uL (ref 4.0–10.5)
nRBC: 0 % (ref 0.0–0.2)

## 2022-09-23 LAB — FERRITIN: Ferritin: 140 ng/mL (ref 11–307)

## 2022-09-23 LAB — IRON AND TIBC
Iron: 77 ug/dL (ref 28–170)
Saturation Ratios: 22 % (ref 10.4–31.8)
TIBC: 351 ug/dL (ref 250–450)
UIBC: 274 ug/dL

## 2022-09-23 NOTE — Progress Notes (Signed)
Hematology/Oncology Consult note [facility]  [phone] [phone]  Patient Care Team: Dalia Heading, MD as PCP - General (Cardiology) Stanton Kidney, MD as Consulting Physician (Gastroenterology) Creig Hines, MD as Consulting Physician (Hematology and Oncology) Dalia Heading, MD as Consulting Physician (Cardiology) Creig Hines, MD as Consulting Physician (Hematology and Oncology)   Name of the patient: Andrea Zhang  [id number]  03-12-1961   Date of visit: 09/23/22  Diagnosis-iron deficiency anemia  Chief complaint/ Reason for visit-routine follow-up of iron deficiency anemia  Heme/Onc history: patient is a 61 year old female with a past medical history significant for hypertension hyperlipidemia, asthma diabetes osteoarthritis among other medical problems.  She also has chronic arthritis for which she sees Dr. Gavin Potters and has been getting joint injections.  She was seen by her PCP on 01/12/2018 with symptoms of lightheadedness and worsening shortness of breath.  She has been earlier seen by Dr. Lady Gary from cardiology on 12/19/2017 and underwent stress test and echocardiogram that was normal.  Blood work done on 01/12/2018 was as follows: CBC showed white count of 8.4, H&H of 6.7/22.6 with an MCV of 81 and a platelet count of 324.  Differential on the CBC was normal.  Iron study showed TIBC that was elevated at 546.  Percentage iron saturation was 36.  Ferritin levels were not checked.  CMP and TSH were within normal.  B12 levels in November 2018 were low at 241.  Upper endoscopy from July 27 showed a Schatzki's ring which was dilated and evidence of gastritis.  Duodenum was normal.  She has had 2-3 EGDs in the past for strictures requiring dilatation.  She has had colonoscopy back in 2011 which was apparently normal.   Results of blood work from 01/15/2018 were as follows: CBC showed white count of 7.4, H&H of 7.5/24 with an MCV of  78.2 and a platelet count of 299.  Ferritin levels were low at 7.  Iron studies showed a low iron saturation and elevated TIBC of 560 haptoglobin was normal, celiac disease panel was negative, myeloma panel revealed no monoclonal protein.  B12 level was low low at 210.  Reticulocyte count was elevated at 7.5 indicating response to anemia.  Folate level was normal at 7.6.  Urinalysis did not reveal any hematuria.  Stool H. pylori antigen was negative   Patient was seen by [facility] and underwent EGD and colonoscopy in May 2019 which was apparently unremarkable.  Patient has not had a capsule endoscopy done yet.  She received 2 doses of Feraheme in March 2019 as well as 3 doses of B12.  Repeat CBC from 02/05/2018 showed H&H of 9.6/30.8.  Patient states that she also underwent capsule endoscopy which did not show any evidence of bleeding.  Etiology of iron deficiency anemia unclear but patient has been requiring IV iron on a consistent basis.  Last received IV iron in April 2023  Interval history-patient presently reports feeling well.  Energy levels are better.  She has some baseline ringing sensation in her years  ECOG PS- 1 Pain scale- 0   Review of systems- Review of Systems  Constitutional:  Negative for chills, fever, malaise/fatigue and weight loss.  HENT:  Negative for congestion, ear discharge and nosebleeds.   Eyes:  Negative for blurred vision.  Respiratory:  Negative for cough, hemoptysis, sputum production, shortness of breath and wheezing.   Cardiovascular:  Negative for chest pain, palpitations, orthopnea and claudication.  Gastrointestinal:  Negative for abdominal pain, blood in stool, constipation, diarrhea, heartburn, melena, nausea and vomiting.  Genitourinary:  Negative for dysuria, flank pain, frequency, hematuria and urgency.  Musculoskeletal:  Negative for back pain, joint pain and myalgias.  Skin:  Negative for rash.  Neurological:  Negative for dizziness, tingling,  focal weakness, seizures, weakness and headaches.  Endo/Heme/Allergies:  Does not bruise/bleed easily.  Psychiatric/Behavioral:  Negative for depression and suicidal ideas. The patient does not have insomnia.       Allergies  Allergen Reactions   Iodinated Contrast Media Other (See Comments)    Becomes 'unresponsive' to ORAL and IV DYE BETADINE ON THE SKIN IS OKAY unresponsive Patient could hear things but not responsive Becomes 'unresponsive' to ORAL and IV DYE BETADINE ON THE SKIN IS OKAY  Patient could hear things but not responsive Becomes 'unresponsive' to ORAL and IV DYE BETADINE ON THE SKIN IS OKAY Becomes 'unresponsive' to ORAL and IV DYE BETADINE ON THE SKIN IS OKAY unresponsive Patient could hear things but not responsiveBecomes 'unresponsive' to ORAL and IV DYE BETADINE ON THE SKIN IS OKAY   Iodine     Other reaction(s): Other (See Comments) Becomes 'unresponsive' to ORAL and IV DYE BETADINE ON THE SKIN IS OKAY unresponsive Patient could hear things but not responsive Becomes 'unresponsive' to ORAL and IV DYE BETADINE ON THE SKIN IS OKAY  Patient could hear things but not responsive Becomes 'unresponsive' to ORAL and IV DYE BETADINE ON THE SKIN IS OKAY Becomes 'unresponsive' to ORAL and IV DYE BETADINE ON THE SKIN IS OKAY unresponsive Patient could hear things but not responsiveBecomes 'unresponsive' to ORAL and IV DYE BETADINE ON THE SKIN IS OKAY   Diclofenac Hives    HORRIBLE RASH with both PATCH OR CREAM   Ibuprofen Itching   Zyrtec [Cetirizine] Other (See Comments)    HEADACHE    Cephalexin Rash   Maxalt [Rizatriptan Benzoate] Other (See Comments)    'Heart Races'   Orphenadrine Citrate Other (See Comments)    Patient unsure of this allergy.   Zithromax [Azithromycin] Other (See Comments)    Severe Abdominal Cramps   Zomig [Zolmitriptan] Other (See Comments)    'Heart Races'     Past Medical History:  Diagnosis Date   Anemia    iron  deficiency and b12 deficiency   Anxiety    Arthritis    Asthma    Diabetes mellitus without complication (HCC)    Fasciitis    left foot   GERD (gastroesophageal reflux disease)    History of kidney stones    Hypercholesterolemia    Hypertension    Migraines    MIGRAINES HAVE IMPROVED SINCE RECEIVING IRON     Past Surgical History:  Procedure Laterality Date   CHOLECYSTECTOMY  2004   COLONOSCOPY WITH ESOPHAGOGASTRODUODENOSCOPY (EGD)  02/2018   ESOPHAGOGASTRODUODENOSCOPY N/A 10/16/2020   Procedure: ESOPHAGOGASTRODUODENOSCOPY (EGD);  Surgeon: Regis Bill, MD;  Location[facility] ENDOSCOPY;  Service: Endoscopy;  Laterality: N/A;   ESOPHAGOGASTRODUODENOSCOPY (EGD) WITH PROPOFOL N/A 06/06/2016   Procedure: ESOPHAGOGASTRODUODENOSCOPY (EGD) WITH PROPOFOL;  Surgeon: Scot Jun, MD;  Location: [facility] ENDOSCOPY;  Service: Endoscopy;  Laterality: N/A;   EXTRACORPOREAL SHOCK WAVE LITHOTRIPSY  2010   JOINT REPLACEMENT  2013   LT TKR   SAVORY DILATION N/A 06/06/2016   Procedure: SAVORY DILATION;  Surgeon: Scot Jun, MD;  Location: [facility] ENDOSCOPY;  Service: Endoscopy;  Laterality: N/A;   SAVORY DILATION  02/2018   SHOULDER ARTHROSCOPY WITH OPEN ROTATOR CUFF REPAIR  Right 05/24/2018   Procedure: right shoulder arthroscopy, extensive arthroscopic debridement, decompression, open rotator cuff repair, biceps tenodesis;  Surgeon: Christena Flake, MD;  Location: [facility] ORS;  Service: Orthopedics;  Laterality: Right;    Social History   Socioeconomic History   Marital status: Married    Spouse name: Not on file   Number of children: Not on file   Years of education: Not on file   Highest education level: Not on file  Occupational History   Not on file  Tobacco Use   Smoking status: Never   Smokeless tobacco: Never  Vaping Use   Vaping Use: Never used  Substance and Sexual Activity   Alcohol use: No   Drug use: No   Sexual activity: Not Currently  Other Topics Concern   Not on  file  Social History Narrative   Not on file   Social Determinants of Health   Financial Resource Strain: Not on file  Food Insecurity: Not on file  Transportation Needs: Not on file  Physical Activity: Not on file  Stress: Not on file  Social Connections: Not on file  Intimate Partner Violence: Not on file    Family History  Problem Relation Age of Onset   COPD Mother    Heart disease Mother    Asthma Mother    Schizophrenia Mother    Hemophilia Father    Prostate cancer Father    Heart disease Father    Epilepsy Sister    Stroke Sister    Alcohol abuse Sister    Hypertension Sister    Epilepsy Brother    Lupus Daughter    Gallbladder disease Daughter    Breast cancer Neg Hx      Current Outpatient Medications:    albuterol (PROVENTIL HFA;VENTOLIN HFA) 108 (90 Base) MCG/ACT inhaler, Inhale 2 puffs into the lungs every 6 (six) hours as needed for wheezing or shortness of breath., Disp: , Rfl:    aspirin EC 81 MG tablet, Take 81 mg by mouth daily., Disp: , Rfl:    atenolol (TENORMIN) 50 MG tablet, Take 50 mg by mouth daily., Disp: , Rfl:    Cholecalciferol 2000 units CAPS, Take 2,000 Units by mouth daily., Disp: , Rfl:    cyclobenzaprine (FLEXERIL) 5 MG tablet, Take 5 mg by mouth 3 (three) times daily as needed for muscle spasms., Disp: , Rfl:    gabapentin (NEURONTIN) 800 MG tablet, Take by mouth., Disp: , Rfl:    meloxicam (MOBIC) 15 MG tablet, Take 15 mg by mouth daily., Disp: , Rfl:    mirtazapine (REMERON) 15 MG tablet, Take 1 tablet by mouth at bedtime., Disp: , Rfl:    Multiple Vitamin (MULTI-VITAMIN) tablet, Take 1 tablet by mouth daily., Disp: , Rfl:    naratriptan (AMERGE) 2.5 MG tablet, Take 2.5 mg by mouth as needed for migraine. Take one (1) tablet at onset of headache; if returns or does not resolve, may repeat after 4 hours; do not exceed five (5) mg in 24 hours., Disp: , Rfl:    omeprazole (PRILOSEC) 40 MG capsule, Take 40 mg by mouth 2 (two) times daily.,  Disp: , Rfl:    OZEMPIC, 0.25 OR 0.5 MG/DOSE, 2 MG/3ML SOPN, Inject into the skin., Disp: , Rfl:    PREVIDENT 5000 BOOSTER PLUS 1.1 % PSTE, Place onto teeth., Disp: , Rfl:    promethazine (PHENERGAN) 25 MG tablet, Take by mouth., Disp: , Rfl:    simvastatin (ZOCOR) 10 MG tablet, Take 1  tablet by mouth at bedtime., Disp: , Rfl:    vitamin B-12 (CYANOCOBALAMIN) 1000 MCG tablet, Take 1,000 mcg by mouth daily., Disp: , Rfl:    cetirizine (ZYRTEC) 10 MG tablet, Take 10 mg by mouth as needed for allergies. (Patient not taking: Reported on 05/19/2021), Disp: , Rfl:    hydrochlorothiazide (HYDRODIURIL) 12.5 MG tablet, Take 12.5 mg by mouth as needed (fluid). (Patient not taking: Reported on 10/16/2020), Disp: , Rfl:    pioglitazone-metformin (ACTOPLUS MET) 15-850 MG tablet, Take 1 tablet by mouth 2 (two) times daily with a meal. (Patient not taking: Reported on 09/23/2022), Disp: , Rfl:    SUMAtriptan (IMITREX) 100 MG tablet, Take by mouth. (Patient not taking: Reported on 09/22/2021), Disp: , Rfl:   Physical exam:  Vitals:   09/23/22 1406  BP: 115/85  Pulse: 85  Resp: 18  Temp: (!) 96.9 F (36.1 C)  SpO2: 95%  Weight: 208 lb (94.3 kg)   Physical Exam Cardiovascular:     Rate and Rhythm: Normal rate and regular rhythm.     Heart sounds: Normal heart sounds.  Pulmonary:     Effort: Pulmonary effort is normal.     Breath sounds: Normal breath sounds.  Abdominal:     General: Bowel sounds are normal.     Palpations: Abdomen is soft.  Skin:    General: Skin is warm and dry.  Neurological:     Mental Status: She is alert and oriented to person, place, and time.         Latest Ref Rng & Units 08/21/2019    8:10 PM  CMP  Glucose 70 - 99 mg/dL 782   BUN 6 - 20 mg/dL 14   Creatinine 9.56 - 1.00 mg/dL 2.13   Sodium 086 - 578 mmol/L 139   Potassium 3.5 - 5.1 mmol/L 3.6   Chloride 98 - 111 mmol/L 101   CO2 22 - 32 mmol/L 29   Calcium 8.9 - 10.3 mg/dL 46.9   Total Protein 6.5 - 8.1 g/dL  7.4   Total Bilirubin 0.3 - 1.2 mg/dL 0.8   Alkaline Phos 38 - 126 U/L 77   AST 15 - 41 U/L 16   ALT 0 - 44 U/L 18       Latest Ref Rng & Units 09/23/2022    1:50 PM  CBC  WBC 4.0 - 10.5 K/uL 9.7   Hemoglobin 12.0 - 15.0 g/dL 62.9   Hematocrit 52.8 - 46.0 % 48.1   Platelets 150 - 400 K/uL 353      Assessment and plan- Patient is a 61 y.o. female with history of iron deficiency anemia here for routine follow-up  Patient last received IV iron in April 2023.  Presently her hemoglobin is high normal.  Iron studies are currently pending and I do not think that she will require any IV iron at this time.  I will repeat CBC ferritin and iron studies in 3 in 6 months and I will see her back in 6 months   Visit Diagnosis 1. Iron deficiency anemia, unspecified iron deficiency anemia type      Dr. Owens Shark, MD, MPH [facility] [place] [phone] 09/23/2022 2:27 PM

## 2022-10-25 ENCOUNTER — Other Ambulatory Visit: Payer: Self-pay | Admitting: Physician Assistant

## 2022-10-25 DIAGNOSIS — G43009 Migraine without aura, not intractable, without status migrainosus: Secondary | ICD-10-CM

## 2022-11-14 ENCOUNTER — Ambulatory Visit: Payer: BC Managed Care – PPO | Attending: Orthopedic Surgery | Admitting: Physical Therapy

## 2022-11-14 ENCOUNTER — Encounter: Payer: Self-pay | Admitting: Physical Therapy

## 2022-11-14 DIAGNOSIS — M25561 Pain in right knee: Secondary | ICD-10-CM | POA: Diagnosis present

## 2022-11-14 DIAGNOSIS — M6281 Muscle weakness (generalized): Secondary | ICD-10-CM | POA: Insufficient documentation

## 2022-11-14 DIAGNOSIS — M25612 Stiffness of left shoulder, not elsewhere classified: Secondary | ICD-10-CM | POA: Insufficient documentation

## 2022-11-14 DIAGNOSIS — M25512 Pain in left shoulder: Secondary | ICD-10-CM | POA: Insufficient documentation

## 2022-11-14 DIAGNOSIS — G8929 Other chronic pain: Secondary | ICD-10-CM | POA: Diagnosis present

## 2022-11-14 NOTE — Patient Instructions (Signed)
Access Code: [email or web address] URL: [email or web address] Date: 11/14/2022 Prepared by: Dorcas Carrow  Exercises - Standing Isometric Shoulder Flexion with Doorway - Arm Bent  - 1 x daily - 7 x weekly - 1 sets - 10 reps - 10 seconds hold - Standing Isometric Shoulder External Rotation with Doorway  - 1 x daily - 7 x weekly - 1 sets - 10 reps - 10 seconds hold - Standing Isometric Shoulder Extension with Doorway - Arm Bent  - 1 x daily - 7 x weekly - 1 sets - 10 reps - 10 seconds hold - Seated Scapular Retraction  - 1 x daily - 7 x weekly - 1 sets - 10 reps - 10 seconds hold

## 2022-11-14 NOTE — Therapy (Signed)
OUTPATIENT PHYSICAL THERAPY SHOULDER EVALUATION   Patient Name: Andrea Zhang MRN: [id number] DOB:1961-01-26, 62 y.o., female Today's Date: 11/14/2022  END OF SESSION:  PT End of Session - 11/14/22 1620     Visit Number 1    Number of Visits 13    Date for PT Re-Evaluation 12/26/22    PT Start Time 0943    PT Stop Time 1035    PT Time Calculation (min) 52 min             Past Medical History:  Diagnosis Date   Anemia    iron deficiency and b12 deficiency   Anxiety    Arthritis    Asthma    Diabetes mellitus without complication (HCC)    Fasciitis    left foot   GERD (gastroesophageal reflux disease)    History of kidney stones    Hypercholesterolemia    Hypertension    Migraines    MIGRAINES HAVE IMPROVED SINCE RECEIVING IRON   Past Surgical History:  Procedure Laterality Date   CHOLECYSTECTOMY  2004   COLONOSCOPY WITH ESOPHAGOGASTRODUODENOSCOPY (EGD)  02/2018   ESOPHAGOGASTRODUODENOSCOPY N/A 10/16/2020   Procedure: ESOPHAGOGASTRODUODENOSCOPY (EGD);  Surgeon: Regis Bill, MD;  Location: [facility] ENDOSCOPY;  Service: Endoscopy;  Laterality: N/A;   ESOPHAGOGASTRODUODENOSCOPY (EGD) WITH PROPOFOL N/A 06/06/2016   Procedure: ESOPHAGOGASTRODUODENOSCOPY (EGD) WITH PROPOFOL;  Surgeon: Scot Jun, MD;  Location: [facility] ENDOSCOPY;  Service: Endoscopy;  Laterality: N/A;   EXTRACORPOREAL SHOCK WAVE LITHOTRIPSY  2010   JOINT REPLACEMENT  2013   LT TKR   SAVORY DILATION N/A 06/06/2016   Procedure: SAVORY DILATION;  Surgeon: Scot Jun, MD;  Location: [facility] ENDOSCOPY;  Service: Endoscopy;  Laterality: N/A;   SAVORY DILATION  02/2018   SHOULDER ARTHROSCOPY WITH OPEN ROTATOR CUFF REPAIR Right 05/24/2018   Procedure: right shoulder arthroscopy, extensive arthroscopic debridement, decompression, open rotator cuff repair, biceps tenodesis;  Surgeon: Christena Flake, MD;  Location: [facility] ORS;  Service: Orthopedics;  Laterality: Right;   Patient Active Problem List    Diagnosis Date Noted   Coronary artery calcification 09/09/2019   Aortic atherosclerosis (HCC) 09/09/2019   Allergic rhinitis 08/06/2019   Asthma 08/06/2019   Diabetes mellitus, type 2 (HCC) 08/06/2019   Dysphagia 08/06/2019   GERD (gastroesophageal reflux disease) 08/06/2019   Hypercalcemia 08/06/2019   Hypertension 08/06/2019   Migraines 08/06/2019   Osteoarthritis 08/06/2019   Folate deficiency 03/03/2019   Morbid obesity with BMI of 40.0-44.9, adult (HCC) 06/05/2018   Status post right rotator cuff repair 06/05/2018   Degenerative joint disease of right shoulder 05/24/2018   Degenerative tear of glenoid labrum, right 05/24/2018   Injury of tendon of long head of right biceps 05/24/2018   Nontraumatic complete tear of right rotator cuff 05/14/2018   Rotator cuff tendinitis, right 05/14/2018   Iron deficiency anemia 01/15/2018   B12 deficiency 09/07/2017   Microalbuminuric diabetic nephropathy (HCC) 09/07/2017   Primary osteoarthritis of right knee 02/19/2015   Restless leg syndrome 10/25/2012   Hyperlipidemia with target LDL less than 100 05/20/2012    PCP: Dalia Heading, MD  REFERRING PROVIDER: Dedra Skeens, PA-C  REFERRING DIAG: Primary osteoarthritis of right knee   Chronic bursitis of left shoulder  THERAPY DIAG:  Chronic left shoulder pain  Shoulder joint stiffness, left  Muscle weakness (generalized)  Chronic pain of right knee  Rationale for Evaluation and Treatment: Rehabilitation  ONSET DATE: 09/2022  SUBJECTIVE:  SUBJECTIVE STATEMENT: Pt. Had PT in 2019 s/p R RTC repair.  Pt. Known to PT clinic and currently c/o L shoulder discomfort/ R knee pain.  Pt. Planning to have R TKA in future but not ready at this time.  Pt. Had injection in R knee recently with benefit.  L TKA 10  years ago.  Pt. R hand dominant.  Pt. Sleeps on back and is able to sleep on L shoulder with no issues.  2/10 L posterior shoulder pain at rest and 8/10 stabbing pain at worst in L shoulder.    PERTINENT HISTORY: [name], [place] - 11/09/2022 8:30 AM EST Formatting of this note is different from the original. Images from the original note were not included. Chief Complaint Chief Complaint Patient presents with Left Shoulder - Pain Right Knee - Pain  Reason for Visit The patient is a 62 year old female that presented for reevaluation of right knee. She has documented right knee degenerative joint disease. She had a left total knee replacement done by Dr. Gavin Potters about 10 years ago and has done very well. She has had cortisone injections in June 2023, and did well. The patient is requesting an injection in her right knee today. She is not interested in having knee surgery yet. She has to be careful with walking because of giving out symptoms. She has occasional sharp pain and popping in the right knee. She has swelling in the knee occasionally. The patient is a diabetic.  The patient is also here for reevaluation of her left shoulder. She is difficulty with getting dressed and overhead activities. She has pain on the side of her shoulder that is not going down the arm. She has no neck pain now. She has some biceps pain. She has no injury or trauma. She has continued doing exercises although has some discomfort. She denies any injury to the shoulder. She has no real weakness. She has no pain at nighttime or going behind her back. There is no numbness or tingling down the arm.  Medications Current Outpatient Medications Medication Sig Dispense Refill albuterol 90 mcg/actuation inhaler INHALE 2 INHALATIONS INTO THE LUNGS EVERY 4 HOURS AS NEEDED FOR WHEEZING 3 each 0 aspirin 81 MG EC tablet Take 81 mg by mouth daily. atenoloL (TENORMIN) 50 MG tablet TAKE 1 TABLET(50 MG) BY MOUTH EVERY DAY 90  tablet 1 blood-glucose sensor (FREESTYLE LIBRE 3 SENSOR) Devi Apply to skin every 14 days for glucose monitoring 6 each 1 cholecalciferol (VITAMIN D3) 2,000 unit tablet Take 2,000 Units by mouth once daily. cyclobenzaprine (FLEXERIL) 10 MG tablet Take 1 tablet (10 mg total) by mouth 3 (three) times daily for 180 days 270 tablet 1 eletriptan (RELPAX) 40 MG tablet May take a second dose after 2 hours if needed. Pharmacist: Give maximum amount of medicine allowed/month by patient's insurance. 9 tablet 2 EMGALITY PEN 120 mg/mL PnIj INJECT 120MG  INTO THE SKIN MONTHLY 1 mL 5 gabapentin (NEURONTIN) 800 MG tablet TAKE 1 TABLET(800 MG) BY MOUTH AT BEDTIME 90 tablet 1 hydroCHLOROthiazide (HYDRODIURIL) 12.5 MG tablet Take 1 tablet (12.5 mg total) by mouth once daily 90 tablet 1 meloxicam (MOBIC) 15 MG tablet Take 1 tablet (15 mg total) by mouth once daily for 30 days 30 tablet 3 mirtazapine (REMERON) 15 MG tablet Take 1 tablet (15 mg total) by mouth at bedtime 90 tablet 1 multivitamin tablet Take 1 tablet by mouth once daily naratriptan (AMERGE) 2.5 MG tablet TAKE 1 TABLET BY MOUTH ONCE AS NEEDED FOR MIGRAINE, ADMINISTER  DOSE AT ONSET OF SYMPTOMS, IF NO RELIEF, MAY REPEAT IN 2 HOURS 10 tablet 3 omeprazole (PRILOSEC) 40 MG DR capsule TAKE 1 CAPSULE(40 MG) BY MOUTH TWICE DAILY BEFORE MEALS 180 capsule 1 PREVIDENT 5000 BOOSTER PLUS 1.1 % dental paste Place onto teeth at bedtime promethazine (PHENERGAN) 25 MG tablet TAKE 1 TABLET(25 MG) BY MOUTH EVERY 8 HOURS AS NEEDED FOR NAUSEA 15 tablet 1 semaglutide (OZEMPIC) 1 mg/dose (4 mg/3 mL) pen injector Inject 0.75 mLs (1 mg total) subcutaneously once a week 9 mL 1 simvastatin (ZOCOR) 10 MG tablet Take 1 tablet (10 mg total) by mouth at bedtime 90 tablet 1 SUMAtriptan (IMITREX) 100 MG tablet TAKE 1 TABLET BY MOUTH AS DIRECTED FOR MIGRAINE, MAY TAKE A SECOND DOSE AFTER 2 HOURS, IF NEEDED 10 tablet 3   PAIN:  Are you having pain? Yes: NPRS scale: 2/10 Pain location:  L shoulder Pain description: stabbing/ sharp Aggravating factors: overhead reaching Relieving factors: rest  PRECAUTIONS: None  WEIGHT BEARING RESTRICTIONS: No  FALLS:  Has patient fallen in last 6 months? No  LIVING ENVIRONMENT: Lives with: lives with their spouse Lives in: House/apartment Has following equipment at home: NoneNone  OCCUPATION: Desk job/ computer work.    PLOF: Independent  PATIENT GOALS:  Increase L shoulder pain-free mobility/ prepare for R TKA  NEXT MD VISIT:   OBJECTIVE:   DIAGNOSTIC FINDINGS:  No L shoulder MRI  PATIENT SURVEYS:  FOTO initial 45/ goal 77  COGNITION: Overall cognitive status: Within functional limits for tasks assessed     SENSATION: WFL  POSTURE: Rounded shoulder/ B UT muscle tightness noted.   UPPER EXTREMITY ROM:   Active ROM Right eval Left eval  Shoulder flexion WNL 167 deg.  Shoulder extension WNL   Shoulder abduction WNL 165 deg.  Shoulder adduction WNL   Shoulder internal rotation WNL 85 deg.  Shoulder external rotation WNL 88 deg.  Elbow flexion WNL WNL  Elbow extension WNL WNL  Wrist flexion WNL WNL  Wrist extension    Wrist ulnar deviation    Wrist radial deviation    Wrist pronation    Wrist supination    (Blank rows = not tested)  Cervical: flexion (40 deg.), extension (52 deg.), L lat. Flex. (35 deg.), R lat. Flex. (35 deg.), L rotn. (72 deg.), R rotn. (69 deg.)  UPPER EXTREMITY MMT:  MMT Right eval Left eval  Shoulder flexion 4/5 3/5  Shoulder extension    Shoulder abduction 4/5 3/5  Shoulder adduction 5/5 5/5  Shoulder internal rotation 4/5 4/5  Shoulder external rotation 4/5 3/5  Middle trapezius    Lower trapezius    Elbow flexion 5/5 4+/5  Elbow extension 5/5 5/5  Wrist flexion    Wrist extension    Wrist ulnar deviation    Wrist radial deviation    Wrist pronation    Wrist supination    Grip strength (lbs) 61# 57#  (Blank rows = not tested)  SHOULDER SPECIAL  TESTS: Impingement tests: Neer impingement test: negative and Hawkins/Kennedy impingement test: negative Rotator cuff assessment: Empty can test: positive  and Jobe test (+) for L supraspinatus involvement  JOINT MOBILITY TESTING:  Good B shoulder capsular mobility.    PALPATION:  (+) L posterior deltoid/ supraspinatus tenderness   TODAY'S TREATMENT:  DATE: 11/14/22  Evaluation/ See HEP  PATIENT EDUCATION: Education details: Access Code: [email or web address] Person educated: Patient Education method: Explanation, Demonstration, and Handouts Education comprehension: verbalized understanding and returned demonstration  HOME EXERCISE PROGRAM: Access Code: [email or web address] URL: [email or web address] Date: 11/14/2022 Prepared by: Dorene Grebe   Exercises - Standing Isometric Shoulder Flexion with Doorway - Arm Bent  - 1 x daily - 7 x weekly - 1 sets - 10 reps - 10 seconds hold - Standing Isometric Shoulder External Rotation with Doorway  - 1 x daily - 7 x weekly - 1 sets - 10 reps - 10 seconds hold - Standing Isometric Shoulder Extension with Doorway - Arm Bent  - 1 x daily - 7 x weekly - 1 sets - 10 reps - 10 seconds hold - Seated Scapular Retraction  - 1 x daily - 7 x weekly - 1 sets - 10 reps - 10 seconds hold  ASSESSMENT:  CLINICAL IMPRESSION: Patient is a pleasant 62 y.o. female who was seen today for physical therapy evaluation and treatment for L shoulder pain/ R knee weakness.  Pt. Present with good L shoulder AROM (all planes) with moderate muscle weakness in L as compared to R shoulder/ UE.  Pt. Has (+) L supraspinatus special tests/ involvement and tenderness with palpation.  Pt. Will benefit from skilled PT services to increase L shoulder AROM/ strength to improve pain-free mobility    OBJECTIVE IMPAIRMENTS: decreased endurance, decreased  mobility, decreased ROM, decreased strength, impaired UE functional use, improper body mechanics, postural dysfunction, and pain.   ACTIVITY LIMITATIONS: carrying, lifting, reach over head, and hygiene/grooming  PARTICIPATION LIMITATIONS: meal prep, cleaning, laundry, driving, shopping, and community activity  PERSONAL FACTORS: Past/current experiences are also affecting patient's functional outcome.   REHAB POTENTIAL: Good  CLINICAL DECISION MAKING: Stable/uncomplicated  EVALUATION COMPLEXITY: Low   GOALS: Goals reviewed with patient? Yes  SHORT TERM GOALS: Target date: 12/05/22  Pt. Independent with HEP to increase L shoulder AROM to WNL as compared to R shoulder with no increase c/o pain.   Baseline: see above Goal status: INITIAL   LONG TERM GOALS: Target date: 12/26/22  Pt. Will increase FOTO to 61 to improve pain-free mobility.   Baseline: initial 45 Goal status: INITIAL  2.  Pt. Will report <4/10 L shoulder pain worst with overhead reaching/ lifting tasks.   Baseline: 8/10 L shoulder stabbing pain at worst.   Goal status: INITIAL  3.  Pt. Will increase L shoulder strength 1/2 muscle grade to improve lifting/ carrying tasks.  Baseline: See above Goal status: INITIAL  4.  Pt. Will complete 30 minutes of there.ex./ gym based ex with no increase c/o L shoulder/ R knee pain to prepare for future R TKA.    Baseline: currently not exercising  Goal status: INITIAL   PLAN:  PT FREQUENCY: 2x/week  PT DURATION: 6 weeks  PLANNED INTERVENTIONS: Therapeutic exercises, Therapeutic activity, Neuromuscular re-education, Patient/Family education, Self Care, Joint mobilization, Dry Needling, Electrical stimulation, Cryotherapy, Moist heat, Manual therapy, and Re-evaluation  PLAN FOR NEXT SESSION: Progress HEP/ reassess L shoulder special tests.   Cammie Mcgee, PT, DPT # [phone] 11/14/2022, 4:28 PM

## 2022-11-21 ENCOUNTER — Ambulatory Visit: Payer: BC Managed Care – PPO | Admitting: Physical Therapy

## 2022-11-21 DIAGNOSIS — M25612 Stiffness of left shoulder, not elsewhere classified: Secondary | ICD-10-CM

## 2022-11-21 DIAGNOSIS — G8929 Other chronic pain: Secondary | ICD-10-CM

## 2022-11-21 DIAGNOSIS — M25512 Pain in left shoulder: Secondary | ICD-10-CM | POA: Diagnosis not present

## 2022-11-21 DIAGNOSIS — M6281 Muscle weakness (generalized): Secondary | ICD-10-CM

## 2022-11-21 NOTE — Therapy (Signed)
OUTPATIENT PHYSICAL THERAPY SHOULDER TREATMENT   Patient Name: Andrea Zhang MRN: [id number] DOB:[birth year], 62 y.o., female Today's Date: 11/21/2022  END OF SESSION:  PT End of Session - 11/21/22 1636     Visit Number 2    Number of Visits 13    Date for PT Re-Evaluation 12/26/22    PT Start Time 1046    PT Stop Time 1131    PT Time Calculation (min) 45 min             Past Medical History:  Diagnosis Date   Anemia    iron deficiency and b12 deficiency   Anxiety    Arthritis    Asthma    Diabetes mellitus without complication (HCC)    Fasciitis    left foot   GERD (gastroesophageal reflux disease)    History of kidney stones    Hypercholesterolemia    Hypertension    Migraines    MIGRAINES HAVE IMPROVED SINCE RECEIVING IRON   Past Surgical History:  Procedure Laterality Date   CHOLECYSTECTOMY  2004   COLONOSCOPY WITH ESOPHAGOGASTRODUODENOSCOPY (EGD)  02/2018   ESOPHAGOGASTRODUODENOSCOPY N/A 10/16/2020   Procedure: ESOPHAGOGASTRODUODENOSCOPY (EGD);  Surgeon: Regis Bill, MD;  Location: [facility] ENDOSCOPY;  Service: Endoscopy;  Laterality: N/A;   ESOPHAGOGASTRODUODENOSCOPY (EGD) WITH PROPOFOL N/A 06/06/2016   Procedure: ESOPHAGOGASTRODUODENOSCOPY (EGD) WITH PROPOFOL;  Surgeon: Scot Jun, MD;  Location: [facility] ENDOSCOPY;  Service: Endoscopy;  Laterality: N/A;   EXTRACORPOREAL SHOCK WAVE LITHOTRIPSY  2010   JOINT REPLACEMENT  2013   LT TKR   SAVORY DILATION N/A 06/06/2016   Procedure: SAVORY DILATION;  Surgeon: Scot Jun, MD;  Location: [facility] ENDOSCOPY;  Service: Endoscopy;  Laterality: N/A;   SAVORY DILATION  02/2018   SHOULDER ARTHROSCOPY WITH OPEN ROTATOR CUFF REPAIR Right 05/24/2018   Procedure: right shoulder arthroscopy, extensive arthroscopic debridement, decompression, open rotator cuff repair, biceps tenodesis;  Surgeon: Christena Flake, MD;  Location: [facility] ORS;  Service: Orthopedics;  Laterality: Right;   Patient Active Problem List    Diagnosis Date Noted   Coronary artery calcification 09/09/2019   Aortic atherosclerosis (HCC) 09/09/2019   Allergic rhinitis 08/06/2019   Asthma 08/06/2019   Diabetes mellitus, type 2 (HCC) 08/06/2019   Dysphagia 08/06/2019   GERD (gastroesophageal reflux disease) 08/06/2019   Hypercalcemia 08/06/2019   Hypertension 08/06/2019   Migraines 08/06/2019   Osteoarthritis 08/06/2019   Folate deficiency 03/03/2019   Morbid obesity with BMI of 40.0-44.9, adult (HCC) 06/05/2018   Status post right rotator cuff repair 06/05/2018   Degenerative joint disease of right shoulder 05/24/2018   Degenerative tear of glenoid labrum, right 05/24/2018   Injury of tendon of long head of right biceps 05/24/2018   Nontraumatic complete tear of right rotator cuff 05/14/2018   Rotator cuff tendinitis, right 05/14/2018   Iron deficiency anemia 01/15/2018   B12 deficiency 09/07/2017   Microalbuminuric diabetic nephropathy (HCC) 09/07/2017   Primary osteoarthritis of right knee 02/19/2015   Restless leg syndrome 10/25/2012   Hyperlipidemia with target LDL less than 100 05/20/2012    PCP: Dalia Heading, MD  REFERRING PROVIDER: Dedra Skeens, PA-C  REFERRING DIAG: Primary osteoarthritis of right knee   Chronic bursitis of left shoulder  THERAPY DIAG:  Chronic left shoulder pain  Shoulder joint stiffness, left  Muscle weakness (generalized)  Rationale for Evaluation and Treatment: Rehabilitation  ONSET DATE: 09/2022  SUBJECTIVE:  SUBJECTIVE STATEMENT: Pt. Had PT in 2019 s/p R RTC repair.  Pt. Known to PT clinic and currently c/o L shoulder discomfort/ R knee pain.  Pt. Planning to have R TKA in future but not ready at this time.  Pt. Had injection in R knee recently with benefit.  L TKA 10 years ago.  Pt. R hand  dominant.  Pt. Sleeps on back and is able to sleep on L shoulder with no issues.  2/10 L posterior shoulder pain at rest and 8/10 stabbing pain at worst in L shoulder.    PERTINENT HISTORY: Andrea Zhang, [place] - 11/09/2022 8:30 AM EST Formatting of this note is different from the original. Images from the original note were not included. Chief Complaint Chief Complaint Patient presents with Left Shoulder - Pain Right Knee - Pain  Reason for Visit The patient is a 62 year old female that presented for reevaluation of right knee. She has documented right knee degenerative joint disease. She had a left total knee replacement done by Dr. Gavin Potters about 10 years ago and has done very well. She has had cortisone injections in June 2023, and did well. The patient is requesting an injection in her right knee today. She is not interested in having knee surgery yet. She has to be careful with walking because of giving out symptoms. She has occasional sharp pain and popping in the right knee. She has swelling in the knee occasionally. The patient is a diabetic.  The patient is also here for reevaluation of her left shoulder. She is difficulty with getting dressed and overhead activities. She has pain on the side of her shoulder that is not going down the arm. She has no neck pain now. She has some biceps pain. She has no injury or trauma. She has continued doing exercises although has some discomfort. She denies any injury to the shoulder. She has no real weakness. She has no pain at nighttime or going behind her back. There is no numbness or tingling down the arm.  Medications Current Outpatient Medications Medication Sig Dispense Refill albuterol 90 mcg/actuation inhaler INHALE 2 INHALATIONS INTO THE LUNGS EVERY 4 HOURS AS NEEDED FOR WHEEZING 3 each 0 aspirin 81 MG EC tablet Take 81 mg by mouth daily. atenoloL (TENORMIN) 50 MG tablet TAKE 1 TABLET(50 MG) BY MOUTH EVERY DAY 90 tablet  1 blood-glucose sensor (FREESTYLE LIBRE 3 SENSOR) Devi Apply to skin every 14 days for glucose monitoring 6 each 1 cholecalciferol (VITAMIN D3) 2,000 unit tablet Take 2,000 Units by mouth once daily. cyclobenzaprine (FLEXERIL) 10 MG tablet Take 1 tablet (10 mg total) by mouth 3 (three) times daily for 180 days 270 tablet 1 eletriptan (RELPAX) 40 MG tablet May take a second dose after 2 hours if needed. Pharmacist: Give maximum amount of medicine allowed/month by patient's insurance. 9 tablet 2 EMGALITY PEN 120 mg/mL PnIj INJECT 120MG  INTO THE SKIN MONTHLY 1 mL 5 gabapentin (NEURONTIN) 800 MG tablet TAKE 1 TABLET(800 MG) BY MOUTH AT BEDTIME 90 tablet 1 hydroCHLOROthiazide (HYDRODIURIL) 12.5 MG tablet Take 1 tablet (12.5 mg total) by mouth once daily 90 tablet 1 meloxicam (MOBIC) 15 MG tablet Take 1 tablet (15 mg total) by mouth once daily for 30 days 30 tablet 3 mirtazapine (REMERON) 15 MG tablet Take 1 tablet (15 mg total) by mouth at bedtime 90 tablet 1 multivitamin tablet Take 1 tablet by mouth once daily naratriptan (AMERGE) 2.5 MG tablet TAKE 1 TABLET BY MOUTH ONCE AS NEEDED FOR MIGRAINE, ADMINISTER  DOSE AT ONSET OF SYMPTOMS, IF NO RELIEF, MAY REPEAT IN 2 HOURS 10 tablet 3 omeprazole (PRILOSEC) 40 MG DR capsule TAKE 1 CAPSULE(40 MG) BY MOUTH TWICE DAILY BEFORE MEALS 180 capsule 1 PREVIDENT 5000 BOOSTER PLUS 1.1 % dental paste Place onto teeth at bedtime promethazine (PHENERGAN) 25 MG tablet TAKE 1 TABLET(25 MG) BY MOUTH EVERY 8 HOURS AS NEEDED FOR NAUSEA 15 tablet 1 semaglutide (OZEMPIC) 1 mg/dose (4 mg/3 mL) pen injector Inject 0.75 mLs (1 mg total) subcutaneously once a week 9 mL 1 simvastatin (ZOCOR) 10 MG tablet Take 1 tablet (10 mg total) by mouth at bedtime 90 tablet 1 SUMAtriptan (IMITREX) 100 MG tablet TAKE 1 TABLET BY MOUTH AS DIRECTED FOR MIGRAINE, MAY TAKE A SECOND DOSE AFTER 2 HOURS, IF NEEDED 10 tablet 3   PAIN:  Are you having pain? Yes: NPRS scale: 2/10 Pain location: L  shoulder Pain description: stabbing/ sharp Aggravating factors: overhead reaching Relieving factors: rest  PRECAUTIONS: None  WEIGHT BEARING RESTRICTIONS: No  FALLS:  Has patient fallen in last 6 months? No  LIVING ENVIRONMENT: Lives with: lives with their spouse Lives in: House/apartment Has following equipment at home: NoneNone  OCCUPATION: Desk job/ computer work.    PLOF: Independent  PATIENT GOALS:  Increase L shoulder pain-free mobility/ prepare for R TKA  NEXT MD VISIT:   OBJECTIVE:   DIAGNOSTIC FINDINGS:  No L shoulder MRI  PATIENT SURVEYS:  FOTO initial 45/ goal 51  COGNITION: Overall cognitive status: Within functional limits for tasks assessed     SENSATION: WFL  POSTURE: Rounded shoulder/ B UT muscle tightness noted.   UPPER EXTREMITY ROM:   Active ROM Right eval Left eval  Shoulder flexion WNL 167 deg.  Shoulder extension WNL   Shoulder abduction WNL 165 deg.  Shoulder adduction WNL   Shoulder internal rotation WNL 85 deg.  Shoulder external rotation WNL 88 deg.  Elbow flexion WNL WNL  Elbow extension WNL WNL  Wrist flexion WNL WNL  Wrist extension    Wrist ulnar deviation    Wrist radial deviation    Wrist pronation    Wrist supination    (Blank rows = not tested)  Cervical: flexion (40 deg.), extension (52 deg.), L lat. Flex. (35 deg.), R lat. Flex. (35 deg.), L rotn. (72 deg.), R rotn. (69 deg.)  UPPER EXTREMITY MMT:  MMT Right eval Left eval  Shoulder flexion 4/5 3/5  Shoulder extension    Shoulder abduction 4/5 3/5  Shoulder adduction 5/5 5/5  Shoulder internal rotation 4/5 4/5  Shoulder external rotation 4/5 3/5  Middle trapezius    Lower trapezius    Elbow flexion 5/5 4+/5  Elbow extension 5/5 5/5  Wrist flexion    Wrist extension    Wrist ulnar deviation    Wrist radial deviation    Wrist pronation    Wrist supination    Grip strength (lbs) 61# 57#  (Blank rows = not tested)  SHOULDER SPECIAL  TESTS: Impingement tests: Neer impingement test: negative and Hawkins/Kennedy impingement test: negative Rotator cuff assessment: Empty can test: positive  and Jobe test (+) for L supraspinatus involvement  JOINT MOBILITY TESTING:  Good B shoulder capsular mobility.    PALPATION:  (+) L posterior deltoid/ supraspinatus tenderness   TODAY'S TREATMENT:  DATE: 11/21/22  Subjective: Pt. Reports no new complaints.  Pt. Trying to avoid using L shoulder with household tasks.  Pt. Had to push Bowflex unit in boxes on driveway with use of R UE more than L UE.    There.ex.:  Seated B UBE: 2 min. F/b (10 minutes total).   Reviewed HEP Seated/ supine B shoulder AROM (all planes). Supine serratus punches with 2# wt. 10x2.  Manual tx.:  Supine L shoulder joint capsule reassessment (good anterior/ posterior capsular mobility).    Supine L shoulder IR/ER at 90 deg. Sh. Abduction position.  Pain limited with L shoulder ER with added OP.    PATIENT EDUCATION: Education details: Access Code: [email or web address] Person educated: Patient Education method: Explanation, Demonstration, and Handouts Education comprehension: verbalized understanding and returned demonstration  HOME EXERCISE PROGRAM: Access Code: [email or web address] URL: [email or web address] Date: 11/14/2022 Prepared by: Dorene Grebe   Exercises - Standing Isometric Shoulder Flexion with Doorway - Arm Bent  - 1 x daily - 7 x weekly - 1 sets - 10 reps - 10 seconds hold - Standing Isometric Shoulder External Rotation with Doorway  - 1 x daily - 7 x weekly - 1 sets - 10 reps - 10 seconds hold - Standing Isometric Shoulder Extension with Doorway - Arm Bent  - 1 x daily - 7 x weekly - 1 sets - 10 reps - 10 seconds hold - Seated Scapular Retraction  - 1 x daily - 7 x weekly - 1 sets - 10 reps - 10 seconds  hold  ASSESSMENT:  CLINICAL IMPRESSION: Pt. Demonstrates good technique/ understanding of HEP.  No changes to HEP at this time and PT will issue a Bowflex program next visit.  Pt. Presents with good L shoulder AROM (all planes) with moderate muscle weakness in L as compared to R shoulder/ UE.  Good L shoulder capsular mobility (all planes).  Pt. Will benefit from skilled PT services to increase L shoulder AROM/ strength to improve pain-free mobility    OBJECTIVE IMPAIRMENTS: decreased endurance, decreased mobility, decreased ROM, decreased strength, impaired UE functional use, improper body mechanics, postural dysfunction, and pain.   ACTIVITY LIMITATIONS: carrying, lifting, reach over head, and hygiene/grooming  PARTICIPATION LIMITATIONS: meal prep, cleaning, laundry, driving, shopping, and community activity  PERSONAL FACTORS: Past/current experiences are also affecting patient's functional outcome.   REHAB POTENTIAL: Good  CLINICAL DECISION MAKING: Stable/uncomplicated  EVALUATION COMPLEXITY: Low   GOALS: Goals reviewed with patient? Yes  SHORT TERM GOALS: Target date: 12/05/22  Pt. Independent with HEP to increase L shoulder AROM to WNL as compared to R shoulder with no increase c/o pain.   Baseline: see above Goal status: INITIAL   LONG TERM GOALS: Target date: 12/26/22  Pt. Will increase FOTO to 61 to improve pain-free mobility.   Baseline: initial 45 Goal status: INITIAL  2.  Pt. Will report <4/10 L shoulder pain worst with overhead reaching/ lifting tasks.   Baseline: 8/10 L shoulder stabbing pain at worst.   Goal status: INITIAL  3.  Pt. Will increase L shoulder strength 1/2 muscle grade to improve lifting/ carrying tasks.  Baseline: See above Goal status: INITIAL  4.  Pt. Will complete 30 minutes of there.ex./ gym based ex with no increase c/o L shoulder/ R knee pain to prepare for future R TKA.    Baseline: currently not exercising  Goal status:  INITIAL   PLAN:  PT FREQUENCY: 2x/week  PT DURATION: 6 weeks  PLANNED INTERVENTIONS: Therapeutic exercises, Therapeutic activity, Neuromuscular re-education,  Patient/Family education, Self Care, Joint mobilization, Dry Needling, Electrical stimulation, Cryotherapy, Moist heat, Manual therapy, and Re-evaluation  PLAN FOR NEXT SESSION: Bowflex ex. Program.    Cammie Mcgee, PT, DPT # [phone] 11/21/2022, 4:38 PM

## 2022-11-24 ENCOUNTER — Encounter: Payer: Self-pay | Admitting: Physical Therapy

## 2022-11-24 ENCOUNTER — Ambulatory Visit: Payer: BC Managed Care – PPO | Admitting: Physical Therapy

## 2022-11-24 DIAGNOSIS — M25612 Stiffness of left shoulder, not elsewhere classified: Secondary | ICD-10-CM

## 2022-11-24 DIAGNOSIS — M6281 Muscle weakness (generalized): Secondary | ICD-10-CM

## 2022-11-24 DIAGNOSIS — M25512 Pain in left shoulder: Secondary | ICD-10-CM | POA: Diagnosis not present

## 2022-11-24 DIAGNOSIS — G8929 Other chronic pain: Secondary | ICD-10-CM

## 2022-11-24 NOTE — Therapy (Signed)
OUTPATIENT PHYSICAL THERAPY SHOULDER TREATMENT   Patient Name: Andrea Zhang MRN: [id number] DOB:1961/06/12, 62 y.o., female Today's Date: 11/24/2022  END OF SESSION:  PT End of Session - 11/24/22 1349     Visit Number 3    Number of Visits 13    Date for PT Re-Evaluation 12/26/22    PT Start Time 1349    PT Stop Time 1442    PT Time Calculation (min) 53 min    Activity Tolerance Patient tolerated treatment well    Behavior During Therapy WFL for tasks assessed/performed             Past Medical History:  Diagnosis Date   Anemia    iron deficiency and b12 deficiency   Anxiety    Arthritis    Asthma    Diabetes mellitus without complication (HCC)    Fasciitis    left foot   GERD (gastroesophageal reflux disease)    History of kidney stones    Hypercholesterolemia    Hypertension    Migraines    MIGRAINES HAVE IMPROVED SINCE RECEIVING IRON   Past Surgical History:  Procedure Laterality Date   CHOLECYSTECTOMY  2004   COLONOSCOPY WITH ESOPHAGOGASTRODUODENOSCOPY (EGD)  02/2018   ESOPHAGOGASTRODUODENOSCOPY N/A 10/16/2020   Procedure: ESOPHAGOGASTRODUODENOSCOPY (EGD);  Surgeon: Regis Bill, MD;  Location: [facility] ENDOSCOPY;  Service: Endoscopy;  Laterality: N/A;   ESOPHAGOGASTRODUODENOSCOPY (EGD) WITH PROPOFOL N/A 06/06/2016   Procedure: ESOPHAGOGASTRODUODENOSCOPY (EGD) WITH PROPOFOL;  Surgeon: Scot Jun, MD;  Location: [facility] ENDOSCOPY;  Service: Endoscopy;  Laterality: N/A;   EXTRACORPOREAL SHOCK WAVE LITHOTRIPSY  2010   JOINT REPLACEMENT  2013   LT TKR   SAVORY DILATION N/A 06/06/2016   Procedure: SAVORY DILATION;  Surgeon: Scot Jun, MD;  Location: [facility] ENDOSCOPY;  Service: Endoscopy;  Laterality: N/A;   SAVORY DILATION  02/2018   SHOULDER ARTHROSCOPY WITH OPEN ROTATOR CUFF REPAIR Right 05/24/2018   Procedure: right shoulder arthroscopy, extensive arthroscopic debridement, decompression, open rotator cuff repair, biceps tenodesis;  Surgeon:  Christena Flake, MD;  Location: [facility] ORS;  Service: Orthopedics;  Laterality: Right;   Patient Active Problem List   Diagnosis Date Noted   Coronary artery calcification 09/09/2019   Aortic atherosclerosis (HCC) 09/09/2019   Allergic rhinitis 08/06/2019   Asthma 08/06/2019   Diabetes mellitus, type 2 (HCC) 08/06/2019   Dysphagia 08/06/2019   GERD (gastroesophageal reflux disease) 08/06/2019   Hypercalcemia 08/06/2019   Hypertension 08/06/2019   Migraines 08/06/2019   Osteoarthritis 08/06/2019   Folate deficiency 03/03/2019   Morbid obesity with BMI of 40.0-44.9, adult (HCC) 06/05/2018   Status post right rotator cuff repair 06/05/2018   Degenerative joint disease of right shoulder 05/24/2018   Degenerative tear of glenoid labrum, right 05/24/2018   Injury of tendon of long head of right biceps 05/24/2018   Nontraumatic complete tear of right rotator cuff 05/14/2018   Rotator cuff tendinitis, right 05/14/2018   Iron deficiency anemia 01/15/2018   B12 deficiency 09/07/2017   Microalbuminuric diabetic nephropathy (HCC) 09/07/2017   Primary osteoarthritis of right knee 02/19/2015   Restless leg syndrome 10/25/2012   Hyperlipidemia with target LDL less than 100 05/20/2012    PCP: Dalia Heading, MD  REFERRING PROVIDER: Dedra Skeens, PA-C  REFERRING DIAG: Primary osteoarthritis of right knee   Chronic bursitis of left shoulder  THERAPY DIAG:  Chronic left shoulder pain  Stiffness of left shoulder joint  Muscle weakness (generalized)  Chronic pain of right knee  Rationale for Evaluation and  Treatment: Rehabilitation  ONSET DATE: 09/2022  SUBJECTIVE:                                                                                                                                                                                      SUBJECTIVE STATEMENT: Pt. Had PT in 2019 s/p R RTC repair.  Pt. Known to PT clinic and currently c/o L shoulder discomfort/ R knee pain.  Pt.  Planning to have R TKA in future but not ready at this time.  Pt. Had injection in R knee recently with benefit.  L TKA 10 years ago.  Pt. R hand dominant.  Pt. Sleeps on back and is able to sleep on L shoulder with no issues.  2/10 L posterior shoulder pain at rest and 8/10 stabbing pain at worst in L shoulder.    PERTINENT HISTORY: [name], [place] - 11/09/2022 8:30 AM EST Formatting of this note is different from the original. Images from the original note were not included. Chief Complaint Chief Complaint Patient presents with Left Shoulder - Pain Right Knee - Pain  Reason for Visit The patient is a 62 year old female that presented for reevaluation of right knee. She has documented right knee degenerative joint disease. She had a left total knee replacement done by Dr. Gavin Potters about 10 years ago and has done very well. She has had cortisone injections in June 2023, and did well. The patient is requesting an injection in her right knee today. She is not interested in having knee surgery yet. She has to be careful with walking because of giving out symptoms. She has occasional sharp pain and popping in the right knee. She has swelling in the knee occasionally. The patient is a diabetic.  The patient is also here for reevaluation of her left shoulder. She is difficulty with getting dressed and overhead activities. She has pain on the side of her shoulder that is not going down the arm. She has no neck pain now. She has some biceps pain. She has no injury or trauma. She has continued doing exercises although has some discomfort. She denies any injury to the shoulder. She has no real weakness. She has no pain at nighttime or going behind her back. There is no numbness or tingling down the arm.  Medications Current Outpatient Medications Medication Sig Dispense Refill albuterol 90 mcg/actuation inhaler INHALE 2 INHALATIONS INTO THE LUNGS EVERY 4 HOURS AS NEEDED FOR WHEEZING 3 each  0 aspirin 81 MG EC tablet Take 81 mg by mouth daily. atenoloL (TENORMIN) 50 MG tablet TAKE 1 TABLET(50 MG) BY MOUTH EVERY DAY 90 tablet 1 blood-glucose sensor (FREESTYLE LIBRE 3 SENSOR)  Devi Apply to skin every 14 days for glucose monitoring 6 each 1 cholecalciferol (VITAMIN D3) 2,000 unit tablet Take 2,000 Units by mouth once daily. cyclobenzaprine (FLEXERIL) 10 MG tablet Take 1 tablet (10 mg total) by mouth 3 (three) times daily for 180 days 270 tablet 1 eletriptan (RELPAX) 40 MG tablet May take a second dose after 2 hours if needed. Pharmacist: Give maximum amount of medicine allowed/month by patient's insurance. 9 tablet 2 EMGALITY PEN 120 mg/mL PnIj INJECT 120MG  INTO THE SKIN MONTHLY 1 mL 5 gabapentin (NEURONTIN) 800 MG tablet TAKE 1 TABLET(800 MG) BY MOUTH AT BEDTIME 90 tablet 1 hydroCHLOROthiazide (HYDRODIURIL) 12.5 MG tablet Take 1 tablet (12.5 mg total) by mouth once daily 90 tablet 1 meloxicam (MOBIC) 15 MG tablet Take 1 tablet (15 mg total) by mouth once daily for 30 days 30 tablet 3 mirtazapine (REMERON) 15 MG tablet Take 1 tablet (15 mg total) by mouth at bedtime 90 tablet 1 multivitamin tablet Take 1 tablet by mouth once daily naratriptan (AMERGE) 2.5 MG tablet TAKE 1 TABLET BY MOUTH ONCE AS NEEDED FOR MIGRAINE, ADMINISTER DOSE AT ONSET OF SYMPTOMS, IF NO RELIEF, MAY REPEAT IN 2 HOURS 10 tablet 3 omeprazole (PRILOSEC) 40 MG DR capsule TAKE 1 CAPSULE(40 MG) BY MOUTH TWICE DAILY BEFORE MEALS 180 capsule 1 PREVIDENT 5000 BOOSTER PLUS 1.1 % dental paste Place onto teeth at bedtime promethazine (PHENERGAN) 25 MG tablet TAKE 1 TABLET(25 MG) BY MOUTH EVERY 8 HOURS AS NEEDED FOR NAUSEA 15 tablet 1 semaglutide (OZEMPIC) 1 mg/dose (4 mg/3 mL) pen injector Inject 0.75 mLs (1 mg total) subcutaneously once a week 9 mL 1 simvastatin (ZOCOR) 10 MG tablet Take 1 tablet (10 mg total) by mouth at bedtime 90 tablet 1 SUMAtriptan (IMITREX) 100 MG tablet TAKE 1 TABLET BY MOUTH AS DIRECTED FOR MIGRAINE,  MAY TAKE A SECOND DOSE AFTER 2 HOURS, IF NEEDED 10 tablet 3   PAIN:  Are you having pain? Yes: NPRS scale: 2/10 Pain location: L shoulder Pain description: stabbing/ sharp Aggravating factors: overhead reaching Relieving factors: rest  PRECAUTIONS: None  WEIGHT BEARING RESTRICTIONS: No  FALLS:  Has patient fallen in last 6 months? No  LIVING ENVIRONMENT: Lives with: lives with their spouse Lives in: House/apartment Has following equipment at home: NoneNone  OCCUPATION: Desk job/ computer work.    PLOF: Independent  PATIENT GOALS:  Increase L shoulder pain-free mobility/ prepare for R TKA  NEXT MD VISIT:   OBJECTIVE:   DIAGNOSTIC FINDINGS:  No L shoulder MRI  PATIENT SURVEYS:  FOTO initial 45/ goal 29  COGNITION: Overall cognitive status: Within functional limits for tasks assessed     SENSATION: WFL  POSTURE: Rounded shoulder/ B UT muscle tightness noted.   UPPER EXTREMITY ROM:   Active ROM Right eval Left eval  Shoulder flexion WNL 167 deg.  Shoulder extension WNL   Shoulder abduction WNL 165 deg.  Shoulder adduction WNL   Shoulder internal rotation WNL 85 deg.  Shoulder external rotation WNL 88 deg.  Elbow flexion WNL WNL  Elbow extension WNL WNL  Wrist flexion WNL WNL  Wrist extension    Wrist ulnar deviation    Wrist radial deviation    Wrist pronation    Wrist supination    (Blank rows = not tested)  Cervical: flexion (40 deg.), extension (52 deg.), L lat. Flex. (35 deg.), R lat. Flex. (35 deg.), L rotn. (72 deg.), R rotn. (69 deg.)  UPPER EXTREMITY MMT:  MMT Right eval Left eval  Shoulder  flexion 4/5 3/5  Shoulder extension    Shoulder abduction 4/5 3/5  Shoulder adduction 5/5 5/5  Shoulder internal rotation 4/5 4/5  Shoulder external rotation 4/5 3/5  Middle trapezius    Lower trapezius    Elbow flexion 5/5 4+/5  Elbow extension 5/5 5/5  Wrist flexion    Wrist extension    Wrist ulnar deviation    Wrist radial deviation     Wrist pronation    Wrist supination    Grip strength (lbs) 61# 57#  (Blank rows = not tested)  SHOULDER SPECIAL TESTS: Impingement tests: Neer impingement test: negative and Hawkins/Kennedy impingement test: negative Rotator cuff assessment: Empty can test: positive  and Jobe test (+) for L supraspinatus involvement  JOINT MOBILITY TESTING:  Good B shoulder capsular mobility.    PALPATION:  (+) L posterior deltoid/ supraspinatus tenderness   TODAY'S TREATMENT:                                                                                                                                         DATE: 11/24/22  Subjective: Pt. States low level pain upon arrival to PT of 1/10. Pt. States some muscle soreness after last session that resolved within a few days. Pt. Had check up with PCP with nothing remarkable to report back. Pt. Reports using R arm more than L to avoid increasing sx. And pain. Pt. Reports compliance and no problems with HEP. Pt. Plans to start bowflex workout regimen soon at home.   There.ex.:  Seated B UBE: 3 min. F/b (6 minutes total).    Sitting ER/IR/Ext. Isometric (moderate) force applied by PT. 2 x 10 Each  B Supine skull crushers 3# 2x15 reps  Sitting overhead tricep ext. 2# 2x15 reps  Nautilus: scapular retraction (handles) 20# 2 x15 reps, Standing lat pull downs (handles) 30# 2x15 reps, standing shoulder abd./add. (handles) 20# 2x15  Discussed HEP   PATIENT EDUCATION: Education details: Access Code: [email or web address] Person educated: Patient Education method: Explanation, Demonstration, and Handouts Education comprehension: verbalized understanding and returned demonstration  HOME EXERCISE PROGRAM: Access Code: [email or web address] URL: [email or web address] Date: 11/14/2022 Prepared by: Dorene Grebe   Exercises - Standing Isometric Shoulder Flexion with Doorway - Arm Bent  - 1 x daily - 7 x weekly - 1 sets - 10 reps - 10 seconds hold - Standing  Isometric Shoulder External Rotation with Doorway  - 1 x daily - 7 x weekly - 1 sets - 10 reps - 10 seconds hold - Standing Isometric Shoulder Extension with Doorway - Arm Bent  - 1 x daily - 7 x weekly - 1 sets - 10 reps - 10 seconds hold - Seated Scapular Retraction  - 1 x daily - 7 x weekly - 1 sets - 10 reps - 10 seconds hold  ASSESSMENT:  CLINICAL IMPRESSION: Pt. Demonstrates good technique/ understanding of HEP. Pt. Presents with low pain but  decreased strength in L scapular muscles compared to R. Pt. Requested assistance with bowflex HEP and PT performed example exercises with pt. During treatment session. Pt. Requires cues to have good technique in scapular exercises. Pt. Will benefit from skilled PT services to increase L shoulder AROM/ strength to improve pain-free mobility.    OBJECTIVE IMPAIRMENTS: decreased endurance, decreased mobility, decreased ROM, decreased strength, impaired UE functional use, improper body mechanics, postural dysfunction, and pain.   ACTIVITY LIMITATIONS: carrying, lifting, reach over head, and hygiene/grooming  PARTICIPATION LIMITATIONS: meal prep, cleaning, laundry, driving, shopping, and community activity  PERSONAL FACTORS: Past/current experiences are also affecting patient's functional outcome.   REHAB POTENTIAL: Good  CLINICAL DECISION MAKING: Stable/uncomplicated  EVALUATION COMPLEXITY: Low   GOALS: Goals reviewed with patient? Yes  SHORT TERM GOALS: Target date: 12/05/22  Pt. Independent with HEP to increase L shoulder AROM to WNL as compared to R shoulder with no increase c/o pain.   Baseline: see above Goal status: INITIAL   LONG TERM GOALS: Target date: 12/26/22  Pt. Will increase FOTO to 61 to improve pain-free mobility.   Baseline: initial 45 Goal status: INITIAL  2.  Pt. Will report <4/10 L shoulder pain worst with overhead reaching/ lifting tasks.   Baseline: 8/10 L shoulder stabbing pain at worst.   Goal status:  INITIAL  3.  Pt. Will increase L shoulder strength 1/2 muscle grade to improve lifting/ carrying tasks.  Baseline: See above Goal status: INITIAL  4.  Pt. Will complete 30 minutes of there.ex./ gym based ex with no increase c/o L shoulder/ R knee pain to prepare for future R TKA.    Baseline: currently not exercising  Goal status: INITIAL   PLAN:  PT FREQUENCY: 2x/week  PT DURATION: 6 weeks  PLANNED INTERVENTIONS: Therapeutic exercises, Therapeutic activity, Neuromuscular re-education, Patient/Family education, Self Care, Joint mobilization, Dry Needling, Electrical stimulation, Cryotherapy, Moist heat, Manual therapy, and Re-evaluation  PLAN FOR NEXT SESSION: Bowflex ex. Program/ Reassess R knee   Cammie Mcgee, PT, DPT # [phone] Everlene Other 11/24/2022, 6:02 PM

## 2022-11-28 ENCOUNTER — Other Ambulatory Visit: Payer: Self-pay | Admitting: Nurse Practitioner

## 2022-11-28 DIAGNOSIS — Z1231 Encounter for screening mammogram for malignant neoplasm of breast: Secondary | ICD-10-CM

## 2022-11-29 ENCOUNTER — Encounter: Payer: Self-pay | Admitting: Physical Therapy

## 2022-11-29 ENCOUNTER — Ambulatory Visit: Payer: BC Managed Care – PPO | Admitting: Physical Therapy

## 2022-11-29 DIAGNOSIS — M6281 Muscle weakness (generalized): Secondary | ICD-10-CM

## 2022-11-29 DIAGNOSIS — G8929 Other chronic pain: Secondary | ICD-10-CM

## 2022-11-29 DIAGNOSIS — M25612 Stiffness of left shoulder, not elsewhere classified: Secondary | ICD-10-CM

## 2022-11-29 DIAGNOSIS — M25512 Pain in left shoulder: Secondary | ICD-10-CM | POA: Diagnosis not present

## 2022-11-29 NOTE — Therapy (Signed)
OUTPATIENT PHYSICAL THERAPY SHOULDER TREATMENT   Patient Name: Andrea Zhang MRN: [id number] DOB:[birth year], 62 y.o., female Today's Date: 11/29/2022  END OF SESSION:  PT End of Session - 11/29/22 1032     Visit Number 4    Number of Visits 13    Date for PT Re-Evaluation 12/26/22    PT Start Time 1032    PT Stop Time 1136    PT Time Calculation (min) 64 min    Activity Tolerance Patient tolerated treatment well;Patient limited by pain    Behavior During Therapy [facility] for tasks assessed/performed             Past Medical History:  Diagnosis Date   Anemia    iron deficiency and b12 deficiency   Anxiety    Arthritis    Asthma    Diabetes mellitus without complication (HCC)    Fasciitis    left foot   GERD (gastroesophageal reflux disease)    History of kidney stones    Hypercholesterolemia    Hypertension    Migraines    MIGRAINES HAVE IMPROVED SINCE RECEIVING IRON   Past Surgical History:  Procedure Laterality Date   CHOLECYSTECTOMY  2004   COLONOSCOPY WITH ESOPHAGOGASTRODUODENOSCOPY (EGD)  02/2018   ESOPHAGOGASTRODUODENOSCOPY N/A 10/16/2020   Procedure: ESOPHAGOGASTRODUODENOSCOPY (EGD);  Surgeon: Regis Bill, MD;  Location: [facility] ENDOSCOPY;  Service: Endoscopy;  Laterality: N/A;   ESOPHAGOGASTRODUODENOSCOPY (EGD) WITH PROPOFOL N/A 06/06/2016   Procedure: ESOPHAGOGASTRODUODENOSCOPY (EGD) WITH PROPOFOL;  Surgeon: Scot Jun, MD;  Location: [facility] ENDOSCOPY;  Service: Endoscopy;  Laterality: N/A;   EXTRACORPOREAL SHOCK WAVE LITHOTRIPSY  2010   JOINT REPLACEMENT  2013   LT TKR   SAVORY DILATION N/A 06/06/2016   Procedure: SAVORY DILATION;  Surgeon: Scot Jun, MD;  Location: [facility] ENDOSCOPY;  Service: Endoscopy;  Laterality: N/A;   SAVORY DILATION  02/2018   SHOULDER ARTHROSCOPY WITH OPEN ROTATOR CUFF REPAIR Right 05/24/2018   Procedure: right shoulder arthroscopy, extensive arthroscopic debridement, decompression, open rotator cuff repair, biceps  tenodesis;  Surgeon: Christena Flake, MD;  Location: [facility] ORS;  Service: Orthopedics;  Laterality: Right;   Patient Active Problem List   Diagnosis Date Noted   Coronary artery calcification 09/09/2019   Aortic atherosclerosis (HCC) 09/09/2019   Allergic rhinitis 08/06/2019   Asthma 08/06/2019   Diabetes mellitus, type 2 (HCC) 08/06/2019   Dysphagia 08/06/2019   GERD (gastroesophageal reflux disease) 08/06/2019   Hypercalcemia 08/06/2019   Hypertension 08/06/2019   Migraines 08/06/2019   Osteoarthritis 08/06/2019   Folate deficiency 03/03/2019   Morbid obesity with BMI of 40.0-44.9, adult (HCC) 06/05/2018   Status post right rotator cuff repair 06/05/2018   Degenerative joint disease of right shoulder 05/24/2018   Degenerative tear of glenoid labrum, right 05/24/2018   Injury of tendon of long head of right biceps 05/24/2018   Nontraumatic complete tear of right rotator cuff 05/14/2018   Rotator cuff tendinitis, right 05/14/2018   Iron deficiency anemia 01/15/2018   B12 deficiency 09/07/2017   Microalbuminuric diabetic nephropathy (HCC) 09/07/2017   Primary osteoarthritis of right knee 02/19/2015   Restless leg syndrome 10/25/2012   Hyperlipidemia with target LDL less than 100 05/20/2012    PCP: Dalia Heading, MD  REFERRING PROVIDER: Dedra Skeens, PA-C  REFERRING DIAG: Primary osteoarthritis of right knee   Chronic bursitis of left shoulder  THERAPY DIAG:  Chronic left shoulder pain  Shoulder joint stiffness, left  Muscle weakness (generalized)  Rationale for Evaluation and Treatment: Rehabilitation  ONSET  DATE: 09/2022  SUBJECTIVE:                                                                                                                                                                                      SUBJECTIVE STATEMENT: Pt. Had PT in 2019 s/p R RTC repair.  Pt. Known to PT clinic and currently c/o L shoulder discomfort/ R knee pain.  Pt. Planning to  have R TKA in future but not ready at this time.  Pt. Had injection in R knee recently with benefit.  L TKA 10 years ago.  Pt. R hand dominant.  Pt. Sleeps on back and is able to sleep on L shoulder with no issues.  2/10 L posterior shoulder pain at rest and 8/10 stabbing pain at worst in L shoulder.    PERTINENT HISTORY: [name], [place] - 11/09/2022 8:30 AM EST Formatting of this note is different from the original. Images from the original note were not included. Chief Complaint Chief Complaint Patient presents with Left Shoulder - Pain Right Knee - Pain  Reason for Visit The patient is a 62 year old female that presented for reevaluation of right knee. She has documented right knee degenerative joint disease. She had a left total knee replacement done by Dr. Gavin Potters about 10 years ago and has done very well. She has had cortisone injections in June 2023, and did well. The patient is requesting an injection in her right knee today. She is not interested in having knee surgery yet. She has to be careful with walking because of giving out symptoms. She has occasional sharp pain and popping in the right knee. She has swelling in the knee occasionally. The patient is a diabetic.  The patient is also here for reevaluation of her left shoulder. She is difficulty with getting dressed and overhead activities. She has pain on the side of her shoulder that is not going down the arm. She has no neck pain now. She has some biceps pain. She has no injury or trauma. She has continued doing exercises although has some discomfort. She denies any injury to the shoulder. She has no real weakness. She has no pain at nighttime or going behind her back. There is no numbness or tingling down the arm.  Medications Current Outpatient Medications Medication Sig Dispense Refill albuterol 90 mcg/actuation inhaler INHALE 2 INHALATIONS INTO THE LUNGS EVERY 4 HOURS AS NEEDED FOR WHEEZING 3 each 0 aspirin 81 MG EC  tablet Take 81 mg by mouth daily. atenoloL (TENORMIN) 50 MG tablet TAKE 1 TABLET(50 MG) BY MOUTH EVERY DAY 90 tablet 1 blood-glucose sensor (FREESTYLE LIBRE 3 SENSOR) Devi Apply to skin  every 14 days for glucose monitoring 6 each 1 cholecalciferol (VITAMIN D3) 2,000 unit tablet Take 2,000 Units by mouth once daily. cyclobenzaprine (FLEXERIL) 10 MG tablet Take 1 tablet (10 mg total) by mouth 3 (three) times daily for 180 days 270 tablet 1 eletriptan (RELPAX) 40 MG tablet May take a second dose after 2 hours if needed. Pharmacist: Give maximum amount of medicine allowed/month by patient's insurance. 9 tablet 2 EMGALITY PEN 120 mg/mL PnIj INJECT 120MG  INTO THE SKIN MONTHLY 1 mL 5 gabapentin (NEURONTIN) 800 MG tablet TAKE 1 TABLET(800 MG) BY MOUTH AT BEDTIME 90 tablet 1 hydroCHLOROthiazide (HYDRODIURIL) 12.5 MG tablet Take 1 tablet (12.5 mg total) by mouth once daily 90 tablet 1 meloxicam (MOBIC) 15 MG tablet Take 1 tablet (15 mg total) by mouth once daily for 30 days 30 tablet 3 mirtazapine (REMERON) 15 MG tablet Take 1 tablet (15 mg total) by mouth at bedtime 90 tablet 1 multivitamin tablet Take 1 tablet by mouth once daily naratriptan (AMERGE) 2.5 MG tablet TAKE 1 TABLET BY MOUTH ONCE AS NEEDED FOR MIGRAINE, ADMINISTER DOSE AT ONSET OF SYMPTOMS, IF NO RELIEF, MAY REPEAT IN 2 HOURS 10 tablet 3 omeprazole (PRILOSEC) 40 MG DR capsule TAKE 1 CAPSULE(40 MG) BY MOUTH TWICE DAILY BEFORE MEALS 180 capsule 1 PREVIDENT 5000 BOOSTER PLUS 1.1 % dental paste Place onto teeth at bedtime promethazine (PHENERGAN) 25 MG tablet TAKE 1 TABLET(25 MG) BY MOUTH EVERY 8 HOURS AS NEEDED FOR NAUSEA 15 tablet 1 semaglutide (OZEMPIC) 1 mg/dose (4 mg/3 mL) pen injector Inject 0.75 mLs (1 mg total) subcutaneously once a week 9 mL 1 simvastatin (ZOCOR) 10 MG tablet Take 1 tablet (10 mg total) by mouth at bedtime 90 tablet 1 SUMAtriptan (IMITREX) 100 MG tablet TAKE 1 TABLET BY MOUTH AS DIRECTED FOR MIGRAINE, MAY TAKE A SECOND  DOSE AFTER 2 HOURS, IF NEEDED 10 tablet 3   PAIN:  Are you having pain? Yes: NPRS scale: 2/10 Pain location: L shoulder Pain description: stabbing/ sharp Aggravating factors: overhead reaching Relieving factors: rest  PRECAUTIONS: None  WEIGHT BEARING RESTRICTIONS: No  FALLS:  Has patient fallen in last 6 months? No  LIVING ENVIRONMENT: Lives with: lives with their spouse Lives in: House/apartment Has following equipment at home: NoneNone  OCCUPATION: Desk job/ computer work.    PLOF: Independent  PATIENT GOALS:  Increase L shoulder pain-free mobility/ prepare for R TKA  NEXT MD VISIT:   OBJECTIVE:   DIAGNOSTIC FINDINGS:  No L shoulder MRI  PATIENT SURVEYS:  FOTO initial 45/ goal 73  COGNITION: Overall cognitive status: Within functional limits for tasks assessed     SENSATION: WFL  POSTURE: Rounded shoulder/ B UT muscle tightness noted.   UPPER EXTREMITY ROM:   Active ROM Right eval Left eval  Shoulder flexion WNL 167 deg.  Shoulder extension WNL   Shoulder abduction WNL 165 deg.  Shoulder adduction WNL   Shoulder internal rotation WNL 85 deg.  Shoulder external rotation WNL 88 deg.  Elbow flexion WNL WNL  Elbow extension WNL WNL  Wrist flexion WNL WNL  Wrist extension    Wrist ulnar deviation    Wrist radial deviation    Wrist pronation    Wrist supination    (Blank rows = not tested)  Cervical: flexion (40 deg.), extension (52 deg.), L lat. Flex. (35 deg.), R lat. Flex. (35 deg.), L rotn. (72 deg.), R rotn. (69 deg.)  UPPER EXTREMITY MMT:  MMT Right eval Left eval  Shoulder flexion 4/5 3/5  Shoulder extension    Shoulder abduction 4/5 3/5  Shoulder adduction 5/5 5/5  Shoulder internal rotation 4/5 4/5  Shoulder external rotation 4/5 3/5  Middle trapezius    Lower trapezius    Elbow flexion 5/5 4+/5  Elbow extension 5/5 5/5  Wrist flexion    Wrist extension    Wrist ulnar deviation    Wrist radial deviation    Wrist pronation     Wrist supination    Grip strength (lbs) 61# 57#  (Blank rows = not tested)  SHOULDER SPECIAL TESTS: Impingement tests: Neer impingement test: negative and Hawkins/Kennedy impingement test: negative Rotator cuff assessment: Empty can test: positive  and Jobe test (+) for L supraspinatus involvement  JOINT MOBILITY TESTING:  Good B shoulder capsular mobility.    PALPATION:  (+) L posterior deltoid/ supraspinatus tenderness   TODAY'S TREATMENT:                                                                                                                                         DATE: 11/29/22  Subjective: Pt. Came into PT clinic with arm sling donned due to an injury the previous day. Pt. States she was pulling the faucet when she heard an audible pop of her ant. L shoulder. She states her pain is 2/10 on NPS at rest and 8/10 NPS with flexion movement. Pt. Has concerns of R knee function and reports 0/10 pain with rest and 1/10 pain with movement.   There.ex.:  NuStep L4 10 min. Only LE due to shoulder pain.   Mini squats using outside of //-bar for UE balance 3 x 10 reps. Chair behind pt. For safety and cuing for depth of squat.   Resisted single banded walking in //-bars. PT. Cued to focus on quad contraction and controlling gait. 4 laps down and back.   Sitting LAQ 4lb. Ankle weights 3x10 reps.  Blue banded lateral hip abduction in agility ladder for distance landmark. 3 laps (down and back).   Discussed Bowflex exercise program, highlighted handout for suggested exercises.    PATIENT EDUCATION: Education details: Access Code: [email or web address] Person educated: Patient Education method: Explanation, Demonstration, and Handouts Education comprehension: verbalized understanding and returned demonstration  HOME EXERCISE PROGRAM: Access Code: [email or web address] URL: [email or web address] Date: 11/14/2022 Prepared by: Dorene Grebe   Exercises - Standing Isometric Shoulder  Flexion with Doorway - Arm Bent  - 1 x daily - 7 x weekly - 1 sets - 10 reps - 10 seconds hold - Standing Isometric Shoulder External Rotation with Doorway  - 1 x daily - 7 x weekly - 1 sets - 10 reps - 10 seconds hold - Standing Isometric Shoulder Extension with Doorway - Arm Bent  - 1 x daily - 7 x weekly - 1 sets - 10 reps - 10 seconds hold - Seated Scapular Retraction  - 1 x daily - 7 x weekly - 1  sets - 10 reps - 10 seconds hold  ASSESSMENT:  CLINICAL IMPRESSION: PT refrained from shoulder exercises due to incident involving L shoulder the previous day. PT will give the shoulder a few days rest before following up to re-assess sx./pain level. Pt. Has knee pain affecting functional ADL's and will benefit from strengthening to improve overall function and mobility. Pt. Was able to perform LE activities with good form requiring some cuing to have toes straight, head up, and keep a stable BOS. Pt. Will benefit from skilled PT services to increase L shoulder/ R knee AROM strength to improve pain-free mobility.  OBJECTIVE IMPAIRMENTS: decreased endurance, decreased mobility, decreased ROM, decreased strength, impaired UE functional use, improper body mechanics, postural dysfunction, and pain.   ACTIVITY LIMITATIONS: carrying, lifting, reach over head, and hygiene/grooming  PARTICIPATION LIMITATIONS: meal prep, cleaning, laundry, driving, shopping, and community activity  PERSONAL FACTORS: Past/current experiences are also affecting patient's functional outcome.   REHAB POTENTIAL: Good  CLINICAL DECISION MAKING: Stable/uncomplicated  EVALUATION COMPLEXITY: Low   GOALS: Goals reviewed with patient? Yes  SHORT TERM GOALS: Target date: 12/05/22  Pt. Independent with HEP to increase L shoulder AROM to WNL as compared to R shoulder with no increase c/o pain.   Baseline: see above Goal status: INITIAL   LONG TERM GOALS: Target date: 12/26/22  Pt. Will increase FOTO to 61 to improve  pain-free mobility.   Baseline: initial 45 Goal status: INITIAL  2.  Pt. Will report <4/10 L shoulder pain worst with overhead reaching/ lifting tasks.   Baseline: 8/10 L shoulder stabbing pain at worst.   Goal status: INITIAL  3.  Pt. Will increase L shoulder strength 1/2 muscle grade to improve lifting/ carrying tasks.  Baseline: See above Goal status: INITIAL  4.  Pt. Will complete 30 minutes of there.ex./ gym based ex with no increase c/o L shoulder/ R knee pain to prepare for future R TKA.    Baseline: currently not exercising  Goal status: INITIAL   PLAN:  PT FREQUENCY: 2x/week  PT DURATION: 6 weeks  PLANNED INTERVENTIONS: Therapeutic exercises, Therapeutic activity, Neuromuscular re-education, Patient/Family education, Self Care, Joint mobilization, Dry Needling, Electrical stimulation, Cryotherapy, Moist heat, Manual therapy, and Re-evaluation  PLAN FOR NEXT SESSION: Bowflex ex. Program/ Reassess L shoulder and STGs  Cammie Mcgee, PT, DPT # [id number] Everlene Other, SPT 11/29/2022, 1:04 PM

## 2022-12-01 ENCOUNTER — Ambulatory Visit: Payer: BC Managed Care – PPO | Admitting: Physical Therapy

## 2022-12-01 DIAGNOSIS — M6281 Muscle weakness (generalized): Secondary | ICD-10-CM

## 2022-12-01 DIAGNOSIS — G8929 Other chronic pain: Secondary | ICD-10-CM

## 2022-12-01 DIAGNOSIS — M25612 Stiffness of left shoulder, not elsewhere classified: Secondary | ICD-10-CM

## 2022-12-01 DIAGNOSIS — M25512 Pain in left shoulder: Secondary | ICD-10-CM | POA: Diagnosis not present

## 2022-12-01 NOTE — Therapy (Signed)
OUTPATIENT PHYSICAL THERAPY SHOULDER TREATMENT   Patient Name: Andrea Zhang MRN: [id number] DOB:October 31, 1961, 62 y.o., female Today's Date: 12/01/2022  END OF SESSION:  PT End of Session - 12/01/22 1300     Visit Number 5    Number of Visits 13    Date for PT Re-Evaluation 12/26/22    PT Start Time 1300    PT Stop Time 1351    PT Time Calculation (min) 51 min    Activity Tolerance Patient tolerated treatment well;Patient limited by pain    Behavior During Therapy [facility] for tasks assessed/performed             Past Medical History:  Diagnosis Date   Anemia    iron deficiency and b12 deficiency   Anxiety    Arthritis    Asthma    Diabetes mellitus without complication (HCC)    Fasciitis    left foot   GERD (gastroesophageal reflux disease)    History of kidney stones    Hypercholesterolemia    Hypertension    Migraines    MIGRAINES HAVE IMPROVED SINCE RECEIVING IRON   Past Surgical History:  Procedure Laterality Date   CHOLECYSTECTOMY  2004   COLONOSCOPY WITH ESOPHAGOGASTRODUODENOSCOPY (EGD)  02/2018   ESOPHAGOGASTRODUODENOSCOPY N/A 10/16/2020   Procedure: ESOPHAGOGASTRODUODENOSCOPY (EGD);  Surgeon: Andrea Bill, MD;  Location: [facility] ENDOSCOPY;  Service: Endoscopy;  Laterality: N/A;   ESOPHAGOGASTRODUODENOSCOPY (EGD) WITH PROPOFOL N/A 06/06/2016   Procedure: ESOPHAGOGASTRODUODENOSCOPY (EGD) WITH PROPOFOL;  Surgeon: Andrea Jun, MD;  Location: [facility] ENDOSCOPY;  Service: Endoscopy;  Laterality: N/A;   EXTRACORPOREAL SHOCK WAVE LITHOTRIPSY  2010   JOINT REPLACEMENT  2013   LT TKR   SAVORY DILATION N/A 06/06/2016   Procedure: SAVORY DILATION;  Surgeon: Andrea Jun, MD;  Location: [facility] ENDOSCOPY;  Service: Endoscopy;  Laterality: N/A;   SAVORY DILATION  02/2018   SHOULDER ARTHROSCOPY WITH OPEN ROTATOR CUFF REPAIR Right 05/24/2018   Procedure: right shoulder arthroscopy, extensive arthroscopic debridement, decompression, open rotator cuff repair, biceps  tenodesis;  Surgeon: Andrea Flake, MD;  Location: [facility] ORS;  Service: Orthopedics;  Laterality: Right;   Patient Active Problem List   Diagnosis Date Noted   Coronary artery calcification 09/09/2019   Aortic atherosclerosis (HCC) 09/09/2019   Allergic rhinitis 08/06/2019   Asthma 08/06/2019   Diabetes mellitus, type 2 (HCC) 08/06/2019   Dysphagia 08/06/2019   GERD (gastroesophageal reflux disease) 08/06/2019   Hypercalcemia 08/06/2019   Hypertension 08/06/2019   Migraines 08/06/2019   Osteoarthritis 08/06/2019   Folate deficiency 03/03/2019   Morbid obesity with BMI of 40.0-44.9, adult (HCC) 06/05/2018   Status post right rotator cuff repair 06/05/2018   Degenerative joint disease of right shoulder 05/24/2018   Degenerative tear of glenoid labrum, right 05/24/2018   Injury of tendon of long head of right biceps 05/24/2018   Nontraumatic complete tear of right rotator cuff 05/14/2018   Rotator cuff tendinitis, right 05/14/2018   Iron deficiency anemia 01/15/2018   B12 deficiency 09/07/2017   Microalbuminuric diabetic nephropathy (HCC) 09/07/2017   Primary osteoarthritis of right knee 02/19/2015   Restless leg syndrome 10/25/2012   Hyperlipidemia with target LDL less than 100 05/20/2012    PCP: Andrea Heading, MD  REFERRING PROVIDER: Dedra Skeens, PA-C  REFERRING DIAG: Primary osteoarthritis of right knee   Chronic bursitis of left shoulder  THERAPY DIAG:  Chronic left shoulder pain  Shoulder joint stiffness, left  Muscle weakness (generalized)  Rationale for Evaluation and Treatment: Rehabilitation  ONSET  DATE: 09/2022  SUBJECTIVE:                                                                                                                                                                                      SUBJECTIVE STATEMENT: Pt. Had PT in 2019 s/p R RTC repair.  Pt. Known to PT clinic and currently c/o L shoulder discomfort/ R knee pain.  Pt. Planning to  have R TKA in future but not ready at this time.  Pt. Had injection in R knee recently with benefit.  L TKA 10 years ago.  Pt. R hand dominant.  Pt. Sleeps on back and is able to sleep on L shoulder with no issues.  2/10 L posterior shoulder pain at rest and 8/10 stabbing pain at worst in L shoulder.    PERTINENT HISTORY: Andrea Zhang, [place] - 11/09/2022 8:30 AM EST Formatting of this note is different from the original. Images from the original note were not included. Chief Complaint Chief Complaint Patient presents with Left Shoulder - Pain Right Knee - Pain  Reason for Visit The patient is a 62 year old female that presented for reevaluation of right knee. She has documented right knee degenerative joint disease. She had a left total knee replacement done by Dr. Gavin Zhang about 10 years ago and has done very well. She has had cortisone injections in June 2023, and did well. The patient is requesting an injection in her right knee today. She is not interested in having knee surgery yet. She has to be careful with walking because of giving out symptoms. She has occasional sharp pain and popping in the right knee. She has swelling in the knee occasionally. The patient is a diabetic.  The patient is also here for reevaluation of her left shoulder. She is difficulty with getting dressed and overhead activities. She has pain on the side of her shoulder that is not going down the arm. She has no neck pain now. She has some biceps pain. She has no injury or trauma. She has continued doing exercises although has some discomfort. She denies any injury to the shoulder. She has no real weakness. She has no pain at nighttime or going behind her back. There is no numbness or tingling down the arm.  Medications Current Outpatient Medications Medication Sig Dispense Refill albuterol 90 mcg/actuation inhaler INHALE 2 INHALATIONS INTO THE LUNGS EVERY 4 HOURS AS NEEDED FOR WHEEZING 3 each 0 aspirin 81 MG EC  tablet Take 81 mg by mouth daily. atenoloL (TENORMIN) 50 MG tablet TAKE 1 TABLET(50 MG) BY MOUTH EVERY DAY 90 tablet 1 blood-glucose sensor (FREESTYLE LIBRE 3 SENSOR) Devi Apply to skin  every 14 days for glucose monitoring 6 each 1 cholecalciferol (VITAMIN D3) 2,000 unit tablet Take 2,000 Units by mouth once daily. cyclobenzaprine (FLEXERIL) 10 MG tablet Take 1 tablet (10 mg total) by mouth 3 (three) times daily for 180 days 270 tablet 1 eletriptan (RELPAX) 40 MG tablet May take a second dose after 2 hours if needed. Pharmacist: Give maximum amount of medicine allowed/month by patient's insurance. 9 tablet 2 EMGALITY PEN 120 mg/mL PnIj INJECT 120MG  INTO THE SKIN MONTHLY 1 mL 5 gabapentin (NEURONTIN) 800 MG tablet TAKE 1 TABLET(800 MG) BY MOUTH AT BEDTIME 90 tablet 1 hydroCHLOROthiazide (HYDRODIURIL) 12.5 MG tablet Take 1 tablet (12.5 mg total) by mouth once daily 90 tablet 1 meloxicam (MOBIC) 15 MG tablet Take 1 tablet (15 mg total) by mouth once daily for 30 days 30 tablet 3 mirtazapine (REMERON) 15 MG tablet Take 1 tablet (15 mg total) by mouth at bedtime 90 tablet 1 multivitamin tablet Take 1 tablet by mouth once daily naratriptan (AMERGE) 2.5 MG tablet TAKE 1 TABLET BY MOUTH ONCE AS NEEDED FOR MIGRAINE, ADMINISTER DOSE AT ONSET OF SYMPTOMS, IF NO RELIEF, MAY REPEAT IN 2 HOURS 10 tablet 3 omeprazole (PRILOSEC) 40 MG DR capsule TAKE 1 CAPSULE(40 MG) BY MOUTH TWICE DAILY BEFORE MEALS 180 capsule 1 PREVIDENT 5000 BOOSTER PLUS 1.1 % dental paste Place onto teeth at bedtime promethazine (PHENERGAN) 25 MG tablet TAKE 1 TABLET(25 MG) BY MOUTH EVERY 8 HOURS AS NEEDED FOR NAUSEA 15 tablet 1 semaglutide (OZEMPIC) 1 mg/dose (4 mg/3 mL) pen injector Inject 0.75 mLs (1 mg total) subcutaneously once a week 9 mL 1 simvastatin (ZOCOR) 10 MG tablet Take 1 tablet (10 mg total) by mouth at bedtime 90 tablet 1 SUMAtriptan (IMITREX) 100 MG tablet TAKE 1 TABLET BY MOUTH AS DIRECTED FOR MIGRAINE, MAY TAKE A SECOND  DOSE AFTER 2 HOURS, IF NEEDED 10 tablet 3   PAIN:  Are you having pain? Yes: NPRS scale: 2/10 Pain location: L shoulder Pain description: stabbing/ sharp Aggravating factors: overhead reaching Relieving factors: rest  PRECAUTIONS: None  WEIGHT BEARING RESTRICTIONS: No  FALLS:  Has patient fallen in last 6 months? No  LIVING ENVIRONMENT: Lives with: lives with their spouse Lives in: House/apartment Has following equipment at home: NoneNone  OCCUPATION: Desk job/ computer work.    PLOF: Independent  PATIENT GOALS:  Increase L shoulder pain-free mobility/ prepare for R TKA  NEXT MD VISIT:   OBJECTIVE:   DIAGNOSTIC FINDINGS:  No L shoulder MRI  PATIENT SURVEYS:  FOTO initial 45/ goal 69  COGNITION: Overall cognitive status: Within functional limits for tasks assessed     SENSATION: WFL  POSTURE: Rounded shoulder/ B UT muscle tightness noted.   UPPER EXTREMITY ROM:   Active ROM Right eval Left eval  Shoulder flexion WNL 167 deg.  Shoulder extension WNL   Shoulder abduction WNL 165 deg.  Shoulder adduction WNL   Shoulder internal rotation WNL 85 deg.  Shoulder external rotation WNL 88 deg.  Elbow flexion WNL WNL  Elbow extension WNL WNL  Wrist flexion WNL WNL  Wrist extension    Wrist ulnar deviation    Wrist radial deviation    Wrist pronation    Wrist supination    (Blank rows = not tested)  Cervical: flexion (40 deg.), extension (52 deg.), L lat. Flex. (35 deg.), R lat. Flex. (35 deg.), L rotn. (72 deg.), R rotn. (69 deg.)  UPPER EXTREMITY MMT:  MMT Right eval Left eval  Shoulder flexion 4/5 3/5  Shoulder extension    Shoulder abduction 4/5 3/5  Shoulder adduction 5/5 5/5  Shoulder internal rotation 4/5 4/5  Shoulder external rotation 4/5 3/5  Middle trapezius    Lower trapezius    Elbow flexion 5/5 4+/5  Elbow extension 5/5 5/5  Wrist flexion    Wrist extension    Wrist ulnar deviation    Wrist radial deviation    Wrist pronation     Wrist supination    Grip strength (lbs) 61# 57#  (Blank rows = not tested)  SHOULDER SPECIAL TESTS: Impingement tests: Neer impingement test: negative and Hawkins/Kennedy impingement test: negative Rotator cuff assessment: Empty can test: positive  and Jobe test (+) for L supraspinatus involvement  JOINT MOBILITY TESTING:  Good B shoulder capsular mobility.    PALPATION:  (+) L posterior deltoid/ supraspinatus tenderness   TODAY'S TREATMENT:                                                                                                                                         DATE: 12/01/22  Subjective: Pt. Came into PT clinic with increased motion and decreased pain in L shoulder from shoulder symptoms being exacerbated when doing household chores earlier in the week. Pt. Reports 0/10 pain at rest and 0/10 pain on the BIODEX. Pt. Reports feeling no discomfort after LE exercises in the previous treatment session. Pt. Reports beginning Bowflex HEP and had no concerns or increase in pain.   There.ex.:  BIODEX 6 min. (3 min frwd / 3 min bkwd)  Manual isometrics applied by PT for ABD./ER/IR with pt. In sitting. 10x2 each (3-5 sec. Hold)  B Pectoral stretches to open up subacromial space for impingement sx.'s 1x10 with (10 sec. Hold each side)  Nautilus: scapular retraction with bar 20# 2x10. No increase in pain.  B Bicep curls with handles 10# 2x10 reps Tricep ext. With handles 20# 2x8 reps  Further discussed Bowflex exercise program and educated on preventing overloading of rotator cuff muscles.   PATIENT EDUCATION: Education details: Access Code: [email or web address] Person educated: Patient Education method: Explanation, Demonstration, and Handouts Education comprehension: verbalized understanding and returned demonstration  HOME EXERCISE PROGRAM: Access Code: [email or web address] URL: [email or web address] Date: 11/14/2022 Prepared by: Dorene Grebe   Exercises - Standing  Isometric Shoulder Flexion with Doorway - Arm Bent  - 1 x daily - 7 x weekly - 1 sets - 10 reps - 10 seconds hold - Standing Isometric Shoulder External Rotation with Doorway  - 1 x daily - 7 x weekly - 1 sets - 10 reps - 10 seconds hold - Standing Isometric Shoulder Extension with Doorway - Arm Bent  - 1 x daily - 7 x weekly - 1 sets - 10 reps - 10 seconds hold - Seated Scapular Retraction  - 1 x daily - 7 x weekly - 1 sets - 10 reps - 10 seconds hold  ASSESSMENT:  CLINICAL IMPRESSION: Pt. Arrived to PT with improved pain and motion of L shoulder after exacerbating sx. Doing house chores earlier in the week. Pt. Presented without a sling and was able to move arm in all planes with full AROM with the exception of some limitations in flexion. Pt. Reported some pain in the ant./lat. shoulder with elevation above 90 deg. 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 Pt. Educated on anatomy of subacromial space and given pec stretches both in the clinic and added to her HEP to help increase the space where the RC tendons lie. Pt cued during strengthening exercises to keep elbow tucked, arm at 90 deg., and depress shoulders. Pt. Showed good understanding of her treatment and HEP program. Pt. Will benefit from continued skilled PT services to continue strengthening the scapular muscles to provide increased stability, strength, and decrease pain.   OBJECTIVE IMPAIRMENTS: decreased endurance, decreased mobility, decreased ROM, decreased strength, impaired UE functional use, improper body mechanics, postural dysfunction, and pain.   ACTIVITY LIMITATIONS: carrying, lifting, reach over head, and hygiene/grooming  PARTICIPATION LIMITATIONS: meal prep, cleaning, laundry, driving, shopping, and community activity  PERSONAL FACTORS: Past/current experiences are also affecting patient's functional outcome.   REHAB POTENTIAL: Good  CLINICAL DECISION MAKING: Stable/uncomplicated  EVALUATION  COMPLEXITY: Low   GOALS: Goals reviewed with patient? Yes  SHORT TERM GOALS: Target date: 12/05/22  Pt. Independent with HEP to increase L shoulder AROM to WNL as compared to R shoulder with no increase c/o pain.   Baseline: see above Goal status: INITIAL   LONG TERM GOALS: Target date: 12/26/22  Pt. Will increase FOTO to 61 to improve pain-free mobility.   Baseline: initial 45 Goal status: INITIAL  2.  Pt. Will report <4/10 L shoulder pain worst with overhead reaching/ lifting tasks.   Baseline: 8/10 L shoulder stabbing pain at worst.   Goal status: INITIAL  3.  Pt. Will increase L shoulder strength 1/2 muscle grade to improve lifting/ carrying tasks.  Baseline: See above Goal status: INITIAL  4.  Pt. Will complete 30 minutes of there.ex./ gym based ex with no increase c/o L shoulder/ R knee pain to prepare for future R TKA.    Baseline: currently not exercising  Goal status: INITIAL   PLAN:  PT FREQUENCY: 2x/week  PT DURATION: 6 weeks  PLANNED INTERVENTIONS: Therapeutic exercises, Therapeutic activity, Neuromuscular re-education, Patient/Family education, Self Care, Joint mobilization, Dry Needling, Electrical stimulation, Cryotherapy, Moist heat, Manual therapy, and Re-evaluation  PLAN FOR NEXT SESSION: Bowflex ex. Program/ Reassess L shoulder and STGs  Cammie Mcgee, PT, DPT # [id number] Everlene Other, SPT 12/01/2022, 5:30 PM

## 2022-12-05 ENCOUNTER — Ambulatory Visit: Payer: BC Managed Care – PPO | Admitting: Physical Therapy

## 2022-12-05 DIAGNOSIS — M25612 Stiffness of left shoulder, not elsewhere classified: Secondary | ICD-10-CM

## 2022-12-05 DIAGNOSIS — M6281 Muscle weakness (generalized): Secondary | ICD-10-CM

## 2022-12-05 DIAGNOSIS — M25512 Pain in left shoulder: Secondary | ICD-10-CM | POA: Diagnosis not present

## 2022-12-05 DIAGNOSIS — G8929 Other chronic pain: Secondary | ICD-10-CM

## 2022-12-05 NOTE — Therapy (Signed)
OUTPATIENT PHYSICAL THERAPY SHOULDER TREATMENT  Patient Name: Andrea Zhang MRN: [id number] DOB:[birth year], 62 y.o., female Today's Date: 12/05/2022  END OF SESSION:  PT End of Session - 12/05/22 1113     Visit Number 6    Number of Visits 13    Date for PT Re-Evaluation 12/26/22    PT Start Time 1113    Activity Tolerance Patient tolerated treatment well;Patient limited by pain    Behavior During Therapy [facility] for tasks assessed/performed             Past Medical History:  Diagnosis Date   Anemia    iron deficiency and b12 deficiency   Anxiety    Arthritis    Asthma    Diabetes mellitus without complication (HCC)    Fasciitis    left foot   GERD (gastroesophageal reflux disease)    History of kidney stones    Hypercholesterolemia    Hypertension    Migraines    MIGRAINES HAVE IMPROVED SINCE RECEIVING IRON   Past Surgical History:  Procedure Laterality Date   CHOLECYSTECTOMY  2004   COLONOSCOPY WITH ESOPHAGOGASTRODUODENOSCOPY (EGD)  02/2018   ESOPHAGOGASTRODUODENOSCOPY N/A 10/16/2020   Procedure: ESOPHAGOGASTRODUODENOSCOPY (EGD);  Surgeon: Regis Bill, MD;  Location: [name] ENDOSCOPY;  Service: Endoscopy;  Laterality: N/A;   ESOPHAGOGASTRODUODENOSCOPY (EGD) WITH PROPOFOL N/A 06/06/2016   Procedure: ESOPHAGOGASTRODUODENOSCOPY (EGD) WITH PROPOFOL;  Surgeon: Scot Jun, MD;  Location: [facility] ENDOSCOPY;  Service: Endoscopy;  Laterality: N/A;   EXTRACORPOREAL SHOCK WAVE LITHOTRIPSY  2010   JOINT REPLACEMENT  2013   LT TKR   SAVORY DILATION N/A 06/06/2016   Procedure: SAVORY DILATION;  Surgeon: Scot Jun, MD;  Location: [facility] ENDOSCOPY;  Service: Endoscopy;  Laterality: N/A;   SAVORY DILATION  02/2018   SHOULDER ARTHROSCOPY WITH OPEN ROTATOR CUFF REPAIR Right 05/24/2018   Procedure: right shoulder arthroscopy, extensive arthroscopic debridement, decompression, open rotator cuff repair, biceps tenodesis;  Surgeon: Christena Flake, MD;  Location: [facility] ORS;   Service: Orthopedics;  Laterality: Right;   Patient Active Problem List   Diagnosis Date Noted   Coronary artery calcification 09/09/2019   Aortic atherosclerosis (HCC) 09/09/2019   Allergic rhinitis 08/06/2019   Asthma 08/06/2019   Diabetes mellitus, type 2 (HCC) 08/06/2019   Dysphagia 08/06/2019   GERD (gastroesophageal reflux disease) 08/06/2019   Hypercalcemia 08/06/2019   Hypertension 08/06/2019   Migraines 08/06/2019   Osteoarthritis 08/06/2019   Folate deficiency 03/03/2019   Morbid obesity with BMI of 40.0-44.9, adult (HCC) 06/05/2018   Status post right rotator cuff repair 06/05/2018   Degenerative joint disease of right shoulder 05/24/2018   Degenerative tear of glenoid labrum, right 05/24/2018   Injury of tendon of long head of right biceps 05/24/2018   Nontraumatic complete tear of right rotator cuff 05/14/2018   Rotator cuff tendinitis, right 05/14/2018   Iron deficiency anemia 01/15/2018   B12 deficiency 09/07/2017   Microalbuminuric diabetic nephropathy (HCC) 09/07/2017   Primary osteoarthritis of right knee 02/19/2015   Restless leg syndrome 10/25/2012   Hyperlipidemia with target LDL less than 100 05/20/2012    PCP: Dalia Heading, MD  REFERRING PROVIDER: Dedra Skeens, PA-C  REFERRING DIAG: Primary osteoarthritis of right knee   Chronic bursitis of left shoulder  THERAPY DIAG:  Chronic left shoulder pain  Shoulder joint stiffness, left  Muscle weakness (generalized)  Stiffness of left shoulder joint  Chronic pain of right knee  Rationale for Evaluation and Treatment: Rehabilitation  ONSET DATE: 09/2022  SUBJECTIVE:  SUBJECTIVE STATEMENT: Pt. Had PT in 2019 s/p R RTC repair.  Pt. Known to PT clinic and currently c/o L shoulder discomfort/ R knee pain.  Pt. Planning to  have R TKA in future but not ready at this time.  Pt. Had injection in R knee recently with benefit.  L TKA 10 years ago.  Pt. R hand dominant.  Pt. Sleeps on back and is able to sleep on L shoulder with no issues.  2/10 L posterior shoulder pain at rest and 8/10 stabbing pain at worst in L shoulder.    PERTINENT HISTORY: [name], [place] - 11/09/2022 8:30 AM EST Formatting of this note is different from the original. Images from the original note were not included. Chief Complaint Chief Complaint Patient presents with Left Shoulder - Pain Right Knee - Pain  Reason for Visit The patient is a 62 year old female that presented for reevaluation of right knee. She has documented right knee degenerative joint disease. She had a left total knee replacement done by Dr. Gavin Potters about 10 years ago and has done very well. She has had cortisone injections in June 2023, and did well. The patient is requesting an injection in her right knee today. She is not interested in having knee surgery yet. She has to be careful with walking because of giving out symptoms. She has occasional sharp pain and popping in the right knee. She has swelling in the knee occasionally. The patient is a diabetic.  The patient is also here for reevaluation of her left shoulder. She is difficulty with getting dressed and overhead activities. She has pain on the side of her shoulder that is not going down the arm. She has no neck pain now. She has some biceps pain. She has no injury or trauma. She has continued doing exercises although has some discomfort. She denies any injury to the shoulder. She has no real weakness. She has no pain at nighttime or going behind her back. There is no numbness or tingling down the arm.  Medications Current Outpatient Medications Medication Sig Dispense Refill albuterol 90 mcg/actuation inhaler INHALE 2 INHALATIONS INTO THE LUNGS EVERY 4 HOURS AS NEEDED FOR WHEEZING 3 each 0 aspirin 81 MG EC  tablet Take 81 mg by mouth daily. atenoloL (TENORMIN) 50 MG tablet TAKE 1 TABLET(50 MG) BY MOUTH EVERY DAY 90 tablet 1 blood-glucose sensor (FREESTYLE LIBRE 3 SENSOR) Devi Apply to skin every 14 days for glucose monitoring 6 each 1 cholecalciferol (VITAMIN D3) 2,000 unit tablet Take 2,000 Units by mouth once daily. cyclobenzaprine (FLEXERIL) 10 MG tablet Take 1 tablet (10 mg total) by mouth 3 (three) times daily for 180 days 270 tablet 1 eletriptan (RELPAX) 40 MG tablet May take a second dose after 2 hours if needed. Pharmacist: Give maximum amount of medicine allowed/month by patient's insurance. 9 tablet 2 EMGALITY PEN 120 mg/mL PnIj INJECT 120MG  INTO THE SKIN MONTHLY 1 mL 5 gabapentin (NEURONTIN) 800 MG tablet TAKE 1 TABLET(800 MG) BY MOUTH AT BEDTIME 90 tablet 1 hydroCHLOROthiazide (HYDRODIURIL) 12.5 MG tablet Take 1 tablet (12.5 mg total) by mouth once daily 90 tablet 1 meloxicam (MOBIC) 15 MG tablet Take 1 tablet (15 mg total) by mouth once daily for 30 days 30 tablet 3 mirtazapine (REMERON) 15 MG tablet Take 1 tablet (15 mg total) by mouth at bedtime 90 tablet 1 multivitamin tablet Take 1 tablet by mouth once daily naratriptan (AMERGE) 2.5 MG tablet TAKE 1 TABLET BY MOUTH ONCE AS NEEDED FOR MIGRAINE, ADMINISTER  DOSE AT ONSET OF SYMPTOMS, IF NO RELIEF, MAY REPEAT IN 2 HOURS 10 tablet 3 omeprazole (PRILOSEC) 40 MG DR capsule TAKE 1 CAPSULE(40 MG) BY MOUTH TWICE DAILY BEFORE MEALS 180 capsule 1 PREVIDENT 5000 BOOSTER PLUS 1.1 % dental paste Place onto teeth at bedtime promethazine (PHENERGAN) 25 MG tablet TAKE 1 TABLET(25 MG) BY MOUTH EVERY 8 HOURS AS NEEDED FOR NAUSEA 15 tablet 1 semaglutide (OZEMPIC) 1 mg/dose (4 mg/3 mL) pen injector Inject 0.75 mLs (1 mg total) subcutaneously once a week 9 mL 1 simvastatin (ZOCOR) 10 MG tablet Take 1 tablet (10 mg total) by mouth at bedtime 90 tablet 1 SUMAtriptan (IMITREX) 100 MG tablet TAKE 1 TABLET BY MOUTH AS DIRECTED FOR MIGRAINE, MAY TAKE A SECOND  DOSE AFTER 2 HOURS, IF NEEDED 10 tablet 3   PAIN:  Are you having pain? Yes: NPRS scale: 2/10 Pain location: L shoulder Pain description: stabbing/ sharp Aggravating factors: overhead reaching Relieving factors: rest  PRECAUTIONS: None  WEIGHT BEARING RESTRICTIONS: No  FALLS:  Has patient fallen in last 6 months? No  LIVING ENVIRONMENT: Lives with: lives with their spouse Lives in: House/apartment Has following equipment at home: NoneNone  OCCUPATION: Desk job/ computer work.    PLOF: Independent  PATIENT GOALS:  Increase L shoulder pain-free mobility/ prepare for R TKA  NEXT MD VISIT:   OBJECTIVE:   DIAGNOSTIC FINDINGS:  No L shoulder MRI  PATIENT SURVEYS:  FOTO initial 45/ goal 33  COGNITION: Overall cognitive status: Within functional limits for tasks assessed     SENSATION: WFL  POSTURE: Rounded shoulder/ B UT muscle tightness noted.   UPPER EXTREMITY ROM:   Active ROM Right eval Left eval  Shoulder flexion WNL 167 deg.  Shoulder extension WNL   Shoulder abduction WNL 165 deg.  Shoulder adduction WNL   Shoulder internal rotation WNL 85 deg.  Shoulder external rotation WNL 88 deg.  Elbow flexion WNL WNL  Elbow extension WNL WNL  Wrist flexion WNL WNL  Wrist extension    Wrist ulnar deviation    Wrist radial deviation    Wrist pronation    Wrist supination    (Blank rows = not tested)  Cervical: flexion (40 deg.), extension (52 deg.), L lat. Flex. (35 deg.), R lat. Flex. (35 deg.), L rotn. (72 deg.), R rotn. (69 deg.)  UPPER EXTREMITY MMT:  MMT Right eval Left eval  Shoulder flexion 4/5 3/5  Shoulder extension    Shoulder abduction 4/5 3/5  Shoulder adduction 5/5 5/5  Shoulder internal rotation 4/5 4/5  Shoulder external rotation 4/5 3/5  Middle trapezius    Lower trapezius    Elbow flexion 5/5 4+/5  Elbow extension 5/5 5/5  Wrist flexion    Wrist extension    Wrist ulnar deviation    Wrist radial deviation    Wrist pronation     Wrist supination    Grip strength (lbs) 61# 57#  (Blank rows = not tested)  SHOULDER SPECIAL TESTS: Impingement tests: Neer impingement test: negative and Hawkins/Kennedy impingement test: negative Rotator cuff assessment: Empty can test: positive  and Jobe test (+) for L supraspinatus involvement  JOINT MOBILITY TESTING:  Good B shoulder capsular mobility.    PALPATION:  (+) L posterior deltoid/ supraspinatus tenderness   TODAY'S TREATMENT:  DATE: 12/05/22  Subjective:  Pt. Reports 4/10 R medial knee pain.  Pt. Wearing knee sleeve.  Pt. Reports 0/10 L shoulder discomfort at rest.  Pt. Had a migraine this weekend which limited activity but did well with shoulder.    There.ex.:  Nustep L4 10 min. B UE/LE.  Pt. Had less R knee issues with change in R knee positioning (midline).    Gait reassessment in gym (discussed R knee stability/ control).  Walking up curb/ grass after tx. Session (no issues).   L shoulder manual isometrics applied by PT for ABD./ER/IR/ shoulder flexion/ tricep/ bicep with pt. In supine 7x each (3-5 sec. Hold)  Supine R hamstring/ hip stretches (moderate crepitus).  Supine L/R SAQ with manual resistance (moderate)- 5x.    B Pectoral stretches to open up subacromial space for impingement sx.'s 1x10 with (10 sec. Hold each side)- good technique reported.    Nautilus: Seated lat. Pull downs (30#)/ scapular retraction (30#)/ sh. Adduction (20#) with handles 30# 15x2. No increase in pain.     Further discussed Bowflex exercise program and educated on preventing overloading of rotator cuff muscles.   PATIENT EDUCATION: Education details: Access Code: [email or web address] Person educated: Patient Education method: Explanation, Demonstration, and Handouts Education comprehension: verbalized understanding and returned demonstration  HOME  EXERCISE PROGRAM: Access Code: [email or web address] URL: [email or web address] Date: 11/14/2022 Prepared by: Dorene Grebe   Exercises - Standing Isometric Shoulder Flexion with Doorway - Arm Bent  - 1 x daily - 7 x weekly - 1 sets - 10 reps - 10 seconds hold - Standing Isometric Shoulder External Rotation with Doorway  - 1 x daily - 7 x weekly - 1 sets - 10 reps - 10 seconds hold - Standing Isometric Shoulder Extension with Doorway - Arm Bent  - 1 x daily - 7 x weekly - 1 sets - 10 reps - 10 seconds hold - Seated Scapular Retraction  - 1 x daily - 7 x weekly - 1 sets - 10 reps - 10 seconds hold  ASSESSMENT:  CLINICAL IMPRESSION: Pt. Arrived to PT with decrease L shoulder pain with increase shoulder flexion/ abduction.  Pt. Reported some pain in the L ant./lat. shoulder with elevation above 90 deg. Pt cued during strengthening exercises to keep elbow tucked, arm at 90 deg., and depress shoulders. Pt. Showed good understanding of pec stretches/ shoulder strengthening ex.  Moderate R knee crepitus during there.ex./ stretches.  Pt. Will benefit from continued skilled PT services to continue strengthening the scapular muscles to provide increased stability, strength, and decrease pain.   OBJECTIVE IMPAIRMENTS: decreased endurance, decreased mobility, decreased ROM, decreased strength, impaired UE functional use, improper body mechanics, postural dysfunction, and pain.   ACTIVITY LIMITATIONS: carrying, lifting, reach over head, and hygiene/grooming  PARTICIPATION LIMITATIONS: meal prep, cleaning, laundry, driving, shopping, and community activity  PERSONAL FACTORS: Past/current experiences are also affecting patient's functional outcome.   REHAB POTENTIAL: Good  CLINICAL DECISION MAKING: Stable/uncomplicated  EVALUATION COMPLEXITY: Low   GOALS: Goals reviewed with patient? Yes  SHORT TERM GOALS: Target date: 12/05/22  Pt. Independent with HEP to increase L shoulder AROM to WNL as  compared to R shoulder with no increase c/o pain.   Baseline: see above Goal status: Partially met   LONG TERM GOALS: Target date: 12/26/22  Pt. Will increase FOTO to 61 to improve pain-free mobility.   Baseline: initial 45 Goal status: INITIAL  2.  Pt. Will report <4/10 L shoulder pain worst with overhead reaching/ lifting tasks.  Baseline: 8/10 L shoulder stabbing pain at worst.   Goal status: INITIAL  3.  Pt. Will increase L shoulder strength 1/2 muscle grade to improve lifting/ carrying tasks.  Baseline: See above Goal status: INITIAL  4.  Pt. Will complete 30 minutes of there.ex./ gym based ex with no increase c/o L shoulder/ R knee pain to prepare for future R TKA.    Baseline: currently not exercising  Goal status: INITIAL   PLAN:  PT FREQUENCY: 2x/week  PT DURATION: 6 weeks  PLANNED INTERVENTIONS: Therapeutic exercises, Therapeutic activity, Neuromuscular re-education, Patient/Family education, Self Care, Joint mobilization, Dry Needling, Electrical stimulation, Cryotherapy, Moist heat, Manual therapy, and Re-evaluation  PLAN FOR NEXT SESSION: Bowflex ex. Program/ Discuss MD f/u  Cammie Mcgee, PT, DPT # [phone] Everlene Other, SPT 12/05/2022, 11:14 AM

## 2022-12-08 ENCOUNTER — Ambulatory Visit: Payer: BC Managed Care – PPO | Attending: Orthopedic Surgery | Admitting: Physical Therapy

## 2022-12-08 DIAGNOSIS — M238X1 Other internal derangements of right knee: Secondary | ICD-10-CM | POA: Diagnosis present

## 2022-12-08 DIAGNOSIS — M25612 Stiffness of left shoulder, not elsewhere classified: Secondary | ICD-10-CM | POA: Insufficient documentation

## 2022-12-08 DIAGNOSIS — R2689 Other abnormalities of gait and mobility: Secondary | ICD-10-CM | POA: Insufficient documentation

## 2022-12-08 DIAGNOSIS — M6281 Muscle weakness (generalized): Secondary | ICD-10-CM | POA: Diagnosis present

## 2022-12-08 DIAGNOSIS — G8929 Other chronic pain: Secondary | ICD-10-CM | POA: Insufficient documentation

## 2022-12-08 DIAGNOSIS — R29898 Other symptoms and signs involving the musculoskeletal system: Secondary | ICD-10-CM | POA: Insufficient documentation

## 2022-12-08 DIAGNOSIS — M25561 Pain in right knee: Secondary | ICD-10-CM | POA: Diagnosis present

## 2022-12-08 DIAGNOSIS — M25512 Pain in left shoulder: Secondary | ICD-10-CM | POA: Diagnosis present

## 2022-12-12 ENCOUNTER — Ambulatory Visit: Payer: BC Managed Care – PPO | Admitting: Physical Therapy

## 2022-12-12 DIAGNOSIS — M25512 Pain in left shoulder: Secondary | ICD-10-CM | POA: Diagnosis not present

## 2022-12-12 DIAGNOSIS — M25612 Stiffness of left shoulder, not elsewhere classified: Secondary | ICD-10-CM

## 2022-12-12 DIAGNOSIS — G8929 Other chronic pain: Secondary | ICD-10-CM

## 2022-12-12 DIAGNOSIS — M6281 Muscle weakness (generalized): Secondary | ICD-10-CM

## 2022-12-12 NOTE — Therapy (Signed)
OUTPATIENT PHYSICAL THERAPY SHOULDER TREATMENT  Patient Name: Andrea Zhang MRN: [id number] DOB:06/10/1961, 62 y.o., female Today's Date: 12/12/2022  END OF SESSION:  PT End of Session - 12/12/22 0748     Visit Number 7    Number of Visits 13    Date for PT Re-Evaluation 12/26/22    PT Start Time 1110    PT Stop Time 1201    PT Time Calculation (min) 51 min    Activity Tolerance Patient tolerated treatment well;Patient limited by pain    Behavior During Therapy [facility] for tasks assessed/performed             Past Medical History:  Diagnosis Date   Anemia    iron deficiency and b12 deficiency   Anxiety    Arthritis    Asthma    Diabetes mellitus without complication (HCC)    Fasciitis    left foot   GERD (gastroesophageal reflux disease)    History of kidney stones    Hypercholesterolemia    Hypertension    Migraines    MIGRAINES HAVE IMPROVED SINCE RECEIVING IRON   Past Surgical History:  Procedure Laterality Date   CHOLECYSTECTOMY  2004   COLONOSCOPY WITH ESOPHAGOGASTRODUODENOSCOPY (EGD)  02/2018   ESOPHAGOGASTRODUODENOSCOPY N/A 10/16/2020   Procedure: ESOPHAGOGASTRODUODENOSCOPY (EGD);  Surgeon: Regis Bill, MD;  Location: [facility] ENDOSCOPY;  Service: Endoscopy;  Laterality: N/A;   ESOPHAGOGASTRODUODENOSCOPY (EGD) WITH PROPOFOL N/A 06/06/2016   Procedure: ESOPHAGOGASTRODUODENOSCOPY (EGD) WITH PROPOFOL;  Surgeon: Scot Jun, MD;  Location: [facility] ENDOSCOPY;  Service: Endoscopy;  Laterality: N/A;   EXTRACORPOREAL SHOCK WAVE LITHOTRIPSY  2010   JOINT REPLACEMENT  2013   LT TKR   SAVORY DILATION N/A 06/06/2016   Procedure: SAVORY DILATION;  Surgeon: Scot Jun, MD;  Location: [facility] ENDOSCOPY;  Service: Endoscopy;  Laterality: N/A;   SAVORY DILATION  02/2018   SHOULDER ARTHROSCOPY WITH OPEN ROTATOR CUFF REPAIR Right 05/24/2018   Procedure: right shoulder arthroscopy, extensive arthroscopic debridement, decompression, open rotator cuff repair, biceps  tenodesis;  Surgeon: Christena Flake, MD;  Location: [facility] ORS;  Service: Orthopedics;  Laterality: Right;   Patient Active Problem List   Diagnosis Date Noted   Coronary artery calcification 09/09/2019   Aortic atherosclerosis (HCC) 09/09/2019   Allergic rhinitis 08/06/2019   Asthma 08/06/2019   Diabetes mellitus, type 2 (HCC) 08/06/2019   Dysphagia 08/06/2019   GERD (gastroesophageal reflux disease) 08/06/2019   Hypercalcemia 08/06/2019   Hypertension 08/06/2019   Migraines 08/06/2019   Osteoarthritis 08/06/2019   Folate deficiency 03/03/2019   Morbid obesity with BMI of 40.0-44.9, adult (HCC) 06/05/2018   Status post right rotator cuff repair 06/05/2018   Degenerative joint disease of right shoulder 05/24/2018   Degenerative tear of glenoid labrum, right 05/24/2018   Injury of tendon of long head of right biceps 05/24/2018   Nontraumatic complete tear of right rotator cuff 05/14/2018   Rotator cuff tendinitis, right 05/14/2018   Iron deficiency anemia 01/15/2018   B12 deficiency 09/07/2017   Microalbuminuric diabetic nephropathy (HCC) 09/07/2017   Primary osteoarthritis of right knee 02/19/2015   Restless leg syndrome 10/25/2012   Hyperlipidemia with target LDL less than 100 05/20/2012    PCP: Dalia Heading, MD  REFERRING PROVIDER: Dedra Skeens, PA-C  REFERRING DIAG: Primary osteoarthritis of right knee   Chronic bursitis of left shoulder  THERAPY DIAG:  Chronic left shoulder pain  Shoulder joint stiffness, left  Muscle weakness (generalized)  Stiffness of left shoulder joint  Chronic pain of  right knee  Rationale for Evaluation and Treatment: Rehabilitation  ONSET DATE: 09/2022  SUBJECTIVE:                                                                                                                                                                                      SUBJECTIVE STATEMENT: Pt. Had PT in 2019 s/p R RTC repair.  Pt. Known to PT clinic and  currently c/o L shoulder discomfort/ R knee pain.  Pt. Planning to have R TKA in future but not ready at this time.  Pt. Had injection in R knee recently with benefit.  L TKA 10 years ago.  Pt. R hand dominant.  Pt. Sleeps on back and is able to sleep on L shoulder with no issues.  2/10 L posterior shoulder pain at rest and 8/10 stabbing pain at worst in L shoulder.    PERTINENT HISTORY: [name], [place] - 11/09/2022 8:30 AM EST Formatting of this note is different from the original. Images from the original note were not included. Chief Complaint Chief Complaint Patient presents with Left Shoulder - Pain Right Knee - Pain  Reason for Visit The patient is a 62 year old female that presented for reevaluation of right knee. She has documented right knee degenerative joint disease. She had a left total knee replacement done by Dr. Gavin Potters about 10 years ago and has done very well. She has had cortisone injections in June 2023, and did well. The patient is requesting an injection in her right knee today. She is not interested in having knee surgery yet. She has to be careful with walking because of giving out symptoms. She has occasional sharp pain and popping in the right knee. She has swelling in the knee occasionally. The patient is a diabetic.  The patient is also here for reevaluation of her left shoulder. She is difficulty with getting dressed and overhead activities. She has pain on the side of her shoulder that is not going down the arm. She has no neck pain now. She has some biceps pain. She has no injury or trauma. She has continued doing exercises although has some discomfort. She denies any injury to the shoulder. She has no real weakness. She has no pain at nighttime or going behind her back. There is no numbness or tingling down the arm.  Medications Current Outpatient Medications Medication Sig Dispense Refill albuterol 90 mcg/actuation inhaler INHALE 2 INHALATIONS INTO THE  LUNGS EVERY 4 HOURS AS NEEDED FOR WHEEZING 3 each 0 aspirin 81 MG EC tablet Take 81 mg by mouth daily. atenoloL (TENORMIN) 50 MG tablet TAKE 1 TABLET(50 MG) BY MOUTH EVERY DAY 90 tablet  1 blood-glucose sensor (FREESTYLE LIBRE 3 SENSOR) Devi Apply to skin every 14 days for glucose monitoring 6 each 1 cholecalciferol (VITAMIN D3) 2,000 unit tablet Take 2,000 Units by mouth once daily. cyclobenzaprine (FLEXERIL) 10 MG tablet Take 1 tablet (10 mg total) by mouth 3 (three) times daily for 180 days 270 tablet 1 eletriptan (RELPAX) 40 MG tablet May take a second dose after 2 hours if needed. Pharmacist: Give maximum amount of medicine allowed/month by patient's insurance. 9 tablet 2 EMGALITY PEN 120 mg/mL PnIj INJECT 120MG  INTO THE SKIN MONTHLY 1 mL 5 gabapentin (NEURONTIN) 800 MG tablet TAKE 1 TABLET(800 MG) BY MOUTH AT BEDTIME 90 tablet 1 hydroCHLOROthiazide (HYDRODIURIL) 12.5 MG tablet Take 1 tablet (12.5 mg total) by mouth once daily 90 tablet 1 meloxicam (MOBIC) 15 MG tablet Take 1 tablet (15 mg total) by mouth once daily for 30 days 30 tablet 3 mirtazapine (REMERON) 15 MG tablet Take 1 tablet (15 mg total) by mouth at bedtime 90 tablet 1 multivitamin tablet Take 1 tablet by mouth once daily naratriptan (AMERGE) 2.5 MG tablet TAKE 1 TABLET BY MOUTH ONCE AS NEEDED FOR MIGRAINE, ADMINISTER DOSE AT ONSET OF SYMPTOMS, IF NO RELIEF, MAY REPEAT IN 2 HOURS 10 tablet 3 omeprazole (PRILOSEC) 40 MG DR capsule TAKE 1 CAPSULE(40 MG) BY MOUTH TWICE DAILY BEFORE MEALS 180 capsule 1 PREVIDENT 5000 BOOSTER PLUS 1.1 % dental paste Place onto teeth at bedtime promethazine (PHENERGAN) 25 MG tablet TAKE 1 TABLET(25 MG) BY MOUTH EVERY 8 HOURS AS NEEDED FOR NAUSEA 15 tablet 1 semaglutide (OZEMPIC) 1 mg/dose (4 mg/3 mL) pen injector Inject 0.75 mLs (1 mg total) subcutaneously once a week 9 mL 1 simvastatin (ZOCOR) 10 MG tablet Take 1 tablet (10 mg total) by mouth at bedtime 90 tablet 1 SUMAtriptan (IMITREX) 100 MG tablet  TAKE 1 TABLET BY MOUTH AS DIRECTED FOR MIGRAINE, MAY TAKE A SECOND DOSE AFTER 2 HOURS, IF NEEDED 10 tablet 3   PAIN:  Are you having pain? Yes: NPRS scale: 2/10 Pain location: L shoulder Pain description: stabbing/ sharp Aggravating factors: overhead reaching Relieving factors: rest  PRECAUTIONS: None  WEIGHT BEARING RESTRICTIONS: No  FALLS:  Has patient fallen in last 6 months? No  LIVING ENVIRONMENT: Lives with: lives with their spouse Lives in: House/apartment Has following equipment at home: NoneNone  OCCUPATION: Desk job/ computer work.    PLOF: Independent  PATIENT GOALS:  Increase L shoulder pain-free mobility/ prepare for R TKA  NEXT MD VISIT:   OBJECTIVE:   DIAGNOSTIC FINDINGS:  No L shoulder MRI  PATIENT SURVEYS:  FOTO initial 45/ goal 71  COGNITION: Overall cognitive status: Within functional limits for tasks assessed     SENSATION: WFL  POSTURE: Rounded shoulder/ B UT muscle tightness noted.   UPPER EXTREMITY ROM:   Active ROM Right eval Left eval  Shoulder flexion WNL 167 deg.  Shoulder extension WNL   Shoulder abduction WNL 165 deg.  Shoulder adduction WNL   Shoulder internal rotation WNL 85 deg.  Shoulder external rotation WNL 88 deg.  Elbow flexion WNL WNL  Elbow extension WNL WNL  Wrist flexion WNL WNL  Wrist extension    Wrist ulnar deviation    Wrist radial deviation    Wrist pronation    Wrist supination    (Blank rows = not tested)  Cervical: flexion (40 deg.), extension (52 deg.), L lat. Flex. (35 deg.), R lat. Flex. (35 deg.), L rotn. (72 deg.), R rotn. (69 deg.)  UPPER EXTREMITY MMT:  MMT Right eval Left eval  Shoulder flexion 4/5 3/5  Shoulder extension    Shoulder abduction 4/5 3/5  Shoulder adduction 5/5 5/5  Shoulder internal rotation 4/5 4/5  Shoulder external rotation 4/5 3/5  Middle trapezius    Lower trapezius    Elbow flexion 5/5 4+/5  Elbow extension 5/5 5/5  Wrist flexion    Wrist extension     Wrist ulnar deviation    Wrist radial deviation    Wrist pronation    Wrist supination    Grip strength (lbs) 61# 57#  (Blank rows = not tested)  SHOULDER SPECIAL TESTS: Impingement tests: Neer impingement test: negative and Hawkins/Kennedy impingement test: negative Rotator cuff assessment: Empty can test: positive  and Jobe test (+) for L supraspinatus involvement  JOINT MOBILITY TESTING:  Good B shoulder capsular mobility.    PALPATION:  (+) L posterior deltoid/ supraspinatus tenderness   TODAY'S TREATMENT:                                                                                                                                         DATE: 12/12/22  Subjective:  Pt. Reports 1/10 L shoulder pain prior to tx. Session. Pt. Has minimal difficulty with forward/ overhead reaching.  Pt. Reports improvement with L shoulder IR movement and latching seatbelt.  Pt. C/o 3/10 R knee pain and noted crepitus in knee during movement.  Pt. Wears knee brace with increase walking to manage pain.  Pt. Has a brain MRI being scheduled for migraines in near future.      There.ex.:  Nustep L4 10 min. B UE/LE.  Pt. Had less R knee issues with change in R knee positioning (midline).    L shoulder manual isometrics applied by PT for ABD./ER/IR/ shoulder flexion/ tricep/ bicep with pt. In supine 5x each (3-5 sec. Hold)  Supine R hamstring/ hip stretches (moderate crepitus).  Supine L/R quad sets/ SLR/ SAQ/ clamshells with manual resistance (moderate)- 5x.    B Pectoral stretches to open up subacromial space for impingement sx.'s 1x10 with (10 sec. Hold each side)- good technique reported.    Nautilus: Seated lat. Pull downs (30#)/ scapular retraction (30#)/ sh. Adduction (20#) with handles 30# 15x2. No increase in pain.    Further discussed Bowflex exercise program and educated on preventing overloading of rotator cuff muscles.   Manual tx.:  Seated L shoulder AROM: flexion 171 deg./ abduction  165 deg.    Seated STM to upper back/ UT/ posterior deltoid musculature.  Supine R distal quad/ hamstring STM and reassessment of R knee joint mobility.    PATIENT EDUCATION: Education details: Access Code: [email or web address] Person educated: Patient Education method: Explanation, Demonstration, and Handouts Education comprehension: verbalized understanding and returned demonstration  HOME EXERCISE PROGRAM: Access Code: [email or web address] URL: [email or web address] Date: 11/14/2022 Prepared by: Dorene Grebe   Exercises - Standing Isometric Shoulder Flexion with Doorway - Arm Bent  -  1 x daily - 7 x weekly - 1 sets - 10 reps - 10 seconds hold - Standing Isometric Shoulder External Rotation with Doorway  - 1 x daily - 7 x weekly - 1 sets - 10 reps - 10 seconds hold - Standing Isometric Shoulder Extension with Doorway - Arm Bent  - 1 x daily - 7 x weekly - 1 sets - 10 reps - 10 seconds hold - Seated Scapular Retraction  - 1 x daily - 7 x weekly - 1 sets - 10 reps - 10 seconds hold  ASSESSMENT:  CLINICAL IMPRESSION: Pt. Arrived to PT with decrease L shoulder pain with increase shoulder flexion/ abduction.  Pt. Reported moderate R knee discomfort with resisted there.ex./ walking.   Pt. Showed good understanding of pec stretches/ shoulder strengthening ex.  Moderate R knee crepitus during there.ex./ stretches.  Pt. Will benefit from continued skilled PT services to continue strengthening the scapular muscles to provide increased stability, strength, and decrease pain.   OBJECTIVE IMPAIRMENTS: decreased endurance, decreased mobility, decreased ROM, decreased strength, impaired UE functional use, improper body mechanics, postural dysfunction, and pain.   ACTIVITY LIMITATIONS: carrying, lifting, reach over head, and hygiene/grooming  PARTICIPATION LIMITATIONS: meal prep, cleaning, laundry, driving, shopping, and community activity  PERSONAL FACTORS: Past/current experiences are also affecting  patient's functional outcome.   REHAB POTENTIAL: Good  CLINICAL DECISION MAKING: Stable/uncomplicated  EVALUATION COMPLEXITY: Low   GOALS: Goals reviewed with patient? Yes  SHORT TERM GOALS: Target date: 12/05/22  Pt. Independent with HEP to increase L shoulder AROM to WNL as compared to R shoulder with no increase c/o pain.   Baseline: see above Goal status: Goal met   LONG TERM GOALS: Target date: 12/26/22  Pt. Will increase FOTO to 61 to improve pain-free mobility.   Baseline: initial 45 Goal status: INITIAL  2.  Pt. Will report <4/10 L shoulder pain worst with overhead reaching/ lifting tasks.   Baseline: 8/10 L shoulder stabbing pain at worst.   Goal status: INITIAL  3.  Pt. Will increase L shoulder strength 1/2 muscle grade to improve lifting/ carrying tasks.  Baseline: See above Goal status: INITIAL  4.  Pt. Will complete 30 minutes of there.ex./ gym based ex with no increase c/o L shoulder/ R knee pain to prepare for future R TKA.    Baseline: currently not exercising  Goal status: INITIAL   PLAN:  PT FREQUENCY: 2x/week  PT DURATION: 6 weeks  PLANNED INTERVENTIONS: Therapeutic exercises, Therapeutic activity, Neuromuscular re-education, Patient/Family education, Self Care, Joint mobilization, Dry Needling, Electrical stimulation, Cryotherapy, Moist heat, Manual therapy, and Re-evaluation  PLAN FOR NEXT SESSION: Bowflex ex. Program/ Discuss MD f/u  Cammie Mcgee, PT, DPT # [phone] Everlene Other, SPT 12/12/2022, 10:04 AM

## 2022-12-12 NOTE — Therapy (Signed)
OUTPATIENT PHYSICAL THERAPY SHOULDER TREATMENT  Patient Name: Andrea Zhang MRN: [id number] DOB:Aug 17, 1961, 62 y.o., female Today's Date: 12/12/2022  END OF SESSION:  PT End of Session - 12/12/22 1118     Visit Number 8    Number of Visits 13    Date for PT Re-Evaluation 12/26/22    PT Start Time 1117    PT Stop Time 1200    PT Time Calculation (min) 43 min    Activity Tolerance Patient tolerated treatment well;No increased pain    Behavior During Therapy WFL for tasks assessed/performed             Past Medical History:  Diagnosis Date   Anemia    iron deficiency and b12 deficiency   Anxiety    Arthritis    Asthma    Diabetes mellitus without complication (HCC)    Fasciitis    left foot   GERD (gastroesophageal reflux disease)    History of kidney stones    Hypercholesterolemia    Hypertension    Migraines    MIGRAINES HAVE IMPROVED SINCE RECEIVING IRON   Past Surgical History:  Procedure Laterality Date   CHOLECYSTECTOMY  2004   COLONOSCOPY WITH ESOPHAGOGASTRODUODENOSCOPY (EGD)  02/2018   ESOPHAGOGASTRODUODENOSCOPY N/A 10/16/2020   Procedure: ESOPHAGOGASTRODUODENOSCOPY (EGD);  Surgeon: Regis Bill, MD;  Location: [facility] ENDOSCOPY;  Service: Endoscopy;  Laterality: N/A;   ESOPHAGOGASTRODUODENOSCOPY (EGD) WITH PROPOFOL N/A 06/06/2016   Procedure: ESOPHAGOGASTRODUODENOSCOPY (EGD) WITH PROPOFOL;  Surgeon: Scot Jun, MD;  Location: [facility] ENDOSCOPY;  Service: Endoscopy;  Laterality: N/A;   EXTRACORPOREAL SHOCK WAVE LITHOTRIPSY  2010   JOINT REPLACEMENT  2013   LT TKR   SAVORY DILATION N/A 06/06/2016   Procedure: SAVORY DILATION;  Surgeon: Scot Jun, MD;  Location: [facility] ENDOSCOPY;  Service: Endoscopy;  Laterality: N/A;   SAVORY DILATION  02/2018   SHOULDER ARTHROSCOPY WITH OPEN ROTATOR CUFF REPAIR Right 05/24/2018   Procedure: right shoulder arthroscopy, extensive arthroscopic debridement, decompression, open rotator cuff repair, biceps tenodesis;   Surgeon: Christena Flake, MD;  Location: [facility] ORS;  Service: Orthopedics;  Laterality: Right;   Patient Active Problem List   Diagnosis Date Noted   Coronary artery calcification 09/09/2019   Aortic atherosclerosis (HCC) 09/09/2019   Allergic rhinitis 08/06/2019   Asthma 08/06/2019   Diabetes mellitus, type 2 (HCC) 08/06/2019   Dysphagia 08/06/2019   GERD (gastroesophageal reflux disease) 08/06/2019   Hypercalcemia 08/06/2019   Hypertension 08/06/2019   Migraines 08/06/2019   Osteoarthritis 08/06/2019   Folate deficiency 03/03/2019   Morbid obesity with BMI of 40.0-44.9, adult (HCC) 06/05/2018   Status post right rotator cuff repair 06/05/2018   Degenerative joint disease of right shoulder 05/24/2018   Degenerative tear of glenoid labrum, right 05/24/2018   Injury of tendon of long head of right biceps 05/24/2018   Nontraumatic complete tear of right rotator cuff 05/14/2018   Rotator cuff tendinitis, right 05/14/2018   Iron deficiency anemia 01/15/2018   B12 deficiency 09/07/2017   Microalbuminuric diabetic nephropathy (HCC) 09/07/2017   Primary osteoarthritis of right knee 02/19/2015   Restless leg syndrome 10/25/2012   Hyperlipidemia with target LDL less than 100 05/20/2012    PCP: Dalia Heading, MD  REFERRING PROVIDER: Dedra Skeens, PA-C  REFERRING DIAG: Primary osteoarthritis of right knee   Chronic bursitis of left shoulder  THERAPY DIAG:  Chronic left shoulder pain  Shoulder joint stiffness, left  Muscle weakness (generalized)  Chronic pain of right knee  Rationale for Evaluation and  Treatment: Rehabilitation  ONSET DATE: 09/2022  SUBJECTIVE:                                                                                                                                                                                      SUBJECTIVE STATEMENT: Pt. Had PT in 2019 s/p R RTC repair.  Pt. Known to PT clinic and currently c/o L shoulder discomfort/ R knee pain.   Pt. Planning to have R TKA in future but not ready at this time.  Pt. Had injection in R knee recently with benefit.  L TKA 10 years ago.  Pt. R hand dominant.  Pt. Sleeps on back and is able to sleep on L shoulder with no issues.  2/10 L posterior shoulder pain at rest and 8/10 stabbing pain at worst in L shoulder.    PERTINENT HISTORY: [name], [place] - 11/09/2022 8:30 AM EST Formatting of this note is different from the original. Images from the original note were not included. Chief Complaint Chief Complaint Patient presents with Left Shoulder - Pain Right Knee - Pain  Reason for Visit The patient is a 62 year old female that presented for reevaluation of right knee. She has documented right knee degenerative joint disease. She had a left total knee replacement done by Dr. Gavin Potters about 10 years ago and has done very well. She has had cortisone injections in June 2023, and did well. The patient is requesting an injection in her right knee today. She is not interested in having knee surgery yet. She has to be careful with walking because of giving out symptoms. She has occasional sharp pain and popping in the right knee. She has swelling in the knee occasionally. The patient is a diabetic.  The patient is also here for reevaluation of her left shoulder. She is difficulty with getting dressed and overhead activities. She has pain on the side of her shoulder that is not going down the arm. She has no neck pain now. She has some biceps pain. She has no injury or trauma. She has continued doing exercises although has some discomfort. She denies any injury to the shoulder. She has no real weakness. She has no pain at nighttime or going behind her back. There is no numbness or tingling down the arm.  Medications Current Outpatient Medications Medication Sig Dispense Refill albuterol 90 mcg/actuation inhaler INHALE 2 INHALATIONS INTO THE LUNGS EVERY 4 HOURS AS NEEDED FOR WHEEZING 3 each  0 aspirin 81 MG EC tablet Take 81 mg by mouth daily. atenoloL (TENORMIN) 50 MG tablet TAKE 1 TABLET(50 MG) BY MOUTH EVERY DAY 90 tablet 1 blood-glucose sensor (FREESTYLE LIBRE 3 SENSOR)  Devi Apply to skin every 14 days for glucose monitoring 6 each 1 cholecalciferol (VITAMIN D3) 2,000 unit tablet Take 2,000 Units by mouth once daily. cyclobenzaprine (FLEXERIL) 10 MG tablet Take 1 tablet (10 mg total) by mouth 3 (three) times daily for 180 days 270 tablet 1 eletriptan (RELPAX) 40 MG tablet May take a second dose after 2 hours if needed. Pharmacist: Give maximum amount of medicine allowed/month by patient's insurance. 9 tablet 2 EMGALITY PEN 120 mg/mL PnIj INJECT 120MG  INTO THE SKIN MONTHLY 1 mL 5 gabapentin (NEURONTIN) 800 MG tablet TAKE 1 TABLET(800 MG) BY MOUTH AT BEDTIME 90 tablet 1 hydroCHLOROthiazide (HYDRODIURIL) 12.5 MG tablet Take 1 tablet (12.5 mg total) by mouth once daily 90 tablet 1 meloxicam (MOBIC) 15 MG tablet Take 1 tablet (15 mg total) by mouth once daily for 30 days 30 tablet 3 mirtazapine (REMERON) 15 MG tablet Take 1 tablet (15 mg total) by mouth at bedtime 90 tablet 1 multivitamin tablet Take 1 tablet by mouth once daily naratriptan (AMERGE) 2.5 MG tablet TAKE 1 TABLET BY MOUTH ONCE AS NEEDED FOR MIGRAINE, ADMINISTER DOSE AT ONSET OF SYMPTOMS, IF NO RELIEF, MAY REPEAT IN 2 HOURS 10 tablet 3 omeprazole (PRILOSEC) 40 MG DR capsule TAKE 1 CAPSULE(40 MG) BY MOUTH TWICE DAILY BEFORE MEALS 180 capsule 1 PREVIDENT 5000 BOOSTER PLUS 1.1 % dental paste Place onto teeth at bedtime promethazine (PHENERGAN) 25 MG tablet TAKE 1 TABLET(25 MG) BY MOUTH EVERY 8 HOURS AS NEEDED FOR NAUSEA 15 tablet 1 semaglutide (OZEMPIC) 1 mg/dose (4 mg/3 mL) pen injector Inject 0.75 mLs (1 mg total) subcutaneously once a week 9 mL 1 simvastatin (ZOCOR) 10 MG tablet Take 1 tablet (10 mg total) by mouth at bedtime 90 tablet 1 SUMAtriptan (IMITREX) 100 MG tablet TAKE 1 TABLET BY MOUTH AS DIRECTED FOR MIGRAINE,  MAY TAKE A SECOND DOSE AFTER 2 HOURS, IF NEEDED 10 tablet 3   PAIN:  Are you having pain? Yes: NPRS scale: 2/10 Pain location: L shoulder Pain description: stabbing/ sharp Aggravating factors: overhead reaching Relieving factors: rest  PRECAUTIONS: None  WEIGHT BEARING RESTRICTIONS: No  FALLS:  Has patient fallen in last 6 months? No  LIVING ENVIRONMENT: Lives with: lives with their spouse Lives in: House/apartment Has following equipment at home: NoneNone  OCCUPATION: Desk job/ computer work.    PLOF: Independent  PATIENT GOALS:  Increase L shoulder pain-free mobility/ prepare for R TKA  NEXT MD VISIT:   OBJECTIVE:   DIAGNOSTIC FINDINGS:  No L shoulder MRI  PATIENT SURVEYS:  FOTO initial 45/ goal 3  COGNITION: Overall cognitive status: Within functional limits for tasks assessed     SENSATION: WFL  POSTURE: Rounded shoulder/ B UT muscle tightness noted.   UPPER EXTREMITY ROM:   Active ROM Right eval Left eval  Shoulder flexion WNL 167 deg.  Shoulder extension WNL   Shoulder abduction WNL 165 deg.  Shoulder adduction WNL   Shoulder internal rotation WNL 85 deg.  Shoulder external rotation WNL 88 deg.  Elbow flexion WNL WNL  Elbow extension WNL WNL  Wrist flexion WNL WNL  Wrist extension    Wrist ulnar deviation    Wrist radial deviation    Wrist pronation    Wrist supination    (Blank rows = not tested)  Cervical: flexion (40 deg.), extension (52 deg.), L lat. Flex. (35 deg.), R lat. Flex. (35 deg.), L rotn. (72 deg.), R rotn. (69 deg.)  UPPER EXTREMITY MMT:  MMT Right eval Left eval  Shoulder  flexion 4/5 3/5  Shoulder extension    Shoulder abduction 4/5 3/5  Shoulder adduction 5/5 5/5  Shoulder internal rotation 4/5 4/5  Shoulder external rotation 4/5 3/5  Middle trapezius    Lower trapezius    Elbow flexion 5/5 4+/5  Elbow extension 5/5 5/5  Wrist flexion    Wrist extension    Wrist ulnar deviation    Wrist radial deviation     Wrist pronation    Wrist supination    Grip strength (lbs) 61# 57#  (Blank rows = not tested)  SHOULDER SPECIAL TESTS: Impingement tests: Neer impingement test: negative and Hawkins/Kennedy impingement test: negative Rotator cuff assessment: Empty can test: positive  and Jobe test (+) for L supraspinatus involvement  JOINT MOBILITY TESTING:  Good B shoulder capsular mobility.    PALPATION:  (+) L posterior deltoid/ supraspinatus tenderness   TODAY'S TREATMENT:                                                                                                                                         DATE: 12/12/22  Subjective:  Pt. Reports 3/10 L shoulder pain prior to tx. Session. Pt. Reports some L shoulder pain with overhead reaching activities. Pt. Reports being able to do bow-flex exercises at home to continue strengthening her shoulder. Pt. Reports 3/10 R knee pain on NPS. Pt. Reports needed additional practice with stair training. Pt. Reports no change in pain in L shoulder or R knee with NuStep warm-up.   There.ex.:  Nustep L4 10 min. B UE/LE.  Pt. Had less R knee issues with change in R knee positioning (midline).    Lateral hip drop from 6" step with UE support 2x10 each side. Pt. Cued to tap down while keeping core contracted and pelvis level to avoid compensation of the back.   TRX squats 2x15 reps. Pt. Cued to keep knees behind toes to avoid excess force on patella.   B TKE's with black band. 2x10 each side  B Hamstring curls in //-bars for UE support. 4lb. Ankle wt.'s applied. 2x10 reps each side.   Further discussed Bowflex exercise program and educated on preventing overloading of rotator cuff/knee joint.    PATIENT EDUCATION: Education details: Access Code: [email or web address] Person educated: Patient Education method: Explanation, Demonstration, and Handouts Education comprehension: verbalized understanding and returned demonstration  HOME EXERCISE PROGRAM: Access Code:  [email or web address] URL: [email or web address] Date: 11/14/2022 Prepared by: Dorene Grebe   Exercises - Standing Isometric Shoulder Flexion with Doorway - Arm Bent  - 1 x daily - 7 x weekly - 1 sets - 10 reps - 10 seconds hold - Standing Isometric Shoulder External Rotation with Doorway  - 1 x daily - 7 x weekly - 1 sets - 10 reps - 10 seconds hold - Standing Isometric Shoulder Extension with Doorway - Arm Bent  - 1 x daily - 7 x weekly - 1 sets -  10 reps - 10 seconds hold - Seated Scapular Retraction  - 1 x daily - 7 x weekly - 1 sets - 10 reps - 10 seconds hold  ASSESSMENT:  CLINICAL IMPRESSION: Pt. Arrived to PT with 3/10 pain on NPS in both the R knee and L shoulder. Pt. Has been able to perform an at home gym based exercise program to continue strengthening of her RC/shoulder. PT's main treatment focus has shifted toward pt.'s R knee and strengthening the surrounding muscles in order to prepare for a R TKA. Pt. Cued during lat. Hip drops to tap down while keeping core contracted and pelvis level to avoid compensation of the back. Pt. Requires few rest breaks and shows moderate endurance. Pt. Had no increase in R knee pain during today's treatment. Pt. Shows moderate weakness/decreased endurance with activities including R LE and would benefit from continued strengthening and functional dynamic movement training through skilled physical therapy treatment.   OBJECTIVE IMPAIRMENTS: decreased endurance, decreased mobility, decreased ROM, decreased strength, impaired UE functional use, improper body mechanics, postural dysfunction, and pain.   ACTIVITY LIMITATIONS: carrying, lifting, reach over head, and hygiene/grooming  PARTICIPATION LIMITATIONS: meal prep, cleaning, laundry, driving, shopping, and community activity  PERSONAL FACTORS: Past/current experiences are also affecting patient's functional outcome.   REHAB POTENTIAL: Good  CLINICAL DECISION MAKING:  Stable/uncomplicated  EVALUATION COMPLEXITY: Low   GOALS: Goals reviewed with patient? Yes  SHORT TERM GOALS: Target date: 12/05/22  Pt. Independent with HEP to increase L shoulder AROM to WNL as compared to R shoulder with no increase c/o pain.   Baseline: see above Goal status: Goal met   LONG TERM GOALS: Target date: 12/26/22  Pt. Will increase FOTO to 61 to improve pain-free mobility.   Baseline: initial 45 Goal status: INITIAL  2.  Pt. Will report <4/10 L shoulder pain worst with overhead reaching/ lifting tasks.   Baseline: 8/10 L shoulder stabbing pain at worst.   Goal status: INITIAL  3.  Pt. Will increase L shoulder strength 1/2 muscle grade to improve lifting/ carrying tasks.  Baseline: See above Goal status: INITIAL  4.  Pt. Will complete 30 minutes of there.ex./ gym based ex with no increase c/o L shoulder/ R knee pain to prepare for future R TKA.    Baseline: currently not exercising  Goal status: INITIAL   PLAN:  PT FREQUENCY: 2x/week  PT DURATION: 6 weeks  PLANNED INTERVENTIONS: Therapeutic exercises, Therapeutic activity, Neuromuscular re-education, Patient/Family education, Self Care, Joint mobilization, Dry Needling, Electrical stimulation, Cryotherapy, Moist heat, Manual therapy, and Re-evaluation  PLAN FOR NEXT SESSION:   Progress HEP  Cammie Mcgee, PT, DPT # [id number] Everlene Other, SPT 12/12/2022, 3:52 PM

## 2022-12-13 ENCOUNTER — Ambulatory Visit
Admission: RE | Admit: 2022-12-13 | Discharge: 2022-12-13 | Disposition: A | Discharge status: Home / Self Care | Payer: BC Managed Care – PPO | Source: Ambulatory Visit | Attending: Nurse Practitioner | Admitting: Nurse Practitioner

## 2022-12-13 DIAGNOSIS — Z1231 Encounter for screening mammogram for malignant neoplasm of breast: Secondary | ICD-10-CM | POA: Insufficient documentation

## 2022-12-14 ENCOUNTER — Ambulatory Visit: Payer: BC Managed Care – PPO | Admitting: Physical Therapy

## 2022-12-14 ENCOUNTER — Encounter: Payer: Self-pay | Admitting: Physical Therapy

## 2022-12-14 DIAGNOSIS — M25512 Pain in left shoulder: Secondary | ICD-10-CM | POA: Diagnosis not present

## 2022-12-14 DIAGNOSIS — M25612 Stiffness of left shoulder, not elsewhere classified: Secondary | ICD-10-CM

## 2022-12-14 DIAGNOSIS — G8929 Other chronic pain: Secondary | ICD-10-CM

## 2022-12-14 DIAGNOSIS — M6281 Muscle weakness (generalized): Secondary | ICD-10-CM

## 2022-12-14 NOTE — Therapy (Signed)
OUTPATIENT PHYSICAL THERAPY SHOULDER TREATMENT  Patient Name: Andrea Zhang MRN: [id number] DOB:Feb 21, 1961, 62 y.o., female Today's Date: 12/14/2022  END OF SESSION:  PT End of Session - 12/14/22 1123     Visit Number 9    Number of Visits 13    Date for PT Re-Evaluation 12/26/22    PT Start Time 1115    PT Stop Time 1206    PT Time Calculation (min) 51 min    Activity Tolerance Patient tolerated treatment well;No increased pain    Behavior During Therapy WFL for tasks assessed/performed             Past Medical History:  Diagnosis Date   Anemia    iron deficiency and b12 deficiency   Anxiety    Arthritis    Asthma    Diabetes mellitus without complication (HCC)    Fasciitis    left foot   GERD (gastroesophageal reflux disease)    History of kidney stones    Hypercholesterolemia    Hypertension    Migraines    MIGRAINES HAVE IMPROVED SINCE RECEIVING IRON   Past Surgical History:  Procedure Laterality Date   CHOLECYSTECTOMY  2004   COLONOSCOPY WITH ESOPHAGOGASTRODUODENOSCOPY (EGD)  02/2018   ESOPHAGOGASTRODUODENOSCOPY N/A 10/16/2020   Procedure: ESOPHAGOGASTRODUODENOSCOPY (EGD);  Surgeon: Regis Bill, MD;  Location: [facility] ENDOSCOPY;  Service: Endoscopy;  Laterality: N/A;   ESOPHAGOGASTRODUODENOSCOPY (EGD) WITH PROPOFOL N/A 06/06/2016   Procedure: ESOPHAGOGASTRODUODENOSCOPY (EGD) WITH PROPOFOL;  Surgeon: Scot Jun, MD;  Location: [facility] ENDOSCOPY;  Service: Endoscopy;  Laterality: N/A;   EXTRACORPOREAL SHOCK WAVE LITHOTRIPSY  2010   JOINT REPLACEMENT  2013   LT TKR   SAVORY DILATION N/A 06/06/2016   Procedure: SAVORY DILATION;  Surgeon: Scot Jun, MD;  Location: [facility] ENDOSCOPY;  Service: Endoscopy;  Laterality: N/A;   SAVORY DILATION  02/2018   SHOULDER ARTHROSCOPY WITH OPEN ROTATOR CUFF REPAIR Right 05/24/2018   Procedure: right shoulder arthroscopy, extensive arthroscopic debridement, decompression, open rotator cuff repair, biceps tenodesis;   Surgeon: Christena Flake, MD;  Location: [facility] ORS;  Service: Orthopedics;  Laterality: Right;   Patient Active Problem List   Diagnosis Date Noted   Coronary artery calcification 09/09/2019   Aortic atherosclerosis (HCC) 09/09/2019   Allergic rhinitis 08/06/2019   Asthma 08/06/2019   Diabetes mellitus, type 2 (HCC) 08/06/2019   Dysphagia 08/06/2019   GERD (gastroesophageal reflux disease) 08/06/2019   Hypercalcemia 08/06/2019   Hypertension 08/06/2019   Migraines 08/06/2019   Osteoarthritis 08/06/2019   Folate deficiency 03/03/2019   Morbid obesity with BMI of 40.0-44.9, adult (HCC) 06/05/2018   Status post right rotator cuff repair 06/05/2018   Degenerative joint disease of right shoulder 05/24/2018   Degenerative tear of glenoid labrum, right 05/24/2018   Injury of tendon of long head of right biceps 05/24/2018   Nontraumatic complete tear of right rotator cuff 05/14/2018   Rotator cuff tendinitis, right 05/14/2018   Iron deficiency anemia 01/15/2018   B12 deficiency 09/07/2017   Microalbuminuric diabetic nephropathy (HCC) 09/07/2017   Primary osteoarthritis of right knee 02/19/2015   Restless leg syndrome 10/25/2012   Hyperlipidemia with target LDL less than 100 05/20/2012    PCP: Dalia Heading, MD  REFERRING PROVIDER: Dedra Skeens, PA-C  REFERRING DIAG: Primary osteoarthritis of right knee   Chronic bursitis of left shoulder  THERAPY DIAG:  Chronic left shoulder pain  Shoulder joint stiffness, left  Muscle weakness (generalized)  Chronic pain of right knee  Rationale for Evaluation and  Treatment: Rehabilitation  ONSET DATE: 09/2022  SUBJECTIVE:                                                                                                                                                                                      SUBJECTIVE STATEMENT: Pt. Had PT in 2019 s/p R RTC repair.  Pt. Known to PT clinic and currently c/o L shoulder discomfort/ R knee pain.   Pt. Planning to have R TKA in future but not ready at this time.  Pt. Had injection in R knee recently with benefit.  L TKA 10 years ago.  Pt. R hand dominant.  Pt. Sleeps on back and is able to sleep on L shoulder with no issues.  2/10 L posterior shoulder pain at rest and 8/10 stabbing pain at worst in L shoulder.    PERTINENT HISTORY: [name], [place] - 11/09/2022 8:30 AM EST Formatting of this note is different from the original. Images from the original note were not included. Chief Complaint Chief Complaint Patient presents with Left Shoulder - Pain Right Knee - Pain  Reason for Visit The patient is a 62 year old female that presented for reevaluation of right knee. She has documented right knee degenerative joint disease. She had a left total knee replacement done by Dr. Gavin Potters about 10 years ago and has done very well. She has had cortisone injections in June 2023, and did well. The patient is requesting an injection in her right knee today. She is not interested in having knee surgery yet. She has to be careful with walking because of giving out symptoms. She has occasional sharp pain and popping in the right knee. She has swelling in the knee occasionally. The patient is a diabetic.  The patient is also here for reevaluation of her left shoulder. She is difficulty with getting dressed and overhead activities. She has pain on the side of her shoulder that is not going down the arm. She has no neck pain now. She has some biceps pain. She has no injury or trauma. She has continued doing exercises although has some discomfort. She denies any injury to the shoulder. She has no real weakness. She has no pain at nighttime or going behind her back. There is no numbness or tingling down the arm.  Medications Current Outpatient Medications Medication Sig Dispense Refill albuterol 90 mcg/actuation inhaler INHALE 2 INHALATIONS INTO THE LUNGS EVERY 4 HOURS AS NEEDED FOR WHEEZING 3 each  0 aspirin 81 MG EC tablet Take 81 mg by mouth daily. atenoloL (TENORMIN) 50 MG tablet TAKE 1 TABLET(50 MG) BY MOUTH EVERY DAY 90 tablet 1 blood-glucose sensor (FREESTYLE LIBRE 3 SENSOR)  Devi Apply to skin every 14 days for glucose monitoring 6 each 1 cholecalciferol (VITAMIN D3) 2,000 unit tablet Take 2,000 Units by mouth once daily. cyclobenzaprine (FLEXERIL) 10 MG tablet Take 1 tablet (10 mg total) by mouth 3 (three) times daily for 180 days 270 tablet 1 eletriptan (RELPAX) 40 MG tablet May take a second dose after 2 hours if needed. Pharmacist: Give maximum amount of medicine allowed/month by patient's insurance. 9 tablet 2 EMGALITY PEN 120 mg/mL PnIj INJECT 120MG  INTO THE SKIN MONTHLY 1 mL 5 gabapentin (NEURONTIN) 800 MG tablet TAKE 1 TABLET(800 MG) BY MOUTH AT BEDTIME 90 tablet 1 hydroCHLOROthiazide (HYDRODIURIL) 12.5 MG tablet Take 1 tablet (12.5 mg total) by mouth once daily 90 tablet 1 meloxicam (MOBIC) 15 MG tablet Take 1 tablet (15 mg total) by mouth once daily for 30 days 30 tablet 3 mirtazapine (REMERON) 15 MG tablet Take 1 tablet (15 mg total) by mouth at bedtime 90 tablet 1 multivitamin tablet Take 1 tablet by mouth once daily naratriptan (AMERGE) 2.5 MG tablet TAKE 1 TABLET BY MOUTH ONCE AS NEEDED FOR MIGRAINE, ADMINISTER DOSE AT ONSET OF SYMPTOMS, IF NO RELIEF, MAY REPEAT IN 2 HOURS 10 tablet 3 omeprazole (PRILOSEC) 40 MG DR capsule TAKE 1 CAPSULE(40 MG) BY MOUTH TWICE DAILY BEFORE MEALS 180 capsule 1 PREVIDENT 5000 BOOSTER PLUS 1.1 % dental paste Place onto teeth at bedtime promethazine (PHENERGAN) 25 MG tablet TAKE 1 TABLET(25 MG) BY MOUTH EVERY 8 HOURS AS NEEDED FOR NAUSEA 15 tablet 1 semaglutide (OZEMPIC) 1 mg/dose (4 mg/3 mL) pen injector Inject 0.75 mLs (1 mg total) subcutaneously once a week 9 mL 1 simvastatin (ZOCOR) 10 MG tablet Take 1 tablet (10 mg total) by mouth at bedtime 90 tablet 1 SUMAtriptan (IMITREX) 100 MG tablet TAKE 1 TABLET BY MOUTH AS DIRECTED FOR MIGRAINE,  MAY TAKE A SECOND DOSE AFTER 2 HOURS, IF NEEDED 10 tablet 3   PAIN:  Are you having pain? Yes: NPRS scale: 2/10 Pain location: L shoulder Pain description: stabbing/ sharp Aggravating factors: overhead reaching Relieving factors: rest  PRECAUTIONS: None  WEIGHT BEARING RESTRICTIONS: No  FALLS:  Has patient fallen in last 6 months? No  LIVING ENVIRONMENT: Lives with: lives with their spouse Lives in: House/apartment Has following equipment at home: NoneNone  OCCUPATION: Desk job/ computer work.    PLOF: Independent  PATIENT GOALS:  Increase L shoulder pain-free mobility/ prepare for R TKA  NEXT MD VISIT:   OBJECTIVE:   DIAGNOSTIC FINDINGS:  No L shoulder MRI  PATIENT SURVEYS:  FOTO initial 45/ goal 20  COGNITION: Overall cognitive status: Within functional limits for tasks assessed     SENSATION: WFL  POSTURE: Rounded shoulder/ B UT muscle tightness noted.   UPPER EXTREMITY ROM:   Active ROM Right eval Left eval  Shoulder flexion WNL 167 deg.  Shoulder extension WNL   Shoulder abduction WNL 165 deg.  Shoulder adduction WNL   Shoulder internal rotation WNL 85 deg.  Shoulder external rotation WNL 88 deg.  Elbow flexion WNL WNL  Elbow extension WNL WNL  Wrist flexion WNL WNL  Wrist extension    Wrist ulnar deviation    Wrist radial deviation    Wrist pronation    Wrist supination    (Blank rows = not tested)  Cervical: flexion (40 deg.), extension (52 deg.), L lat. Flex. (35 deg.), R lat. Flex. (35 deg.), L rotn. (72 deg.), R rotn. (69 deg.)  UPPER EXTREMITY MMT:  MMT Right eval Left eval  Shoulder  flexion 4/5 3/5  Shoulder extension    Shoulder abduction 4/5 3/5  Shoulder adduction 5/5 5/5  Shoulder internal rotation 4/5 4/5  Shoulder external rotation 4/5 3/5  Middle trapezius    Lower trapezius    Elbow flexion 5/5 4+/5  Elbow extension 5/5 5/5  Wrist flexion    Wrist extension    Wrist ulnar deviation    Wrist radial deviation     Wrist pronation    Wrist supination    Grip strength (lbs) 61# 57#  (Blank rows = not tested)  SHOULDER SPECIAL TESTS: Impingement tests: Neer impingement test: negative and Hawkins/Kennedy impingement test: negative Rotator cuff assessment: Empty can test: positive  and Jobe test (+) for L supraspinatus involvement  JOINT MOBILITY TESTING:  Good B shoulder capsular mobility.    PALPATION:  (+) L posterior deltoid/ supraspinatus tenderness   TODAY'S TREATMENT:                                                                                                                                         DATE: 12/14/22  Subjective:  Pt. Reports to PT with L shoulder pain of 1/10 on NPS and 3/10 R knee pain on NPS. Pt. is continuing independent bow flex exercises for her L shoulder with no increased pain. Pt. Reported she had questions about form and positioning of bowflex exercises that were addressed by PT.  There.ex.:  Nustep L5 10 min. B UE/LE.   B TKE's with black band with UE support if needed. 2x10 each side  Lateral stepping hip abduction in hallway with Blue TB applied @ knees. 3 half laps. (mid hallway to door/back)  Alternating 6" stair step ups to focus on quad strengthening. 2x20 reps.   Blue banded hip ext. With UE support for balance if needed. No LOB. 2x10 reps each leg.   Seated LAQ 4 lb.s 2x10 reps each leg.    Further discussed Bowflex exercise program and educated on preventing overloading of rotator cuff/knee joint.    PATIENT EDUCATION: Education details: Access Code: [email or web address] Person educated: Patient Education method: Explanation, Demonstration, and Handouts Education comprehension: verbalized understanding and returned demonstration  HOME EXERCISE PROGRAM: Access Code: [email or web address] URL: [email or web address] Date: 11/14/2022 Prepared by: Dorene Grebe   Exercises - Standing Isometric Shoulder Flexion with Doorway - Arm Bent  - 1 x daily - 7 x  weekly - 1 sets - 10 reps - 10 seconds hold - Standing Isometric Shoulder External Rotation with Doorway  - 1 x daily - 7 x weekly - 1 sets - 10 reps - 10 seconds hold - Standing Isometric Shoulder Extension with Doorway - Arm Bent  - 1 x daily - 7 x weekly - 1 sets - 10 reps - 10 seconds hold - Seated Scapular Retraction  - 1 x daily - 7 x weekly - 1 sets - 10 reps - 10 seconds hold  ASSESSMENT:  CLINICAL IMPRESSION: Pt. Arrived to PT with 3/10 pain on NPS in both the R knee and L shoulder. Pt. Has been able to perform an at home gym based exercise program to continue strengthening of her RC/shoulder. PT's main treatment focus has shifted toward pt.'s R knee and strengthening the surrounding muscles in order to prepare for a R TKA. Pt. Cued to drive force through quads and keep knee in line to avoid shifting of tibiofemoral joint during stair step ups. Pt. Had minimal increase in pain throughout today's treatment. Pt. Requires few rest breaks and shows moderate endurance. Pt. Cued to keep toes forward during lateral stepping and keep appropriate posture of her lumbar spine to prevent compensation of other muscles. PT continued to educate on bowflex exercise program involving amount of wt. to lift for each exercise as well as adjusting form/technique. Pt. Shows moderate weakness/decreased endurance with activities including R LE and would benefit from continued strengthening and functional dynamic movement training through skilled physical therapy treatment.   OBJECTIVE IMPAIRMENTS: decreased endurance, decreased mobility, decreased ROM, decreased strength, impaired UE functional use, improper body mechanics, postural dysfunction, and pain.   ACTIVITY LIMITATIONS: carrying, lifting, reach over head, and hygiene/grooming  PARTICIPATION LIMITATIONS: meal prep, cleaning, laundry, driving, shopping, and community activity  PERSONAL FACTORS: Past/current experiences are also affecting patient's functional  outcome.   REHAB POTENTIAL: Good  CLINICAL DECISION MAKING: Stable/uncomplicated  EVALUATION COMPLEXITY: Low   GOALS: Goals reviewed with patient? Yes  SHORT TERM GOALS: Target date: 12/05/22  Pt. Independent with HEP to increase L shoulder AROM to WNL as compared to R shoulder with no increase c/o pain.   Baseline: see above Goal status: Goal met   LONG TERM GOALS: Target date: 12/26/22  Pt. Will increase FOTO to 61 to improve pain-free mobility.   Baseline: initial 45 Goal status: INITIAL  2.  Pt. Will report <4/10 L shoulder pain worst with overhead reaching/ lifting tasks.   Baseline: 8/10 L shoulder stabbing pain at worst.   Goal status: INITIAL  3.  Pt. Will increase L shoulder strength 1/2 muscle grade to improve lifting/ carrying tasks.  Baseline: See above Goal status: INITIAL  4.  Pt. Will complete 30 minutes of there.ex./ gym based ex with no increase c/o L shoulder/ R knee pain to prepare for future R TKA.    Baseline: currently not exercising  Goal status: INITIAL   PLAN:  PT FREQUENCY: 2x/week  PT DURATION: 6 weeks  PLANNED INTERVENTIONS: Therapeutic exercises, Therapeutic activity, Neuromuscular re-education, Patient/Family education, Self Care, Joint mobilization, Dry Needling, Electrical stimulation, Cryotherapy, Moist heat, Manual therapy, and Re-evaluation  PLAN FOR NEXT SESSION:   Progress HEP/ 10th visit progress note next tx.   Cammie Mcgee, PT, DPT # [id number] Everlene Other, SPT 12/14/2022, 2:40 PM

## 2022-12-19 ENCOUNTER — Encounter: Payer: Self-pay | Admitting: Physical Therapy

## 2022-12-19 ENCOUNTER — Ambulatory Visit: Payer: BC Managed Care – PPO | Admitting: Physical Therapy

## 2022-12-19 DIAGNOSIS — G8929 Other chronic pain: Secondary | ICD-10-CM

## 2022-12-19 DIAGNOSIS — M25612 Stiffness of left shoulder, not elsewhere classified: Secondary | ICD-10-CM

## 2022-12-19 DIAGNOSIS — M6281 Muscle weakness (generalized): Secondary | ICD-10-CM

## 2022-12-19 DIAGNOSIS — M25512 Pain in left shoulder: Secondary | ICD-10-CM | POA: Diagnosis not present

## 2022-12-19 NOTE — Therapy (Signed)
OUTPATIENT PHYSICAL THERAPY SHOULDER TREATMENT Physical Therapy Progress Note  Dates of reporting period  11/14/22  to  12/19/22   Patient Name: Andrea Zhang MRN: [id number] DOB:[birth year], 62 y.o., female Today's Date: 12/19/2022  END OF SESSION:  PT End of Session - 12/19/22 1122     Visit Number 10    Number of Visits 13    Date for PT Re-Evaluation 12/26/22    PT Start Time 1117    PT Stop Time 1202    PT Time Calculation (min) 45 min    Activity Tolerance Patient tolerated treatment well;No increased pain    Behavior During Therapy WFL for tasks assessed/performed             Past Medical History:  Diagnosis Date   Anemia    iron deficiency and b12 deficiency   Anxiety    Arthritis    Asthma    Diabetes mellitus without complication (HCC)    Fasciitis    left foot   GERD (gastroesophageal reflux disease)    History of kidney stones    Hypercholesterolemia    Hypertension    Migraines    MIGRAINES HAVE IMPROVED SINCE RECEIVING IRON   Past Surgical History:  Procedure Laterality Date   CHOLECYSTECTOMY  2004   COLONOSCOPY WITH ESOPHAGOGASTRODUODENOSCOPY (EGD)  02/2018   ESOPHAGOGASTRODUODENOSCOPY N/A 10/16/2020   Procedure: ESOPHAGOGASTRODUODENOSCOPY (EGD);  Surgeon: Regis Bill, MD;  Location: [facility] ENDOSCOPY;  Service: Endoscopy;  Laterality: N/A;   ESOPHAGOGASTRODUODENOSCOPY (EGD) WITH PROPOFOL N/A 06/06/2016   Procedure: ESOPHAGOGASTRODUODENOSCOPY (EGD) WITH PROPOFOL;  Surgeon: Scot Jun, MD;  Location: [facility] ENDOSCOPY;  Service: Endoscopy;  Laterality: N/A;   EXTRACORPOREAL SHOCK WAVE LITHOTRIPSY  2010   JOINT REPLACEMENT  2013   LT TKR   SAVORY DILATION N/A 06/06/2016   Procedure: SAVORY DILATION;  Surgeon: Scot Jun, MD;  Location: [facility] ENDOSCOPY;  Service: Endoscopy;  Laterality: N/A;   SAVORY DILATION  02/2018   SHOULDER ARTHROSCOPY WITH OPEN ROTATOR CUFF REPAIR Right 05/24/2018   Procedure: right shoulder arthroscopy, extensive  arthroscopic debridement, decompression, open rotator cuff repair, biceps tenodesis;  Surgeon: Christena Flake, MD;  Location: [facility] ORS;  Service: Orthopedics;  Laterality: Right;   Patient Active Problem List   Diagnosis Date Noted   Coronary artery calcification 09/09/2019   Aortic atherosclerosis (HCC) 09/09/2019   Allergic rhinitis 08/06/2019   Asthma 08/06/2019   Diabetes mellitus, type 2 (HCC) 08/06/2019   Dysphagia 08/06/2019   GERD (gastroesophageal reflux disease) 08/06/2019   Hypercalcemia 08/06/2019   Hypertension 08/06/2019   Migraines 08/06/2019   Osteoarthritis 08/06/2019   Folate deficiency 03/03/2019   Morbid obesity with BMI of 40.0-44.9, adult (HCC) 06/05/2018   Status post right rotator cuff repair 06/05/2018   Degenerative joint disease of right shoulder 05/24/2018   Degenerative tear of glenoid labrum, right 05/24/2018   Injury of tendon of long head of right biceps 05/24/2018   Nontraumatic complete tear of right rotator cuff 05/14/2018   Rotator cuff tendinitis, right 05/14/2018   Iron deficiency anemia 01/15/2018   B12 deficiency 09/07/2017   Microalbuminuric diabetic nephropathy (HCC) 09/07/2017   Primary osteoarthritis of right knee 02/19/2015   Restless leg syndrome 10/25/2012   Hyperlipidemia with target LDL less than 100 05/20/2012    PCP: Dalia Heading, MD  REFERRING PROVIDER: Dedra Skeens, PA-C  REFERRING DIAG: Primary osteoarthritis of right knee   Chronic bursitis of left shoulder  THERAPY DIAG:  Chronic left shoulder pain  Shoulder joint stiffness,  left  Muscle weakness (generalized)  Rationale for Evaluation and Treatment: Rehabilitation  ONSET DATE: 09/2022  SUBJECTIVE:                                                                                                                                                                                      SUBJECTIVE STATEMENT: Pt. Had PT in 2019 s/p R RTC repair.  Pt. Known to PT clinic  and currently c/o L shoulder discomfort/ R knee pain.  Pt. Planning to have R TKA in future but not ready at this time.  Pt. Had injection in R knee recently with benefit.  L TKA 10 years ago.  Pt. R hand dominant.  Pt. Sleeps on back and is able to sleep on L shoulder with no issues.  2/10 L posterior shoulder pain at rest and 8/10 stabbing pain at worst in L shoulder.    PERTINENT HISTORY: [name], [place] - 11/09/2022 8:30 AM EST Formatting of this note is different from the original. Images from the original note were not included. Chief Complaint Chief Complaint Patient presents with Left Shoulder - Pain Right Knee - Pain  Reason for Visit The patient is a 62 year old female that presented for reevaluation of right knee. She has documented right knee degenerative joint disease. She had a left total knee replacement done by Dr. Gavin Potters about 10 years ago and has done very well. She has had cortisone injections in June 2023, and did well. The patient is requesting an injection in her right knee today. She is not interested in having knee surgery yet. She has to be careful with walking because of giving out symptoms. She has occasional sharp pain and popping in the right knee. She has swelling in the knee occasionally. The patient is a diabetic.  The patient is also here for reevaluation of her left shoulder. She is difficulty with getting dressed and overhead activities. She has pain on the side of her shoulder that is not going down the arm. She has no neck pain now. She has some biceps pain. She has no injury or trauma. She has continued doing exercises although has some discomfort. She denies any injury to the shoulder. She has no real weakness. She has no pain at nighttime or going behind her back. There is no numbness or tingling down the arm.  Medications Current Outpatient Medications Medication Sig Dispense Refill albuterol 90 mcg/actuation inhaler INHALE 2 INHALATIONS INTO  THE LUNGS EVERY 4 HOURS AS NEEDED FOR WHEEZING 3 each 0 aspirin 81 MG EC tablet Take 81 mg by mouth daily. atenoloL (TENORMIN) 50 MG tablet TAKE 1 TABLET(50 MG) BY MOUTH EVERY  DAY 90 tablet 1 blood-glucose sensor (FREESTYLE LIBRE 3 SENSOR) Devi Apply to skin every 14 days for glucose monitoring 6 each 1 cholecalciferol (VITAMIN D3) 2,000 unit tablet Take 2,000 Units by mouth once daily. cyclobenzaprine (FLEXERIL) 10 MG tablet Take 1 tablet (10 mg total) by mouth 3 (three) times daily for 180 days 270 tablet 1 eletriptan (RELPAX) 40 MG tablet May take a second dose after 2 hours if needed. Pharmacist: Give maximum amount of medicine allowed/month by patient's insurance. 9 tablet 2 EMGALITY PEN 120 mg/mL PnIj INJECT 120MG  INTO THE SKIN MONTHLY 1 mL 5 gabapentin (NEURONTIN) 800 MG tablet TAKE 1 TABLET(800 MG) BY MOUTH AT BEDTIME 90 tablet 1 hydroCHLOROthiazide (HYDRODIURIL) 12.5 MG tablet Take 1 tablet (12.5 mg total) by mouth once daily 90 tablet 1 meloxicam (MOBIC) 15 MG tablet Take 1 tablet (15 mg total) by mouth once daily for 30 days 30 tablet 3 mirtazapine (REMERON) 15 MG tablet Take 1 tablet (15 mg total) by mouth at bedtime 90 tablet 1 multivitamin tablet Take 1 tablet by mouth once daily naratriptan (AMERGE) 2.5 MG tablet TAKE 1 TABLET BY MOUTH ONCE AS NEEDED FOR MIGRAINE, ADMINISTER DOSE AT ONSET OF SYMPTOMS, IF NO RELIEF, MAY REPEAT IN 2 HOURS 10 tablet 3 omeprazole (PRILOSEC) 40 MG DR capsule TAKE 1 CAPSULE(40 MG) BY MOUTH TWICE DAILY BEFORE MEALS 180 capsule 1 PREVIDENT 5000 BOOSTER PLUS 1.1 % dental paste Place onto teeth at bedtime promethazine (PHENERGAN) 25 MG tablet TAKE 1 TABLET(25 MG) BY MOUTH EVERY 8 HOURS AS NEEDED FOR NAUSEA 15 tablet 1 semaglutide (OZEMPIC) 1 mg/dose (4 mg/3 mL) pen injector Inject 0.75 mLs (1 mg total) subcutaneously once a week 9 mL 1 simvastatin (ZOCOR) 10 MG tablet Take 1 tablet (10 mg total) by mouth at bedtime 90 tablet 1 SUMAtriptan (IMITREX) 100 MG  tablet TAKE 1 TABLET BY MOUTH AS DIRECTED FOR MIGRAINE, MAY TAKE A SECOND DOSE AFTER 2 HOURS, IF NEEDED 10 tablet 3   PAIN:  Are you having pain? Yes: NPRS scale: 2/10 Pain location: L shoulder Pain description: stabbing/ sharp Aggravating factors: overhead reaching Relieving factors: rest  PRECAUTIONS: None  WEIGHT BEARING RESTRICTIONS: No  FALLS:  Has patient fallen in last 6 months? No  LIVING ENVIRONMENT: Lives with: lives with their spouse Lives in: House/apartment Has following equipment at home: NoneNone  OCCUPATION: Desk job/ computer work.    PLOF: Independent  PATIENT GOALS:  Increase L shoulder pain-free mobility/ prepare for R TKA  NEXT MD VISIT:   OBJECTIVE:   DIAGNOSTIC FINDINGS:  No L shoulder MRI  PATIENT SURVEYS:  FOTO initial 45/ goal 58  COGNITION: Overall cognitive status: Within functional limits for tasks assessed     SENSATION: WFL  POSTURE: Rounded shoulder/ B UT muscle tightness noted.   UPPER EXTREMITY ROM:   Active ROM Right eval Left eval  Shoulder flexion WNL 167 deg.  Shoulder extension WNL   Shoulder abduction WNL 165 deg.  Shoulder adduction WNL   Shoulder internal rotation WNL 85 deg.  Shoulder external rotation WNL 88 deg.  Elbow flexion WNL WNL  Elbow extension WNL WNL  Wrist flexion WNL WNL  Wrist extension    Wrist ulnar deviation    Wrist radial deviation    Wrist pronation    Wrist supination    (Blank rows = not tested)  Cervical: flexion (40 deg.), extension (52 deg.), L lat. Flex. (35 deg.), R lat. Flex. (35 deg.), L rotn. (72 deg.), R rotn. (69 deg.)  UPPER  EXTREMITY MMT:  MMT Right eval Left eval  Shoulder flexion 4/5 3/5  Shoulder extension    Shoulder abduction 4/5 3/5  Shoulder adduction 5/5 5/5  Shoulder internal rotation 4/5 4/5  Shoulder external rotation 4/5 3/5  Middle trapezius    Lower trapezius    Elbow flexion 5/5 4+/5  Elbow extension 5/5 5/5  Wrist flexion    Wrist  extension    Wrist ulnar deviation    Wrist radial deviation    Wrist pronation    Wrist supination    Grip strength (lbs) 61# 57#  (Blank rows = not tested)  SHOULDER SPECIAL TESTS: Impingement tests: Neer impingement test: negative and Hawkins/Kennedy impingement test: negative Rotator cuff assessment: Empty can test: positive  and Jobe test (+) for L supraspinatus involvement  JOINT MOBILITY TESTING:  Good B shoulder capsular mobility.    PALPATION:  (+) L posterior deltoid/ supraspinatus tenderness   TODAY'S TREATMENT:                                                                                                                                         DATE: 12/19/22  Subjective:  Pt. Reports to PT with L shoulder pain of 0/10 on NPS and 1/10 R knee pain on NPS. Pt. Is reporting she no longer has L shoulder pain when donning her seatbelt. Pt. States she is not limited in her ADL's due to the R knee pain. Pt. reported continuing at home gym based exercise program for L shoulder impingement symptoms with successfully maintaining PT gains/decreased pain level.   There.ex.:  Nustep L5 10 min. B UE/LE. Seat 7. Arm 7.   Alternating 12" stair step lunges to focus on quad strengthening without increased force on tibiofemoral joint. 2x10 each side.   Lateral stepping hip abduction in hallway with Blue TB applied @ knees. 2 half laps. (mid hallway to door/back)  Marching in //-bars with 3 lb. Ankle wt.s. Pt. Cued to hold one leg stance 2 secs. To target knee stabalizers. 3 laps.   B Hip extension/Hamstring curls with 3. Lb. Ankle wt. In //-bars for UE support. 2x10 each.   Seated LAQ 4 lb.s 2x10 reps each leg to isolate quads.   Further discussed Bowflex exercise program and educated on preventing overloading of rotator cuff/knee joint.    PATIENT EDUCATION: Education details: Access Code: [email or web address] Person educated: Patient Education method: Explanation, Demonstration, and  Handouts Education comprehension: verbalized understanding and returned demonstration  HOME EXERCISE PROGRAM: Access Code: [email or web address] URL: [email or web address] Date: 11/14/2022 Prepared by: Dorene Grebe   Exercises - Standing Isometric Shoulder Flexion with Doorway - Arm Bent  - 1 x daily - 7 x weekly - 1 sets - 10 reps - 10 seconds hold - Standing Isometric Shoulder External Rotation with Doorway  - 1 x daily - 7 x weekly - 1 sets - 10 reps - 10 seconds hold -  Standing Isometric Shoulder Extension with Doorway - Arm Bent  - 1 x daily - 7 x weekly - 1 sets - 10 reps - 10 seconds hold - Seated Scapular Retraction  - 1 x daily - 7 x weekly - 1 sets - 10 reps - 10 seconds hold  ASSESSMENT:  CLINICAL IMPRESSION: Pt. Arrived to PT with 1/10 pain on NPS in the R knee and 0/10 pain on NPS in the L shoulder. Pt. Has been able to perform an at home gym based exercise program to continue strengthening of her RC/shoulder without any concerns or increased pain. Pt. Is maintaining PT progress made in regards to [facility] and continues to have decreased pain/increased strength. PT's main treatment focus has shifted toward pt.'s R knee and strengthening the surrounding muscles in order to prepare for a R TKA. Pt. Had minimal increase in pain throughout today's treatment that quickly resolved with termination of activity. Pt. Shows increased LE weakness with exercises involving one legged-stance and targeting knee stabilizers. Pt. Requires few rest breaks and shows moderate endurance. Pt. Cued to keep toes forward during lateral stepping and keep appropriate posture of her lumbar spine to prevent compensation of other muscles. PT continued to educate on bowflex exercise program involving amount of wt. to lift for each exercise as well as adjusting form/technique. Pt. Shows moderate weakness/decreased endurance with activities including R LE and would benefit from continued strengthening and functional  dynamic movement training through skilled physical therapy treatment.   OBJECTIVE IMPAIRMENTS: decreased endurance, decreased mobility, decreased ROM, decreased strength, impaired UE functional use, improper body mechanics, postural dysfunction, and pain.   ACTIVITY LIMITATIONS: carrying, lifting, reach over head, and hygiene/grooming  PARTICIPATION LIMITATIONS: meal prep, cleaning, laundry, driving, shopping, and community activity  PERSONAL FACTORS: Past/current experiences are also affecting patient's functional outcome.   REHAB POTENTIAL: Good  CLINICAL DECISION MAKING: Stable/uncomplicated  EVALUATION COMPLEXITY: Low   GOALS: Goals reviewed with patient? Yes  SHORT TERM GOALS: Target date: 12/05/22  Pt. Independent with HEP to increase L shoulder AROM to WNL as compared to R shoulder with no increase c/o pain.   Baseline: see above Goal status: Goal met   LONG TERM GOALS: Target date: 12/26/22  Pt. Will increase FOTO to 61 to improve pain-free mobility.   Baseline: initial 45 Goal status: On-going  2.  Pt. Will report <4/10 L shoulder pain worst with overhead reaching/ lifting tasks.   Baseline: 8/10 L shoulder stabbing pain at worst.   Goal status: partially met  3.  Pt. Will increase L shoulder strength 1/2 muscle grade to improve lifting/ carrying tasks.  Baseline: See above Goal status: Partially met  4.  Pt. Will complete 30 minutes of there.ex./ gym based ex with no increase c/o L shoulder/ R knee pain to prepare for future R TKA.    Baseline: currently not exercising  Goal status: Partially met   PLAN:  PT FREQUENCY: 2x/week  PT DURATION: 6 weeks  PLANNED INTERVENTIONS: Therapeutic exercises, Therapeutic activity, Neuromuscular re-education, Patient/Family education, Self Care, Joint mobilization, Dry Needling, Electrical stimulation, Cryotherapy, Moist heat, Manual therapy, and Re-evaluation  PLAN FOR NEXT SESSION:   Progress HEP/ CHECK FOTO  Cammie Mcgee, PT, DPT # [id number] Everlene Other, SPT 12/19/2022, 12:22 PM

## 2022-12-21 ENCOUNTER — Ambulatory Visit: Payer: BC Managed Care – PPO | Admitting: Physical Therapy

## 2022-12-21 DIAGNOSIS — M25612 Stiffness of left shoulder, not elsewhere classified: Secondary | ICD-10-CM

## 2022-12-21 DIAGNOSIS — M6281 Muscle weakness (generalized): Secondary | ICD-10-CM

## 2022-12-21 DIAGNOSIS — M25512 Pain in left shoulder: Secondary | ICD-10-CM | POA: Diagnosis not present

## 2022-12-21 DIAGNOSIS — G8929 Other chronic pain: Secondary | ICD-10-CM

## 2022-12-21 NOTE — Therapy (Signed)
OUTPATIENT PHYSICAL THERAPY SHOULDER TREATMENT   Patient Name: Andrea Zhang MRN: [id number] DOB:08/02/1961, 62 y.o., female Today's Date: 12/21/2022  END OF SESSION:  PT End of Session - 12/21/22 1117     Visit Number 11    Number of Visits 13    Date for PT Re-Evaluation 12/26/22    PT Start Time 1117    PT Stop Time 1207    PT Time Calculation (min) 50 min    Activity Tolerance Patient tolerated treatment well;No increased pain    Behavior During Therapy WFL for tasks assessed/performed             Past Medical History:  Diagnosis Date   Anemia    iron deficiency and b12 deficiency   Anxiety    Arthritis    Asthma    Diabetes mellitus without complication (HCC)    Fasciitis    left foot   GERD (gastroesophageal reflux disease)    History of kidney stones    Hypercholesterolemia    Hypertension    Migraines    MIGRAINES HAVE IMPROVED SINCE RECEIVING IRON   Past Surgical History:  Procedure Laterality Date   CHOLECYSTECTOMY  2004   COLONOSCOPY WITH ESOPHAGOGASTRODUODENOSCOPY (EGD)  02/2018   ESOPHAGOGASTRODUODENOSCOPY N/A 10/16/2020   Procedure: ESOPHAGOGASTRODUODENOSCOPY (EGD);  Surgeon: Regis Bill, MD;  Location: [facility] ENDOSCOPY;  Service: Endoscopy;  Laterality: N/A;   ESOPHAGOGASTRODUODENOSCOPY (EGD) WITH PROPOFOL N/A 06/06/2016   Procedure: ESOPHAGOGASTRODUODENOSCOPY (EGD) WITH PROPOFOL;  Surgeon: Scot Jun, MD;  Location: [facility] ENDOSCOPY;  Service: Endoscopy;  Laterality: N/A;   EXTRACORPOREAL SHOCK WAVE LITHOTRIPSY  2010   JOINT REPLACEMENT  2013   LT TKR   SAVORY DILATION N/A 06/06/2016   Procedure: SAVORY DILATION;  Surgeon: Scot Jun, MD;  Location: [facility] ENDOSCOPY;  Service: Endoscopy;  Laterality: N/A;   SAVORY DILATION  02/2018   SHOULDER ARTHROSCOPY WITH OPEN ROTATOR CUFF REPAIR Right 05/24/2018   Procedure: right shoulder arthroscopy, extensive arthroscopic debridement, decompression, open rotator cuff repair, biceps  tenodesis;  Surgeon: Christena Flake, MD;  Location: [facility] ORS;  Service: Orthopedics;  Laterality: Right;   Patient Active Problem List   Diagnosis Date Noted   Coronary artery calcification 09/09/2019   Aortic atherosclerosis (HCC) 09/09/2019   Allergic rhinitis 08/06/2019   Asthma 08/06/2019   Diabetes mellitus, type 2 (HCC) 08/06/2019   Dysphagia 08/06/2019   GERD (gastroesophageal reflux disease) 08/06/2019   Hypercalcemia 08/06/2019   Hypertension 08/06/2019   Migraines 08/06/2019   Osteoarthritis 08/06/2019   Folate deficiency 03/03/2019   Morbid obesity with BMI of 40.0-44.9, adult (HCC) 06/05/2018   Status post right rotator cuff repair 06/05/2018   Degenerative joint disease of right shoulder 05/24/2018   Degenerative tear of glenoid labrum, right 05/24/2018   Injury of tendon of long head of right biceps 05/24/2018   Nontraumatic complete tear of right rotator cuff 05/14/2018   Rotator cuff tendinitis, right 05/14/2018   Iron deficiency anemia 01/15/2018   B12 deficiency 09/07/2017   Microalbuminuric diabetic nephropathy (HCC) 09/07/2017   Primary osteoarthritis of right knee 02/19/2015   Restless leg syndrome 10/25/2012   Hyperlipidemia with target LDL less than 100 05/20/2012    PCP: Dalia Heading, MD  REFERRING PROVIDER: Dedra Skeens, PA-C  REFERRING DIAG: Primary osteoarthritis of right knee   Chronic bursitis of left shoulder  THERAPY DIAG:  Chronic left shoulder pain  Shoulder joint stiffness, left  Muscle weakness (generalized)  Rationale for Evaluation and Treatment: Rehabilitation  ONSET DATE:  09/2022  SUBJECTIVE:                                                                                                                                                                                      SUBJECTIVE STATEMENT: Pt. Had PT in 2019 s/p R RTC repair.  Pt. Known to PT clinic and currently c/o L shoulder discomfort/ R knee pain.  Pt. Planning to  have R TKA in future but not ready at this time.  Pt. Had injection in R knee recently with benefit.  L TKA 10 years ago.  Pt. R hand dominant.  Pt. Sleeps on back and is able to sleep on L shoulder with no issues.  2/10 L posterior shoulder pain at rest and 8/10 stabbing pain at worst in L shoulder.    PERTINENT HISTORY: [name], [place] - 11/09/2022 8:30 AM EST Formatting of this note is different from the original. Images from the original note were not included. Chief Complaint Chief Complaint Patient presents with Left Shoulder - Pain Right Knee - Pain  Reason for Visit The patient is a 62 year old female that presented for reevaluation of right knee. She has documented right knee degenerative joint disease. She had a left total knee replacement done by Dr. Gavin Potters about 10 years ago and has done very well. She has had cortisone injections in June 2023, and did well. The patient is requesting an injection in her right knee today. She is not interested in having knee surgery yet. She has to be careful with walking because of giving out symptoms. She has occasional sharp pain and popping in the right knee. She has swelling in the knee occasionally. The patient is a diabetic.  The patient is also here for reevaluation of her left shoulder. She is difficulty with getting dressed and overhead activities. She has pain on the side of her shoulder that is not going down the arm. She has no neck pain now. She has some biceps pain. She has no injury or trauma. She has continued doing exercises although has some discomfort. She denies any injury to the shoulder. She has no real weakness. She has no pain at nighttime or going behind her back. There is no numbness or tingling down the arm.  Medications Current Outpatient Medications Medication Sig Dispense Refill albuterol 90 mcg/actuation inhaler INHALE 2 INHALATIONS INTO THE LUNGS EVERY 4 HOURS AS NEEDED FOR WHEEZING 3 each 0 aspirin 81 MG EC  tablet Take 81 mg by mouth daily. atenoloL (TENORMIN) 50 MG tablet TAKE 1 TABLET(50 MG) BY MOUTH EVERY DAY 90 tablet 1 blood-glucose sensor (FREESTYLE LIBRE 3 SENSOR) Devi Apply to skin every  14 days for glucose monitoring 6 each 1 cholecalciferol (VITAMIN D3) 2,000 unit tablet Take 2,000 Units by mouth once daily. cyclobenzaprine (FLEXERIL) 10 MG tablet Take 1 tablet (10 mg total) by mouth 3 (three) times daily for 180 days 270 tablet 1 eletriptan (RELPAX) 40 MG tablet May take a second dose after 2 hours if needed. Pharmacist: Give maximum amount of medicine allowed/month by patient's insurance. 9 tablet 2 EMGALITY PEN 120 mg/mL PnIj INJECT 120MG  INTO THE SKIN MONTHLY 1 mL 5 gabapentin (NEURONTIN) 800 MG tablet TAKE 1 TABLET(800 MG) BY MOUTH AT BEDTIME 90 tablet 1 hydroCHLOROthiazide (HYDRODIURIL) 12.5 MG tablet Take 1 tablet (12.5 mg total) by mouth once daily 90 tablet 1 meloxicam (MOBIC) 15 MG tablet Take 1 tablet (15 mg total) by mouth once daily for 30 days 30 tablet 3 mirtazapine (REMERON) 15 MG tablet Take 1 tablet (15 mg total) by mouth at bedtime 90 tablet 1 multivitamin tablet Take 1 tablet by mouth once daily naratriptan (AMERGE) 2.5 MG tablet TAKE 1 TABLET BY MOUTH ONCE AS NEEDED FOR MIGRAINE, ADMINISTER DOSE AT ONSET OF SYMPTOMS, IF NO RELIEF, MAY REPEAT IN 2 HOURS 10 tablet 3 omeprazole (PRILOSEC) 40 MG DR capsule TAKE 1 CAPSULE(40 MG) BY MOUTH TWICE DAILY BEFORE MEALS 180 capsule 1 PREVIDENT 5000 BOOSTER PLUS 1.1 % dental paste Place onto teeth at bedtime promethazine (PHENERGAN) 25 MG tablet TAKE 1 TABLET(25 MG) BY MOUTH EVERY 8 HOURS AS NEEDED FOR NAUSEA 15 tablet 1 semaglutide (OZEMPIC) 1 mg/dose (4 mg/3 mL) pen injector Inject 0.75 mLs (1 mg total) subcutaneously once a week 9 mL 1 simvastatin (ZOCOR) 10 MG tablet Take 1 tablet (10 mg total) by mouth at bedtime 90 tablet 1 SUMAtriptan (IMITREX) 100 MG tablet TAKE 1 TABLET BY MOUTH AS DIRECTED FOR MIGRAINE, MAY TAKE A SECOND  DOSE AFTER 2 HOURS, IF NEEDED 10 tablet 3   PAIN:  Are you having pain? Yes: NPRS scale: 2/10 Pain location: L shoulder Pain description: stabbing/ sharp Aggravating factors: overhead reaching Relieving factors: rest  PRECAUTIONS: None  WEIGHT BEARING RESTRICTIONS: No  FALLS:  Has patient fallen in last 6 months? No  LIVING ENVIRONMENT: Lives with: lives with their spouse Lives in: House/apartment Has following equipment at home: NoneNone  OCCUPATION: Desk job/ computer work.    PLOF: Independent  PATIENT GOALS:  Increase L shoulder pain-free mobility/ prepare for R TKA  NEXT MD VISIT:   OBJECTIVE:   DIAGNOSTIC FINDINGS:  No L shoulder MRI  PATIENT SURVEYS:  FOTO initial 45/ goal 62  COGNITION: Overall cognitive status: Within functional limits for tasks assessed     SENSATION: WFL  POSTURE: Rounded shoulder/ B UT muscle tightness noted.   UPPER EXTREMITY ROM:   Active ROM Right eval Left eval  Shoulder flexion WNL 167 deg.  Shoulder extension WNL   Shoulder abduction WNL 165 deg.  Shoulder adduction WNL   Shoulder internal rotation WNL 85 deg.  Shoulder external rotation WNL 88 deg.  Elbow flexion WNL WNL  Elbow extension WNL WNL  Wrist flexion WNL WNL  Wrist extension    Wrist ulnar deviation    Wrist radial deviation    Wrist pronation    Wrist supination    (Blank rows = not tested)  Cervical: flexion (40 deg.), extension (52 deg.), L lat. Flex. (35 deg.), R lat. Flex. (35 deg.), L rotn. (72 deg.), R rotn. (69 deg.)  UPPER EXTREMITY MMT:  MMT Right eval Left eval  Shoulder flexion 4/5 3/5  Shoulder  extension    Shoulder abduction 4/5 3/5  Shoulder adduction 5/5 5/5  Shoulder internal rotation 4/5 4/5  Shoulder external rotation 4/5 3/5  Middle trapezius    Lower trapezius    Elbow flexion 5/5 4+/5  Elbow extension 5/5 5/5  Wrist flexion    Wrist extension    Wrist ulnar deviation    Wrist radial deviation    Wrist pronation     Wrist supination    Grip strength (lbs) 61# 57#  (Blank rows = not tested)  SHOULDER SPECIAL TESTS: Impingement tests: Neer impingement test: negative and Hawkins/Kennedy impingement test: negative Rotator cuff assessment: Empty can test: positive  and Jobe test (+) for L supraspinatus involvement  JOINT MOBILITY TESTING:  Good B shoulder capsular mobility.    PALPATION:  (+) L posterior deltoid/ supraspinatus tenderness   TODAY'S TREATMENT:                                                                                                                                         DATE: 12/21/22  Subjective:  Pt. Reports to PT with L shoulder pain of 0/10 on NPS and 1/10 R knee pain on NPS. Pt. Is reporting she no longer has L shoulder pain when donning her seatbelt. Pt. States she is not limited in her ADL's due to the R knee pain. Pt. reported continuing at home gym based exercise program for L shoulder impingement symptoms with successfully maintaining PT gains/decreased pain level.   There.ex.:  Nustep L5 10 min. B UE/LE. Seat 7. Arm 7.   Lateral stepping hip abduction in agility ladder with Blue TB applied @ knees. 3 laps.  Squats holding 6 lb. Ball in middle of chest to center gravity/BOS. Chair behind pt. For safety/squat depth cue. 3x10 reps.   Seated quad sets. 2x10 with 5 sec. hold each contraction. Pt. Cued to control contraction strength to keep pain level down.   B Hip extension w/ BTB In //-bars for UE support. 2x10 each.   Further discussed Bowflex exercise program and educated on preventing overloading of rotator cuff/knee joint.    PATIENT EDUCATION: Education details: Access Code: [email or web address] Person educated: Patient Education method: Explanation, Demonstration, and Handouts Education comprehension: verbalized understanding and returned demonstration  HOME EXERCISE PROGRAM: Access Code: [email or web address] URL: [email or web address] Date: 11/14/2022 Prepared  by: Dorene Grebe   Exercises - Standing Isometric Shoulder Flexion with Doorway - Arm Bent  - 1 x daily - 7 x weekly - 1 sets - 10 reps - 10 seconds hold - Standing Isometric Shoulder External Rotation with Doorway  - 1 x daily - 7 x weekly - 1 sets - 10 reps - 10 seconds hold - Standing Isometric Shoulder Extension with Doorway - Arm Bent  - 1 x daily - 7 x weekly - 1 sets - 10 reps - 10 seconds hold - Seated Scapular Retraction  - 1 x  daily - 7 x weekly - 1 sets - 10 reps - 10 seconds hold  ASSESSMENT:  CLINICAL IMPRESSION: Pt. Arrived to PT with 3/10 pain on NPS in the R knee and 0/10 pain on NPS in the L shoulder. Pt. Has been able to perform an at home gym based exercise program to continue strengthening of her RC/shoulder without any concerns or increased pain. PT reassessed pt. Goals made for L shoulder today and determined that the patient has met them all. Pt. Shows significant increase in L UE strength, significant decrease in pain with overhead activities, and made marked improvement of her FOTO score (see goals). Pt. Is maintaining PT progress made in regards to [facility] and continues to have decreased pain/increased strength. PT's main treatment focus has shifted toward pt.'s R knee and strengthening the surrounding muscles in order to prepare for a R TKA. Pt. Had no increase in pain throughout today's treatment and sx. Were closely monitored by PT. Pt. Shows increased LE weakness with exercises involving one legged-stance and targeting knee stabilizers. Pt. Requires few rest breaks and shows moderate endurance. Pt. Cued to keep toes forward during lateral stepping and keep appropriate posture of her lumbar spine to prevent compensation of other muscles. PT cued pt. On correct squat mechanics to avoid putting too much pressure on her tibiofemoral joint. Pt. Shows moderate weakness/decreased endurance with activities including R LE and would benefit from continued strengthening and functional  dynamic movement training through skilled physical therapy treatment.   OBJECTIVE IMPAIRMENTS: decreased endurance, decreased mobility, decreased ROM, decreased strength, impaired UE functional use, improper body mechanics, postural dysfunction, and pain.   ACTIVITY LIMITATIONS: carrying, lifting, reach over head, and hygiene/grooming  PARTICIPATION LIMITATIONS: meal prep, cleaning, laundry, driving, shopping, and community activity  PERSONAL FACTORS: Past/current experiences are also affecting patient's functional outcome.   REHAB POTENTIAL: Good  CLINICAL DECISION MAKING: Stable/uncomplicated  EVALUATION COMPLEXITY: Low   GOALS: Goals reviewed with patient? Yes  SHORT TERM GOALS: Target date: 12/05/22  Pt. Independent with HEP to increase L shoulder AROM to WNL as compared to R shoulder with no increase c/o pain.   Baseline: see above Goal status: Goal met   LONG TERM GOALS: Target date: 12/26/22  Pt. Will increase FOTO to 61 to improve pain-free mobility.   Baseline: initial 45 UPDATE(12/21/2022)- 70/61 Goal status: Goal Met  2.  Pt. Will report <4/10 L shoulder pain worst with overhead reaching/ lifting tasks.   Baseline: 8/10 L shoulder stabbing pain at worst.   Goal status: Goal Met  3.  Pt. Will increase L shoulder strength 1/2 muscle grade to improve lifting/ carrying tasks.  Baseline: See above Goal status: Goal Met - Retested 12/21/2022  4.  Pt. Will complete 30 minutes of there.ex./ gym based ex with no increase c/o L shoulder/ R knee pain to prepare for future R TKA.    Baseline: currently not exercising  Goal status: Goal Met   PLAN:  PT FREQUENCY: 2x/week  PT DURATION: 6 weeks  PLANNED INTERVENTIONS: Therapeutic exercises, Therapeutic activity, Neuromuscular re-education, Patient/Family education, Self Care, Joint mobilization, Dry Needling, Electrical stimulation, Cryotherapy, Moist heat, Manual therapy, and Re-evaluation  PLAN FOR NEXT SESSION:  Send  Recertification with update knee goals.    Cammie Mcgee, PT, DPT # [id number] Everlene Other, SPT 12/21/2022, 12:14 PM

## 2022-12-23 ENCOUNTER — Inpatient Hospital Stay: Payer: BC Managed Care – PPO | Attending: Oncology

## 2022-12-23 DIAGNOSIS — Z79899 Other long term (current) drug therapy: Secondary | ICD-10-CM | POA: Diagnosis not present

## 2022-12-23 DIAGNOSIS — D509 Iron deficiency anemia, unspecified: Secondary | ICD-10-CM

## 2022-12-23 LAB — IRON AND TIBC
Iron: 88 ug/dL (ref 28–170)
Saturation Ratios: 25 % (ref 10.4–31.8)
TIBC: 347 ug/dL (ref 250–450)
UIBC: 259 ug/dL

## 2022-12-23 LAB — FERRITIN: Ferritin: 82 ng/mL (ref 11–307)

## 2022-12-26 ENCOUNTER — Ambulatory Visit: Payer: BC Managed Care – PPO

## 2022-12-26 DIAGNOSIS — G8929 Other chronic pain: Secondary | ICD-10-CM

## 2022-12-26 DIAGNOSIS — M6281 Muscle weakness (generalized): Secondary | ICD-10-CM

## 2022-12-26 DIAGNOSIS — M25612 Stiffness of left shoulder, not elsewhere classified: Secondary | ICD-10-CM

## 2022-12-26 DIAGNOSIS — M25512 Pain in left shoulder: Secondary | ICD-10-CM | POA: Diagnosis not present

## 2022-12-26 NOTE — Therapy (Addendum)
OUTPATIENT PHYSICAL THERAPY SHOULDER TREATMENT   Patient Name: Andrea Zhang MRN: [id number] DOB:[birth year], 62 y.o., female Today's Date: 12/26/2022  END OF SESSION:  PT End of Session - 12/26/22 1119     Visit Number 12    Number of Visits 13    Date for PT Re-Evaluation 12/26/22    PT Start Time 1119    PT Stop Time 1204    PT Time Calculation (min) 45 min    Activity Tolerance Patient tolerated treatment well;No increased pain    Behavior During Therapy WFL for tasks assessed/performed             Past Medical History:  Diagnosis Date   Anemia    iron deficiency and b12 deficiency   Anxiety    Arthritis    Asthma    Diabetes mellitus without complication (HCC)    Fasciitis    left foot   GERD (gastroesophageal reflux disease)    History of kidney stones    Hypercholesterolemia    Hypertension    Migraines    MIGRAINES HAVE IMPROVED SINCE RECEIVING IRON   Past Surgical History:  Procedure Laterality Date   CHOLECYSTECTOMY  2004   COLONOSCOPY WITH ESOPHAGOGASTRODUODENOSCOPY (EGD)  02/2018   ESOPHAGOGASTRODUODENOSCOPY N/A 10/16/2020   Procedure: ESOPHAGOGASTRODUODENOSCOPY (EGD);  Surgeon: Regis Bill, MD;  Location: [facility] ENDOSCOPY;  Service: Endoscopy;  Laterality: N/A;   ESOPHAGOGASTRODUODENOSCOPY (EGD) WITH PROPOFOL N/A 06/06/2016   Procedure: ESOPHAGOGASTRODUODENOSCOPY (EGD) WITH PROPOFOL;  Surgeon: Scot Jun, MD;  Location: [facility] ENDOSCOPY;  Service: Endoscopy;  Laterality: N/A;   EXTRACORPOREAL SHOCK WAVE LITHOTRIPSY  2010   JOINT REPLACEMENT  2013   LT TKR   SAVORY DILATION N/A 06/06/2016   Procedure: SAVORY DILATION;  Surgeon: Scot Jun, MD;  Location: [facility] ENDOSCOPY;  Service: Endoscopy;  Laterality: N/A;   SAVORY DILATION  02/2018   SHOULDER ARTHROSCOPY WITH OPEN ROTATOR CUFF REPAIR Right 05/24/2018   Procedure: right shoulder arthroscopy, extensive arthroscopic debridement, decompression, open rotator cuff repair, biceps  tenodesis;  Surgeon: Christena Flake, MD;  Location: [facility] ORS;  Service: Orthopedics;  Laterality: Right;   Patient Active Problem List   Diagnosis Date Noted   Coronary artery calcification 09/09/2019   Aortic atherosclerosis (HCC) 09/09/2019   Allergic rhinitis 08/06/2019   Asthma 08/06/2019   Diabetes mellitus, type 2 (HCC) 08/06/2019   Dysphagia 08/06/2019   GERD (gastroesophageal reflux disease) 08/06/2019   Hypercalcemia 08/06/2019   Hypertension 08/06/2019   Migraines 08/06/2019   Osteoarthritis 08/06/2019   Folate deficiency 03/03/2019   Morbid obesity with BMI of 40.0-44.9, adult (HCC) 06/05/2018   Status post right rotator cuff repair 06/05/2018   Degenerative joint disease of right shoulder 05/24/2018   Degenerative tear of glenoid labrum, right 05/24/2018   Injury of tendon of long head of right biceps 05/24/2018   Nontraumatic complete tear of right rotator cuff 05/14/2018   Rotator cuff tendinitis, right 05/14/2018   Iron deficiency anemia 01/15/2018   B12 deficiency 09/07/2017   Microalbuminuric diabetic nephropathy (HCC) 09/07/2017   Primary osteoarthritis of right knee 02/19/2015   Restless leg syndrome 10/25/2012   Hyperlipidemia with target LDL less than 100 05/20/2012    PCP: Dalia Heading, MD  REFERRING PROVIDER: Dedra Skeens, PA-C  REFERRING DIAG: Primary osteoarthritis of right knee   Chronic bursitis of left shoulder  THERAPY DIAG:  Chronic left shoulder pain  Shoulder joint stiffness, left  Muscle weakness (generalized)  Rationale for Evaluation and Treatment: Rehabilitation  ONSET DATE:  09/2022  SUBJECTIVE:                                                                                                                                                                                      SUBJECTIVE STATEMENT: Pt. Had PT in 2019 s/p R RTC repair.  Pt. Known to PT clinic and currently c/o L shoulder discomfort/ R knee pain.  Pt. Planning to  have R TKA in future but not ready at this time.  Pt. Had injection in R knee recently with benefit.  L TKA 10 years ago.  Pt. R hand dominant.  Pt. Sleeps on back and is able to sleep on L shoulder with no issues.  2/10 L posterior shoulder pain at rest and 8/10 stabbing pain at worst in L shoulder.    PERTINENT HISTORY: [name], [place] - 11/09/2022 8:30 AM EST Formatting of this note is different from the original. Images from the original note were not included. Chief Complaint Chief Complaint Patient presents with Left Shoulder - Pain Right Knee - Pain  Reason for Visit The patient is a 62 year old female that presented for reevaluation of right knee. She has documented right knee degenerative joint disease. She had a left total knee replacement done by Dr. Gavin Potters about 10 years ago and has done very well. She has had cortisone injections in June 2023, and did well. The patient is requesting an injection in her right knee today. She is not interested in having knee surgery yet. She has to be careful with walking because of giving out symptoms. She has occasional sharp pain and popping in the right knee. She has swelling in the knee occasionally. The patient is a diabetic.  The patient is also here for reevaluation of her left shoulder. She is difficulty with getting dressed and overhead activities. She has pain on the side of her shoulder that is not going down the arm. She has no neck pain now. She has some biceps pain. She has no injury or trauma. She has continued doing exercises although has some discomfort. She denies any injury to the shoulder. She has no real weakness. She has no pain at nighttime or going behind her back. There is no numbness or tingling down the arm.  Medications Current Outpatient Medications Medication Sig Dispense Refill albuterol 90 mcg/actuation inhaler INHALE 2 INHALATIONS INTO THE LUNGS EVERY 4 HOURS AS NEEDED FOR WHEEZING 3 each 0 aspirin 81 MG EC  tablet Take 81 mg by mouth daily. atenoloL (TENORMIN) 50 MG tablet TAKE 1 TABLET(50 MG) BY MOUTH EVERY DAY 90 tablet 1 blood-glucose sensor (FREESTYLE LIBRE 3 SENSOR) Devi Apply to skin every  14 days for glucose monitoring 6 each 1 cholecalciferol (VITAMIN D3) 2,000 unit tablet Take 2,000 Units by mouth once daily. cyclobenzaprine (FLEXERIL) 10 MG tablet Take 1 tablet (10 mg total) by mouth 3 (three) times daily for 180 days 270 tablet 1 eletriptan (RELPAX) 40 MG tablet May take a second dose after 2 hours if needed. Pharmacist: Give maximum amount of medicine allowed/month by patient's insurance. 9 tablet 2 EMGALITY PEN 120 mg/mL PnIj INJECT 120MG  INTO THE SKIN MONTHLY 1 mL 5 gabapentin (NEURONTIN) 800 MG tablet TAKE 1 TABLET(800 MG) BY MOUTH AT BEDTIME 90 tablet 1 hydroCHLOROthiazide (HYDRODIURIL) 12.5 MG tablet Take 1 tablet (12.5 mg total) by mouth once daily 90 tablet 1 meloxicam (MOBIC) 15 MG tablet Take 1 tablet (15 mg total) by mouth once daily for 30 days 30 tablet 3 mirtazapine (REMERON) 15 MG tablet Take 1 tablet (15 mg total) by mouth at bedtime 90 tablet 1 multivitamin tablet Take 1 tablet by mouth once daily naratriptan (AMERGE) 2.5 MG tablet TAKE 1 TABLET BY MOUTH ONCE AS NEEDED FOR MIGRAINE, ADMINISTER DOSE AT ONSET OF SYMPTOMS, IF NO RELIEF, MAY REPEAT IN 2 HOURS 10 tablet 3 omeprazole (PRILOSEC) 40 MG DR capsule TAKE 1 CAPSULE(40 MG) BY MOUTH TWICE DAILY BEFORE MEALS 180 capsule 1 PREVIDENT 5000 BOOSTER PLUS 1.1 % dental paste Place onto teeth at bedtime promethazine (PHENERGAN) 25 MG tablet TAKE 1 TABLET(25 MG) BY MOUTH EVERY 8 HOURS AS NEEDED FOR NAUSEA 15 tablet 1 semaglutide (OZEMPIC) 1 mg/dose (4 mg/3 mL) pen injector Inject 0.75 mLs (1 mg total) subcutaneously once a week 9 mL 1 simvastatin (ZOCOR) 10 MG tablet Take 1 tablet (10 mg total) by mouth at bedtime 90 tablet 1 SUMAtriptan (IMITREX) 100 MG tablet TAKE 1 TABLET BY MOUTH AS DIRECTED FOR MIGRAINE, MAY TAKE A SECOND  DOSE AFTER 2 HOURS, IF NEEDED 10 tablet 3   PAIN:  Are you having pain? Yes: NPRS scale: 2/10 Pain location: L shoulder Pain description: stabbing/ sharp Aggravating factors: overhead reaching Relieving factors: rest  PRECAUTIONS: None  WEIGHT BEARING RESTRICTIONS: No  FALLS:  Has patient fallen in last 6 months? No  LIVING ENVIRONMENT: Lives with: lives with their spouse Lives in: House/apartment Has following equipment at home: NoneNone  OCCUPATION: Desk job/ computer work.    PLOF: Independent  PATIENT GOALS:  Increase L shoulder pain-free mobility/ prepare for R TKA  NEXT MD VISIT:   OBJECTIVE:   DIAGNOSTIC FINDINGS:  No L shoulder MRI  PATIENT SURVEYS:  FOTO initial 45/ goal 42  COGNITION: Overall cognitive status: Within functional limits for tasks assessed     SENSATION: WFL  POSTURE: Rounded shoulder/ B UT muscle tightness noted.   UPPER EXTREMITY ROM:   Active ROM Right eval Left eval  Shoulder flexion WNL 167 deg.  Shoulder extension WNL   Shoulder abduction WNL 165 deg.  Shoulder adduction WNL   Shoulder internal rotation WNL 85 deg.  Shoulder external rotation WNL 88 deg.  Elbow flexion WNL WNL  Elbow extension WNL WNL  Wrist flexion WNL WNL  Wrist extension    Wrist ulnar deviation    Wrist radial deviation    Wrist pronation    Wrist supination    (Blank rows = not tested)  Cervical: flexion (40 deg.), extension (52 deg.), L lat. Flex. (35 deg.), R lat. Flex. (35 deg.), L rotn. (72 deg.), R rotn. (69 deg.)  UPPER EXTREMITY MMT:  MMT Right eval Left eval  Shoulder flexion 4/5 3/5  Shoulder  extension    Shoulder abduction 4/5 3/5  Shoulder adduction 5/5 5/5  Shoulder internal rotation 4/5 4/5  Shoulder external rotation 4/5 3/5  Middle trapezius    Lower trapezius    Elbow flexion 5/5 4+/5  Elbow extension 5/5 5/5  Wrist flexion    Wrist extension    Wrist ulnar deviation    Wrist radial deviation    Wrist pronation     Wrist supination    Grip strength (lbs) 61# 57#  (Blank rows = not tested)  SHOULDER SPECIAL TESTS: Impingement tests: Neer impingement test: negative and Hawkins/Kennedy impingement test: negative Rotator cuff assessment: Empty can test: positive  and Jobe test (+) for L supraspinatus involvement  JOINT MOBILITY TESTING:  Good B shoulder capsular mobility.    PALPATION:  (+) L posterior deltoid/ supraspinatus tenderness   TODAY'S TREATMENT:                                                                                                                                         DATE: 12/26/22  Subjective:  Pt. Reports to PT with L shoulder pain of 0/10 on NPS and 1/10 R knee pain on NPS. Pt. Is reporting she no longer has L shoulder pain when donning her seatbelt. Pt. States she is not limited in her ADL's due to the R knee pain. Pt. reported continuing at home gym based exercise program for L shoulder impingement symptoms with successfully maintaining PT gains/decreased pain level.   There.ex.:  Nustep L5 10 min. B UE/LE. Seat 7. Arm 7.   Nautilus B : tricep ext. 2x10 reps 20# / scapular retraction 2x10 reps 30# / IR 10#  B Blue TB ER 2x10 reps. Pt. Cued to maintain elbow tucked and upper traps depressed.   Further discussed Bowflex exercise program and educated on preventing overloading of rotator cuff/knee joint.    PATIENT EDUCATION: Education details: Access Code: [email or web address] Person educated: Patient Education method: Explanation, Demonstration, and Handouts Education comprehension: verbalized understanding and returned demonstration  HOME EXERCISE PROGRAM: Access Code: [email or web address] URL: [email or web address] Date: 11/14/2022 Prepared by: Dorene Grebe   Exercises - Standing Isometric Shoulder Flexion with Doorway - Arm Bent  - 1 x daily - 7 x weekly - 1 sets - 10 reps - 10 seconds hold - Standing Isometric Shoulder External Rotation with Doorway  - 1 x daily -  7 x weekly - 1 sets - 10 reps - 10 seconds hold - Standing Isometric Shoulder Extension with Doorway - Arm Bent  - 1 x daily - 7 x weekly - 1 sets - 10 reps - 10 seconds hold - Seated Scapular Retraction  - 1 x daily - 7 x weekly - 1 sets - 10 reps - 10 seconds hold  ASSESSMENT:  CLINICAL IMPRESSION: Pt. Arrived to PT with 0/10 pain on NPS in the L shoulder. Pt. Has been able to perform an  at home gym based exercise program to continue strengthening of her RC/shoulder without any concerns or increased pain. Pt is compliant with HEP and will continue to achieve highest rehab potential . PT determined that pt. Has met all of her initial goals. Pt. shows significant increase in L UE strength, significant decrease in pain with overhead activities, and made marked improvement of her FOTO score (see goals). Pt. Is maintaining PT progress made in regards to [facility] and continues to have decreased pain/increased strength. Pt. Had no increase in pain throughout today's treatment. PT discussed discharge to pt. And the pt. Agreed with her improvements and feels comfortable moving forward with an independent gym based strengthening program. Exercise form/technique was closely monitored by PT today to ensure the proper muscles were being isolated so that the pt. Can carry that technique over to her independent HEP. Pt. Requires few rest breaks and shows increased endurance. Pt. No longer needs skilled PT treatment for her L UE. PT will follow-up with patient regularly to address any present UE sx. Pt. Will now be seen by PT this Wednesday for a R knee Evaluation.  OBJECTIVE IMPAIRMENTS: decreased endurance, decreased mobility, decreased ROM, decreased strength, impaired UE functional use, improper body mechanics, postural dysfunction, and pain.   ACTIVITY LIMITATIONS: carrying, lifting, reach over head, and hygiene/grooming  PARTICIPATION LIMITATIONS: meal prep, cleaning, laundry, driving, shopping, and community  activity  PERSONAL FACTORS: Past/current experiences are also affecting patient's functional outcome.   REHAB POTENTIAL: Good  CLINICAL DECISION MAKING: Stable/uncomplicated  EVALUATION COMPLEXITY: Low   GOALS: Goals reviewed with patient? Yes  SHORT TERM GOALS: Target date: 12/05/22  Pt. Independent with HEP to increase L shoulder AROM to WNL as compared to R shoulder with no increase c/o pain.   Baseline: see above Goal status: Goal met   LONG TERM GOALS: Target date: 12/26/22  Pt. Will increase FOTO to 61 to improve pain-free mobility.   Baseline: initial 45 UPDATE(12/21/2022)- 70/61 Goal status: Goal Met  2.  Pt. Will report <4/10 L shoulder pain worst with overhead reaching/ lifting tasks.   Baseline: 8/10 L shoulder stabbing pain at worst.   Goal status: Goal Met  3.  Pt. Will increase L shoulder strength 1/2 muscle grade to improve lifting/ carrying tasks.  Baseline: See above Goal status: Goal Met - Retested 12/21/2022  4.  Pt. Will complete 30 minutes of there.ex./ gym based ex with no increase c/o L shoulder/ R knee pain to prepare for future R TKA.    Baseline: currently not exercising  Goal status: Goal Met    PLAN:  PT FREQUENCY: 2x/week  PT DURATION: 6 weeks  PLANNED INTERVENTIONS: Therapeutic exercises, Therapeutic activity, Neuromuscular re-education, Patient/Family education, Self Care, Joint mobilization, Dry Needling, Electrical stimulation, Cryotherapy, Moist heat, Manual therapy, and Re-evaluation  PLAN FOR NEXT SESSION:  Send Recertification with update knee goals.    Janet Berlin PT DPT 9:39 AM,12/27/22  Everlene Other, SPT 12/26/2022, 2:45 PM

## 2022-12-28 ENCOUNTER — Ambulatory Visit: Payer: BC Managed Care – PPO

## 2022-12-28 DIAGNOSIS — M238X1 Other internal derangements of right knee: Secondary | ICD-10-CM

## 2022-12-28 DIAGNOSIS — R29898 Other symptoms and signs involving the musculoskeletal system: Secondary | ICD-10-CM

## 2022-12-28 DIAGNOSIS — R2689 Other abnormalities of gait and mobility: Secondary | ICD-10-CM

## 2022-12-28 DIAGNOSIS — G8929 Other chronic pain: Secondary | ICD-10-CM

## 2022-12-28 DIAGNOSIS — M25512 Pain in left shoulder: Secondary | ICD-10-CM | POA: Diagnosis not present

## 2022-12-28 NOTE — Therapy (Signed)
OUTPATIENT PHYSICAL THERAPY LOWER EXTREMITY EVALUATION   Patient Name: Andrea Zhang MRN: [id number] DOB:1961-04-29, 62 y.o., female Today's Date: 12/29/2022  END OF SESSION:  PT End of Session - 12/28/22 1116     Visit Number 1    Number of Visits 5    Date for PT Re-Evaluation 01/23/23    PT Start Time 1117    PT Stop Time 1203    PT Time Calculation (min) 46 min    Activity Tolerance Patient tolerated treatment well;No increased pain    Behavior During Therapy WFL for tasks assessed/performed             Past Medical History:  Diagnosis Date   Anemia    iron deficiency and b12 deficiency   Anxiety    Arthritis    Asthma    Diabetes mellitus without complication (HCC)    Fasciitis    left foot   GERD (gastroesophageal reflux disease)    History of kidney stones    Hypercholesterolemia    Hypertension    Migraines    MIGRAINES HAVE IMPROVED SINCE RECEIVING IRON   Past Surgical History:  Procedure Laterality Date   CHOLECYSTECTOMY  2004   COLONOSCOPY WITH ESOPHAGOGASTRODUODENOSCOPY (EGD)  02/2018   ESOPHAGOGASTRODUODENOSCOPY N/A 10/16/2020   Procedure: ESOPHAGOGASTRODUODENOSCOPY (EGD);  Surgeon: Regis Bill, MD;  Location: [facility] ENDOSCOPY;  Service: Endoscopy;  Laterality: N/A;   ESOPHAGOGASTRODUODENOSCOPY (EGD) WITH PROPOFOL N/A 06/06/2016   Procedure: ESOPHAGOGASTRODUODENOSCOPY (EGD) WITH PROPOFOL;  Surgeon: Scot Jun, MD;  Location: [facility] ENDOSCOPY;  Service: Endoscopy;  Laterality: N/A;   EXTRACORPOREAL SHOCK WAVE LITHOTRIPSY  2010   JOINT REPLACEMENT  2013   LT TKR   SAVORY DILATION N/A 06/06/2016   Procedure: SAVORY DILATION;  Surgeon: Scot Jun, MD;  Location: [facility] ENDOSCOPY;  Service: Endoscopy;  Laterality: N/A;   SAVORY DILATION  02/2018   SHOULDER ARTHROSCOPY WITH OPEN ROTATOR CUFF REPAIR Right 05/24/2018   Procedure: right shoulder arthroscopy, extensive arthroscopic debridement, decompression, open rotator cuff repair, biceps  tenodesis;  Surgeon: Christena Flake, MD;  Location: [facility] ORS;  Service: Orthopedics;  Laterality: Right;   Patient Active Problem List   Diagnosis Date Noted   Coronary artery calcification 09/09/2019   Aortic atherosclerosis (HCC) 09/09/2019   Allergic rhinitis 08/06/2019   Asthma 08/06/2019   Diabetes mellitus, type 2 (HCC) 08/06/2019   Dysphagia 08/06/2019   GERD (gastroesophageal reflux disease) 08/06/2019   Hypercalcemia 08/06/2019   Hypertension 08/06/2019   Migraines 08/06/2019   Osteoarthritis 08/06/2019   Folate deficiency 03/03/2019   Morbid obesity with BMI of 40.0-44.9, adult (HCC) 06/05/2018   Status post right rotator cuff repair 06/05/2018   Degenerative joint disease of right shoulder 05/24/2018   Degenerative tear of glenoid labrum, right 05/24/2018   Injury of tendon of long head of right biceps 05/24/2018   Nontraumatic complete tear of right rotator cuff 05/14/2018   Rotator cuff tendinitis, right 05/14/2018   Iron deficiency anemia 01/15/2018   B12 deficiency 09/07/2017   Microalbuminuric diabetic nephropathy (HCC) 09/07/2017   Primary osteoarthritis of right knee 02/19/2015   Restless leg syndrome 10/25/2012   Hyperlipidemia with target LDL less than 100 05/20/2012    PCP: Dalia Heading, MD  REFERRING PROVIDER: Dedra Skeens, PA-C   REFERRING DIAG: Unilateral primary osteoarthritis, right knee  THERAPY DIAG:  Chronic pain of right knee  Weakness of both lower extremities  Other abnormalities of gait and mobility  Laxity of knee joint, right  Rationale for Evaluation  and Treatment: Rehabilitation  ONSET DATE: Issues since 1997 after a fall.   SUBJECTIVE:   SUBJECTIVE STATEMENT: Pt. States B knee pain since 1997. Pt. States a slight ache (1/10 pain on NPS) in L knee occasionally and tightness above and below it. Pt. States her main concern is her R knee and that she has a significant amount of pain with all activities especially stairs and  single leg stance weight bearing. The pain keeps the pt. Awake at night about once a week when the pain is at its worst.   PERTINENT HISTORY: Pt. Has had a L TKA and is preparing for R knee TKA. Pt. Had a fall in 1997 that caused B knee pain. The fall was due to taking heavy migraine medication that caused her to sleep walk down the stairs. Pt. Had first TKA in L knee in 2013. Pt. Has had a L knee partial menisectomy. Pt. Has significant hx. Of Iron deficiency that is now controlled.  Past Medical History:  Diagnosis Date   Anemia      iron deficiency and b12 deficiency   Anxiety     Arthritis     Asthma     Diabetes mellitus without complication (HCC)     Fasciitis      left foot   GERD (gastroesophageal reflux disease)     History of kidney stones     Hypercholesterolemia     Hypertension     Migraines      MIGRAINES HAVE IMPROVED SINCE RECEIVING IRON         Past Surgical History:  Procedure Laterality Date   CHOLECYSTECTOMY   2004   COLONOSCOPY WITH ESOPHAGOGASTRODUODENOSCOPY (EGD)   02/2018   ESOPHAGOGASTRODUODENOSCOPY N/A 10/16/2020    Procedure: ESOPHAGOGASTRODUODENOSCOPY (EGD);  Surgeon: Regis Bill, MD;  Location: [facility] ENDOSCOPY;  Service: Endoscopy;  Laterality: N/A;   ESOPHAGOGASTRODUODENOSCOPY (EGD) WITH PROPOFOL N/A 06/06/2016    Procedure: ESOPHAGOGASTRODUODENOSCOPY (EGD) WITH PROPOFOL;  Surgeon: Scot Jun, MD;  Location: [facility] ENDOSCOPY;  Service: Endoscopy;  Laterality: N/A;   EXTRACORPOREAL SHOCK WAVE LITHOTRIPSY   2010   JOINT REPLACEMENT   2013    LT TKR   SAVORY DILATION N/A 06/06/2016    Procedure: SAVORY DILATION;  Surgeon: Scot Jun, MD;  Location: [facility] ENDOSCOPY;  Service: Endoscopy;  Laterality: N/A;   SAVORY DILATION   02/2018   SHOULDER ARTHROSCOPY WITH OPEN ROTATOR CUFF REPAIR Right 05/24/2018    Procedure: right shoulder arthroscopy, extensive arthroscopic debridement, decompression, open rotator cuff repair, biceps tenodesis;   Surgeon: Christena Flake, MD;  Location: [facility] ORS;  Service: Orthopedics;  Laterality: Right;        Patient Active Problem List    Diagnosis Date Noted   Coronary artery calcification 09/09/2019   Aortic atherosclerosis (HCC) 09/09/2019   Allergic rhinitis 08/06/2019   Asthma 08/06/2019   Diabetes mellitus, type 2 (HCC) 08/06/2019   Dysphagia 08/06/2019   GERD (gastroesophageal reflux disease) 08/06/2019   Hypercalcemia 08/06/2019   Hypertension 08/06/2019   Migraines 08/06/2019   Osteoarthritis 08/06/2019   Folate deficiency 03/03/2019   Morbid obesity with BMI of 40.0-44.9, adult (HCC) 06/05/2018   Status post right rotator cuff repair 06/05/2018   Degenerative joint disease of right shoulder 05/24/2018   Degenerative tear of glenoid labrum, right 05/24/2018   Injury of tendon of long head of right biceps 05/24/2018   Nontraumatic complete tear of right rotator cuff 05/14/2018   Rotator cuff tendinitis, right 05/14/2018   Iron  deficiency anemia 01/15/2018   B12 deficiency 09/07/2017   Microalbuminuric diabetic nephropathy (HCC) 09/07/2017   Primary osteoarthritis of right knee 02/19/2015   Restless leg syndrome 10/25/2012   Hyperlipidemia with target LDL less than 100 05/20/2012    PAIN:  Are you having pain Yes: R knee NPRS scale: 1/10 at rest, 4/10 most of the time, 9/10 at worst Pain location: anterior joint line  Pain description: sharp, stabbing, achy Aggravating factors: stairs, chores with lifting, squatting without UE support Relieving factors: weighted blanket, rubbing area, heat, Cortisone Injections since 2009 totaling 6.   PRECAUTIONS: None  WEIGHT BEARING RESTRICTIONS: No  FALLS:  Has patient fallen in last 6 months? No  LIVING ENVIRONMENT: Lives with: lives with their family Lives in: House/apartment Stairs: Yes: Internal: 12 steps; on left going up and External: 4 steps; on left going up Has following equipment at home: None  OCCUPATION: Desk  work  PLOF: Independent  PATIENT GOALS: Pain free walks with her husband (20 min.), complete daily chores, dressing without sitting, less UE support.   NEXT MD VISIT: TBD  OBJECTIVE:   DIAGNOSTIC FINDINGS: Refer to MD chart/note  PATIENT SURVEYS:  FOTO 47/53  COGNITION: Overall cognitive status: Within functional limits for tasks assessed     SENSATION: WFL  POSTURE: rounded shoulders and forward head    PALPATION: Tenderness to palpation at ant. R knee joint line.   LOWER EXTREMITY ROM:  Active ROM Right eval Left eval  Hip flexion WNL WNL  Hip extension    Hip abduction WNL WNL  Hip adduction WNL WNL  Hip internal rotation WNL WNL  Hip external rotation WNL WNL  Knee flexion 123 deg. 129 deg.  Knee extension 4 deg. 0 deg.  Ankle dorsiflexion WNL WNL  Ankle plantarflexion WNL WNL  Ankle inversion    Ankle eversion     (Blank rows = not tested)  LOWER EXTREMITY MMT:  MMT Right eval Left eval  Hip flexion 4-/5 4-/5  Hip extension    Hip abduction 4/5 4/5  Hip adduction    Hip internal rotation 4/5 4/5  Hip external rotation 4-/5 4/5  Knee flexion 5/5 5/5  Knee extension 4-/5 4/5  Ankle dorsiflexion 5/5 5/5  Ankle plantarflexion    Ankle inversion    Ankle eversion     (Blank rows = not tested)  LOWER EXTREMITY SPECIAL TESTS:  Knee special tests: Anterior drawer test: negative, McMurray's test: negative, and MCL(valgus)/LCL(Varus) + @ 20 deg. Knee flex.   FUNCTIONAL TESTS:  Timed up and go (TUG): 9.27 10 meter walk test: 8.01 Balance assessment: Single leg stance: 30 sec. On R LE, 15 sec. On L LE Standing with eyes closed for 30 sec: Completed with no LOB.  Tandem stance (alternating leading foot): completed for 30 sec.   GAIT: Distance walked: 10 m Assistive device utilized: None Level of assistance: Complete Independence Comments: Pt. Demonstrated antalgic gait of R LE with decreased stance time on the R LE. Pt. Demonstrate decreased arm  swing and decreased step cadence due to feelings of instability in R knee.    TODAY'S TREATMENT:  DATE: 12/28/2022   LE knee Evaluation. Pt. Completed a serious of functional tests.   PATIENT EDUCATION:  Education details: Pt. Educated on PT finding regarding her R knee joint. Pt. Educated on the weaknesses found in her LE during the evaluation as well as the excess laxity within the R knee joint. Pt. Educated on PT's plan for approach at treatment. PT gave pt. Education on the benefits of PT prior to TKA of R knee while also giving her autonomy to make decisions based on insurance/finances.  Person educated: Patient Education method: Explanation, Demonstration, and Verbal cues Education comprehension: verbalized understanding  HOME EXERCISE PROGRAM: TBD  ASSESSMENT:  CLINICAL IMPRESSION: Patient is a 62 y.o. female who was seen today for physical therapy evaluation and treatment for chronic R knee pain. Pt. Has significant history of B knee pain since 1997. Pt. Had a TKA on her L knee in 2013 that gives her occasional pain and tightness but is not significantly affecting her day to day. Pt. Reports to PT with her main concern being her R knee. Pt. Has 4/10 R knee pain on a daily basis that can exacerbate up to a 10/10 with stairs or prolonged activity such as long walks. PT observed pt.'s functional gait and found that the pt. Demonstrated decreased step cadence, decreased arm swing, antalgic gait of the R LE, and decreased stance time on the R LE due to pain. PT performed McMurray's test for suspicion of a meniscus pathology that came back neg. PT performed an anterior drawer test (+)  for laxity and a Genu varus and Valgus test MCL/LCL (+) test due to the amount of laxity found in the R knee joint. Pt. Demonstrated weakness of her LE muscles (see above) that may be  contributing to the significant R knee pain. Pt. Demonstrated slight mobility deficits in R knee extension and B flexion (see above) but was WNL in all other planes of motion. PT performed TUG, 10 m walk test, and a series of balance tasks to gather objective and functional measurements on pt. (See times above.) PT determined that pt. Is borderline between fall risk/non-fall risk category. PT will focus on functional activities to address/improve functional measurements obtained to help improve pt. endurance, strength, and overall quality of movement. PT will begin next treatment with establishing an extensive HEP to prepare patient for TKA. PT determined pt. Would benefit from skilled PT services 1x week for 4 weeks to improve pt.'s overall safety, function, and QoL until TKA can be performed.   OBJECTIVE IMPAIRMENTS: Abnormal gait, decreased activity tolerance, decreased balance, decreased coordination, decreased endurance, decreased mobility, difficulty walking, decreased strength, and pain.   ACTIVITY LIMITATIONS: carrying, lifting, squatting, sleeping, stairs, bathing, and dressing  PARTICIPATION LIMITATIONS: cleaning, laundry, shopping, community activity, and yard work  PERSONAL FACTORS: Age, Past/current experiences, and Time since onset of injury/illness/exacerbation are also affecting patient's functional outcome.   REHAB POTENTIAL: Good  CLINICAL DECISION MAKING: Evolving/moderate complexity  EVALUATION COMPLEXITY: Moderate   GOALS: Goals reviewed with patient? Yes  SHORT TERM GOALS: Target date: 01/09/2023 Pt will be independent with HEP to improve strength and decrease knee pain to improve pain-free function and prepare for R knee TKA.  Baseline: See deficits Above. New HEP.  Goal status: INITIAL   LONG TERM GOALS: Target date: 01/23/2023  Pt. Will increase FOTO score to 53 to increase functional pain free mobility.  Baseline: 47/53 Goal status: INITIAL  2.  Pt. Will be  able to walk for 20 min.  With <2/10 pain on NPS With a normal gait pattern and an absence of R LE antalgic gait.  Baseline: Cannot walk more than 5 min. Without increase in pain (10/10 NPS) and demonstrates abnormal gait pattern due to pain/instability. Goal status: INITIAL  3.  Pt. Will increase overall B LE strength by a 1/2 muscle grade to increase functional mobility and decrease stress on R tibiofemoral joint.  Baseline: (See above.) Goal status: INITIAL  PLAN:  PT FREQUENCY: 1x/week  PT DURATION: 4 weeks  PLANNED INTERVENTIONS: Therapeutic exercises, Therapeutic activity, Neuromuscular re-education, Balance training, Gait training, Patient/Family education, Self Care, Joint mobilization, Joint manipulation, Stair training, Dry Needling, Electrical stimulation, Cryotherapy, Moist heat, Traction, Ultrasound, and Manual therapy  PLAN FOR NEXT SESSION: Establish extensive HEP to prepare for R TKA.    Everlene Other, Student-PT 12/29/2022, 9:11 AM  Janet Berlin PT DPT 1:19 PM,12/29/22

## 2023-01-02 ENCOUNTER — Ambulatory Visit: Payer: BC Managed Care – PPO | Admitting: Physical Therapy

## 2023-01-02 DIAGNOSIS — M25512 Pain in left shoulder: Secondary | ICD-10-CM | POA: Diagnosis not present

## 2023-01-02 DIAGNOSIS — M238X1 Other internal derangements of right knee: Secondary | ICD-10-CM

## 2023-01-02 DIAGNOSIS — G8929 Other chronic pain: Secondary | ICD-10-CM

## 2023-01-02 DIAGNOSIS — R2689 Other abnormalities of gait and mobility: Secondary | ICD-10-CM

## 2023-01-02 DIAGNOSIS — R29898 Other symptoms and signs involving the musculoskeletal system: Secondary | ICD-10-CM

## 2023-01-02 NOTE — Therapy (Unsigned)
OUTPATIENT PHYSICAL THERAPY LOWER EXTREMITY TREATMENT   Patient Name: Andrea Zhang MRN: [id number] DOB:[birth year], 62 y.o., female Today's Date: 01/02/2023  END OF SESSION:  PT End of Session - 01/02/23 1119     Visit Number 2    Number of Visits 5    Date for PT Re-Evaluation 01/23/23    PT Start Time 1119    PT Stop Time 1210    PT Time Calculation (min) 51 min    Activity Tolerance Patient tolerated treatment well;No increased pain    Behavior During Therapy WFL for tasks assessed/performed             Past Medical History:  Diagnosis Date   Anemia    iron deficiency and b12 deficiency   Anxiety    Arthritis    Asthma    Diabetes mellitus without complication (HCC)    Fasciitis    left foot   GERD (gastroesophageal reflux disease)    History of kidney stones    Hypercholesterolemia    Hypertension    Migraines    MIGRAINES HAVE IMPROVED SINCE RECEIVING IRON   Past Surgical History:  Procedure Laterality Date   CHOLECYSTECTOMY  2004   COLONOSCOPY WITH ESOPHAGOGASTRODUODENOSCOPY (EGD)  02/2018   ESOPHAGOGASTRODUODENOSCOPY N/A 10/16/2020   Procedure: ESOPHAGOGASTRODUODENOSCOPY (EGD);  Surgeon: Regis Bill, MD;  Location: [facility] ENDOSCOPY;  Service: Endoscopy;  Laterality: N/A;   ESOPHAGOGASTRODUODENOSCOPY (EGD) WITH PROPOFOL N/A 06/06/2016   Procedure: ESOPHAGOGASTRODUODENOSCOPY (EGD) WITH PROPOFOL;  Surgeon: Scot Jun, MD;  Location: [facility] ENDOSCOPY;  Service: Endoscopy;  Laterality: N/A;   EXTRACORPOREAL SHOCK WAVE LITHOTRIPSY  2010   JOINT REPLACEMENT  2013   LT TKR   SAVORY DILATION N/A 06/06/2016   Procedure: SAVORY DILATION;  Surgeon: Scot Jun, MD;  Location: [facility] ENDOSCOPY;  Service: Endoscopy;  Laterality: N/A;   SAVORY DILATION  02/2018   SHOULDER ARTHROSCOPY WITH OPEN ROTATOR CUFF REPAIR Right 05/24/2018   Procedure: right shoulder arthroscopy, extensive arthroscopic debridement, decompression, open rotator cuff repair, biceps  tenodesis;  Surgeon: Christena Flake, MD;  Location: [facility] ORS;  Service: Orthopedics;  Laterality: Right;   Patient Active Problem List   Diagnosis Date Noted   Coronary artery calcification 09/09/2019   Aortic atherosclerosis (HCC) 09/09/2019   Allergic rhinitis 08/06/2019   Asthma 08/06/2019   Diabetes mellitus, type 2 (HCC) 08/06/2019   Dysphagia 08/06/2019   GERD (gastroesophageal reflux disease) 08/06/2019   Hypercalcemia 08/06/2019   Hypertension 08/06/2019   Migraines 08/06/2019   Osteoarthritis 08/06/2019   Folate deficiency 03/03/2019   Morbid obesity with BMI of 40.0-44.9, adult (HCC) 06/05/2018   Status post right rotator cuff repair 06/05/2018   Degenerative joint disease of right shoulder 05/24/2018   Degenerative tear of glenoid labrum, right 05/24/2018   Injury of tendon of long head of right biceps 05/24/2018   Nontraumatic complete tear of right rotator cuff 05/14/2018   Rotator cuff tendinitis, right 05/14/2018   Iron deficiency anemia 01/15/2018   B12 deficiency 09/07/2017   Microalbuminuric diabetic nephropathy (HCC) 09/07/2017   Primary osteoarthritis of right knee 02/19/2015   Restless leg syndrome 10/25/2012   Hyperlipidemia with target LDL less than 100 05/20/2012    PCP: Dalia Heading, MD  REFERRING PROVIDER: Dedra Skeens, PA-C   REFERRING DIAG: Unilateral primary osteoarthritis, right knee  THERAPY DIAG:  Chronic pain of right knee  Weakness of both lower extremities  Other abnormalities of gait and mobility  Laxity of knee joint, right  Rationale for Evaluation  and Treatment: Rehabilitation  ONSET DATE: Issues since 1997 after a fall.   SUBJECTIVE:   SUBJECTIVE STATEMENT: Pt. States B knee pain since 1997. Pt. States a slight ache (1/10 pain on NPS) in L knee occasionally and tightness above and below it. Pt. States her main concern is her R knee and that she has a significant amount of pain with all activities especially stairs and  single leg stance weight bearing. The pain keeps the pt. Awake at night about once a week when the pain is at its worst.   PERTINENT HISTORY: Pt. Has had a L TKA and is preparing for R knee TKA. Pt. Had a fall in 1997 that caused B knee pain. The fall was due to taking heavy migraine medication that caused her to sleep walk down the stairs. Pt. Had first TKA in L knee in 2013. Pt. Has had a L knee partial menisectomy. Pt. Has significant hx. Of Iron deficiency that is now controlled.  Past Medical History:  Diagnosis Date   Anemia      iron deficiency and b12 deficiency   Anxiety     Arthritis     Asthma     Diabetes mellitus without complication (HCC)     Fasciitis      left foot   GERD (gastroesophageal reflux disease)     History of kidney stones     Hypercholesterolemia     Hypertension     Migraines      MIGRAINES HAVE IMPROVED SINCE RECEIVING IRON         Past Surgical History:  Procedure Laterality Date   CHOLECYSTECTOMY   2004   COLONOSCOPY WITH ESOPHAGOGASTRODUODENOSCOPY (EGD)   02/2018   ESOPHAGOGASTRODUODENOSCOPY N/A 10/16/2020    Procedure: ESOPHAGOGASTRODUODENOSCOPY (EGD);  Surgeon: Regis Bill, MD;  Location: [facility] ENDOSCOPY;  Service: Endoscopy;  Laterality: N/A;   ESOPHAGOGASTRODUODENOSCOPY (EGD) WITH PROPOFOL N/A 06/06/2016    Procedure: ESOPHAGOGASTRODUODENOSCOPY (EGD) WITH PROPOFOL;  Surgeon: Scot Jun, MD;  Location: [facility] ENDOSCOPY;  Service: Endoscopy;  Laterality: N/A;   EXTRACORPOREAL SHOCK WAVE LITHOTRIPSY   2010   JOINT REPLACEMENT   2013    LT TKR   SAVORY DILATION N/A 06/06/2016    Procedure: SAVORY DILATION;  Surgeon: Scot Jun, MD;  Location: [facility] ENDOSCOPY;  Service: Endoscopy;  Laterality: N/A;   SAVORY DILATION   02/2018   SHOULDER ARTHROSCOPY WITH OPEN ROTATOR CUFF REPAIR Right 05/24/2018    Procedure: right shoulder arthroscopy, extensive arthroscopic debridement, decompression, open rotator cuff repair, biceps tenodesis;   Surgeon: Christena Flake, MD;  Location: [facility] ORS;  Service: Orthopedics;  Laterality: Right;        Patient Active Problem List    Diagnosis Date Noted   Coronary artery calcification 09/09/2019   Aortic atherosclerosis (HCC) 09/09/2019   Allergic rhinitis 08/06/2019   Asthma 08/06/2019   Diabetes mellitus, type 2 (HCC) 08/06/2019   Dysphagia 08/06/2019   GERD (gastroesophageal reflux disease) 08/06/2019   Hypercalcemia 08/06/2019   Hypertension 08/06/2019   Migraines 08/06/2019   Osteoarthritis 08/06/2019   Folate deficiency 03/03/2019   Morbid obesity with BMI of 40.0-44.9, adult (HCC) 06/05/2018   Status post right rotator cuff repair 06/05/2018   Degenerative joint disease of right shoulder 05/24/2018   Degenerative tear of glenoid labrum, right 05/24/2018   Injury of tendon of long head of right biceps 05/24/2018   Nontraumatic complete tear of right rotator cuff 05/14/2018   Rotator cuff tendinitis, right 05/14/2018   Iron  deficiency anemia 01/15/2018   B12 deficiency 09/07/2017   Microalbuminuric diabetic nephropathy (HCC) 09/07/2017   Primary osteoarthritis of right knee 02/19/2015   Restless leg syndrome 10/25/2012   Hyperlipidemia with target LDL less than 100 05/20/2012    PAIN:  Are you having pain Yes: R knee NPRS scale: 1/10 at rest, 4/10 most of the time, 9/10 at worst Pain location: anterior joint line  Pain description: sharp, stabbing, achy Aggravating factors: stairs, chores with lifting, squatting without UE support Relieving factors: weighted blanket, rubbing area, heat, Cortisone Injections since 2009 totaling 6.   PRECAUTIONS: None  WEIGHT BEARING RESTRICTIONS: No  FALLS:  Has patient fallen in last 6 months? No  LIVING ENVIRONMENT: Lives with: lives with their family Lives in: House/apartment Stairs: Yes: Internal: 12 steps; on left going up and External: 4 steps; on left going up Has following equipment at home: None  OCCUPATION: Desk  work  PLOF: Independent  PATIENT GOALS: Pain free walks with her husband (20 min.), complete daily chores, dressing without sitting, less UE support.   NEXT MD VISIT: TBD  OBJECTIVE:   DIAGNOSTIC FINDINGS: Refer to MD chart/note  PATIENT SURVEYS:  FOTO 47/53  COGNITION: Overall cognitive status: Within functional limits for tasks assessed     SENSATION: WFL  POSTURE: rounded shoulders and forward head    PALPATION: Tenderness to palpation at ant. R knee joint line.   LOWER EXTREMITY ROM:  Active ROM Right eval Left eval  Hip flexion WNL WNL  Hip extension    Hip abduction WNL WNL  Hip adduction WNL WNL  Hip internal rotation WNL WNL  Hip external rotation WNL WNL  Knee flexion 123 deg. 129 deg.  Knee extension 4 deg. 0 deg.  Ankle dorsiflexion WNL WNL  Ankle plantarflexion WNL WNL  Ankle inversion    Ankle eversion     (Blank rows = not tested)  LOWER EXTREMITY MMT:  MMT Right eval Left eval  Hip flexion 4-/5 4-/5  Hip extension    Hip abduction 4/5 4/5  Hip adduction    Hip internal rotation 4/5 4/5  Hip external rotation 4-/5 4/5  Knee flexion 5/5 5/5  Knee extension 4-/5 4/5  Ankle dorsiflexion 5/5 5/5  Ankle plantarflexion    Ankle inversion    Ankle eversion     (Blank rows = not tested)  LOWER EXTREMITY SPECIAL TESTS:  Knee special tests: Anterior drawer test: negative, McMurray's test: negative, and MCL(valgus)/LCL(Varus) + @ 20 deg. Knee flex.   FUNCTIONAL TESTS:  Timed up and go (TUG): 9.27 10 meter walk test: 8.01 Balance assessment: Single leg stance: 30 sec. On R LE, 15 sec. On L LE Standing with eyes closed for 30 sec: Completed with no LOB.  Tandem stance (alternating leading foot): completed for 30 sec.   GAIT: Distance walked: 10 m Assistive device utilized: None Level of assistance: Complete Independence Comments: Pt. Demonstrated antalgic gait of R LE with decreased stance time on the R LE. Pt. Demonstrate decreased arm  swing and decreased step cadence due to feelings of instability in R knee.    TODAY'S TREATMENT:  DATE: 01/02/2023   Subjective: Pt. Arrived to PT reporting 1/10 R knee pain. Pt. Reports feeling of giving out during housework over the weekend. Pt. States using weighted blanket to help relieve pain at night to improve sleep quality.       There Ex:  NuStep L5 10 min. B UE/LE.   Standing TKE's with BTB. Pt. Used light UE support to promote balance. Pt. Cued to isolate quad muscle. PT had pt. Step forward to decrease tension on thera-band to avoid giving away of R knee joint. 2x10 each leg.   B Standing hip flexion / abduction / ext. / hamstring curls. 2x10 reps each. Pt. Cued to have 2 sec. Hold at the top of hip flexion.   Seated LAQ. 7.5 lb. Each side. PT progressed pt. Wt. No increase in pain. 2x10 each side.           Discussed new HEP (below) and at home bowflex exercise program.                                 PATIENT EDUCATION:  Education details: Pt. Educated on PT finding regarding her R knee joint. Pt. Educated on the weaknesses found in her LE during the evaluation as well as the excess laxity within the R knee joint. Pt. Educated on PT's plan for approach at treatment. PT gave pt. Education on the benefits of PT prior to TKA of R knee while also giving her autonomy to make decisions based on insurance/finances.  Person educated: Patient Education method: Explanation, Demonstration, and Verbal cues Education comprehension: verbalized understanding  HOME EXERCISE PROGRAM: Access Code: [email or web address] URL: [email or web address] Date: 01/02/2023   Prepared by: Dorene Grebe Exercises - Sidelying Hip Abduction - 1 x daily - 4 x weekly - 3 sets - 10 reps - Prone Hip Extension - 1 x daily - 4 x weekly - 3 sets - 10 reps - Mini Squat - 1 x daily - 4 x weekly  - 3 sets - 10 reps - Seated Hip External Rotation Stretch - 1 x daily - 7 x weekly - 3 sets - 10 reps - Standing Hip Flexion March - 1 x daily - 7 x weekly - 3 sets - 10 reps - Seated Hamstring Stretch - 1 x daily - 7 x weekly - 3 sets - 10 reps   ASSESSMENT:  CLINICAL IMPRESSION: Pt. Arrived to PT reported minimal R knee pain of 1/10 on NPS. Pt. Demonstrated slight antalgic gait of R LE when walking into the clinic. Pt. Regularly demonstrates guarding of R knee joint due to feelings of giving away/instability. PT encouraged pt. To strengthen in a limited/controlled range to help prevent feelings of giving away during there ex. Activity. PT cued patient during B TKE's to isolate quad muscle to promote efficient contraction and stability of the knee joints. PT noted giving away of the R knee during TKE's and adjusted the activity by decreasing the resistance of the BTB. After adjustment of activity, pt. Experienced no other instances of R knee buckling. Pt. Had no increase in pain during today's activity. Pt. Demonstrated LOB and increased muscle fatigue in the R LE when using it as her main BOS. PT distributed a new HEP and demonstrated each exercise with pt. Giving a returned demonstration back and stating no concerns. PT discussed the overall plan moving forward following pre- and post op PT. PT determined pt. Would benefit from skilled  PT services 1x week for 2 weeks to improve pt.'s overall safety, function, and QoL until TKA can be performed.   OBJECTIVE IMPAIRMENTS: Abnormal gait, decreased activity tolerance, decreased balance, decreased coordination, decreased endurance, decreased mobility, difficulty walking, decreased strength, and pain.   ACTIVITY LIMITATIONS: carrying, lifting, squatting, sleeping, stairs, bathing, and dressing  PARTICIPATION LIMITATIONS: cleaning, laundry, shopping, community activity, and yard work  PERSONAL FACTORS: Age, Past/current experiences, and Time since onset of  injury/illness/exacerbation are also affecting patient's functional outcome.   REHAB POTENTIAL: Good  CLINICAL DECISION MAKING: Evolving/moderate complexity  EVALUATION COMPLEXITY: Moderate   GOALS: Goals reviewed with patient? Yes  SHORT TERM GOALS: Target date: 01/09/2023 Pt will be independent with HEP to improve strength and decrease knee pain to improve pain-free function and prepare for R knee TKA.  Baseline: See deficits Above. New HEP.  Goal status: INITIAL   LONG TERM GOALS: Target date: 01/23/2023  Pt. Will increase FOTO score to 53 to increase functional pain free mobility.  Baseline: 47/53 Goal status: INITIAL  2.  Pt. Will be able to walk for 20 min. With <2/10 pain on NPS With a normal gait pattern and an absence of R LE antalgic gait.  Baseline: Cannot walk more than 5 min. Without increase in pain (10/10 NPS) and demonstrates abnormal gait pattern due to pain/instability. Goal status: INITIAL  3.  Pt. Will increase overall B LE strength by a 1/2 muscle grade to increase functional mobility and decrease stress on R tibiofemoral joint.  Baseline: (See above.) Goal status: INITIAL  PLAN:  PT FREQUENCY: 1x/week  PT DURATION: 4 weeks  PLANNED INTERVENTIONS: Therapeutic exercises, Therapeutic activity, Neuromuscular re-education, Balance training, Gait training, Patient/Family education, Self Care, Joint mobilization, Joint manipulation, Stair training, Dry Needling, Electrical stimulation, Cryotherapy, Moist heat, Traction, Ultrasound, and Manual therapy  PLAN FOR NEXT SESSION: Reassess HEP  Cammie Mcgee, PT, DPT # [id number] Everlene Other, Student-PT 01/02/2023, 12:29 PM

## 2023-01-04 ENCOUNTER — Encounter: Payer: BC Managed Care – PPO | Admitting: Physical Therapy

## 2023-01-10 ENCOUNTER — Ambulatory Visit: Payer: BC Managed Care – PPO | Attending: Orthopedic Surgery | Admitting: Physical Therapy

## 2023-01-10 ENCOUNTER — Encounter: Payer: Self-pay | Admitting: Physical Therapy

## 2023-01-10 DIAGNOSIS — R29898 Other symptoms and signs involving the musculoskeletal system: Secondary | ICD-10-CM

## 2023-01-10 DIAGNOSIS — M6281 Muscle weakness (generalized): Secondary | ICD-10-CM | POA: Diagnosis present

## 2023-01-10 DIAGNOSIS — M238X1 Other internal derangements of right knee: Secondary | ICD-10-CM | POA: Diagnosis present

## 2023-01-10 DIAGNOSIS — M25612 Stiffness of left shoulder, not elsewhere classified: Secondary | ICD-10-CM

## 2023-01-10 DIAGNOSIS — M25512 Pain in left shoulder: Secondary | ICD-10-CM | POA: Diagnosis present

## 2023-01-10 DIAGNOSIS — M25561 Pain in right knee: Secondary | ICD-10-CM | POA: Insufficient documentation

## 2023-01-10 DIAGNOSIS — R2689 Other abnormalities of gait and mobility: Secondary | ICD-10-CM | POA: Diagnosis present

## 2023-01-10 DIAGNOSIS — G8929 Other chronic pain: Secondary | ICD-10-CM

## 2023-01-10 NOTE — Therapy (Signed)
OUTPATIENT PHYSICAL THERAPY LOWER EXTREMITY TREATMENT   Patient Name: Andrea Zhang MRN: [id number] DOB:[birth year], 62 y.o., female Today's Date: 01/10/2023  END OF SESSION:  PT End of Session - 01/10/23 0817     Visit Number 3    Number of Visits 5    Date for PT Re-Evaluation 01/23/23    PT Start Time 0814    PT Stop Time 0903    PT Time Calculation (min) 49 min    Activity Tolerance Patient tolerated treatment well;No increased pain    Behavior During Therapy WFL for tasks assessed/performed             Past Medical History:  Diagnosis Date   Anemia    iron deficiency and b12 deficiency   Anxiety    Arthritis    Asthma    Diabetes mellitus without complication (HCC)    Fasciitis    left foot   GERD (gastroesophageal reflux disease)    History of kidney stones    Hypercholesterolemia    Hypertension    Migraines    MIGRAINES HAVE IMPROVED SINCE RECEIVING IRON   Past Surgical History:  Procedure Laterality Date   CHOLECYSTECTOMY  2004   COLONOSCOPY WITH ESOPHAGOGASTRODUODENOSCOPY (EGD)  02/2018   ESOPHAGOGASTRODUODENOSCOPY N/A 10/16/2020   Procedure: ESOPHAGOGASTRODUODENOSCOPY (EGD);  Surgeon: Regis Bill, MD;  Location: [facility] ENDOSCOPY;  Service: Endoscopy;  Laterality: N/A;   ESOPHAGOGASTRODUODENOSCOPY (EGD) WITH PROPOFOL N/A 06/06/2016   Procedure: ESOPHAGOGASTRODUODENOSCOPY (EGD) WITH PROPOFOL;  Surgeon: Scot Jun, MD;  Location: [facility] ENDOSCOPY;  Service: Endoscopy;  Laterality: N/A;   EXTRACORPOREAL SHOCK WAVE LITHOTRIPSY  2010   JOINT REPLACEMENT  2013   LT TKR   SAVORY DILATION N/A 06/06/2016   Procedure: SAVORY DILATION;  Surgeon: Scot Jun, MD;  Location: [facility] ENDOSCOPY;  Service: Endoscopy;  Laterality: N/A;   SAVORY DILATION  02/2018   SHOULDER ARTHROSCOPY WITH OPEN ROTATOR CUFF REPAIR Right 05/24/2018   Procedure: right shoulder arthroscopy, extensive arthroscopic debridement, decompression, open rotator cuff repair, biceps  tenodesis;  Surgeon: Christena Flake, MD;  Location: [facility] ORS;  Service: Orthopedics;  Laterality: Right;   Patient Active Problem List   Diagnosis Date Noted   Coronary artery calcification 09/09/2019   Aortic atherosclerosis (HCC) 09/09/2019   Allergic rhinitis 08/06/2019   Asthma 08/06/2019   Diabetes mellitus, type 2 (HCC) 08/06/2019   Dysphagia 08/06/2019   GERD (gastroesophageal reflux disease) 08/06/2019   Hypercalcemia 08/06/2019   Hypertension 08/06/2019   Migraines 08/06/2019   Osteoarthritis 08/06/2019   Folate deficiency 03/03/2019   Morbid obesity with BMI of 40.0-44.9, adult (HCC) 06/05/2018   Status post right rotator cuff repair 06/05/2018   Degenerative joint disease of right shoulder 05/24/2018   Degenerative tear of glenoid labrum, right 05/24/2018   Injury of tendon of long head of right biceps 05/24/2018   Nontraumatic complete tear of right rotator cuff 05/14/2018   Rotator cuff tendinitis, right 05/14/2018   Iron deficiency anemia 01/15/2018   B12 deficiency 09/07/2017   Microalbuminuric diabetic nephropathy (HCC) 09/07/2017   Primary osteoarthritis of right knee 02/19/2015   Restless leg syndrome 10/25/2012   Hyperlipidemia with target LDL less than 100 05/20/2012    PCP: Dalia Heading, MD  REFERRING PROVIDER: Dedra Skeens, PA-C   REFERRING DIAG: Unilateral primary osteoarthritis, right knee  THERAPY DIAG:  Chronic pain of right knee  Weakness of both lower extremities  Other abnormalities of gait and mobility  Laxity of knee joint, right  Chronic left shoulder  pain  Shoulder joint stiffness, left  Muscle weakness (generalized)  Stiffness of left shoulder joint  Rationale for Evaluation and Treatment: Rehabilitation  ONSET DATE: Issues since 1997 after a fall.   SUBJECTIVE:   SUBJECTIVE STATEMENT: Pt. States B knee pain since 1997. Pt. States a slight ache (1/10 pain on NPS) in L knee occasionally and tightness above and below it.  Pt. States her main concern is her R knee and that she has a significant amount of pain with all activities especially stairs and single leg stance weight bearing. The pain keeps the pt. Awake at night about once a week when the pain is at its worst.   PERTINENT HISTORY: Pt. Has had a L TKA and is preparing for R knee TKA. Pt. Had a fall in 1997 that caused B knee pain. The fall was due to taking heavy migraine medication that caused her to sleep walk down the stairs. Pt. Had first TKA in L knee in 2013. Pt. Has had a L knee partial menisectomy. Pt. Has significant hx. Of Iron deficiency that is now controlled.  Past Medical History:  Diagnosis Date   Anemia      iron deficiency and b12 deficiency   Anxiety     Arthritis     Asthma     Diabetes mellitus without complication (HCC)     Fasciitis      left foot   GERD (gastroesophageal reflux disease)     History of kidney stones     Hypercholesterolemia     Hypertension     Migraines      MIGRAINES HAVE IMPROVED SINCE RECEIVING IRON         Past Surgical History:  Procedure Laterality Date   CHOLECYSTECTOMY   2004   COLONOSCOPY WITH ESOPHAGOGASTRODUODENOSCOPY (EGD)   02/2018   ESOPHAGOGASTRODUODENOSCOPY N/A 10/16/2020    Procedure: ESOPHAGOGASTRODUODENOSCOPY (EGD);  Surgeon: Regis Bill, MD;  Location: [facility] ENDOSCOPY;  Service: Endoscopy;  Laterality: N/A;   ESOPHAGOGASTRODUODENOSCOPY (EGD) WITH PROPOFOL N/A 06/06/2016    Procedure: ESOPHAGOGASTRODUODENOSCOPY (EGD) WITH PROPOFOL;  Surgeon: Scot Jun, MD;  Location: [facility] ENDOSCOPY;  Service: Endoscopy;  Laterality: N/A;   EXTRACORPOREAL SHOCK WAVE LITHOTRIPSY   2010   JOINT REPLACEMENT   2013    LT TKR   SAVORY DILATION N/A 06/06/2016    Procedure: SAVORY DILATION;  Surgeon: Scot Jun, MD;  Location: [facility] ENDOSCOPY;  Service: Endoscopy;  Laterality: N/A;   SAVORY DILATION   02/2018   SHOULDER ARTHROSCOPY WITH OPEN ROTATOR CUFF REPAIR Right 05/24/2018    Procedure:  right shoulder arthroscopy, extensive arthroscopic debridement, decompression, open rotator cuff repair, biceps tenodesis;  Surgeon: Christena Flake, MD;  Location: [facility] ORS;  Service: Orthopedics;  Laterality: Right;        Patient Active Problem List    Diagnosis Date Noted   Coronary artery calcification 09/09/2019   Aortic atherosclerosis (HCC) 09/09/2019   Allergic rhinitis 08/06/2019   Asthma 08/06/2019   Diabetes mellitus, type 2 (HCC) 08/06/2019   Dysphagia 08/06/2019   GERD (gastroesophageal reflux disease) 08/06/2019   Hypercalcemia 08/06/2019   Hypertension 08/06/2019   Migraines 08/06/2019   Osteoarthritis 08/06/2019   Folate deficiency 03/03/2019   Morbid obesity with BMI of 40.0-44.9, adult (HCC) 06/05/2018   Status post right rotator cuff repair 06/05/2018   Degenerative joint disease of right shoulder 05/24/2018   Degenerative tear of glenoid labrum, right 05/24/2018   Injury of tendon of long head of right biceps 05/24/2018  Nontraumatic complete tear of right rotator cuff 05/14/2018   Rotator cuff tendinitis, right 05/14/2018   Iron deficiency anemia 01/15/2018   B12 deficiency 09/07/2017   Microalbuminuric diabetic nephropathy (HCC) 09/07/2017   Primary osteoarthritis of right knee 02/19/2015   Restless leg syndrome 10/25/2012   Hyperlipidemia with target LDL less than 100 05/20/2012    PAIN:  Are you having pain Yes: R knee NPRS scale: 1/10 at rest, 4/10 most of the time, 9/10 at worst Pain location: anterior joint line  Pain description: sharp, stabbing, achy Aggravating factors: stairs, chores with lifting, squatting without UE support Relieving factors: weighted blanket, rubbing area, heat, Cortisone Injections since 2009 totaling 6.   PRECAUTIONS: None  WEIGHT BEARING RESTRICTIONS: No  FALLS:  Has patient fallen in last 6 months? No  LIVING ENVIRONMENT: Lives with: lives with their family Lives in: House/apartment Stairs: Yes: Internal: 12  steps; on left going up and External: 4 steps; on left going up Has following equipment at home: None  OCCUPATION: Desk work  PLOF: Independent  PATIENT GOALS: Pain free walks with her husband (20 min.), complete daily chores, dressing without sitting, less UE support.   NEXT MD VISIT: TBD  OBJECTIVE:   DIAGNOSTIC FINDINGS: Refer to MD chart/note  PATIENT SURVEYS:  FOTO 47/53 .  3/5: 86  COGNITION: Overall cognitive status: Within functional limits for tasks assessed     SENSATION: WFL  POSTURE: rounded shoulders and forward head    PALPATION: Tenderness to palpation at ant. R knee joint line.   LOWER EXTREMITY ROM:  Active ROM Right eval Left eval  Hip flexion WNL WNL  Hip extension    Hip abduction WNL WNL  Hip adduction WNL WNL  Hip internal rotation WNL WNL  Hip external rotation WNL WNL  Knee flexion 123 deg. 129 deg.  Knee extension 4 deg. 0 deg.  Ankle dorsiflexion WNL WNL  Ankle plantarflexion WNL WNL  Ankle inversion    Ankle eversion     (Blank rows = not tested)  LOWER EXTREMITY MMT:  MMT Right eval Left eval  Hip flexion 4-/5 4-/5  Hip extension    Hip abduction 4/5 4/5  Hip adduction    Hip internal rotation 4/5 4/5  Hip external rotation 4-/5 4/5  Knee flexion 5/5 5/5  Knee extension 4-/5 4/5  Ankle dorsiflexion 5/5 5/5  Ankle plantarflexion    Ankle inversion    Ankle eversion     (Blank rows = not tested)  LOWER EXTREMITY SPECIAL TESTS:  Knee special tests: Anterior drawer test: negative, McMurray's test: negative, and MCL(valgus)/LCL(Varus) + @ 20 deg. Knee flex.   FUNCTIONAL TESTS:  Timed up and go (TUG): 9.27 10 meter walk test: 8.01 Balance assessment: Single leg stance: 30 sec. On R LE, 15 sec. On L LE Standing with eyes closed for 30 sec: Completed with no LOB.  Tandem stance (alternating leading foot): completed for 30 sec.   GAIT: Distance walked: 10 m Assistive device utilized: None Level of assistance: Complete  Independence Comments: Pt. Demonstrated antalgic gait of R LE with decreased stance time on the R LE. Pt. Demonstrate decreased arm swing and decreased step cadence due to feelings of instability in R knee.    TODAY'S TREATMENT:  DATE: 01/10/2023   Subjective: Pt. Arrived to PT reporting 3/10 L shoulder discomfort with forward reaching and 1/10 R knee pain. Pt. Had an increase in L plantar fasciitis symptoms over weekend but better today.  Pt. Planning to contact Dr. Binnie Rail office to discuss POC/ scheduling.    There Ex:  NuStep L5 10 min. B UE/LE.   3# LE ex.: seated LAQ (increase hold time/ eccentric focus)/ standing B hip flexion / abduction / ext. / hamstring curls 20x each. Pt. Cued to have 2 sec. Hold at the top of hip flexion.   Alt. UE/LE touches in //-bars with focus on SLS/ knee control.    Walking in clinic (gait reassessment).  Ascending stairs with recip. Pattern/ step to gait while descending stairs with no UE assist.    Reviewed HEP (below) and at home bowflex exercise program.                                 PATIENT EDUCATION:  Education details: Pt. Educated on PT finding regarding her R knee joint. Pt. Educated on the weaknesses found in her LE during the evaluation as well as the excess laxity within the R knee joint. Pt. Educated on PT's plan for approach at treatment. PT gave pt. Education on the benefits of PT prior to TKA of R knee while also giving her autonomy to make decisions based on insurance/finances.  Person educated: Patient Education method: Explanation, Demonstration, and Verbal cues Education comprehension: verbalized understanding  HOME EXERCISE PROGRAM: Access Code: [email or web address] URL: [email or web address] Date: 01/02/2023   Prepared by: Dorene Grebe Exercises - Sidelying Hip Abduction - 1 x daily - 4 x weekly - 3 sets -  10 reps - Prone Hip Extension - 1 x daily - 4 x weekly - 3 sets - 10 reps - Mini Squat - 1 x daily - 4 x weekly - 3 sets - 10 reps - Seated Hip External Rotation Stretch - 1 x daily - 7 x weekly - 3 sets - 10 reps - Standing Hip Flexion March - 1 x daily - 7 x weekly - 3 sets - 10 reps - Seated Hamstring Stretch - 1 x daily - 7 x weekly - 3 sets - 10 reps   ASSESSMENT:  CLINICAL IMPRESSION: Pt. Arrived to PT  and reported minimal R knee pain of 1/10 on NPS. Pt. Demonstrated slight antalgic gait of R LE when walking into the clinic. Pt. Regularly demonstrates guarding of R knee joint due to feelings of giving away/instability. Discharge to HEP at this time due to limit in PT visits/ calendar year.  Pt. Understands HEP and instructed to contact PT with questions. Pt. Will f/u with Dr. Joice Lofts and contact PT if scheduled for TKA.     OBJECTIVE IMPAIRMENTS: Abnormal gait, decreased activity tolerance, decreased balance, decreased coordination, decreased endurance, decreased mobility, difficulty walking, decreased strength, and pain.   ACTIVITY LIMITATIONS: carrying, lifting, squatting, sleeping, stairs, bathing, and dressing  PARTICIPATION LIMITATIONS: cleaning, laundry, shopping, community activity, and yard work  PERSONAL FACTORS: Age, Past/current experiences, and Time since onset of injury/illness/exacerbation are also affecting patient's functional outcome.   REHAB POTENTIAL: Good  CLINICAL DECISION MAKING: Evolving/moderate complexity  EVALUATION COMPLEXITY: Moderate   GOALS: Goals reviewed with patient? Yes  SHORT TERM GOALS: Target date: 01/09/2023 Pt will be independent with HEP to improve strength and decrease knee pain to improve pain-free function and prepare for R knee  TKA.  Baseline: See deficits Above. New HEP.  Goal status: Goal met   LONG TERM GOALS: Target date: 01/23/2023  Pt. Will increase FOTO score to 53 to increase functional pain free mobility.  Baseline: 47/53.   3/5: 63 Goal status: Goal met  2.  Pt. Will be able to walk for 20 min. With <2/10 pain on NPS With a normal gait pattern and an absence of R LE antalgic gait.  Baseline: Cannot walk more than 5 min. Without increase in pain (10/10 NPS) and demonstrates abnormal gait pattern due to pain/instability.  3/5: slight R antalgic gait with increase distance walked.  Goal status: Partially met  3.  Pt. Will increase overall B LE strength by a 1/2 muscle grade to increase functional mobility and decrease stress on R tibiofemoral joint.  Baseline: (See above.) Goal status: Ongoing  PLAN:  PT FREQUENCY: 1x/week  PT DURATION: 4 weeks  PLANNED INTERVENTIONS: Therapeutic exercises, Therapeutic activity, Neuromuscular re-education, Balance training, Gait training, Patient/Family education, Self Care, Joint mobilization, Joint manipulation, Stair training, Dry Needling, Electrical stimulation, Cryotherapy, Moist heat, Traction, Ultrasound, and Manual therapy  PLAN FOR NEXT SESSION: Discharge  Cammie Mcgee, PT, DPT # [phone] 01/10/2023, 9:25 AM

## 2023-01-18 ENCOUNTER — Ambulatory Visit: Payer: BC Managed Care – PPO

## 2023-01-18 ENCOUNTER — Other Ambulatory Visit: Payer: Self-pay | Admitting: Physician Assistant

## 2023-01-18 DIAGNOSIS — G43009 Migraine without aura, not intractable, without status migrainosus: Secondary | ICD-10-CM

## 2023-02-13 ENCOUNTER — Ambulatory Visit
Admission: RE | Admit: 2023-02-13 | Discharge: 2023-02-13 | Disposition: A | Discharge status: Home / Self Care | Payer: BC Managed Care – PPO | Source: Ambulatory Visit | Attending: Physician Assistant | Admitting: Physician Assistant

## 2023-02-13 DIAGNOSIS — G43009 Migraine without aura, not intractable, without status migrainosus: Secondary | ICD-10-CM

## 2023-03-23 ENCOUNTER — Other Ambulatory Visit: Payer: Self-pay | Admitting: *Deleted

## 2023-03-23 DIAGNOSIS — D509 Iron deficiency anemia, unspecified: Secondary | ICD-10-CM

## 2023-03-24 ENCOUNTER — Inpatient Hospital Stay: Payer: BC Managed Care – PPO | Attending: Oncology

## 2023-03-24 ENCOUNTER — Encounter: Payer: Self-pay | Admitting: Oncology

## 2023-03-24 ENCOUNTER — Other Ambulatory Visit: Payer: BC Managed Care – PPO

## 2023-03-24 ENCOUNTER — Inpatient Hospital Stay (HOSPITAL_BASED_OUTPATIENT_CLINIC_OR_DEPARTMENT_OTHER): Payer: BC Managed Care – PPO | Admitting: Oncology

## 2023-03-24 VITALS — BP 99/80 | HR 84 | Temp 97.6°F | Resp 19 | Wt 189.1 lb

## 2023-03-24 DIAGNOSIS — Z820 Family history of epilepsy and other diseases of the nervous system: Secondary | ICD-10-CM | POA: Diagnosis not present

## 2023-03-24 DIAGNOSIS — M2550 Pain in unspecified joint: Secondary | ICD-10-CM | POA: Insufficient documentation

## 2023-03-24 DIAGNOSIS — Z8042 Family history of malignant neoplasm of prostate: Secondary | ICD-10-CM | POA: Diagnosis not present

## 2023-03-24 DIAGNOSIS — Z79899 Other long term (current) drug therapy: Secondary | ICD-10-CM | POA: Insufficient documentation

## 2023-03-24 DIAGNOSIS — E785 Hyperlipidemia, unspecified: Secondary | ICD-10-CM | POA: Insufficient documentation

## 2023-03-24 DIAGNOSIS — Z832 Family history of diseases of the blood and blood-forming organs and certain disorders involving the immune mechanism: Secondary | ICD-10-CM | POA: Insufficient documentation

## 2023-03-24 DIAGNOSIS — J45909 Unspecified asthma, uncomplicated: Secondary | ICD-10-CM | POA: Diagnosis not present

## 2023-03-24 DIAGNOSIS — Z91041 Radiographic dye allergy status: Secondary | ICD-10-CM | POA: Insufficient documentation

## 2023-03-24 DIAGNOSIS — Z811 Family history of alcohol abuse and dependence: Secondary | ICD-10-CM | POA: Insufficient documentation

## 2023-03-24 DIAGNOSIS — Z825 Family history of asthma and other chronic lower respiratory diseases: Secondary | ICD-10-CM | POA: Diagnosis not present

## 2023-03-24 DIAGNOSIS — Z886 Allergy status to analgesic agent status: Secondary | ICD-10-CM | POA: Diagnosis not present

## 2023-03-24 DIAGNOSIS — Z8379 Family history of other diseases of the digestive system: Secondary | ICD-10-CM | POA: Insufficient documentation

## 2023-03-24 DIAGNOSIS — Z87442 Personal history of urinary calculi: Secondary | ICD-10-CM | POA: Insufficient documentation

## 2023-03-24 DIAGNOSIS — Z888 Allergy status to other drugs, medicaments and biological substances status: Secondary | ICD-10-CM | POA: Insufficient documentation

## 2023-03-24 DIAGNOSIS — I10 Essential (primary) hypertension: Secondary | ICD-10-CM | POA: Insufficient documentation

## 2023-03-24 DIAGNOSIS — Z823 Family history of stroke: Secondary | ICD-10-CM | POA: Diagnosis not present

## 2023-03-24 DIAGNOSIS — Z818 Family history of other mental and behavioral disorders: Secondary | ICD-10-CM | POA: Insufficient documentation

## 2023-03-24 DIAGNOSIS — Z881 Allergy status to other antibiotic agents status: Secondary | ICD-10-CM | POA: Diagnosis not present

## 2023-03-24 DIAGNOSIS — D509 Iron deficiency anemia, unspecified: Secondary | ICD-10-CM

## 2023-03-24 DIAGNOSIS — E119 Type 2 diabetes mellitus without complications: Secondary | ICD-10-CM | POA: Insufficient documentation

## 2023-03-24 DIAGNOSIS — Z9049 Acquired absence of other specified parts of digestive tract: Secondary | ICD-10-CM | POA: Diagnosis not present

## 2023-03-24 DIAGNOSIS — Z8249 Family history of ischemic heart disease and other diseases of the circulatory system: Secondary | ICD-10-CM | POA: Insufficient documentation

## 2023-03-24 DIAGNOSIS — M1990 Unspecified osteoarthritis, unspecified site: Secondary | ICD-10-CM | POA: Diagnosis not present

## 2023-03-24 LAB — CBC WITH DIFFERENTIAL (CANCER CENTER ONLY)
Abs Immature Granulocytes: 0.04 10*3/uL (ref 0.00–0.07)
Basophils Absolute: 0 10*3/uL (ref 0.0–0.1)
Basophils Relative: 1 %
Eosinophils Absolute: 0.3 10*3/uL (ref 0.0–0.5)
Eosinophils Relative: 3 %
HCT: 44.5 % (ref 36.0–46.0)
Hemoglobin: 14.2 g/dL (ref 12.0–15.0)
Immature Granulocytes: 1 %
Lymphocytes Relative: 19 %
Lymphs Abs: 1.7 10*3/uL (ref 0.7–4.0)
MCH: 28.6 pg (ref 26.0–34.0)
MCHC: 31.9 g/dL (ref 30.0–36.0)
MCV: 89.7 fL (ref 80.0–100.0)
Monocytes Absolute: 0.8 10*3/uL (ref 0.1–1.0)
Monocytes Relative: 9 %
Neutro Abs: 5.9 10*3/uL (ref 1.7–7.7)
Neutrophils Relative %: 67 %
Platelet Count: 349 10*3/uL (ref 150–400)
RBC: 4.96 MIL/uL (ref 3.87–5.11)
RDW: 13.8 % (ref 11.5–15.5)
WBC Count: 8.7 10*3/uL (ref 4.0–10.5)
nRBC: 0 % (ref 0.0–0.2)

## 2023-03-24 LAB — IRON AND TIBC
Iron: 46 ug/dL (ref 28–170)
Saturation Ratios: 13 % (ref 10.4–31.8)
TIBC: 343 ug/dL (ref 250–450)
UIBC: 297 ug/dL

## 2023-03-24 LAB — FERRITIN: Ferritin: 53 ng/mL (ref 11–307)

## 2023-03-24 NOTE — Progress Notes (Signed)
Hematology/Oncology Consult note [facility]  [phone] [phone]  Patient Care Team: Dalia Heading, MD as PCP - General (Cardiology) Stanton Kidney, MD as Consulting Physician (Gastroenterology) Creig Hines, MD as Consulting Physician (Hematology and Oncology) Dalia Heading, MD as Consulting Physician (Cardiology) Creig Hines, MD as Consulting Physician (Hematology and Oncology)   Name of the patient: Andrea Zhang  [id number]  1961-03-15   Date of visit: 03/24/23  Diagnosis-iron deficiency anemia  Chief complaint/ Reason for visit-routine follow-up of iron deficiency anemia  Heme/Onc history: patient is a 62 year old female with a past medical history significant for hypertension hyperlipidemia, asthma diabetes osteoarthritis among other medical problems.  She also has chronic arthritis for which she sees Dr. Gavin Potters and has been getting joint injections.  She was seen by her PCP on 01/12/2018 with symptoms of lightheadedness and worsening shortness of breath.  She has been earlier seen by Dr. Lady Gary from cardiology on 12/19/2017 and underwent stress test and echocardiogram that was normal.  Blood work done on 01/12/2018 was as follows: CBC showed white count of 8.4, H&H of 6.7/22.6 with an MCV of 81 and a platelet count of 324.  Differential on the CBC was normal.  Iron study showed TIBC that was elevated at 546.  Percentage iron saturation was 36.  Ferritin levels were not checked.  CMP and TSH were within normal.  B12 levels in November 2018 were low at 241.  Upper endoscopy from July 27 showed a Schatzki's ring which was dilated and evidence of gastritis.  Duodenum was normal.  She has had 2-3 EGDs in the past for strictures requiring dilatation.  She has had colonoscopy back in 2011 which was apparently normal.   Results of blood work from 01/15/2018 were as follows: CBC showed white count of 7.4, H&H of 7.5/24 with an MCV of  78.2 and a platelet count of 299.  Ferritin levels were low at 7.  Iron studies showed a low iron saturation and elevated TIBC of 560 haptoglobin was normal, celiac disease panel was negative, myeloma panel revealed no monoclonal protein.  B12 level was low low at 210.  Reticulocyte count was elevated at 7.5 indicating response to anemia.  Folate level was normal at 7.6.  Urinalysis did not reveal any hematuria.  Stool H. pylori antigen was negative   Patient was seen by [facility] and underwent EGD and colonoscopy in May 2019 which was apparently unremarkable.  Patient has not had a capsule endoscopy done yet.  Patient has required multiple IV iron infusions in the past but none since April 2023  Interval history-overall she feels well and other than chronic joint pain she denies any specific complaints at this time.  She will be undergoing right knee surgery for chronic joint pain sometime in October 2024  ECOG PS- 1 Pain scale- 3   Review of systems- Review of Systems  Constitutional:  Negative for chills, fever, malaise/fatigue and weight loss.  HENT:  Negative for congestion, ear discharge and nosebleeds.   Eyes:  Negative for blurred vision.  Respiratory:  Negative for cough, hemoptysis, sputum production, shortness of breath and wheezing.   Cardiovascular:  Negative for chest pain, palpitations, orthopnea and claudication.  Gastrointestinal:  Negative for abdominal pain, blood in stool, constipation, diarrhea, heartburn, melena, nausea and vomiting.  Genitourinary:  Negative for dysuria, flank pain, frequency, hematuria and urgency.  Musculoskeletal:  Positive for joint pain. Negative for back pain and  myalgias.  Skin:  Negative for rash.  Neurological:  Negative for dizziness, tingling, focal weakness, seizures, weakness and headaches.  Endo/Heme/Allergies:  Does not bruise/bleed easily.  Psychiatric/Behavioral:  Negative for depression and suicidal ideas. The patient does not  have insomnia.       Allergies  Allergen Reactions   Iodinated Contrast Media Other (See Comments)    Becomes 'unresponsive' to ORAL and IV DYE BETADINE ON THE SKIN IS OKAY unresponsive Patient could hear things but not responsive Becomes 'unresponsive' to ORAL and IV DYE BETADINE ON THE SKIN IS OKAY  Patient could hear things but not responsive Becomes 'unresponsive' to ORAL and IV DYE BETADINE ON THE SKIN IS OKAY Becomes 'unresponsive' to ORAL and IV DYE BETADINE ON THE SKIN IS OKAY unresponsive Patient could hear things but not responsiveBecomes 'unresponsive' to ORAL and IV DYE BETADINE ON THE SKIN IS OKAY   Iodine     Other reaction(s): Other (See Comments) Becomes 'unresponsive' to ORAL and IV DYE BETADINE ON THE SKIN IS OKAY unresponsive Patient could hear things but not responsive Becomes 'unresponsive' to ORAL and IV DYE BETADINE ON THE SKIN IS OKAY  Patient could hear things but not responsive Becomes 'unresponsive' to ORAL and IV DYE BETADINE ON THE SKIN IS OKAY Becomes 'unresponsive' to ORAL and IV DYE BETADINE ON THE SKIN IS OKAY unresponsive Patient could hear things but not responsiveBecomes 'unresponsive' to ORAL and IV DYE BETADINE ON THE SKIN IS OKAY   Diclofenac Hives    HORRIBLE RASH with both PATCH OR CREAM   Ibuprofen Itching   Zyrtec [Cetirizine] Other (See Comments)    HEADACHE    Cephalexin Rash   Maxalt [Rizatriptan Benzoate] Other (See Comments)    'Heart Races'   Orphenadrine Citrate Other (See Comments)    Patient unsure of this allergy.   Zithromax [Azithromycin] Other (See Comments)    Severe Abdominal Cramps   Zomig [Zolmitriptan] Other (See Comments)    'Heart Races'     Past Medical History:  Diagnosis Date   Anemia    iron deficiency and b12 deficiency   Anxiety    Arthritis    Asthma    Diabetes mellitus without complication (HCC)    Fasciitis    left foot   GERD (gastroesophageal reflux disease)    History of  kidney stones    Hypercholesterolemia    Hypertension    Migraines    MIGRAINES HAVE IMPROVED SINCE RECEIVING IRON     Past Surgical History:  Procedure Laterality Date   CHOLECYSTECTOMY  2004   COLONOSCOPY WITH ESOPHAGOGASTRODUODENOSCOPY (EGD)  02/2018   ESOPHAGOGASTRODUODENOSCOPY N/A 10/16/2020   Procedure: ESOPHAGOGASTRODUODENOSCOPY (EGD);  Surgeon: Regis Bill, MD;  Location: [facility] ENDOSCOPY;  Service: Endoscopy;  Laterality: N/A;   ESOPHAGOGASTRODUODENOSCOPY (EGD) WITH PROPOFOL N/A 06/06/2016   Procedure: ESOPHAGOGASTRODUODENOSCOPY (EGD) WITH PROPOFOL;  Surgeon: Scot Jun, MD;  Location: [facility] ENDOSCOPY;  Service: Endoscopy;  Laterality: N/A;   EXTRACORPOREAL SHOCK WAVE LITHOTRIPSY  2010   JOINT REPLACEMENT  2013   LT TKR   SAVORY DILATION N/A 06/06/2016   Procedure: SAVORY DILATION;  Surgeon: Scot Jun, MD;  Location: [facility] ENDOSCOPY;  Service: Endoscopy;  Laterality: N/A;   SAVORY DILATION  02/2018   SHOULDER ARTHROSCOPY WITH OPEN ROTATOR CUFF REPAIR Right 05/24/2018   Procedure: right shoulder arthroscopy, extensive arthroscopic debridement, decompression, open rotator cuff repair, biceps tenodesis;  Surgeon: Christena Flake, MD;  Location: [facility] ORS;  Service: Orthopedics;  Laterality: Right;  Social History   Socioeconomic History   Marital status: Married    Spouse name: Not on file   Number of children: Not on file   Years of education: Not on file   Highest education level: Not on file  Occupational History   Not on file  Tobacco Use   Smoking status: Never   Smokeless tobacco: Never  Vaping Use   Vaping Use: Never used  Substance and Sexual Activity   Alcohol use: No   Drug use: No   Sexual activity: Not Currently  Other Topics Concern   Not on file  Social History Narrative   Not on file   Social Determinants of Health   Financial Resource Strain: Not on file  Food Insecurity: Not on file  Transportation Needs: Not on file  Physical  Activity: Not on file  Stress: Not on file  Social Connections: Not on file  Intimate Partner Violence: Not on file    Family History  Problem Relation Age of Onset   COPD Mother    Heart disease Mother    Asthma Mother    Schizophrenia Mother    Hemophilia Father    Prostate cancer Father    Heart disease Father    Epilepsy Sister    Stroke Sister    Alcohol abuse Sister    Hypertension Sister    Epilepsy Brother    Lupus Daughter    Gallbladder disease Daughter    Breast cancer Neg Hx      Current Outpatient Medications:    albuterol (PROVENTIL HFA;VENTOLIN HFA) 108 (90 Base) MCG/ACT inhaler, Inhale 2 puffs into the lungs every 6 (six) hours as needed for wheezing or shortness of breath., Disp: , Rfl:    aspirin EC 81 MG tablet, Take 81 mg by mouth daily., Disp: , Rfl:    atenolol (TENORMIN) 50 MG tablet, Take 50 mg by mouth daily., Disp: , Rfl:    Cholecalciferol 2000 units CAPS, Take 2,000 Units by mouth daily., Disp: , Rfl:    cyclobenzaprine (FLEXERIL) 5 MG tablet, Take 5 mg by mouth 3 (three) times daily as needed for muscle spasms., Disp: , Rfl:    diazepam (VALIUM) 2 MG tablet, Take 2-4mg  30-60 minutes prior to procedure. Can take additional 2mg  tablet immediately prior to procedure as needed., Disp: , Rfl:    EMGALITY 120 MG/ML SOAJ, Inject into the skin., Disp: , Rfl:    gabapentin (NEURONTIN) 800 MG tablet, Take by mouth., Disp: , Rfl:    meloxicam (MOBIC) 15 MG tablet, Take 15 mg by mouth daily., Disp: , Rfl:    mirtazapine (REMERON) 15 MG tablet, Take 1 tablet by mouth at bedtime., Disp: , Rfl:    Multiple Vitamin (MULTI-VITAMIN) tablet, Take 1 tablet by mouth daily., Disp: , Rfl:    omeprazole (PRILOSEC) 40 MG capsule, Take 40 mg by mouth 2 (two) times daily., Disp: , Rfl:    OZEMPIC, 0.25 OR 0.5 MG/DOSE, 2 MG/3ML SOPN, Inject into the skin., Disp: , Rfl:    PREVIDENT 5000 BOOSTER PLUS 1.1 % PSTE, Place onto teeth., Disp: , Rfl:    promethazine (PHENERGAN) 25  MG tablet, Take by mouth., Disp: , Rfl:    simvastatin (ZOCOR) 10 MG tablet, Take 1 tablet by mouth at bedtime., Disp: , Rfl:    vitamin B-12 (CYANOCOBALAMIN) 1000 MCG tablet, Take 1,000 mcg by mouth daily., Disp: , Rfl:    cetirizine (ZYRTEC) 10 MG tablet, Take 10 mg by mouth as needed for  allergies. (Patient not taking: Reported on 05/19/2021), Disp: , Rfl:    hydrochlorothiazide (HYDRODIURIL) 12.5 MG tablet, Take 12.5 mg by mouth as needed (fluid). (Patient not taking: Reported on 10/16/2020), Disp: , Rfl:    naratriptan (AMERGE) 2.5 MG tablet, Take 2.5 mg by mouth as needed for migraine. Take one (1) tablet at onset of headache; if returns or does not resolve, may repeat after 4 hours; do not exceed five (5) mg in 24 hours., Disp: , Rfl:    pioglitazone-metformin (ACTOPLUS MET) 15-850 MG tablet, Take 1 tablet by mouth 2 (two) times daily with a meal. (Patient not taking: Reported on 09/23/2022), Disp: , Rfl:    SUMAtriptan (IMITREX) 100 MG tablet, Take by mouth. (Patient not taking: Reported on 03/24/2023), Disp: , Rfl:   Physical exam:  Vitals:   03/24/23 1414  BP: 99/80  Pulse: 84  Resp: 19  Temp: 97.6 F (36.4 C)  SpO2: 98%  Weight: 189 lb 1.6 oz (85.8 kg)   Physical Exam Cardiovascular:     Rate and Rhythm: Normal rate and regular rhythm.     Heart sounds: Normal heart sounds.  Pulmonary:     Effort: Pulmonary effort is normal.  Skin:    General: Skin is warm and dry.  Neurological:     Mental Status: She is alert and oriented to person, place, and time.         Latest Ref Rng & Units 08/21/2019    8:10 PM  CMP  Glucose 70 - 99 mg/dL 440   BUN 6 - 20 mg/dL 14   Creatinine 1.02 - 1.00 mg/dL 7.25   Sodium 366 - 440 mmol/L 139   Potassium 3.5 - 5.1 mmol/L 3.6   Chloride 98 - 111 mmol/L 101   CO2 22 - 32 mmol/L 29   Calcium 8.9 - 10.3 mg/dL 34.7   Total Protein 6.5 - 8.1 g/dL 7.4   Total Bilirubin 0.3 - 1.2 mg/dL 0.8   Alkaline Phos 38 - 126 U/L 77   AST 15 - 41 U/L  16   ALT 0 - 44 U/L 18       Latest Ref Rng & Units 03/24/2023    1:56 PM  CBC  WBC 4.0 - 10.5 K/uL 8.7   Hemoglobin 12.0 - 15.0 g/dL 42.5   Hematocrit 95.6 - 46.0 % 44.5   Platelets 150 - 400 K/uL 349      Assessment and plan- Patient is a 62 y.o. female here for routine follow-up of iron deficiency anemia  Patient is not presently anemic with a hemoglobin of 14.2.  Iron studies from today are pending.  She has not required IV iron for over a year now.  I will repeat CBC ferritin and iron studies in 4 and 8 months and see her back in 8 months   Visit Diagnosis 1. Iron deficiency anemia, unspecified iron deficiency anemia type      Dr. Owens Shark, MD, MPH [facility] [phone] 03/24/2023 2:48 PM

## 2023-05-22 ENCOUNTER — Telehealth: Payer: Self-pay | Admitting: *Deleted

## 2023-05-22 ENCOUNTER — Other Ambulatory Visit: Payer: Self-pay | Admitting: *Deleted

## 2023-05-22 DIAGNOSIS — D509 Iron deficiency anemia, unspecified: Secondary | ICD-10-CM

## 2023-05-22 NOTE — Telephone Encounter (Signed)
Called asking if she can come in tomorrow for lab check as she is feeling very tired and has no energy and she will be in the area with her husband. Her next appointment in snot until September. Please advise

## 2023-05-22 NOTE — Telephone Encounter (Signed)
Okay for her to come and get CBC ferritin iron studies and B12 level checked either this week or next week

## 2023-05-22 NOTE — Telephone Encounter (Signed)
Patient scheduled and per Morrie Sheldon she is aware of appointment

## 2023-05-22 NOTE — Telephone Encounter (Signed)
Pt called and left and says that she needs to move labs moved up. Pt having fatigue and thinks she may need  IV iron. We made an appt for her tomorrow. Staff gave her the date and time and I entered the the orders

## 2023-05-23 ENCOUNTER — Inpatient Hospital Stay: Payer: BC Managed Care – PPO | Attending: Oncology

## 2023-05-23 ENCOUNTER — Other Ambulatory Visit: Payer: Self-pay | Admitting: *Deleted

## 2023-05-23 DIAGNOSIS — D509 Iron deficiency anemia, unspecified: Secondary | ICD-10-CM

## 2023-05-23 DIAGNOSIS — Z79899 Other long term (current) drug therapy: Secondary | ICD-10-CM | POA: Diagnosis not present

## 2023-05-23 LAB — CBC
HCT: 45.3 % (ref 36.0–46.0)
Hemoglobin: 14.9 g/dL (ref 12.0–15.0)
MCH: 28.8 pg (ref 26.0–34.0)
MCHC: 32.9 g/dL (ref 30.0–36.0)
MCV: 87.5 fL (ref 80.0–100.0)
Platelets: 372 10*3/uL (ref 150–400)
RBC: 5.18 MIL/uL — ABNORMAL HIGH (ref 3.87–5.11)
RDW: 13.8 % (ref 11.5–15.5)
WBC: 9.8 10*3/uL (ref 4.0–10.5)
nRBC: 0 % (ref 0.0–0.2)

## 2023-05-23 LAB — IRON AND TIBC
Iron: 72 ug/dL (ref 28–170)
Saturation Ratios: 20 % (ref 10.4–31.8)
TIBC: 360 ug/dL (ref 250–450)
UIBC: 288 ug/dL

## 2023-05-23 LAB — FERRITIN: Ferritin: 39 ng/mL (ref 11–307)

## 2023-05-24 ENCOUNTER — Encounter: Payer: Self-pay | Admitting: *Deleted

## 2023-05-24 ENCOUNTER — Telehealth: Payer: Self-pay | Admitting: *Deleted

## 2023-05-24 LAB — VITAMIN B12: Vitamin B-12: 4327 pg/mL — ABNORMAL HIGH (ref 180–914)

## 2023-05-24 NOTE — Telephone Encounter (Signed)
Called patient to let her know that her ferritin is low and if she wants to have any IV iron, patient says that she is feeling fatigued and having some ringing in her ears and so she does want the IV iron.  I told her that we will check with how much you can have and what type of IV iron and then Meagan the scheduler will call her and let her know about the dates and times. I added megan to the chat so she can set up appts

## 2023-05-31 ENCOUNTER — Inpatient Hospital Stay: Payer: BC Managed Care – PPO

## 2023-05-31 VITALS — BP 120/83 | HR 98 | Temp 97.7°F | Resp 18

## 2023-05-31 DIAGNOSIS — D509 Iron deficiency anemia, unspecified: Secondary | ICD-10-CM

## 2023-05-31 MED ORDER — SODIUM CHLORIDE 0.9 % IV SOLN
Freq: Once | INTRAVENOUS | Status: AC
  Filled 2023-05-31: qty 250

## 2023-05-31 MED ORDER — SODIUM CHLORIDE 0.9 % IV SOLN
200.0000 mg | INTRAVENOUS | Status: DC
  Administered 2023-05-31: 200 mg via INTRAVENOUS
  Filled 2023-05-31: qty 200

## 2023-06-01 MED FILL — Iron Sucrose Inj 20 MG/ML (Fe Equiv): INTRAVENOUS | Qty: 10 | Status: AC

## 2023-06-02 ENCOUNTER — Inpatient Hospital Stay: Payer: BC Managed Care – PPO

## 2023-06-02 VITALS — BP 107/75 | HR 71 | Temp 97.6°F | Resp 18

## 2023-06-02 DIAGNOSIS — D509 Iron deficiency anemia, unspecified: Secondary | ICD-10-CM | POA: Diagnosis not present

## 2023-06-02 MED ORDER — SODIUM CHLORIDE 0.9 % IV SOLN
200.0000 mg | INTRAVENOUS | Status: DC
  Administered 2023-06-02: 200 mg via INTRAVENOUS
  Filled 2023-06-02: qty 200

## 2023-06-02 MED ORDER — SODIUM CHLORIDE 0.9 % IV SOLN
INTRAVENOUS | Status: DC
  Filled 2023-06-02: qty 250

## 2023-06-02 NOTE — Progress Notes (Signed)
Pt has been educated and understands. Pt declined to stay 30 mins after iron infusion. VSS.

## 2023-06-05 ENCOUNTER — Inpatient Hospital Stay: Payer: BC Managed Care – PPO

## 2023-06-05 VITALS — BP 109/80 | HR 91 | Temp 97.7°F | Resp 18

## 2023-06-05 DIAGNOSIS — D509 Iron deficiency anemia, unspecified: Secondary | ICD-10-CM | POA: Diagnosis not present

## 2023-06-05 MED ORDER — SODIUM CHLORIDE 0.9 % IV SOLN
INTRAVENOUS | Status: DC
  Filled 2023-06-05: qty 250

## 2023-06-05 MED ORDER — SODIUM CHLORIDE 0.9 % IV SOLN
200.0000 mg | INTRAVENOUS | Status: DC
  Administered 2023-06-05: 200 mg via INTRAVENOUS
  Filled 2023-06-05: qty 200

## 2023-06-05 NOTE — Progress Notes (Signed)
Pt has been educated and understands. Pt declined to stay 30 mins after iron infusion. VSS.

## 2023-07-26 ENCOUNTER — Inpatient Hospital Stay: Payer: BC Managed Care – PPO | Attending: Oncology

## 2023-07-26 DIAGNOSIS — D509 Iron deficiency anemia, unspecified: Secondary | ICD-10-CM | POA: Insufficient documentation

## 2023-07-26 DIAGNOSIS — Z79899 Other long term (current) drug therapy: Secondary | ICD-10-CM | POA: Insufficient documentation

## 2023-07-26 LAB — CBC WITH DIFFERENTIAL/PLATELET
Abs Immature Granulocytes: 0.03 10*3/uL (ref 0.00–0.07)
Basophils Absolute: 0.1 10*3/uL (ref 0.0–0.1)
Basophils Relative: 1 %
Eosinophils Absolute: 0.1 10*3/uL (ref 0.0–0.5)
Eosinophils Relative: 2 %
HCT: 45.6 % (ref 36.0–46.0)
Hemoglobin: 14.9 g/dL (ref 12.0–15.0)
Immature Granulocytes: 0 %
Lymphocytes Relative: 13 %
Lymphs Abs: 1.1 10*3/uL (ref 0.7–4.0)
MCH: 29.2 pg (ref 26.0–34.0)
MCHC: 32.7 g/dL (ref 30.0–36.0)
MCV: 89.4 fL (ref 80.0–100.0)
Monocytes Absolute: 0.6 10*3/uL (ref 0.1–1.0)
Monocytes Relative: 7 %
Neutro Abs: 6.7 10*3/uL (ref 1.7–7.7)
Neutrophils Relative %: 77 %
Platelets: 315 10*3/uL (ref 150–400)
RBC: 5.1 MIL/uL (ref 3.87–5.11)
RDW: 13.4 % (ref 11.5–15.5)
WBC: 8.6 10*3/uL (ref 4.0–10.5)
nRBC: 0 % (ref 0.0–0.2)

## 2023-07-26 LAB — IRON AND TIBC
Iron: 77 ug/dL (ref 28–170)
Saturation Ratios: 25 % (ref 10.4–31.8)
TIBC: 304 ug/dL (ref 250–450)
UIBC: 227 ug/dL

## 2023-07-26 LAB — FERRITIN: Ferritin: 150 ng/mL (ref 11–307)

## 2023-08-01 ENCOUNTER — Other Ambulatory Visit: Payer: Self-pay | Admitting: Surgery

## 2023-08-08 ENCOUNTER — Encounter
Admission: RE | Admit: 2023-08-08 | Discharge: 2023-08-08 | Disposition: A | Discharge status: Home / Self Care | Payer: BC Managed Care – PPO | Source: Ambulatory Visit | Attending: Surgery | Admitting: Surgery

## 2023-08-08 ENCOUNTER — Other Ambulatory Visit: Payer: Self-pay

## 2023-08-08 VITALS — BP 137/88 | HR 98 | Resp 16 | Ht 60.0 in | Wt 174.4 lb

## 2023-08-08 DIAGNOSIS — Z01810 Encounter for preprocedural cardiovascular examination: Secondary | ICD-10-CM | POA: Insufficient documentation

## 2023-08-08 DIAGNOSIS — Z01818 Encounter for other preprocedural examination: Secondary | ICD-10-CM | POA: Diagnosis present

## 2023-08-08 DIAGNOSIS — Z01812 Encounter for preprocedural laboratory examination: Secondary | ICD-10-CM | POA: Diagnosis not present

## 2023-08-08 DIAGNOSIS — R9431 Abnormal electrocardiogram [ECG] [EKG]: Secondary | ICD-10-CM | POA: Diagnosis not present

## 2023-08-08 DIAGNOSIS — E119 Type 2 diabetes mellitus without complications: Secondary | ICD-10-CM | POA: Insufficient documentation

## 2023-08-08 HISTORY — DX: Unilateral primary osteoarthritis, right knee: M17.11

## 2023-08-08 HISTORY — DX: Tachycardia, unspecified: R00.0

## 2023-08-08 HISTORY — DX: Pneumonia, unspecified organism: J18.9

## 2023-08-08 HISTORY — DX: Atherosclerosis of aorta: I70.0

## 2023-08-08 LAB — URINALYSIS, ROUTINE W REFLEX MICROSCOPIC
Bilirubin Urine: NEGATIVE
Glucose, UA: NEGATIVE mg/dL
Hgb urine dipstick: NEGATIVE
Ketones, ur: NEGATIVE mg/dL
Leukocytes,Ua: NEGATIVE
Nitrite: NEGATIVE
Protein, ur: NEGATIVE mg/dL
Specific Gravity, Urine: 1.003 — ABNORMAL LOW (ref 1.005–1.030)
pH: 6 (ref 5.0–8.0)

## 2023-08-08 LAB — COMPREHENSIVE METABOLIC PANEL
ALT: 15 U/L (ref 0–44)
AST: 16 U/L (ref 15–41)
Albumin: 4.2 g/dL (ref 3.5–5.0)
Alkaline Phosphatase: 83 U/L (ref 38–126)
Anion gap: 11 (ref 5–15)
BUN: 10 mg/dL (ref 8–23)
CO2: 27 mmol/L (ref 22–32)
Calcium: 10.2 mg/dL (ref 8.9–10.3)
Chloride: 100 mmol/L (ref 98–111)
Creatinine, Ser: 0.76 mg/dL (ref 0.44–1.00)
GFR, Estimated: >60 mL/min (ref >60)
Glucose, Bld: 92 mg/dL (ref 70–99)
Potassium: 3.8 mmol/L (ref 3.5–5.1)
Sodium: 138 mmol/L (ref 135–145)
Total Bilirubin: 0.8 mg/dL (ref 0.3–1.2)
Total Protein: 7.3 g/dL (ref 6.5–8.1)

## 2023-08-08 LAB — CBC WITH DIFFERENTIAL/PLATELET
Abs Immature Granulocytes: 0.04 10*3/uL (ref 0.00–0.07)
Basophils Absolute: 0.1 10*3/uL (ref 0.0–0.1)
Basophils Relative: 1 %
Eosinophils Absolute: 0.2 10*3/uL (ref 0.0–0.5)
Eosinophils Relative: 2 %
HCT: 49.8 % — ABNORMAL HIGH (ref 36.0–46.0)
Hemoglobin: 16.2 g/dL — ABNORMAL HIGH (ref 12.0–15.0)
Immature Granulocytes: 1 %
Lymphocytes Relative: 16 %
Lymphs Abs: 1.3 10*3/uL (ref 0.7–4.0)
MCH: 28.9 pg (ref 26.0–34.0)
MCHC: 32.5 g/dL (ref 30.0–36.0)
MCV: 88.8 fL (ref 80.0–100.0)
Monocytes Absolute: 0.7 10*3/uL (ref 0.1–1.0)
Monocytes Relative: 8 %
Neutro Abs: 5.8 10*3/uL (ref 1.7–7.7)
Neutrophils Relative %: 72 %
Platelets: 401 10*3/uL — ABNORMAL HIGH (ref 150–400)
RBC: 5.61 MIL/uL — ABNORMAL HIGH (ref 3.87–5.11)
RDW: 13.2 % (ref 11.5–15.5)
WBC: 8 10*3/uL (ref 4.0–10.5)
nRBC: 0 % (ref 0.0–0.2)

## 2023-08-08 LAB — TYPE AND SCREEN
ABO/RH(D): O POS
Antibody Screen: NEGATIVE

## 2023-08-08 LAB — SURGICAL PCR SCREEN
MRSA, PCR: NEGATIVE
Staphylococcus aureus: POSITIVE — AB

## 2023-08-08 NOTE — Patient Instructions (Addendum)
Your procedure is scheduled on: 08/15/23 - Tuesday Report to the Registration Desk on the 1st floor of the [facility]. To find out your arrival time, please call [phone] between 1PM - 3PM on: 08/14/23 - Monday If your arrival time is 6:00 am, do not arrive before that time as the [facility] entrance doors do not open until 6:00 am.  REMEMBER: Instructions that are not followed completely may result in serious medical risk, up to and including death; or upon the discretion of your surgeon and anesthesiologist your surgery may need to be rescheduled.  Do not eat food after midnight the night before surgery.  No gum chewing or hard candies.  You may drink water up to 2 hours before you are scheduled to arrive for your surgery. Do not drink anything within 2 hours of your scheduled arrival time.  In addition, your doctor has ordered for you to drink the provided:  Gatorade G2 Drinking this carbohydrate drink up to two hours before surgery helps to reduce insulin resistance and improve patient outcomes. Please complete drinking 2 hours before scheduled arrival time.  One week prior to surgery: Stop Anti-inflammatories (NSAIDS) such as Advil, Aleve, Ibuprofen, Motrin, Naproxen, Naprosyn and Aspirin based products such as Excedrin, Goody's Powder, BC Powder. You may however, continue to take Tylenol if needed for pain up until the day of surgery.  Stop ANY OVER THE COUNTER supplements until after surgery.   Continue taking all prescribed medications with the exception of the following:  OZEMPIC hold 7 days prior to your procedure. meloxicam (MOBIC) hold 7 days prior to your procedure. Aspirin hold 5 days    TAKE ONLY THESE MEDICATIONS THE MORNING OF SURGERY WITH A SIP OF WATER:  omeprazole (PRILOSEC) - (take one the night before and one on the morning of surgery - helps to prevent nausea after surgery.) atenolol (TENORMIN)   Use albuterol (PROVENTIL)  on the day of surgery and  bring to the hospital.   No Alcohol for 24 hours before or after surgery.  No Smoking including e-cigarettes for 24 hours before surgery.  No chewable tobacco products for at least 6 hours before surgery.  No nicotine patches on the day of surgery.  Do not use any "recreational" drugs for at least a week (preferably 2 weeks) before your surgery.  Please be advised that the combination of cocaine and anesthesia may have negative outcomes, up to and including death. If you test positive for cocaine, your surgery will be cancelled.  On the morning of surgery brush your teeth with toothpaste and water, you may rinse your mouth with mouthwash if you wish. Do not swallow any toothpaste or mouthwash.  Use CHG Soap or wipes as directed on instruction sheet.  Do not wear jewelry, make-up, hairpins, clips or nail polish.  For welded (permanent) jewelry: bracelets, anklets, waist bands, etc.  Please have this removed prior to surgery.  If it is not removed, there is a chance that hospital personnel will need to cut it off on the day of surgery.  Do not wear lotions, powders, or perfumes.   Contact lenses, hearing aids and dentures may not be worn into surgery.  Do not bring valuables to the hospital. [facility] is not responsible for any missing/lost belongings or valuables.   Notify your doctor if there is any change in your medical condition (cold, fever, infection).  Wear comfortable clothing (specific to your surgery type) to the hospital.  After surgery, you can help  prevent lung complications by doing breathing exercises.  Take deep breaths and cough every 1-2 hours. Your doctor may order a device called an Incentive Spirometer to help you take deep breaths. When coughing or sneezing, hold a pillow firmly against your incision with both hands. This is called "splinting." Doing this helps protect your incision. It also decreases belly discomfort.  If you are being admitted to the  hospital overnight, leave your suitcase in the car. After surgery it may be brought to your room.  In case of increased patient census, it may be necessary for you, the patient, to continue your postoperative care in the Same Day Surgery department.  If you are being discharged the day of surgery, you will not be allowed to drive home. You will need a responsible individual to drive you home and stay with you for 24 hours after surgery.   If you are taking public transportation, you will need to have a responsible individual with you.  Please call the Pre-admissions Testing Dept. at [phone] if you have any questions about these instructions.  Surgery Visitation Policy:  Patients having surgery or a procedure may have two visitors.  Children under the age of 62 must have an adult with them who is not the patient.  Inpatient Visitation:    Visiting hours are 7 a.m. to 8 p.m. Up to four visitors are allowed at one time in a patient room. The visitors may rotate out with other people during the day.  One visitor age 44 or older may stay with the patient overnight and must be in the room by 8 p.m.    Pre-operative 5 CHG Bath Instructions   You can play a key role in reducing the risk of infection after surgery. Your skin needs to be as free of germs as possible. You can reduce the number of germs on your skin by washing with CHG (chlorhexidine gluconate) soap before surgery. CHG is an antiseptic soap that kills germs and continues to kill germs even after washing.   DO NOT use if you have an allergy to chlorhexidine/CHG or antibacterial soaps. If your skin becomes reddened or irritated, stop using the CHG and notify one of our RNs at [phone].   Please shower with the CHG soap starting 4 days before surgery using the following schedule: 10/04 - 10/08.    Please keep in mind the following:  DO NOT shave, including legs and underarms, starting the day of your first shower.    You may shave your face at any point before/day of surgery.  Place clean sheets on your bed the day you start using CHG soap. Use a clean washcloth (not used since being washed) for each shower. DO NOT sleep with pets once you start using the CHG.   CHG Shower Instructions:  If you choose to wash your hair and private area, wash first with your normal shampoo/soap.  After you use shampoo/soap, rinse your hair and body thoroughly to remove shampoo/soap residue.  Turn the water OFF and apply about 3 tablespoons (45 ml) of CHG soap to a CLEAN washcloth.  Apply CHG soap ONLY FROM YOUR NECK DOWN TO YOUR TOES (washing for 3-5 minutes)  DO NOT use CHG soap on face, private areas, open wounds, or sores.  Pay special attention to the area where your surgery is being performed.  If you are having back surgery, having someone wash your back for you may be helpful. Wait 2 minutes after CHG soap  is applied, then you may rinse off the CHG soap.  Pat dry with a clean towel  Put on clean clothes/pajamas   If you choose to wear lotion, please use ONLY the CHG-compatible lotions on the back of this paper.     Additional instructions for the day of surgery: DO NOT APPLY any lotions, deodorants, cologne, or perfumes.   Put on clean/comfortable clothes.  Brush your teeth.  Ask your nurse before applying any prescription medications to the skin.      CHG Compatible Lotions   Aveeno Moisturizing lotion  Cetaphil Moisturizing Cream  Cetaphil Moisturizing Lotion  Clairol Herbal Essence Moisturizing Lotion, Dry Skin  Clairol Herbal Essence Moisturizing Lotion, Extra Dry Skin  Clairol Herbal Essence Moisturizing Lotion, Normal Skin  Curel Age Defying Therapeutic Moisturizing Lotion with Alpha Hydroxy  Curel Extreme Care Body Lotion  Curel Soothing Hands Moisturizing Hand Lotion  Curel Therapeutic Moisturizing Cream, Fragrance-Free  Curel Therapeutic Moisturizing Lotion, Fragrance-Free  Curel  Therapeutic Moisturizing Lotion, Original Formula  Eucerin Daily Replenishing Lotion  Eucerin Dry Skin Therapy Plus Alpha Hydroxy Crme  Eucerin Dry Skin Therapy Plus Alpha Hydroxy Lotion  Eucerin Original Crme  Eucerin Original Lotion  Eucerin Plus Crme Eucerin Plus Lotion  Eucerin TriLipid Replenishing Lotion  Keri Anti-Bacterial Hand Lotion  Keri Deep Conditioning Original Lotion Dry Skin Formula Softly Scented  Keri Deep Conditioning Original Lotion, Fragrance Free Sensitive Skin Formula  Keri Lotion Fast Absorbing Fragrance Free Sensitive Skin Formula  Keri Lotion Fast Absorbing Softly Scented Dry Skin Formula  Keri Original Lotion  Keri Skin Renewal Lotion Keri Silky Smooth Lotion  Keri Silky Smooth Sensitive Skin Lotion  Nivea Body Creamy Conditioning Oil  Nivea Body Extra Enriched Lotion  Nivea Body Original Lotion  Nivea Body Sheer Moisturizing Lotion Nivea Crme  Nivea Skin Firming Lotion  NutraDerm 30 Skin Lotion  NutraDerm Skin Lotion  NutraDerm Therapeutic Skin Cream  NutraDerm Therapeutic Skin Lotion  ProShield Protective Hand Cream  Provon moisturizing lotion  How to Use an Incentive Spirometer  An incentive spirometer is a tool that measures how well you are filling your lungs with each breath. Learning to take long, deep breaths using this tool can help you keep your lungs clear and active. This may help to reverse or lessen your chance of developing breathing (pulmonary) problems, especially infection. You may be asked to use a spirometer: After a surgery. If you have a lung problem or a history of smoking. After a long period of time when you have been unable to move or be active. If the spirometer includes an indicator to show the highest number that you have reached, your health care provider or respiratory therapist will help you set a goal. Keep a log of your progress as told by your health care provider. What are the risks? Breathing too quickly may cause  dizziness or cause you to pass out. Take your time so you do not get dizzy or light-headed. If you are in pain, you may need to take pain medicine before doing incentive spirometry. It is harder to take a deep breath if you are having pain. How to use your incentive spirometer  Sit up on the edge of your bed or on a chair. Hold the incentive spirometer so that it is in an upright position. Before you use the spirometer, breathe out normally. Place the mouthpiece in your mouth. Make sure your lips are closed tightly around it. Breathe in slowly and as deeply as you can  through your mouth, causing the piston or the ball to rise toward the top of the chamber. Hold your breath for 3-5 seconds, or for as long as possible. If the spirometer includes a coach indicator, use this to guide you in breathing. Slow down your breathing if the indicator goes above the marked areas. Remove the mouthpiece from your mouth and breathe out normally. The piston or ball will return to the bottom of the chamber. Rest for a few seconds, then repeat the steps 10 or more times. Take your time and take a few normal breaths between deep breaths so that you do not get dizzy or light-headed. Do this every 1-2 hours when you are awake. If the spirometer includes a goal marker to show the highest number you have reached (best effort), use this as a goal to work toward during each repetition. After each set of 10 deep breaths, cough a few times. This will help to make sure that your lungs are clear. If you have an incision on your chest or abdomen from surgery, place a pillow or a rolled-up towel firmly against the incision when you cough. This can help to reduce pain while taking deep breaths and coughing. General tips When you are able to get out of bed: Walk around often. Continue to take deep breaths and cough in order to clear your lungs. Keep using the incentive spirometer until your health care provider says it is okay  to stop using it. If you have been in the hospital, you may be told to keep using the spirometer at home. Contact a health care provider if: You are having difficulty using the spirometer. You have trouble using the spirometer as often as instructed. Your pain medicine is not giving enough relief for you to use the spirometer as told. You have a fever. Get help right away if: You develop shortness of breath. You develop a cough with bloody mucus from the lungs. You have fluid or blood coming from an incision site after you cough. Summary An incentive spirometer is a tool that can help you learn to take long, deep breaths to keep your lungs clear and active. You may be asked to use a spirometer after a surgery, if you have a lung problem or a history of smoking, or if you have been inactive for a long period of time. Use your incentive spirometer as instructed every 1-2 hours while you are awake. If you have an incision on your chest or abdomen, place a pillow or a rolled-up towel firmly against your incision when you cough. This will help to reduce pain. Get help right away if you have shortness of breath, you cough up bloody mucus, or blood comes from your incision when you cough. This information is not intended to replace advice given to you by your health care provider. Make sure you discuss any questions you have with your health care provider.      POLAR CARE INFORMATION  MassAdvertisement.it  How to use Breg Polar Care [facility] Therapy System?  YouTube   ShippingScam.co.uk  OPERATING INSTRUCTIONS  Start the product With dry hands, connect the transformer to the electrical connection located on the top of the cooler. Next, plug the transformer into an appropriate electrical outlet. The unit will automatically start running at this point.  To stop the pump, disconnect electrical power.  Unplug to stop the product when not in use. Unplugging the Polar Care  unit turns it off. Always unplug immediately  after use. Never leave it plugged in while unattended. Remove pad.    FIRST ADD WATER TO FILL LINE, THEN ICE---Replace ice when existing ice is almost melted  1 Discuss Treatment with your Licensed Health Care Practitioner and Use Only as Prescribed 2 Apply Insulation Barrier & Cold Therapy Pad 3 Check for Moisture 4 Inspect Skin Regularly  Tips and Trouble Shooting Usage Tips 1. Use cubed or chunked ice for optimal performance. 2. It is recommended to drain the Pad between uses. To drain the pad, hold the Pad upright with the hose pointed toward the ground. Depress the black plunger and allow water to drain out. 3. You may disconnect the Pad from the unit without removing the pad from the affected area by depressing the silver tabs on the hose coupling and gently pulling the hoses apart. The Pad and unit will seal itself and will not leak. Note: Some dripping during release is normal. 4. DO NOT RUN PUMP WITHOUT WATER! The pump in this unit is designed to run with water. Running the unit without water will cause permanent damage to the pump. 5. Unplug unit before removing lid.  TROUBLESHOOTING GUIDE Pump not running, Water not flowing to the pad, Pad is not getting cold 1. Make sure the transformer is plugged into the wall outlet. 2. Confirm that the ice and water are filled to the indicated levels. 3. Make sure there are no kinks in the pad. 4. Gently pull on the blue tube to make sure the tube/pad junction is straight. 5. Remove the pad from the treatment site and ll it while the pad is lying at; then reapply. 6. Confirm that the pad couplings are securely attached to the unit. Listen for the double clicks (Figure 1) to confirm the pad couplings are securely attached.  Leaks    Note: Some condensation on the lines, controller, and pads is unavoidable, especially in warmer climates. 1. If using a Breg Polar Care Cold Therapy unit with a  detachable Cold Therapy Pad, and a leak exists (other than condensation on the lines) disconnect the pad couplings. Make sure the silver tabs on the couplings are depressed before reconnecting the pad to the pump hose; then confirm both sides of the coupling are properly clicked in. 2. If the coupling continues to leak or a leak is detected in the pad itself, stop using it and call B[employer] Customer Care at [phone].  Cleaning After use, empty and dry the unit with a soft cloth. Warm water and mild detergent may be used occasionally to clean the pump and tubes.  WARNING: The Polar Care Cube can be cold enough to cause serious injury, including full skin necrosis. Follow these Operating Instructions, and carefully read the Product Insert (see pouch on side of unit) and the Cold Therapy Pad Fitting Instructions (provided with each Cold Therapy Pad) prior to use.

## 2023-08-14 MED ORDER — ORAL CARE MOUTH RINSE: 15.0000 mL | Freq: Once | OROMUCOSAL | Status: AC

## 2023-08-14 MED ORDER — TRANEXAMIC ACID-NACL 1000-0.7 MG/100ML-% IV SOLN
1000.0000 mg | INTRAVENOUS | Status: AC
  Administered 2023-08-15: 1000 mg via INTRAVENOUS

## 2023-08-14 MED ORDER — VANCOMYCIN HCL IN DEXTROSE 1-5 GM/200ML-% IV SOLN
1000.0000 mg | INTRAVENOUS | Status: AC
  Administered 2023-08-15: 1000 mg via INTRAVENOUS

## 2023-08-14 MED ORDER — SODIUM CHLORIDE 0.9 % IV SOLN: INTRAVENOUS | Status: DC

## 2023-08-14 MED ORDER — CHLORHEXIDINE GLUCONATE 0.12 % MT SOLN
15.0000 mL | Freq: Once | OROMUCOSAL | Status: AC
  Administered 2023-08-15: 15 mL via OROMUCOSAL

## 2023-08-15 ENCOUNTER — Ambulatory Visit: Payer: BC Managed Care – PPO | Admitting: Urgent Care

## 2023-08-15 ENCOUNTER — Ambulatory Visit: Payer: BC Managed Care – PPO

## 2023-08-15 ENCOUNTER — Ambulatory Visit
Admission: RE | Admit: 2023-08-15 | Discharge: 2023-08-16 | Disposition: A | Discharge status: Home / Self Care | Payer: BC Managed Care – PPO | Source: Ambulatory Visit | Attending: Surgery | Admitting: Surgery

## 2023-08-15 ENCOUNTER — Other Ambulatory Visit: Payer: Self-pay

## 2023-08-15 ENCOUNTER — Encounter: Payer: Self-pay | Admitting: Surgery

## 2023-08-15 ENCOUNTER — Encounter
Admission: RE | Disposition: A | Discharge status: Home / Self Care | Payer: Self-pay | Source: Ambulatory Visit | Attending: Surgery

## 2023-08-15 DIAGNOSIS — M1711 Unilateral primary osteoarthritis, right knee: Secondary | ICD-10-CM | POA: Insufficient documentation

## 2023-08-15 DIAGNOSIS — Z96652 Presence of left artificial knee joint: Secondary | ICD-10-CM | POA: Insufficient documentation

## 2023-08-15 DIAGNOSIS — E119 Type 2 diabetes mellitus without complications: Secondary | ICD-10-CM

## 2023-08-15 DIAGNOSIS — E1121 Type 2 diabetes mellitus with diabetic nephropathy: Secondary | ICD-10-CM | POA: Diagnosis not present

## 2023-08-15 DIAGNOSIS — Z7985 Long-term (current) use of injectable non-insulin antidiabetic drugs: Secondary | ICD-10-CM | POA: Insufficient documentation

## 2023-08-15 DIAGNOSIS — Z01812 Encounter for preprocedural laboratory examination: Secondary | ICD-10-CM

## 2023-08-15 DIAGNOSIS — Z96651 Presence of right artificial knee joint: Secondary | ICD-10-CM

## 2023-08-15 HISTORY — PX: TOTAL KNEE ARTHROPLASTY: SHX125

## 2023-08-15 LAB — GLUCOSE, CAPILLARY
Glucose-Capillary: 112 mg/dL — ABNORMAL HIGH (ref 70–99)
Glucose-Capillary: 108 mg/dL — ABNORMAL HIGH (ref 70–99)

## 2023-08-15 LAB — ABO/RH: ABO/RH(D): O POS

## 2023-08-15 SURGERY — ARTHROPLASTY, KNEE, TOTAL
Anesthesia: Spinal | Site: Knee | Laterality: Right

## 2023-08-15 MED ORDER — ACETAMINOPHEN 500 MG PO TABS
1000.0000 mg | ORAL_TABLET | Freq: Four times a day (QID) | ORAL | Status: DC
  Administered 2023-08-15 – 2023-08-16 (×3): 1000 mg via ORAL

## 2023-08-15 MED ORDER — ACETAMINOPHEN 10 MG/ML IV SOLN
INTRAVENOUS | Status: DC | PRN
  Administered 2023-08-15: 1000 mg via INTRAVENOUS

## 2023-08-15 MED ORDER — PROPOFOL 1000 MG/100ML IV EMUL
INTRAVENOUS | Status: AC
  Filled 2023-08-15: qty 100

## 2023-08-15 MED ORDER — BUPIVACAINE LIPOSOME 1.3 % IJ SUSP
INTRAMUSCULAR | Status: AC
  Filled 2023-08-15: qty 20

## 2023-08-15 MED ORDER — CYCLOBENZAPRINE HCL 5 MG PO TABS
5.0000 mg | ORAL_TABLET | Freq: Three times a day (TID) | ORAL | Status: DC | PRN
  Administered 2023-08-15: 5 mg via ORAL
  Filled 2023-08-15 (×2): qty 1

## 2023-08-15 MED ORDER — BISACODYL 10 MG RE SUPP: 10.0000 mg | Freq: Every day | RECTAL | Status: DC | PRN

## 2023-08-15 MED ORDER — GABAPENTIN 400 MG PO CAPS
800.0000 mg | ORAL_CAPSULE | Freq: Every day | ORAL | Status: DC
  Filled 2023-08-15: qty 2

## 2023-08-15 MED ORDER — DOCUSATE SODIUM 100 MG PO CAPS
100.0000 mg | ORAL_CAPSULE | Freq: Two times a day (BID) | ORAL | Status: DC
  Administered 2023-08-16: 100 mg via ORAL

## 2023-08-15 MED ORDER — ONDANSETRON HCL 4 MG PO TABS: 4.0000 mg | ORAL_TABLET | Freq: Four times a day (QID) | ORAL | Status: DC | PRN

## 2023-08-15 MED ORDER — DOCUSATE SODIUM 100 MG PO CAPS
ORAL_CAPSULE | ORAL | Status: AC
  Filled 2023-08-15: qty 1

## 2023-08-15 MED ORDER — PHENYLEPHRINE HCL-NACL 20-0.9 MG/250ML-% IV SOLN
INTRAVENOUS | Status: DC | PRN
Start: 2023-08-15 — End: 2023-08-15
  Administered 2023-08-15: 20 ug/min via INTRAVENOUS

## 2023-08-15 MED ORDER — MAGNESIUM OXIDE -MG SUPPLEMENT 400 (240 MG) MG PO TABS
400.0000 mg | ORAL_TABLET | Freq: Every day | ORAL | Status: DC
  Administered 2023-08-16: 400 mg via ORAL
  Filled 2023-08-15: qty 1

## 2023-08-15 MED ORDER — PHENYLEPHRINE HCL (PRESSORS) 10 MG/ML IV SOLN
INTRAVENOUS | Status: DC | PRN
  Administered 2023-08-15: 80 ug via INTRAVENOUS
  Administered 2023-08-15: 120 ug via INTRAVENOUS
  Administered 2023-08-15 (×2): 80 ug via INTRAVENOUS
  Administered 2023-08-15 (×2): 120 ug via INTRAVENOUS
  Administered 2023-08-15: 80 ug via INTRAVENOUS

## 2023-08-15 MED ORDER — DIPHENHYDRAMINE HCL 12.5 MG/5ML PO ELIX: 12.5000 mg | ORAL_SOLUTION | ORAL | Status: DC | PRN

## 2023-08-15 MED ORDER — TRIAMCINOLONE ACETONIDE 40 MG/ML IJ SUSP
INTRAMUSCULAR | Status: DC | PRN
  Administered 2023-08-15: 92.5 mL via INTRAMUSCULAR

## 2023-08-15 MED ORDER — MAGNESIUM HYDROXIDE 400 MG/5ML PO SUSP: 30.0000 mL | Freq: Every day | ORAL | Status: DC | PRN

## 2023-08-15 MED ORDER — ONDANSETRON HCL 4 MG/2ML IJ SOLN
INTRAMUSCULAR | Status: DC | PRN
  Administered 2023-08-15: 4 mg via INTRAVENOUS

## 2023-08-15 MED ORDER — VANCOMYCIN HCL IN DEXTROSE 1-5 GM/200ML-% IV SOLN
1000.0000 mg | Freq: Two times a day (BID) | INTRAVENOUS | Status: AC
  Administered 2023-08-15: 1000 mg via INTRAVENOUS

## 2023-08-15 MED ORDER — BUPIVACAINE-EPINEPHRINE (PF) 0.5% -1:200000 IJ SOLN
INTRAMUSCULAR | Status: AC
  Filled 2023-08-15: qty 10

## 2023-08-15 MED ORDER — CHLORHEXIDINE GLUCONATE 0.12 % MT SOLN
OROMUCOSAL | Status: AC
  Filled 2023-08-15: qty 15

## 2023-08-15 MED ORDER — ACETAMINOPHEN 500 MG PO TABS
ORAL_TABLET | ORAL | Status: AC
  Filled 2023-08-15: qty 2

## 2023-08-15 MED ORDER — PROPOFOL 10 MG/ML IV BOLUS
INTRAVENOUS | Status: DC | PRN
  Administered 2023-08-15: 40 mg via INTRAVENOUS

## 2023-08-15 MED ORDER — ONDANSETRON HCL 4 MG/2ML IJ SOLN: 4.0000 mg | Freq: Four times a day (QID) | INTRAMUSCULAR | Status: DC | PRN

## 2023-08-15 MED ORDER — MIDAZOLAM HCL 2 MG/2ML IJ SOLN
INTRAMUSCULAR | Status: AC
  Filled 2023-08-15: qty 2

## 2023-08-15 MED ORDER — APIXABAN 2.5 MG PO TABS
2.5000 mg | ORAL_TABLET | Freq: Two times a day (BID) | ORAL | Status: DC
  Administered 2023-08-16: 2.5 mg via ORAL

## 2023-08-15 MED ORDER — HYDROMORPHONE HCL 1 MG/ML IJ SOLN: 0.5000 mg | INTRAMUSCULAR | Status: DC | PRN

## 2023-08-15 MED ORDER — SUMATRIPTAN SUCCINATE 50 MG PO TABS
100.0000 mg | ORAL_TABLET | ORAL | Status: DC | PRN
  Administered 2023-08-16: 100 mg via ORAL
  Filled 2023-08-15 (×2): qty 2

## 2023-08-15 MED ORDER — ALBUMIN HUMAN 5 % IV SOLN
12.5000 g | Freq: Once | INTRAVENOUS | Status: AC
  Administered 2023-08-15: 12.5 g via INTRAVENOUS

## 2023-08-15 MED ORDER — OXYCODONE HCL 5 MG/5ML PO SOLN: 5.0000 mg | Freq: Once | ORAL | Status: DC | PRN

## 2023-08-15 MED ORDER — SODIUM CHLORIDE 0.9 % IV SOLN: INTRAVENOUS | Status: DC

## 2023-08-15 MED ORDER — KETOROLAC TROMETHAMINE 15 MG/ML IJ SOLN
INTRAMUSCULAR | Status: AC
  Filled 2023-08-15: qty 1

## 2023-08-15 MED ORDER — APIXABAN 2.5 MG PO TABS: 2.5000 mg | ORAL_TABLET | Freq: Two times a day (BID) | ORAL | 0 refills | Status: DC

## 2023-08-15 MED ORDER — METOCLOPRAMIDE HCL 10 MG PO TABS: 5.0000 mg | ORAL_TABLET | Freq: Three times a day (TID) | ORAL | Status: DC | PRN

## 2023-08-15 MED ORDER — ALBUTEROL SULFATE (2.5 MG/3ML) 0.083% IN NEBU: 2.5000 mg | INHALATION_SOLUTION | Freq: Four times a day (QID) | RESPIRATORY_TRACT | Status: DC | PRN

## 2023-08-15 MED ORDER — FERROUS SULFATE 325 (65 FE) MG PO TABS
ORAL_TABLET | ORAL | Status: AC
  Filled 2023-08-15: qty 1

## 2023-08-15 MED ORDER — PANTOPRAZOLE SODIUM 40 MG PO TBEC
40.0000 mg | DELAYED_RELEASE_TABLET | Freq: Every day | ORAL | Status: DC
  Administered 2023-08-16: 40 mg via ORAL

## 2023-08-15 MED ORDER — TRANEXAMIC ACID-NACL 1000-0.7 MG/100ML-% IV SOLN
INTRAVENOUS | Status: AC
  Filled 2023-08-15: qty 100

## 2023-08-15 MED ORDER — TRIAMCINOLONE ACETONIDE 40 MG/ML IJ SUSP
INTRAMUSCULAR | Status: AC
  Filled 2023-08-15: qty 2

## 2023-08-15 MED ORDER — DEXAMETHASONE SODIUM PHOSPHATE 10 MG/ML IJ SOLN
INTRAMUSCULAR | Status: DC | PRN
Start: 2023-08-15 — End: 2023-08-15
  Administered 2023-08-15: 10 mg via INTRAVENOUS

## 2023-08-15 MED ORDER — OXYCODONE HCL 5 MG PO TABS
ORAL_TABLET | ORAL | Status: AC
  Filled 2023-08-15: qty 2

## 2023-08-15 MED ORDER — PHENYLEPHRINE 80 MCG/ML (10ML) SYRINGE FOR IV PUSH (FOR BLOOD PRESSURE SUPPORT)
PREFILLED_SYRINGE | INTRAVENOUS | Status: DC | PRN
Start: 2023-08-15 — End: 2023-08-15
  Administered 2023-08-15: 80 ug via INTRAVENOUS

## 2023-08-15 MED ORDER — EPHEDRINE SULFATE (PRESSORS) 50 MG/ML IJ SOLN
INTRAMUSCULAR | Status: DC | PRN
Start: 2023-08-15 — End: 2023-08-15
  Administered 2023-08-15 (×2): 10 mg via INTRAVENOUS
  Administered 2023-08-15: 5 mg via INTRAVENOUS

## 2023-08-15 MED ORDER — SIMVASTATIN 10 MG PO TABS
10.0000 mg | ORAL_TABLET | Freq: Every day | ORAL | Status: DC
  Administered 2023-08-15: 10 mg via ORAL
  Filled 2023-08-15: qty 1

## 2023-08-15 MED ORDER — VITAMIN D 25 MCG (1000 UNIT) PO TABS
2000.0000 [IU] | ORAL_TABLET | Freq: Every day | ORAL | Status: DC
  Administered 2023-08-16: 2000 [IU] via ORAL

## 2023-08-15 MED ORDER — VANCOMYCIN HCL IN DEXTROSE 1-5 GM/200ML-% IV SOLN
INTRAVENOUS | Status: AC
  Filled 2023-08-15: qty 200

## 2023-08-15 MED ORDER — ACETAMINOPHEN 325 MG PO TABS: 325.0000 mg | ORAL_TABLET | Freq: Four times a day (QID) | ORAL | Status: DC | PRN

## 2023-08-15 MED ORDER — ATENOLOL 50 MG PO TABS: 25.0000 mg | ORAL_TABLET | Freq: Every day | ORAL | Status: DC

## 2023-08-15 MED ORDER — OXYCODONE HCL 5 MG PO TABS: 5.0000 mg | ORAL_TABLET | Freq: Once | ORAL | Status: DC | PRN

## 2023-08-15 MED ORDER — BUPIVACAINE HCL (PF) 0.5 % IJ SOLN
INTRAMUSCULAR | Status: DC | PRN
  Administered 2023-08-15: 2.8 mL

## 2023-08-15 MED ORDER — OXYCODONE HCL 5 MG PO TABS: 5.0000 mg | ORAL_TABLET | ORAL | Status: DC | PRN

## 2023-08-15 MED ORDER — MIDAZOLAM HCL 5 MG/5ML IJ SOLN
INTRAMUSCULAR | Status: DC | PRN
  Administered 2023-08-15: 2 mg via INTRAVENOUS

## 2023-08-15 MED ORDER — FENTANYL CITRATE (PF) 100 MCG/2ML IJ SOLN: 25.0000 ug | INTRAMUSCULAR | Status: DC | PRN

## 2023-08-15 MED ORDER — FERROUS SULFATE 325 (65 FE) MG PO TABS
325.0000 mg | ORAL_TABLET | Freq: Every day | ORAL | Status: DC
  Administered 2023-08-15: 325 mg via ORAL

## 2023-08-15 MED ORDER — METOCLOPRAMIDE HCL 5 MG/ML IJ SOLN: 5.0000 mg | Freq: Three times a day (TID) | INTRAMUSCULAR | Status: DC | PRN

## 2023-08-15 MED ORDER — LORATADINE 10 MG PO TABS
10.0000 mg | ORAL_TABLET | Freq: Every day | ORAL | Status: DC
  Administered 2023-08-16: 10 mg via ORAL

## 2023-08-15 MED ORDER — OXYCODONE HCL 5 MG PO TABS
5.0000 mg | ORAL_TABLET | ORAL | 0 refills | Status: DC | PRN
Start: 2023-08-15 — End: 2023-08-16

## 2023-08-15 MED ORDER — ONDANSETRON HCL 4 MG/2ML IJ SOLN
INTRAMUSCULAR | Status: AC
  Filled 2023-08-15: qty 2

## 2023-08-15 MED ORDER — SODIUM CHLORIDE 0.9 % BOLUS PEDS
250.0000 mL | Freq: Once | INTRAVENOUS | Status: AC
  Administered 2023-08-15: 250 mL via INTRAVENOUS

## 2023-08-15 MED ORDER — ALBUMIN HUMAN 5 % IV SOLN
INTRAVENOUS | Status: AC
  Filled 2023-08-15: qty 250

## 2023-08-15 MED ORDER — POLYVINYL ALCOHOL 1.4 % OP SOLN
1.0000 [drp] | Freq: Three times a day (TID) | OPHTHALMIC | Status: DC | PRN
  Filled 2023-08-15: qty 15

## 2023-08-15 MED ORDER — SUMATRIPTAN SUCCINATE 100 MG PO TABS: 50.0000 mg | ORAL_TABLET | ORAL | Status: AC | PRN

## 2023-08-15 MED ORDER — FLEET ENEMA RE ENEM: 1.0000 | ENEMA | Freq: Once | RECTAL | Status: DC | PRN

## 2023-08-15 MED ORDER — SODIUM CHLORIDE 0.9 % IR SOLN
Status: DC | PRN
Start: 2023-08-15 — End: 2023-08-15
  Administered 2023-08-15: 1

## 2023-08-15 MED ORDER — KETOROLAC TROMETHAMINE 15 MG/ML IJ SOLN
15.0000 mg | Freq: Once | INTRAMUSCULAR | Status: AC
  Administered 2023-08-15: 15 mg via INTRAVENOUS

## 2023-08-15 MED ORDER — LIDOCAINE HCL (PF) 2 % IJ SOLN
INTRAMUSCULAR | Status: AC
  Filled 2023-08-15: qty 5

## 2023-08-15 MED ORDER — SODIUM CHLORIDE 0.9 % IV SOLN
INTRAVENOUS | Status: DC | PRN
Start: 2023-08-15 — End: 2023-08-15

## 2023-08-15 MED ORDER — DEXAMETHASONE SODIUM PHOSPHATE 10 MG/ML IJ SOLN
INTRAMUSCULAR | Status: AC
  Filled 2023-08-15: qty 1

## 2023-08-15 MED ORDER — OXYCODONE HCL 5 MG PO TABS
5.0000 mg | ORAL_TABLET | ORAL | Status: DC | PRN
  Administered 2023-08-15: 10 mg via ORAL
  Administered 2023-08-16: 5 mg via ORAL

## 2023-08-15 MED ORDER — PROPOFOL 500 MG/50ML IV EMUL
INTRAVENOUS | Status: DC | PRN
  Administered 2023-08-15: 100 ug/kg/min via INTRAVENOUS

## 2023-08-15 MED ORDER — SODIUM CHLORIDE FLUSH 0.9 % IV SOLN
INTRAVENOUS | Status: AC
  Filled 2023-08-15: qty 40

## 2023-08-15 MED ORDER — ADULT MULTIVITAMIN W/MINERALS CH
1.0000 | ORAL_TABLET | Freq: Every day | ORAL | Status: DC
  Administered 2023-08-16: 1 via ORAL

## 2023-08-15 SURGICAL SUPPLY — 65 items
APL PRP STRL LF DISP 70% ISPRP (MISCELLANEOUS) ×1
BLADE SAW 90X13X1.19 OSCILLAT (BLADE) ×1 IMPLANT
BLADE SAW SAG 25X90X1.19 (BLADE) ×1 IMPLANT
BLADE SURG SZ20 CARB STEEL (BLADE) ×1 IMPLANT
BNDG CMPR STD VLCR NS LF 5.8X6 (GAUZE/BANDAGES/DRESSINGS) ×1
BNDG ELASTIC 6X5.8 VLCR NS LF (GAUZE/BANDAGES/DRESSINGS) ×1 IMPLANT
CEMENT BONE R 1X40 (Cement) ×2 IMPLANT
CEMENT VACUUM MIXING SYSTEM (MISCELLANEOUS) ×1 IMPLANT
CHLORAPREP W/TINT 26 (MISCELLANEOUS) ×1 IMPLANT
COMP FEM CEMT PERS NARROW 6 RT (Joint) ×1 IMPLANT
COMP TIB CMT PS D 0D RT (Joint) ×1 IMPLANT
COMPONENT FEM CMT PERS NRW 6RT (Joint) IMPLANT
COMPONENT TIB CMT PS D 0D RT (Joint) IMPLANT
COOLER POLAR GLACIER W/PUMP (MISCELLANEOUS) ×1 IMPLANT
COVER MAYO STAND STRL (DRAPES) ×1 IMPLANT
CUFF TOURN SGL QUICK 24 (TOURNIQUET CUFF)
CUFF TOURN SGL QUICK 34 (TOURNIQUET CUFF)
CUFF TRNQT CYL 24X4X16.5-23 (TOURNIQUET CUFF) IMPLANT
CUFF TRNQT CYL 34X4.125X (TOURNIQUET CUFF) IMPLANT
DRAPE IMP U-DRAPE 54X76 (DRAPES) ×1 IMPLANT
DRAPE SHEET LG 3/4 BI-LAMINATE (DRAPES) ×1 IMPLANT
DRAPE U-SHAPE 47X51 STRL (DRAPES) ×1 IMPLANT
DRSG MEPILEX SACRM 8.7X9.8 (GAUZE/BANDAGES/DRESSINGS) IMPLANT
DRSG OPSITE POSTOP 4X10 (GAUZE/BANDAGES/DRESSINGS) ×1 IMPLANT
DRSG OPSITE POSTOP 4X8 (GAUZE/BANDAGES/DRESSINGS) ×1 IMPLANT
ELECT CAUTERY BLADE 6.4 (BLADE) ×1 IMPLANT
ELECT REM PT RETURN 9FT ADLT (ELECTROSURGICAL) ×1
ELECTRODE REM PT RTRN 9FT ADLT (ELECTROSURGICAL) ×1 IMPLANT
GAUZE XEROFORM 1X8 LF (GAUZE/BANDAGES/DRESSINGS) ×1 IMPLANT
GLOVE BIO SURGEON STRL SZ7.5 (GLOVE) ×4 IMPLANT
GLOVE BIO SURGEON STRL SZ8 (GLOVE) ×4 IMPLANT
GLOVE BIOGEL PI IND STRL 8 (GLOVE) ×1 IMPLANT
GLOVE INDICATOR 8.0 STRL GRN (GLOVE) ×1 IMPLANT
GOWN STRL REUS W/ TWL LRG LVL3 (GOWN DISPOSABLE) ×1 IMPLANT
GOWN STRL REUS W/ TWL XL LVL3 (GOWN DISPOSABLE) ×1 IMPLANT
GOWN STRL REUS W/TWL LRG LVL3 (GOWN DISPOSABLE) ×1
GOWN STRL REUS W/TWL XL LVL3 (GOWN DISPOSABLE) ×1
HANDLE YANKAUER SUCT OPEN TIP (MISCELLANEOUS) ×1 IMPLANT
HOOD PEEL AWAY T7 (MISCELLANEOUS) ×3 IMPLANT
INSERT TIB ASF PERS CD6-7 RT (Insert) IMPLANT
IV NS IRRIG 3000ML ARTHROMATIC (IV SOLUTION) ×1 IMPLANT
KIT TURNOVER KIT A (KITS) ×1 IMPLANT
MANIFOLD NEPTUNE II (INSTRUMENTS) ×1 IMPLANT
NDL SPNL 20GX3.5 QUINCKE YW (NEEDLE) ×1 IMPLANT
NEEDLE SPNL 20GX3.5 QUINCKE YW (NEEDLE) ×1 IMPLANT
NS IRRIG 1000ML POUR BTL (IV SOLUTION) ×1 IMPLANT
PACK TOTAL KNEE (MISCELLANEOUS) ×1 IMPLANT
PAD WRAPON POLAR KNEE (MISCELLANEOUS) ×1 IMPLANT
PENCIL SMOKE EVACUATOR (MISCELLANEOUS) ×1 IMPLANT
PIN DRILL HDLS TROCAR 75 4PK (PIN) IMPLANT
PULSAVAC PLUS IRRIG FAN TIP (DISPOSABLE) ×1
SCREW FEMALE HEX FIX 25X2.5 (ORTHOPEDIC DISPOSABLE SUPPLIES) IMPLANT
STAPLER SKIN PROX 35W (STAPLE) ×1 IMPLANT
STEM POLY PAT PLY 32M KNEE (Knees) IMPLANT
SUCTION TUBE FRAZIER 10FR DISP (SUCTIONS) ×1 IMPLANT
SUT VIC AB 0 CT1 36 (SUTURE) ×3 IMPLANT
SUT VIC AB 2-0 CT1 27 (SUTURE) ×4
SUT VIC AB 2-0 CT1 TAPERPNT 27 (SUTURE) ×3 IMPLANT
SYR 10ML LL (SYRINGE) ×1 IMPLANT
SYR 20ML LL LF (SYRINGE) ×1 IMPLANT
SYR 30ML LL (SYRINGE) IMPLANT
TIP FAN IRRIG PULSAVAC PLUS (DISPOSABLE) ×1 IMPLANT
TRAP FLUID SMOKE EVACUATOR (MISCELLANEOUS) ×2 IMPLANT
WATER STERILE IRR 500ML POUR (IV SOLUTION) ×1 IMPLANT
WRAPON POLAR PAD KNEE (MISCELLANEOUS) ×1

## 2023-08-15 NOTE — Anesthesia Preprocedure Evaluation (Signed)
Anesthesia Evaluation  Patient identified by MRN, date of birth, ID band Patient awake    Reviewed: Allergy & Precautions, NPO status , Patient's Chart, lab work & pertinent test results  History of Anesthesia Complications Negative for: history of anesthetic complications  Airway Mallampati: III  TM Distance: >3 FB Neck ROM: full    Dental no notable dental hx.    Pulmonary asthma    Pulmonary exam normal        Cardiovascular hypertension, + CAD  Normal cardiovascular exam     Neuro/Psych  Headaches PSYCHIATRIC DISORDERS Anxiety        GI/Hepatic Neg liver ROS,GERD  Controlled,,  Endo/Other  diabetes (GLP last dose 9/29), Type 2    Renal/GU Renal disease     Musculoskeletal  (+) Arthritis ,    Abdominal   Peds  Hematology  (+) Blood dyscrasia, anemia   Anesthesia Other Findings Past Medical History: No date: Anemia     Comment:  iron deficiency and b12 deficiency No date: Anxiety No date: Aortic atherosclerosis (HCC) No date: Arthritis No date: Asthma No date: Diabetes mellitus without complication (HCC) No date: Fasciitis     Comment:  left foot No date: GERD (gastroesophageal reflux disease) No date: History of kidney stones No date: Hypercholesterolemia No date: Hypertension No date: Migraines     Comment:  MIGRAINES HAVE IMPROVED SINCE RECEIVING IRON No date: Pneumonia No date: Primary osteoarthritis of right knee No date: Tachycardia  Past Surgical History: 2004: CHOLECYSTECTOMY 02/2018: COLONOSCOPY WITH ESOPHAGOGASTRODUODENOSCOPY (EGD) 10/16/2020: ESOPHAGOGASTRODUODENOSCOPY; N/A     Comment:  Procedure: ESOPHAGOGASTRODUODENOSCOPY (EGD);  Surgeon:               Regis Bill, MD;  Location: [facility] ENDOSCOPY;                Service: Endoscopy;  Laterality: N/A; 06/06/2016: ESOPHAGOGASTRODUODENOSCOPY (EGD) WITH PROPOFOL; N/A     Comment:  Procedure: ESOPHAGOGASTRODUODENOSCOPY (EGD) WITH                PROPOFOL;  Surgeon: Scot Jun, MD;  Location: [facility]              ENDOSCOPY;  Service: Endoscopy;  Laterality: N/A; 2010: EXTRACORPOREAL SHOCK WAVE LITHOTRIPSY 2013: JOINT REPLACEMENT     Comment:  LT TKR 06/06/2016: SAVORY DILATION; N/A     Comment:  Procedure: SAVORY DILATION;  Surgeon: Scot Jun,               MD;  Location: [facility] ENDOSCOPY;  Service: Endoscopy;                Laterality: N/A; 02/2018: SAVORY DILATION 05/24/2018: SHOULDER ARTHROSCOPY WITH OPEN ROTATOR CUFF REPAIR; Right     Comment:  Procedure: right shoulder arthroscopy, extensive               arthroscopic debridement, decompression, open rotator               cuff repair, biceps tenodesis;  Surgeon: Christena Flake,               MD;  Location: [facility] ORS;  Service: Orthopedics;                Laterality: Right; 1978: TONSILLECTOMY     Reproductive/Obstetrics negative OB ROS                             Anesthesia Physical Anesthesia Plan  ASA:  2  Anesthesia Plan: Spinal   Post-op Pain Management: Regional block*   Induction: Intravenous  PONV Risk Score and Plan: 2 and Propofol infusion, TIVA and Treatment may vary due to age or medical condition  Airway Management Planned: Natural Airway and Nasal Cannula  Additional Equipment:   Intra-op Plan:   Post-operative Plan:   Informed Consent: I have reviewed the patients History and Physical, chart, labs and discussed the procedure including the risks, benefits and alternatives for the proposed anesthesia with the patient or authorized representative who has indicated his/her understanding and acceptance.     Dental Advisory Given  Plan Discussed with: Anesthesiologist, CRNA and Surgeon  Anesthesia Plan Comments: (Patient reports no bleeding problems and no anticoagulant use.  Plan for spinal with backup GA  Patient consented for risks of anesthesia including but not limited to:  - adverse  reactions to medications - damage to eyes, teeth, lips or other oral mucosa - nerve damage due to positioning  - risk of bleeding, infection and or nerve damage from spinal that could lead to paralysis - risk of headache or failed spinal - damage to teeth, lips or other oral mucosa - sore throat or hoarseness - damage to heart, brain, nerves, lungs, other parts of body or loss of life  Patient voiced understanding and assent.)       Anesthesia Quick Evaluation

## 2023-08-15 NOTE — Discharge Instructions (Addendum)
Orthopedic discharge instructions: May shower with intact OpSite dressing. Apply ice frequently to knee or use Polar Care. Start Eliquis 1 tablet (2.5 mg) twice daily on Wednesday, 08/16/2023, for 2 weeks, then take aspirin 325 mg twice daily for 4 weeks. Take pain medication as prescribed when needed.  May supplement with ES Tylenol if necessary. May weight-bear as tolerated on right leg - use walker for balance and support. Follow-up in 10-14 days or as scheduled.  AMBULATORY SURGERY  DISCHARGE INSTRUCTIONS   The drugs that you were given will stay in your system until tomorrow so for the next 24 hours you should not:  Drive an automobile Make any legal decisions Drink any alcoholic beverage   You may resume regular meals tomorrow.  Today it is better to start with liquids and gradually work up to solid foods.  You may eat anything you prefer, but it is better to start with liquids, then soup and crackers, and gradually work up to solid foods.   Please notify your doctor immediately if you have any unusual bleeding, trouble breathing, redness and pain at the surgery site, drainage, fever, or pain not relieved by medication.    Additional Instructions:        Please contact your physician with any problems or Same Day Surgery at [phone], Monday through Friday 6 am to 4 pm, or [place] [facility] number at [phone].  POLAR CARE INFORMATION  MassAdvertisement.it  How to use [facility] Polar Care [facility] Therapy System?  YouTube   ShippingScam.co.uk  OPERATING INSTRUCTIONS  Start the product With dry hands, connect the transformer to the electrical connection located on the top of the cooler. Next, plug the transformer into an appropriate electrical outlet. The unit will automatically start running at this point.  To stop the pump, disconnect electrical power.  Unplug to stop the product when not in use. Unplugging the Polar Care unit  turns it off. Always unplug immediately after use. Never leave it plugged in while unattended. Remove pad.    FIRST ADD WATER TO FILL LINE, THEN ICE---Replace ice when existing ice is almost melted  1 Discuss Treatment with your Licensed Health Care Practitioner and Use Only as Prescribed 2 Apply Insulation Barrier & Cold Therapy Pad 3 Check for Moisture 4 Inspect Skin Regularly  Tips and Trouble Shooting Usage Tips 1. Use cubed or chunked ice for optimal performance. 2. It is recommended to drain the Pad between uses. To drain the pad, hold the Pad upright with the hose pointed toward the ground. Depress the black plunger and allow water to drain out. 3. You may disconnect the Pad from the unit without removing the pad from the affected area by depressing the silver tabs on the hose coupling and gently pulling the hoses apart. The Pad and unit will seal itself and will not leak. Note: Some dripping during release is normal. 4. DO NOT RUN PUMP WITHOUT WATER! The pump in this unit is designed to run with water. Running the unit without water will cause permanent damage to the pump. 5. Unplug unit before removing lid.  TROUBLESHOOTING GUIDE Pump not running, Water not flowing to the pad, Pad is not getting cold 1. Make sure the transformer is plugged into the wall outlet. 2. Confirm that the ice and water are filled to the indicated levels. 3. Make sure there are no kinks in the pad. 4. Gently pull on the blue tube to make sure the tube/pad junction is straight. 5.  Remove the pad from the treatment site and ll it while the pad is lying at; then reapply. 6. Confirm that the pad couplings are securely attached to the unit. Listen for the double clicks (Figure 1) to confirm the pad couplings are securely attached.  Leaks    Note: Some condensation on the lines, controller, and pads is unavoidable, especially in warmer climates. 1. If using a Breg Polar Care Cold Therapy unit with a detachable  Cold Therapy Pad, and a leak exists (other than condensation on the lines) disconnect the pad couplings. Make sure the silver tabs on the couplings are depressed before reconnecting the pad to the pump hose; then confirm both sides of the coupling are properly clicked in. 2. If the coupling continues to leak or a leak is detected in the pad itself, stop using it and call [employer] Customer Care at [phone].  Cleaning After use, empty and dry the unit with a soft cloth. Warm water and mild detergent may be used occasionally to clean the pump and tubes.  WARNING: The Polar Care Cube can be cold enough to cause serious injury, including full skin necrosis. Follow these Operating Instructions, and carefully read the Product Insert (see pouch on side of unit) and the Cold Therapy Pad Fitting Instructions (provided with each Cold Therapy Pad) prior to use.

## 2023-08-15 NOTE — Anesthesia Procedure Notes (Signed)
Procedure Name: MAC Date/Time: 08/15/2023 10:50 AM  Performed by: Carolynne Edouard, RNPre-anesthesia Checklist: Patient identified, Emergency Drugs available, Suction available and Patient being monitored Patient Re-evaluated:Patient Re-evaluated prior to induction Oxygen Delivery Method: Simple face mask

## 2023-08-15 NOTE — Progress Notes (Signed)
PT Cancellation Note  Patient Details Name: Andrea Zhang MRN: [id number] DOB: 05-02-1961   Cancelled Treatment:    Reason Eval/Treat Not Completed: Patient not medically ready: Per nursing pt lacking return of strength to her RLE and requested PT be held this date.  Will attempt to see pt at a future date/time as medically appropriate.     Ovidio Hanger PT, DPT 08/15/23, 4:07 PM

## 2023-08-15 NOTE — H&P (Signed)
History of Present Illness: Andrea Zhang is a 62 y.o.female who is here for history and physical for an upcoming right total knee arthroplasty to be done by Dr. Joice Lofts on August 15, 2023. The patient saw Dr. Joice Lofts April 21, 2023. The symptoms began many years ago and developed without any specific cause or injury. She is status post a left total knee arthroplasty performed by Dr. Gavin Potters over 10 years ago from which she has done quite well. She has been followed by Dedra Skeens, PA-C, over the past 5 years or so for her right knee symptoms, receiving steroid injections every so often. Her most recent injection was performed 2 weeks ago. At that time, she was advised to discuss total knee replacement surgery with me, so she presents at this time for further evaluation and treatment. She reports 1/10 pain in her knee on today's visit and notes that the injection received several weeks ago has been quite beneficial. When present, the pain is located along the medial aspect of the knee. The pain is described as aching, dull, stabbing, and throbbing. The symptoms are aggravated with normal daily activities, with sleeping, using stairs, at higher levels of activity, rising from a chair, walking, standing, and activity in general. She is not using any assistive devices for ambulation at this time, but does have difficulty reciprocating stairs. She also describes an occasional catching sensation in her knee which occasionally will make her feel as though her knee wants to give way. She has associated swelling and deformity. She has tried over-the-counter medications, anti-inflammatories, bracing, and steroid injections with temporary partial relief of her symptoms. She denies any recent injury to her knee and denies any numbness or paresthesias down her leg to her foot. She has been working full-time in a sedentary position and tolerating this well. The patient is a diabetic.  Current Outpatient Medications:  albuterol  90 mcg/actuation inhaler INHALE 2 INHALATIONS INTO THE LUNGS EVERY 4 HOURS AS NEEDED FOR WHEEZING 3 each 0  aspirin 81 MG EC tablet Take 81 mg by mouth daily.  atenoloL (TENORMIN) 50 MG tablet Take 0.5 tablets (25 mg total) by mouth once daily 45 tablet 1  atogepant 60 mg Tab Take 60 mg by mouth once daily 30 tablet 5  blood-glucose sensor (FREESTYLE LIBRE 3 SENSOR) USE AS DIRECTED EVERY 14 DAYS FOR GLUCOSE MONITORING 6 each 1  cholecalciferol (VITAMIN D3) 2,000 unit tablet Take 2,000 Units by mouth once daily.  conjugated estrogens (PREMARIN) 0.625 mg/gram vaginal cream Place 0.5 g vaginally 3 (three) times a week Apply small amount to vagina 3 times a week 30 g 1  cyclobenzaprine (FLEXERIL) 10 MG tablet Take 1 tablet (10 mg total) by mouth 3 (three) times daily 270 tablet 1  gabapentin (NEURONTIN) 800 MG tablet Take 1 tablet (800 mg total) by mouth at bedtime 90 tablet 1  meloxicam (MOBIC) 15 MG tablet take 1 tablet daily 90 tablet 1  mirtazapine (REMERON) 15 MG tablet Take 1 tablet (15 mg total) by mouth at bedtime (Patient taking differently: Take 7.5 mg by mouth at bedtime) 90 tablet 1  multivitamin tablet Take 1 tablet by mouth once daily  omeprazole (PRILOSEC) 40 MG DR capsule TAKE 1 CAPSULE TWICE A DAY BEFORE MEALS 180 capsule 1  OZEMPIC 2 mg/dose (8 mg/3 mL) pen injector INJECT 2 MG UNDER THE SKIN WEEKLY 9 mL 1  PREVIDENT 5000 BOOSTER PLUS 1.1 % dental paste Place onto teeth at bedtime  promethazine (PHENERGAN) 25 MG tablet Take  1 tablet (25 mg total) by mouth every 8 (eight) hours as needed for Nausea 15 tablet 1  simvastatin (ZOCOR) 10 MG tablet take 1 tablet at bedtime 90 tablet 1  spironolactone (ALDACTONE) 25 MG tablet Take 1 tablet (25 mg total) by mouth once daily 90 tablet 1  SUMAtriptan (IMITREX) 100 MG tablet Take 1 tablet (100 mg total) by mouth as directed for Migraine MAY TAKE A SECOND DOSE AFTER 2 HOURS IF NEEDED 10 tablet 3   Allergies:  Flexor [Orphenadrine Citrate] Rash   Iodinated Contrast Media Other (See Comments)  Patient could hear things but not responsive Becomes 'unresponsive' to ORAL and IV DYE BETADINE ON THE SKIN IS OKAY Becomes 'unresponsive' to ORAL and IV DYE BETADINE ON THE SKIN IS OKAY unresponsive Patient could hear things but not responsive Becomes 'unresponsive' to ORAL and IV DYE BETADINE ON THE SKIN IS OKAY  Iodine Other (See Comments)  Becomes 'unresponsive' to ORAL and IV DYE BETADINE ON THE SKIN IS OKAY unresponsive Patient could hear things but not responsive Becomes 'unresponsive' to ORAL and IV DYE BETADINE ON THE SKIN IS OKAY Patient could hear things but not responsive Becomes 'unresponsive' to ORAL and IV DYE BETADINE ON THE SKIN IS OKAY Becomes 'unresponsive' to ORAL and IV DYE BETADINE ON THE SKIN IS OKAY unresponsive Patient could hear things but not responsiveBecomes 'unresponsive' to ORAL and IV DYE BETADINE ON THE SKIN IS OKAY  Zithromax [Azithromycin] Abdominal Pain  Maxalt [Rizatriptan] Palpitations  Voltaren [Diclofenac Sodium] Rash  Zomig [Zolmitriptan] Palpitations  Cephalexin Nausea  Ibuprofen Itching  Zyrtec [Cetirizine] Headache  Rizatriptan Benzoate Other ('Heart Races)   Past Medical History:  Allergic rhinitis  Asthma (HHS-HCC)  B12 deficiency 09/2017  Rec OTC supplementation  Baker's cyst, ruptured 01/10  Diabetes mellitus, type 2 (CMS/HHS-HCC)  Diverticulosis  Dysphagia  due to reflux; esophagel dilation  Esophageal stricture  s/p dilation 2-3 times  Fatty liver disease, nonalcoholic  GERD (gastroesophageal reflux disease)  History of chicken pox  Hypercalcemia  Hyperlipidemia  Hypertension  Iron deficiency anemia  Microalbuminuric diabetic nephropathy (CMS/HHS-HCC) 09/2017  Migraines since age 49  Nephrolithiasis 2010, lithotripsy  Osteoarthritis (right shoulder, bilateral thumbs, bilat knees)  Osteoarthritis of multiple joints  Plantar fasciitis of left foot  Polio (HHS-HCC)  - age 60  Restless leg syndrome  Sebaceous cyst  Head, right side  Tachycardia  Vitamin D deficiency   Past Surgical History:  dhe treatment 2002  KNEE ARTHROSCOPY Left 10/2009 (partial meniscus repair)  COLONOSCOPY 11/11/2010 (Divertiuclosis: CBF 11/2020)  REPLACEMENT TOTAL KNEE Left 2013  ARTHROSCOPIC REPAIR ACL 02/2012  attempted, could not be completed due to scarring  EGD 11/21/2013, 11/11/2010  EGD 06/06/2016 (Gastritis: No repeat per RTE)  EGD 03/15/2018 (Gastritis: No repeat per RTE)  COLONOSCOPY 03/15/2018  Diverticulosis: CBF 03/2028  Extensive arthroscopic debridement, arthroscopic subacromial decompression, mini-open rotator cuff repair, and mini-open biceps tenodesis, right shoulder. Right 05/24/2018 (Dr. Joice Lofts)  EGD 10/16/2020 (Negative EGD biopsies/Repeat as needed/CTL)  CHOLECYSTECTOMY  ESOPHAGEAL DILATION - multiple  LITHOTRIPSY  TONSILLECTOMY  TUBAL LIGATION   Family History:  COPD Mother  Heart disease Mother  unknown heart issues  Asthma Mother  Schizophrenia Mother  Hemophilia Father  Prostate cancer Father  Heart disease Father  Epilepsy Sister  Stroke Sister  24 due to medication  Alcohol abuse Sister  High blood pressure (Hypertension) Sister  Epilepsy Brother  died of seizure  Lupus Son Jomarie Longs  Gallbladder disease Son Jomarie Longs  cholescystectomy  Other Son Jomarie Longs  GSW to the head  Seizures Son Jomarie Longs  Blindness Son Jomarie Longs  Legally blind   Social History:   Socioeconomic History:  Marital status: Married  Number of children: 4  Occupational History  Occupation: IT Help Desk  Comment: [employer]--works from [employer]  Tobacco Use  Smoking status: Never  Smokeless tobacco: Never  Vaping Use  Vaping status: Never Used  Substance and Sexual Activity  Alcohol use: No  Alcohol/week: 0.0 standard drinks of alcohol  Drug use: No  Sexual activity: Yes  Partners: Male  Birth control/protection: Surgical  Social History Narrative  Adult  son, Jomarie Longs, lives w/ them as he needs care assistance following GSW to the head several yrs ago & is legally blind. Son doesn't get out & get much socialization.   Review of Systems:  A comprehensive 14 point ROS was performed, reviewed, and the pertinent orthopaedic findings are documented in the HPI.  Physical Exam: Vitals:  08/09/23 1037  BP: 116/74  Weight: 78.7 kg (173 lb 6.4 oz)  Height: 152.4 cm (5')  PainSc: 8  PainLoc: Knee   General/Constitutional: The patient appears to be well-nourished, well-developed, and in no acute distress. Neuro/Psych: Normal mood and affect, oriented to person, place and time. Eyes: Non-icteric. Pupils are equal, round, and reactive to light, and exhibit synchronous movement. Lymphatic: No palpable adenopathy. Respiratory: No wheezes and Non-labored breathing Cardiovascular: No edema, swelling or tenderness, except as noted in detailed exam. Vascular: No edema, swelling or tenderness, except as noted in detailed exam. Integumentary: No impressive skin lesions present, except as noted in detailed exam. Musculoskeletal: Unremarkable, except as noted in detailed exam.  Heart: Examination of the heart reveals regular, rate, and rhythm. There is no murmur noted on ascultation. There is a normal apical pulse.  Lungs: Lungs are clear to auscultation. There is no wheeze, rhonchi, or crackles. There is normal expansion of bilateral chest walls.   Right knee exam: GAIT: mild limp and uses no assistive devices. ALIGNMENT: mild varus SKIN: unremarkable SWELLING: mild EFFUSION: small WARMTH: no warmth TENDERNESS: moderate over the medial joint line, but only minimal lateral joint line tenderness and no peripatellar tenderness ROM: 0 to 125 degrees with mild discomfort at maximal flexion. McMURRAY'S: positive PATELLOFEMORAL: normal tracking with no peri-patellar tenderness and negative apprehension sign CREPITUS: no LACHMAN'S: negative PIVOT SHIFT:  Not evaluated ANTERIOR DRAWER: negative POSTERIOR DRAWER: negative VARUS/VALGUS: positive pseudolaxity to varus stressing  She is neurovascularly intact to the right lower extremity and foot.  Knee Imaging: AP weightbearing of both knees, as well as lateral and merchant views of the right knee are obtained by Dr. Joice Lofts. These films demonstrate severe degenerative changes, primarily involving the medial compartment with 100% Medial joint space narrowing. Lesser degenerative changes of the patellofemoral more so than the lateral compartment are noted. Overall alignment is moderate varus. No fractures, lytic lesions, or abnormal calcifications are noted.  Assessment:  Primary osteoarthritis of right knee.  Plan: The treatment options were discussed with the patient and her son. In addition, patient educational materials were provided regarding the diagnosis and treatment options. The patient is quite frustrated by her symptoms and functional limitations, and it is ready to consider more aggressive treatment options. Therefore, I have recommended a surgical procedure, specifically a right total knee arthroplasty. The procedure was discussed with the patient, as were the potential risks (including bleeding, infection, nerve and/or blood vessel injury, persistent or recurrent pain, loosening and/or failure of the components, dislocation, need for further surgery, blood clots,  strokes, heart attacks and/or arhythmias, pneumonia, etc.) and benefits. The patient states his/her understanding and wishes to proceed. All of the patient's questions and concerns were answered. She can call any time with further concerns. She will return to work without restrictions. She will follow up post-surgery, routine.    H&P reviewed and patient re-examined. No changes.

## 2023-08-15 NOTE — Anesthesia Procedure Notes (Signed)
Spinal  Patient location during procedure: OR Start time: 08/15/2023 10:42 AM End time: 08/15/2023 10:45 AM Reason for block: surgical anesthesia Staffing Performed: other anesthesia staff  Anesthesiologist: Louie Boston, MD Resident/CRNA: Berniece Pap, CRNA Other anesthesia staff: Carolynne Edouard, RN Performed by: Carolynne Edouard, RN Authorized by: Louie Boston, MD   Preanesthetic Checklist Completed: patient identified, IV checked, site marked, risks and benefits discussed, surgical consent, monitors and equipment checked, pre-op evaluation and timeout performed Spinal Block Patient position: sitting Prep: ChloraPrep Patient monitoring: heart rate, cardiac monitor, continuous pulse ox and blood pressure Approach: midline Location: L3-4 Injection technique: single-shot Needle Needle type: Pencan and Introducer  Needle gauge: 24 G Needle length: 9 cm Assessment Sensory level: T10 Events: CSF return

## 2023-08-15 NOTE — Op Note (Signed)
08/15/2023  1:01 PM  Patient:   Andrea Zhang  Pre-Op Diagnosis:   Degenerative joint disease, right knee.  Post-Op Diagnosis:   Same  Procedure:   Right TKA using all-cemented Zimmer Persona system with a #6 narrow PCR femur, a(n) D-sized  tibial tray with an  11 mm medial congruent E-poly insert, and a 32 x 8.5 mm all-poly 3-pegged domed patella.  Surgeon:   Maryagnes Amos, MD  Assistant:   Horris Latino, PA-C   Anesthesia:   Spinal  Findings:   As above  Complications:   None  EBL:   5 cc  Fluids:   800 cc crystalloid  UOP:   None  TT:   90 minutes at 300 mmHg  Drains:   None  Closure:   Staples  Implants:   As above  Brief Clinical Note:   The patient is a 62 year old female with a long history of progressively worsening right knee pain. The patient's symptoms have progressed despite medications, activity modification, injections, etc. The patient's history and examination were consistent with advanced degenerative joint disease of the right knee confirmed by plain radiographs. The patient presents at this time for a right total knee arthroplasty.  Procedure:   The patient was brought into the operating room. After adequate spinal anesthesia was obtained, the patient was repositioned in the supine position on the operating room table. The right lower extremity was prepped with ChloraPrep solution and draped sterilely. Preoperative antibiotics were administered. A timeout was performed to verify the appropriate surgical site before the limb was exsanguinated with an Esmarch and the tourniquet inflated to 300 mmHg.   A standard anterior approach to the knee was made through an approximately 6-7 inch incision. The incision was carried down through the subcutaneous tissues to expose superficial retinaculum. This was split the length of the incision and the medial flap elevated sufficiently to expose the medial retinaculum. The medial retinaculum was incised, leaving a 3-4 mm  cuff of tissue on the patella. This was extended distally along the medial border of the patellar tendon and proximally through the medial third of the quadriceps tendon. A subtotal fat pad excision was performed before the soft tissues were elevated off the anteromedial and anterolateral aspects of the proximal tibia to the level of the collateral ligaments. The anterior portions of the medial and lateral menisci were removed, as was the anterior cruciate ligament. With the knee flexed to 90, the external tibial guide was positioned and the appropriate proximal tibial cut made. This piece was taken to the back table where it was measured and found to be optimally replicated by a(n) D-sized component.  Attention was directed to the distal femur. The intramedullary canal was accessed through a 3/8" drill hole. The intramedullary guide was inserted and positioned in order to obtain a neutral flexion gap. The distal cutting block was placed at 5 of valgus alignment. Using the +0 slot, the distal cut was made. The distal femur was measured and found to be optimally replicated by the #6 component. The #6 4-in-1 cutting block was positioned and first the posterior, then the posterior chamfer, the anterior chamfer, and finally the anterior cuts were made after verifying that the anterior cortex would not be notched.   At this point, the posterior portions medial and lateral menisci were removed. A trial reduction was performed using the appropriate femoral and tibial components with first the 10 mm and then the 11 mm insert. The 11 mm insert demonstrated excellent  stability to varus and valgus stressing both in flexion and extension while permitting full extension. Patellar tracking was assessed and found to be excellent. Therefore, the tibial trial position was marked on the proximal tibia. The patella thickness was measured and found to be 20 mm. Therefore, the appropriate cut was made. The patellar surface was  measured and found to be optimally replicated by the 32 mm component. The three peg holes were drilled in place before the trial button was inserted. Patella tracking was assessed and found to be excellent, passing the "no thumb test". The lug holes were drilled into the distal femur before the trial component was removed.  The tibial tray was repositioned before the keel was created using the appropriate tower, reamer, and punch.  The bony surfaces were prepared for cementing by irrigating them thoroughly with sterile saline solution via the jet lavage system. A bone plug was fashioned from some of the bone that had been removed previously and used to plug the distal femoral canal. In addition, a "cocktail" of 20 cc of Exparel, 30 cc of 0.5% Sensorcaine, 2 cc of Kenalog 40 (80 mg), and 30 mg of Toradol diluted out to 90 cc with normal saline was injected into the postero-medial and postero-lateral aspects of the knee, the medial and lateral gutter regions, and the peri-incisional tissues to help with postoperative analgesia. Meanwhile, the cement was being mixed on the back table.   When the cement was ready, the tibial tray was cemented in first. The excess cement was removed using Personal assistant. Next, the femoral component was impacted into place. Again, the excess cement was removed using Personal assistant. The 11 mm trial insert was positioned and the knee brought into extension while the cement hardened. Finally, the patella was cemented into place and secured using the patellar clamp. Again, the excess cement was removed using Personal assistant. Once the cement had hardened, the knee was placed through a range of motion with the findings as described above. Therefore, the trial insert was removed and, after verifying that no cement had been retained posteriorly, the permanent 11 mm medial congruent E-polyethylene insert was snapped into place with care taken to ensure appropriate locking of the insert.  Again the knee was placed through a range of motion with the findings as described above.  The wound was copiously irrigated with sterile saline solution using the jet lavage system before the quadriceps tendon and retinacular layer were reapproximated using #0 Vicryl interrupted sutures. The superficial retinacular layer also was closed using a running #0 Vicryl suture. The subcutaneous tissues were closed in several layers using 2-0 Vicryl interrupted sutures. The skin was closed using staples. A sterile honeycomb dressing was applied to the skin before the leg was wrapped with an Ace wrap to accommodate the Polar Care device. The patient was then awakened and returned to the recovery room in satisfactory condition after tolerating the procedure well.

## 2023-08-15 NOTE — Progress Notes (Signed)
Patient is not able to walk the distance required to go the bathroom, or he/she is unable to safely negotiate stairs required to access the bathroom.  A 3in1 BSC will alleviate this problem  

## 2023-08-15 NOTE — Plan of Care (Signed)
  Problem: Pain Managment: Goal: General experience of comfort will improve Outcome: Progressing   Problem: Safety: Goal: Ability to remain free from injury will improve Outcome: Progressing   Problem: Skin Integrity: Goal: Risk for impaired skin integrity will decrease Outcome: Progressing   

## 2023-08-15 NOTE — Transfer of Care (Signed)
Immediate Anesthesia Transfer of Care Note  Patient: Andrea Zhang  Procedure(s) Performed: TOTAL KNEE ARTHROPLASTY (Right: Knee)  Patient Location: PACU  Anesthesia Type:MAC and Spinal  Level of Consciousness: drowsy and responds to stimulation  Airway & Oxygen Therapy: Patient Spontanous Breathing and Patient connected to face mask oxygen  Post-op Assessment: Report given to RN and Post -op Vital signs reviewed and stable  Post vital signs: Reviewed and stable  Last Vitals:  Vitals Value Taken Time  BP 80/61 08/15/23 1300  Temp    Pulse 81 08/15/23 1301  Resp 23 08/15/23 1301  SpO2 99 % 08/15/23 1301  Vitals shown include unfiled device data.  Last Pain:  Vitals:   08/15/23 0908  TempSrc: Temporal  PainSc: 3          Complications: No notable events documented.

## 2023-08-16 ENCOUNTER — Encounter: Payer: Self-pay | Admitting: Surgery

## 2023-08-16 DIAGNOSIS — M1711 Unilateral primary osteoarthritis, right knee: Secondary | ICD-10-CM | POA: Diagnosis not present

## 2023-08-16 MED ORDER — ACETAMINOPHEN 500 MG PO TABS
ORAL_TABLET | ORAL | Status: AC
  Filled 2023-08-16: qty 2

## 2023-08-16 MED ORDER — FERROUS SULFATE 325 (65 FE) MG PO TABS
ORAL_TABLET | ORAL | Status: AC
  Filled 2023-08-16: qty 1

## 2023-08-16 MED ORDER — APIXABAN 2.5 MG PO TABS
ORAL_TABLET | ORAL | Status: AC
  Filled 2023-08-16: qty 1

## 2023-08-16 MED ORDER — OXYCODONE HCL 5 MG PO TABS
ORAL_TABLET | ORAL | Status: AC
  Filled 2023-08-16: qty 1

## 2023-08-16 MED ORDER — LORATADINE 10 MG PO TABS
ORAL_TABLET | ORAL | Status: AC
  Filled 2023-08-16: qty 1

## 2023-08-16 MED ORDER — ATENOLOL 50 MG PO TABS
ORAL_TABLET | ORAL | Status: AC
  Filled 2023-08-16: qty 1

## 2023-08-16 MED ORDER — DOCUSATE SODIUM 100 MG PO CAPS
ORAL_CAPSULE | ORAL | Status: AC
  Filled 2023-08-16: qty 1

## 2023-08-16 MED ORDER — ONDANSETRON HCL 4 MG PO TABS: 4.0000 mg | ORAL_TABLET | Freq: Four times a day (QID) | ORAL | 0 refills | Status: AC | PRN

## 2023-08-16 MED ORDER — PANTOPRAZOLE SODIUM 40 MG PO TBEC
DELAYED_RELEASE_TABLET | ORAL | Status: AC
  Filled 2023-08-16: qty 1

## 2023-08-16 MED ORDER — OXYCODONE HCL 5 MG PO TABS
5.0000 mg | ORAL_TABLET | ORAL | 0 refills | Status: AC | PRN
Start: 2023-08-16 — End: ?

## 2023-08-16 MED ORDER — ADULT MULTIVITAMIN W/MINERALS CH
ORAL_TABLET | ORAL | Status: AC
  Filled 2023-08-16: qty 1

## 2023-08-16 MED ORDER — APIXABAN 2.5 MG PO TABS: 2.5000 mg | ORAL_TABLET | Freq: Two times a day (BID) | ORAL | 0 refills | Status: DC

## 2023-08-16 MED ORDER — VITAMIN D 25 MCG (1000 UNIT) PO TABS
ORAL_TABLET | ORAL | Status: AC
  Filled 2023-08-16: qty 1

## 2023-08-16 NOTE — Discharge Summary (Signed)
Physician Discharge Summary  Patient ID: Andrea Zhang MRN: [id number] DOB/AGE: 12-17-1960 62 y.o.  Admit date: 08/15/2023 Discharge date: 08/16/2023  Admission Diagnoses:  Status post total knee replacement using cement, right [Z96.651] Primary osteoarthritis of the right knee  Discharge Diagnoses: Patient Active Problem List   Diagnosis Date Noted   Status post total knee replacement using cement, right 08/15/2023   Coronary artery calcification 09/09/2019   Aortic atherosclerosis (HCC) 09/09/2019   Allergic rhinitis 08/06/2019   Asthma 08/06/2019   Diabetes mellitus, type 2 (HCC) 08/06/2019   Dysphagia 08/06/2019   GERD (gastroesophageal reflux disease) 08/06/2019   Hypercalcemia 08/06/2019   Hypertension 08/06/2019   Migraines 08/06/2019   Osteoarthritis 08/06/2019   Folate deficiency 03/03/2019   Morbid obesity with BMI of 40.0-44.9, adult (HCC) 06/05/2018   Status post right rotator cuff repair 06/05/2018   Degenerative joint disease of right shoulder 05/24/2018   Degenerative tear of glenoid labrum, right 05/24/2018   Injury of tendon of long head of right biceps 05/24/2018   Nontraumatic complete tear of right rotator cuff 05/14/2018   Rotator cuff tendinitis, right 05/14/2018   Iron deficiency anemia 01/15/2018   B12 deficiency 09/07/2017   Microalbuminuric diabetic nephropathy (HCC) 09/07/2017   Primary osteoarthritis of right knee 02/19/2015   Restless leg syndrome 10/25/2012   Hyperlipidemia with target LDL less than 100 05/20/2012    Past Medical History:  Diagnosis Date   Anemia    iron deficiency and b12 deficiency   Anxiety    Aortic atherosclerosis (HCC)    Arthritis    Asthma    Diabetes mellitus without complication (HCC)    Fasciitis    left foot   GERD (gastroesophageal reflux disease)    History of kidney stones    Hypercholesterolemia    Hypertension    Migraines    MIGRAINES HAVE IMPROVED SINCE RECEIVING IRON   Pneumonia    Primary  osteoarthritis of right knee    Tachycardia      Transfusion: None.   Consultants (if any):   Discharged Condition: Improved  Hospital Course: Andrea Zhang is an 62 y.o. female who was admitted 08/15/2023 with a diagnosis of primary osteoarthritis of the right knee and went to the operating room on 08/15/2023 and underwent the above named procedures.    Surgeries: Procedure(s): TOTAL KNEE ARTHROPLASTY on 08/15/2023 Patient tolerated the surgery well. Taken to PACU where she was stabilized and then transferred to the post op recovery area.  Started on Eliquis 2.5mg  every 12 hrs. . Heels elevated on bed with rolled towels. No evidence of DVT. Negative Homan. Physical therapy started on day #1 for gait training and transfer. OT started day #1 for ADL and assisted devices.  Patient's IV was removed on POD1.  Implants: Right TKA using all-cemented Zimmer Persona system with a #6 narrow PCR femur, a(n) D-sized  tibial tray with an  11 mm medial congruent E-poly insert, and a 32 x 8.5 mm all-poly 3-pegged domed patella.   She was given perioperative antibiotics:  Anti-infectives (From admission, onward)    Start     Dose/Rate Route Frequency Ordered Stop   08/15/23 1700  vancomycin (VANCOCIN) IVPB 1000 mg/200 mL premix        1,000 mg 200 mL/hr over 60 Minutes Intravenous Every 12 hours 08/15/23 1647 08/15/23 1940   08/15/23 0600  vancomycin (VANCOCIN) IVPB 1000 mg/200 mL premix        1,000 mg 200 mL/hr over 60 Minutes Intravenous On call to  O.R. 08/14/23 2213 08/15/23 1100     .  She was given sequential compression devices, early ambulation, and Eliquis for DVT prophylaxis.  She benefited maximally from the hospital stay and there were no complications.    Recent vital signs:  Vitals:   08/16/23 0600 08/16/23 0717  BP: 116/77 104/72  Pulse: 88 80  Resp: 16 16  Temp: 98 F (36.7 C) (!) 97.5 F (36.4 C)  SpO2: 98% 98%    Recent laboratory studies:  Lab Results   Component Value Date   HGB 16.2 (H) 08/08/2023   HGB 14.9 07/26/2023   HGB 14.9 05/23/2023   Lab Results  Component Value Date   WBC 8.0 08/08/2023   PLT 401 (H) 08/08/2023   Lab Results  Component Value Date   INR 0.9 09/04/2012   Lab Results  Component Value Date   NA 138 08/08/2023   K 3.8 08/08/2023   CL 100 08/08/2023   CO2 27 08/08/2023   BUN 10 08/08/2023   CREATININE 0.76 08/08/2023   GLUCOSE 92 08/08/2023    Discharge Medications:   Allergies as of 08/16/2023       Reactions   Iodinated Contrast Media Other (See Comments)   Becomes 'unresponsive' to ORAL and IV DYE BETADINE ON THE SKIN IS OKAY unresponsive Patient could hear things but not responsive Becomes 'unresponsive' to ORAL and IV DYE BETADINE ON THE SKIN IS OKAY Patient could hear things but not responsive Becomes 'unresponsive' to ORAL and IV DYE BETADINE ON THE SKIN IS OKAY Becomes 'unresponsive' to ORAL and IV DYE BETADINE ON THE SKIN IS OKAY unresponsive Patient could hear things but not responsiveBecomes 'unresponsive' to ORAL and IV DYE BETADINE ON THE SKIN IS OKAY   Iodine    Other reaction(s): Other (See Comments) Becomes 'unresponsive' to ORAL and IV DYE BETADINE ON THE SKIN IS OKAY unresponsive Patient could hear things but not responsive Becomes 'unresponsive' to ORAL and IV DYE BETADINE ON THE SKIN IS OKAY Patient could hear things but not responsive Becomes 'unresponsive' to ORAL and IV DYE BETADINE ON THE SKIN IS OKAY Becomes 'unresponsive' to ORAL and IV DYE BETADINE ON THE SKIN IS OKAY unresponsive Patient could hear things but not responsiveBecomes 'unresponsive' to ORAL and IV DYE BETADINE ON THE SKIN IS OKAY   Diclofenac Hives   HORRIBLE RASH with both PATCH OR CREAM   Ibuprofen Itching   Zyrtec [cetirizine] Other (See Comments)   HEADACHE   Cephalexin Rash   Maxalt [rizatriptan Benzoate] Other (See Comments)   'Heart Races'   Orphenadrine Citrate Other (See  Comments)   Patient unsure of this allergy.   Zithromax [azithromycin] Other (See Comments)   Severe Abdominal Cramps   Zomig [zolmitriptan] Other (See Comments)   'Heart Races'        Medication List     STOP taking these medications    aspirin EC 81 MG tablet   meloxicam 15 MG tablet Commonly known as: MOBIC       TAKE these medications    acetaminophen 500 MG tablet Commonly known as: TYLENOL Take 1,000 mg by mouth every 6 (six) hours as needed.   albuterol 108 (90 Base) MCG/ACT inhaler Commonly known as: VENTOLIN HFA Inhale 2 puffs into the lungs every 6 (six) hours as needed for wheezing or shortness of breath.   apixaban 2.5 MG Tabs tablet Commonly known as: Eliquis Take 1 tablet (2.5 mg total) by mouth 2 (two) times daily.   atenolol  50 MG tablet Commonly known as: TENORMIN Take 25 mg by mouth daily.   BIOFREEZE ROLL-ON EX Apply topically as needed.   carboxymethylcellulose 0.5 % Soln Commonly known as: REFRESH PLUS Place 1 drop into both eyes 3 (three) times daily as needed.   cetirizine 10 MG tablet Commonly known as: ZYRTEC Take 10 mg by mouth as needed for allergies.   Cholecalciferol 50 MCG (2000 UT) Caps Take 2,000 Units by mouth daily.   cyclobenzaprine 5 MG tablet Commonly known as: FLEXERIL Take 5 mg by mouth 3 (three) times daily as needed for muscle spasms.   estrogens (conjugated) 0.625 MG tablet Commonly known as: PREMARIN Take 0.625 mg by mouth 3 (three) times a week. Take daily for 21 days then do not take for 7 days.   fluticasone 50 MCG/ACT nasal spray Commonly known as: FLONASE Place 1 spray into both nostrils as needed for allergies or rhinitis.   gabapentin 800 MG tablet Commonly known as: NEURONTIN Take by mouth. 7 pm   Magnesium 250 MG Tabs Take 1 tablet by mouth daily.   Multi-Vitamin tablet Take 1 tablet by mouth daily.   naratriptan 2.5 MG tablet Commonly known as: AMERGE Take 2.5 mg by mouth as needed for  migraine. Take one (1) tablet at onset of headache; if returns or does not resolve, may repeat after 4 hours; do not exceed five (5) mg in 24 hours.   omeprazole 40 MG capsule Commonly known as: PRILOSEC Take 40 mg by mouth 2 (two) times daily.   ondansetron 4 MG tablet Commonly known as: ZOFRAN Take 1 tablet (4 mg total) by mouth every 6 (six) hours as needed for nausea.   OVER THE COUNTER MEDICATION 1 tablet daily. Iron Repair Plus - folate - vit B12 - 25 mcg -iron 20 mg   oxyCODONE 5 MG immediate release tablet Commonly known as: Roxicodone Take 1-2 tablets (5-10 mg total) by mouth every 4 (four) hours as needed for moderate pain or severe pain.   Ozempic (0.25 or 0.5 MG/DOSE) 2 MG/3ML Sopn Generic drug: Semaglutide(0.25 or 0.5MG /DOS) Inject into the skin. Sunday   PreviDent 5000 Booster Plus 1.1 % Pste Generic drug: Sodium Fluoride Place onto teeth.   promethazine 25 MG tablet Commonly known as: PHENERGAN Take by mouth every 8 (eight) hours as needed.   QULIPTA PO Take by mouth daily.   simvastatin 10 MG tablet Commonly known as: ZOCOR Take 1 tablet by mouth at bedtime.   SUMAtriptan 100 MG tablet Commonly known as: IMITREX Take 0.5 tablets (50 mg total) by mouth every 2 (two) hours as needed for migraine. What changed:  how much to take when to take this reasons to take this               Durable Medical Equipment  (From admission, onward)           Start     Ordered   08/15/23 1648  DME Bedside commode  Once       Question:  Patient needs a bedside commode to treat with the following condition  Answer:  Status post total knee replacement using cement, right   08/15/23 1647   08/15/23 1648  DME 3 n 1  Once        10 /08/24 1647   08/15/23 1648  DME Walker rolling  Once       Question Answer Comment  Walker: With 5 Inch Wheels   Patient needs a walker to treat with the  following condition Status post total knee replacement using cement,  right      08/15/23 1647            Diagnostic Studies: DG Knee Right Port  Result Date: 08/15/2023 CLINICAL DATA:  Postop knee replacement. EXAM: PORTABLE RIGHT KNEE - 1-2 VIEW COMPARISON:  None Available. FINDINGS: Right knee arthroplasty in expected alignment. No periprosthetic lucency or fracture. There has been patellar resurfacing. Recent postsurgical change includes air and edema in the soft tissues and joint space. Anterior skin staples in place. IMPRESSION: Right knee arthroplasty without immediate postoperative complication. Electronically Signed   By: Narda Rutherford M.D.   On: 08/15/2023 15:01    Disposition: Plan for discharge home today pending progress with PT.     Follow-up Information     Dedra Skeens, [place]. Go on 08/30/2023.   Specialty: Orthopedic Surgery Why: at 10:15am with Dedra Skeens PA in [facility] information: [address] [place] [address] [phone]                Signed: Meriel Pica PA-C 08/16/2023, 7:43 AM

## 2023-08-16 NOTE — Progress Notes (Signed)
DISCHARGE NOTE:  Pt given discharge instructions and verbalized understanding. Ted hose on both legs. [facility] sent with husband (10/8). Pt wheeled to car by staff, husband providing transportation.

## 2023-08-16 NOTE — Progress Notes (Signed)
  Subjective: 1 Day Post-Op Procedure(s) (LRB): TOTAL KNEE ARTHROPLASTY (Right) Patient reports pain as mild.   Patient is well, and has had no acute complaints or problems Plan is to go Home after hospital stay. Negative for chest pain and shortness of breath Fever: no Gastrointestinal:Negative for nausea and vomiting  Objective: Vital signs in last 24 hours: Temp:  [97.1 F (36.2 C)-98.3 F (36.8 C)] 97.5 F (36.4 C) (10/09 0717) Pulse Rate:  [76-93] 80 (10/09 0717) Resp:  [12-21] 16 (10/09 0717) BP: (74-118)/(49-91) 104/72 (10/09 0717) SpO2:  [94 %-100 %] 98 % (10/09 0717)  Intake/Output from previous day:  Intake/Output Summary (Last 24 hours) at 08/16/2023 0738 Last data filed at 08/15/2023 1530 Gross per 24 hour  Intake 1260 ml  Output 930 ml  Net 330 ml    Intake/Output this shift: No intake/output data recorded.  Labs: No results for input(s): "HGB" in the last 72 hours. No results for input(s): "WBC", "RBC", "HCT", "PLT" in the last 72 hours. No results for input(s): "NA", "K", "CL", "CO2", "BUN", "CREATININE", "GLUCOSE", "CALCIUM" in the last 72 hours. No results for input(s): "LABPT", "INR" in the last 72 hours.   EXAM General - Patient is Alert, Appropriate, and Oriented Extremity - ABD soft Neurovascular intact Dorsiflexion/Plantar flexion intact Incision: dressing C/D/I No cellulitis present Compartment soft Dressing/Incision - clean, dry, no drainage to the right knee. Motor Function - intact, moving foot and toes well on exam.  Abdomen soft with intact bowel sounds this morning.  Past Medical History:  Diagnosis Date   Anemia    iron deficiency and b12 deficiency   Anxiety    Aortic atherosclerosis (HCC)    Arthritis    Asthma    Diabetes mellitus without complication (HCC)    Fasciitis    left foot   GERD (gastroesophageal reflux disease)    History of kidney stones    Hypercholesterolemia    Hypertension    Migraines    MIGRAINES  HAVE IMPROVED SINCE RECEIVING IRON   Pneumonia    Primary osteoarthritis of right knee    Tachycardia     Assessment/Plan: 1 Day Post-Op Procedure(s) (LRB): TOTAL KNEE ARTHROPLASTY (Right) Principal Problem:   Status post total knee replacement using cement, right  Estimated body mass index is 34.06 kg/m as calculated from the following:   Height as of 08/08/23: 5' (1.524 m).   Weight as of 08/08/23: 79.1 kg. Advance diet Up with therapy D/C IV fluids when tolerating po intake.  Vitals reviewed this AM, BP 104/72.  Will monitor when standing with PT.  Will hold BB is not improving. Up with therapy today. Patient is passing gas and urinating well this morning. Plan for discharge home today pending progress with PT.  DVT Prophylaxis - TED hose and Eliquis Weight-Bearing as tolerated to right leg  J. Horris Latino, PA-C [facility] Orthopaedic Surgery 08/16/2023, 7:38 AM

## 2023-08-16 NOTE — Plan of Care (Signed)
?  Problem: Activity: ?Goal: Risk for activity intolerance will decrease ?Outcome: Progressing ?  ?Problem: Nutrition: ?Goal: Adequate nutrition will be maintained ?Outcome: Progressing ?  ?Problem: Elimination: ?Goal: Will not experience complications related to urinary retention ?Outcome: Progressing ?  ?Problem: Pain Managment: ?Goal: General experience of comfort will improve ?Outcome: Progressing ?  ?

## 2023-08-16 NOTE — Plan of Care (Signed)
  Problem: Activity: Goal: Risk for activity intolerance will decrease Outcome: Progressing   Problem: Coping: Goal: Level of anxiety will decrease Outcome: Progressing   Problem: Elimination: Goal: Will not experience complications related to urinary retention Outcome: Progressing   

## 2023-08-16 NOTE — Anesthesia Postprocedure Evaluation (Signed)
Anesthesia Post Note  Patient: Andrea Zhang  Procedure(s) Performed: TOTAL KNEE ARTHROPLASTY (Right: Knee)  Patient location during evaluation: Nursing Unit Anesthesia Type: Spinal Level of consciousness: awake Pain management: pain level controlled Respiratory status: spontaneous breathing Cardiovascular status: stable Postop Assessment: no headache Anesthetic complications: no   No notable events documented.   Last Vitals:  Vitals:   08/16/23 0600 08/16/23 0717  BP: 116/77 104/72  Pulse: 88 80  Resp: 16 16  Temp: 36.7 C (!) 36.4 C  SpO2: 98% 98%    Last Pain:  Vitals:   08/16/23 0717  TempSrc: Oral  PainSc:                  Jaye Beagle

## 2023-08-16 NOTE — Evaluation (Signed)
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  Clinical Impression  Pt was pleasant and motivated to participate during the session and put forth good effort throughout. She is currently Independent with bed mobility, and Supervision for STS transfer with RW from variable heights and surfaces, only needing cues for RLE placement and set up. Pt performed multiple amb bouts with RW and CGA, initially stepping with step-to pattern but quickly progressed to step-through pattern. Only needing cues for RW management when taking steps backwards. Pr educated on and demonstrated backwards ascending and forwards descending with RW stair training. Pr able to perform 1 step with good eccentric/concentric control. Pt also provided education on safe car transfers methods. Pt will benefit from continued PT services upon discharge to safely address deficits listed in patient problem list for decreased caregiver assistance and eventual return to PLOF.       If plan is discharge home, recommend the following: A little help with walking and/or transfers;A little help with bathing/dressing/bathroom;Assist for transportation;Help with stairs or ramp for entrance;Assistance with cooking/housework   Can travel by private vehicle        Equipment Recommendations BSC/3in1  Recommendations for Other Services       Functional Status Assessment Patient has had a recent decline in their functional status and demonstrates the ability to make significant improvements in function in a reasonable and predictable amount of time.     Precautions / Restrictions Precautions Precautions: Knee Precaution Booklet Issued: Yes (comment) Restrictions Weight Bearing Restrictions: Yes RLE Weight Bearing: Weight bearing as tolerated       Mobility  Bed Mobility Overal bed mobility: Independent                  Transfers Overall transfer level: Needs Assistance Equipment used: Rolling walker (2 wheels) Transfers: Sit to/from Stand Sit to Stand: Supervision       General transfer comment: increased time, Cues provided for RLE positioning and management    Ambulation/Gait Ambulation/Gait assistance: Contact guard assist Gait Distance (Feet): 20 Feet x1, 70 feet x1, 80 feet x1 Assistive device: Rolling walker (2 wheels) Gait Pattern/deviations: Decreased step length - right, Decreased step length - left, Decreased stride length Gait velocity: decreased     General Gait Details: Performed amb bout with overall good step-to, quickly progressing to step through pattern. No cues needed for sequencing other than RW management when taking backwards steps.  Stairs Stairs: Yes Stairs assistance: Contact guard assist Stair Management: Backwards, Forwards, With walker Number of Stairs: 1 General stair comments: education and demonstration for backwards ascending, forwards ascening sequencing with RW. Pt able to perform with good eccentric and concentric control with proper sequencing  Wheelchair Mobility     Tilt Bed    Modified Rankin (Stroke Patients Only)       Balance Overall balance assessment: No apparent balance deficits (not formally assessed)                                           Pertinent Vitals/Pain Pain Assessment Pain Assessment: 0-10 Pain Score: 4  Pain Location: R knee Pain Descriptors / Indicators: Sore Pain Intervention(s): Monitored during session    Home Living Family/patient expects to  be discharged to:: Private residence Living Arrangements: Spouse/significant other Available Help at Discharge: Family;Available 24 hours/day Type of Home: House Home Access: Ramped entrance (small threshold for entry)       Home Layout: Able to live on main level  with bedroom/bathroom Home Equipment: Shower seat;Grab bars - tub/shower;Rolling Walker (2 wheels);BSC/3in1      Prior Function Prior Level of Function : Independent/Modified Independent             Mobility Comments: Full community amb ADLs Comments: Ind     Extremity/Trunk Assessment   Upper Extremity Assessment Upper Extremity Assessment: Overall WFL for tasks assessed    Lower Extremity Assessment Lower Extremity Assessment: Overall WFL for tasks assessed       Communication   Communication Communication: No apparent difficulties  Cognition Arousal: Alert Behavior During Therapy: WFL for tasks assessed/performed Overall Cognitive Status: Within Functional Limits for tasks assessed                                          General Comments      Exercises Total Joint Exercises Ankle Circles/Pumps: 10 reps, Both, Strengthening Quad Sets: Right, 5 reps, AROM, Strengthening Knee Flexion: 10 reps, Right, Strengthening, AROM Goniometric ROM: R Knee AROM: 0-111 degrees Other Exercises Other Exercises: education on safe car transfer methods, pt verbalizing understanding Other Exercises: Education on Stair navigation and proper sequencing for step/threshold entry into home, pt verbalizing understanding Other Exercises: Provided HEP packet with education and demonstration with exercises, pt verbalizing understanding   Assessment/Plan    PT Assessment Patient needs continued PT services  PT Problem List Decreased strength;Decreased coordination;Decreased range of motion;Decreased activity tolerance;Decreased balance;Decreased mobility       PT Treatment Interventions DME instruction;Balance training;Gait training;Stair training;Functional mobility training;Therapeutic activities;Therapeutic exercise    PT Goals (Current goals can be found in the Care Plan section)  Acute Rehab PT Goals Patient Stated Goal: walk without pain PT Goal Formulation:  With patient Time For Goal Achievement: 08/29/23 Potential to Achieve Goals: Good    Frequency BID     Co-evaluation               AM-PAC PT "6 Clicks" Mobility  Outcome Measure Help needed turning from your back to your side while in a flat bed without using bedrails?: None Help needed moving from lying on your back to sitting on the side of a flat bed without using bedrails?: None Help needed moving to and from a bed to a chair (including a wheelchair)?: A Little Help needed standing up from a chair using your arms (e.g., wheelchair or bedside chair)?: A Little Help needed to walk in hospital room?: A Little Help needed climbing 3-5 steps with a railing? : A Little 6 Click Score: 20    End of Session Equipment Utilized During Treatment: Gait belt Activity Tolerance: Patient tolerated treatment well Patient left: in chair;Other (comment) (Hand off to OT) Nurse Communication: Mobility status PT Visit Diagnosis: Other abnormalities of gait and mobility (R26.89);Difficulty in walking, not elsewhere classified (R26.2);Pain Pain - Right/Left: Right Pain - part of body: Knee    Time: 2956-2130 PT Time Calculation (min) (ACUTE ONLY): 31 min   Charges:                 Cecile Sheerer, SPT 08/16/23, 1:38 PM

## 2023-08-16 NOTE — Evaluation (Signed)
Occupational Therapy Evaluation Patient Details Name: Andrea Zhang MRN: [id number] DOB: 02/18/1961 Today's Date: 08/16/2023   History of Present Illness Andrea Zhang is a 61yoF who comes [facility] for elective Rt TKA in the setting of DJD.   Clinical Impression   Andrea Zhang was seen for OT evaluation this date. Prior to hospital admission, pt was IND. Pt lives with spouse. Pt currently requires SUPERVISION don underwear, pants, and socks, SUP for standing portion. MIN cues don polar care. Pt instructed in polar care mgt, falls prevention strategies, home/routines modifications, DME/AE for LB bathing/dressing tasks, and compression stocking mgt. All education complete, will sign off. Upon hospital discharge, recommend no OT follow up.    If plan is discharge home, recommend the following: Help with stairs or ramp for entrance    Functional Status Assessment  Patient has had a recent decline in their functional status and demonstrates the ability to make significant improvements in function in a reasonable and predictable amount of time.  Equipment Recommendations  BSC/3in1    Recommendations for Other Services       Precautions / Restrictions Precautions Precautions: Knee Restrictions Weight Bearing Restrictions: Yes RLE Weight Bearing: Weight bearing as tolerated      Mobility Bed Mobility               General bed mobility comments: not tested    Transfers Overall transfer level: Needs assistance Equipment used: None Transfers: Sit to/from Stand Sit to Stand: Supervision                  Balance Overall balance assessment: No apparent balance deficits (not formally assessed)                                         ADL either performed or assessed with clinical judgement   ADL Overall ADL's : Needs assistance/impaired                                       General ADL Comments: SUPERVISION don underwear, pants, and  socks, SUP for standing portion.      Pertinent Vitals/Pain Pain Assessment Pain Assessment: No/denies pain     Extremity/Trunk Assessment Upper Extremity Assessment Upper Extremity Assessment: Overall WFL for tasks assessed   Lower Extremity Assessment Lower Extremity Assessment: Overall WFL for tasks assessed       Communication Communication Communication: No apparent difficulties   Cognition Arousal: Alert Behavior During Therapy: WFL for tasks assessed/performed Overall Cognitive Status: Within Functional Limits for tasks assessed                                        Home Living Family/patient expects to be discharged to:: Private residence Living Arrangements: Spouse/significant other Available Help at Discharge: Family;Available 24 hours/day Type of Home: House             Bathroom Shower/Tub: Tub/shower unit         Home Equipment: Shower seat;Grab bars - tub/shower          Prior Functioning/Environment Prior Level of Function : Independent/Modified Independent  OT Problem List: Decreased activity tolerance         OT Goals(Current goals can be found in the care plan section) Acute Rehab OT Goals Patient Stated Goal: go home OT Goal Formulation: With patient Time For Goal Achievement: 08/16/23 Potential to Achieve Goals: Good   AM-PAC OT "6 Clicks" Daily Activity     Outcome Measure Help from another person eating meals?: None Help from another person taking care of personal grooming?: None Help from another person toileting, which includes using toliet, bedpan, or urinal?: None Help from another person bathing (including washing, rinsing, drying)?: A Little Help from another person to put on and taking off regular upper body clothing?: None Help from another person to put on and taking off regular lower body clothing?: A Little 6 Click Score: 22   End of Session Nurse Communication:  Mobility status  Activity Tolerance: Patient tolerated treatment well Patient left: in chair;with call bell/phone within reach  OT Visit Diagnosis: Unsteadiness on feet (R26.81)                Time: 3086-5784 OT Time Calculation (min): 10 min Charges:  OT General Charges $OT Visit: 1 Visit OT Evaluation $OT Eval Low Complexity: 1 Low  Kathie Dike, M.S. OTR/L  08/16/23, 10:08 AM  ascom [phone]

## 2023-08-17 ENCOUNTER — Encounter: Payer: Self-pay | Admitting: Physical Therapy

## 2023-08-30 NOTE — Therapy (Unsigned)
OUTPATIENT PHYSICAL THERAPY KNEE EVALUATION  Patient Name: Andrea Zhang MRN: [id number] DOB:1961-04-17, 62 y.o., female Today's Date: 08/31/2023  END OF SESSION:  PT End of Session - 08/31/23 0904     Visit Number 1    Number of Visits 17    Date for PT Re-Evaluation 10/26/23    PT Start Time 0904    PT Stop Time 0945    PT Time Calculation (min) 41 min    Activity Tolerance Patient tolerated treatment well    Behavior During Therapy [facility] for tasks assessed/performed             Past Medical History:  Diagnosis Date   Anemia    iron deficiency and b12 deficiency   Anxiety    Aortic atherosclerosis (HCC)    Arthritis    Asthma    Diabetes mellitus without complication (HCC)    Fasciitis    left foot   GERD (gastroesophageal reflux disease)    History of kidney stones    Hypercholesterolemia    Hypertension    Migraines    MIGRAINES HAVE IMPROVED SINCE RECEIVING IRON   Pneumonia    Primary osteoarthritis of right knee    Tachycardia    Past Surgical History:  Procedure Laterality Date   CHOLECYSTECTOMY  2004   COLONOSCOPY WITH ESOPHAGOGASTRODUODENOSCOPY (EGD)  02/2018   ESOPHAGOGASTRODUODENOSCOPY N/A 10/16/2020   Procedure: ESOPHAGOGASTRODUODENOSCOPY (EGD);  Surgeon: Regis Bill, MD;  Location: [facility] ENDOSCOPY;  Service: Endoscopy;  Laterality: N/A;   ESOPHAGOGASTRODUODENOSCOPY (EGD) WITH PROPOFOL N/A 06/06/2016   Procedure: ESOPHAGOGASTRODUODENOSCOPY (EGD) WITH PROPOFOL;  Surgeon: Scot Jun, MD;  Location: [facility] ENDOSCOPY;  Service: Endoscopy;  Laterality: N/A;   EXTRACORPOREAL SHOCK WAVE LITHOTRIPSY  2010   JOINT REPLACEMENT  2013   LT TKR   SAVORY DILATION N/A 06/06/2016   Procedure: SAVORY DILATION;  Surgeon: Scot Jun, MD;  Location: [facility] ENDOSCOPY;  Service: Endoscopy;  Laterality: N/A;   SAVORY DILATION  02/2018   SHOULDER ARTHROSCOPY WITH OPEN ROTATOR CUFF REPAIR Right 05/24/2018   Procedure: right shoulder arthroscopy,  extensive arthroscopic debridement, decompression, open rotator cuff repair, biceps tenodesis;  Surgeon: Christena Flake, MD;  Location: [facility] ORS;  Service: Orthopedics;  Laterality: Right;   TONSILLECTOMY  1978   TOTAL KNEE ARTHROPLASTY Right 08/15/2023   Procedure: TOTAL KNEE ARTHROPLASTY;  Surgeon: Christena Flake, MD;  Location: [facility] ORS;  Service: Orthopedics;  Laterality: Right;   Patient Active Problem List   Diagnosis Date Noted   Status post total knee replacement using cement, right 08/15/2023   Coronary artery calcification 09/09/2019   Aortic atherosclerosis (HCC) 09/09/2019   Allergic rhinitis 08/06/2019   Asthma 08/06/2019   Diabetes mellitus, type 2 (HCC) 08/06/2019   Dysphagia 08/06/2019   GERD (gastroesophageal reflux disease) 08/06/2019   Hypercalcemia 08/06/2019   Hypertension 08/06/2019   Migraines 08/06/2019   Osteoarthritis 08/06/2019   Folate deficiency 03/03/2019   Morbid obesity with BMI of 40.0-44.9, adult (HCC) 06/05/2018   Status post right rotator cuff repair 06/05/2018   Degenerative joint disease of right shoulder 05/24/2018   Degenerative tear of glenoid labrum, right 05/24/2018   Injury of tendon of long head of right biceps 05/24/2018   Nontraumatic complete tear of right rotator cuff 05/14/2018   Rotator cuff tendinitis, right 05/14/2018   Iron deficiency anemia 01/15/2018   B12 deficiency 09/07/2017   Microalbuminuric diabetic nephropathy (HCC) 09/07/2017   Primary osteoarthritis of right knee 02/19/2015   Restless leg syndrome 10/25/2012  Hyperlipidemia with target LDL less than 100 05/20/2012    PCP: Dalia Heading, MD  REFERRING PROVIDER: Dedra Skeens, PA-C  REFERRING DIAG: [phone] (ICD-10-CM) - Presence of right artificial knee joint   RATIONALE FOR EVALUATION AND TREATMENT: Rehabilitation  THERAPY DIAG: Acute pain of right knee  Stiffness of right knee, not elsewhere classified  Difficulty in walking, not elsewhere  classified  Muscle weakness (generalized)  ONSET DATE: R TKA 08/15/23  FOLLOW-UP APPT SCHEDULED WITH REFERRING PROVIDER: Not yet scheduled, staples removed yesterday   SUBJECTIVE:                                                                                                                                                                                         SUBJECTIVE STATEMENT:  Pt is a 62 year old female s/p R TKA 08/15/23  PERTINENT HISTORY: Pt is a 61 year old female s/p R TKA 08/15/23. Hx of L TKA 11 years ago. Patient reports notable pain with staples in incision and attempting to bend knee - they were removed yesterday. Pt reports wearing shoes for first time Tuesday night and had notable pain from bunion in L foot. She is wearing thicker sock on L foot now and this seems to help. Patient reports using wide size shoes to accommodate her bunion. Patient has completed home health therapy. Per home health paperwork, SPPB 10/12, TUG 15.02 sec.   PAIN:   Pain Intensity: Present: 3/10, Best: 0/10, Worst: 10/10 Pain location: Posterior knee/popliteal region Pain quality: aching  Radiating pain: Yes , feels bruised into shin  Swelling: Yes   Numbness/Tingling: No Aggravating factors: bending knee, prolonged sitting Relieving factors: ice/cold compression machine,   History of prior back, hip, or knee injury, pain, surgery, or therapy: Yes; hx of bilateral bunion that is sypmtomatic   Imaging: Yes ;  CLINICAL DATA:  Postop knee replacement. DG 08/15/23   EXAM: PORTABLE RIGHT KNEE - 1-2 VIEW   COMPARISON:  None Available.   FINDINGS: Right knee arthroplasty in expected alignment. No periprosthetic lucency or fracture. There has been patellar resurfacing. Recent postsurgical change includes air and edema in the soft tissues and joint space. Anterior skin staples in place.   IMPRESSION: Right knee arthroplasty without immediate postoperative complication.   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 No  Living Environment Lives with: lives with their  spouse; her son stayed with pt first week after surgery. Husband is back to work. Pt is at home alone during work week at this time. Pt has sister who is hemiplegic who previously stayed with her.  Lives in: House/apartment Ramped entry, grab bar installed. Step-in shower. Bench in shower. Pt uses raised commode seat to sit down and get dressed. Concrete driveway to enter home.   Patient Goals: No assistive device, able to sit at her desk for work at her computer.    OBJECTIVE:   Patient Surveys  FOTO 41/58 predicted outcome score   Cognition Patient is oriented to person, place, and time.  Recent memory is intact.  Remote memory is intact.  Attention span and concentration are intact.  Expressive speech is intact.  Patient's fund of knowledge is within normal limits for educational level.    Gross Musculoskeletal Assessment Tremor: None Bulk: Atrophy of R>L quadriceps Tone: Normal No sign of acute infection   GAIT: Distance walked: 30 ft Assistive device utilized: Walker - 2 wheeled Level of assistance: SBA Comments: Dec terminal knee extension, dec heel strike, dec step length R, dec stance time R/antalgic pattern   Posture:  AROM AROM (Normal range in degrees) AROM  Hip Right 08/31/23 Left 08/31/23  Flexion (125)    Extension (15)    Abduction (40)    Adduction     Internal Rotation (45)    External Rotation (45)        Knee    Flexion (135) 101 135  Extension (0) 0 +3      Ankle    Dorsiflexion (20) WNL WNL  Plantarflexion  (50) WNL WNL  Inversion (35)    Eversion (15    (* = pain; Blank rows = not tested)  LE MMT: MMT (out of 5) Right 08/31/23 Left 08/31/23  Hip flexion 4- 4  Hip extension    Hip abduction (seated) 5 5  Hip adduction (seated) 5 5  Hip internal rotation    Hip external rotation    Knee flexion 5 5  Knee extension 4- 5  Ankle dorsiflexion    Ankle plantarflexion    Ankle inversion    Ankle eversion    (* = pain; Blank rows = not tested)  *Pt demonstrates SLR with no extensor lag   Sensation Deferred  Reflexes Deferred  Muscle Length Hamstrings: R: Positive L: Negative Quadriceps ([place]): R: Not examined L: Not examined   Palpation Location LEFT  RIGHT           Quadriceps  0  Medial Hamstrings  0  Lateral Hamstrings  0  Lateral Hamstring tendon  0  Medial Hamstring tendon  0  Quadriceps tendon  0  Patella  0  Patellar Tendon  1  Tibial Tuberosity  1  Medial joint line  0  Lateral joint line  0  MCL  0  LCL  0  Adductor Tubercle    Pes Anserine tendon    Infrapatellar fat pad    Fibular head    Popliteal fossa    TTP primarily along midline incision (1)  (Blank rows = not tested) Graded on 0-4 scale (0 = no pain, 1 = pain, 2 = pain with wincing/grimacing/flinching, 3 = pain with withdrawal, 4 = unwilling to allow palpation), (Blank rows = not tested)   VASCULAR Dorsalis pedis and posterior tibial pulses are palpable No sign of acute DVT   SPECIAL TESTS Homans' Signs: R Negative, L Negative  TODAY'S TREATMENT:    Therapeutic Exercise - for HEP establishment, discussion on appropriate exercise/activity modification, PT education   Reviewed established HEP from home health and updated baseline home exercises; provided handout for [employer] program (see Access Code); tactile cueing and therapist demonstration utilized as needed for carryover of proper technique to HEP.    Patient education on current condition, role of PT, prognosis, plan of  care. Discussion on appropriate AD use and expected AD wean over next 1-2 weeks. We discussed DVT prophylaxis and monitoring for S&S of infection.   -Reviewed technique for stair negotiation with with FWW   PATIENT EDUCATION:  Education details: see above for patient education details Person educated: Patient Education method: Explanation, Demonstration, and Handouts Education comprehension: verbalized understanding and returned demonstration   HOME EXERCISE PROGRAM:  Access Code: [email or web address] URL: [email or web address] Date: 08/31/2023 Prepared by: Consuela Mimes  Exercises - Seated Hamstring Stretch  - 2 x daily - 7 x weekly - 3 sets - 30sec hold - Active Straight Leg Raise with Quad Set  - 2 x daily - 7 x weekly - 2 sets - 10 reps   ASSESSMENT:  CLINICAL IMPRESSION: Patient is a 62 y.o. female who was seen today for physical therapy evaluation and treatment s/p R TKA 08/15/23. Pt has expected post-operative deficits in R knee ROM, R quad and hip strength, pain, edema, and decreased weightbearing tolerance. She is making good early progress with quadriceps control and ROM. Pt has associated activity limitations with gait, transfers, stair/curb negotiation, and bed mobility. Pt will continue to benefit from skilled PT services to address deficits and improve function.  OBJECTIVE IMPAIRMENTS: Abnormal gait, decreased balance, decreased mobility, difficulty walking, decreased ROM, decreased strength, increased edema, impaired flexibility, and pain.   ACTIVITY LIMITATIONS: carrying, lifting, bending, sitting, squatting, stairs, transfers, bed mobility, and locomotion level  PARTICIPATION LIMITATIONS: meal prep, cleaning, laundry, driving, shopping, community activity, and occupation  PERSONAL FACTORS: Past/current experiences, complicated orthopedic history, and 3+ comorbidities: (Type 2 DM, diabetic nephropathy, anxiety, anemia, HTN, and aortic atherosclerosis) are also  affecting patient's functional outcome.   REHAB POTENTIAL: Good  CLINICAL DECISION MAKING: Evolving/moderate complexity  EVALUATION COMPLEXITY: Moderate   GOALS: Goals reviewed with patient? Yes  SHORT TERM GOALS: Target date: 09/21/2023  Pt will be independent with HEP to improve strength and decrease knee pain to improve pain-free function at home and work. Baseline: 08/31/23: Home health exercises reviewed; HEP updated and new handout given to pt.  Goal status: INITIAL  By 4 weeks pt will perform TUG in less than 14 seconds indicative of improved home mobility and reduced fall risk Baseline: 08/31/23: 15.02 per home health baseline Goal status: INITIAL   LONG TERM GOALS: Target date: 10/26/2023  Pt will increase FOTO to at least 58 to demonstrate significant improvement in function at home and work related to knee pain  Baseline: 08/31/23: 41 Goal status: INITIAL  2.  Pt will decrease worst knee pain no more than 2-3/10 on the NPRS in order to demonstrate clinically significant reduction in knee pain. Baseline: 08/31/23: 10/10 at worst Goal status: INITIAL  3.  Pt will demonstrate safe reciprocal negotiation of flight of stairs without LOB or buckling of post-op knee as needed for accessing second level of home to complete her workout routine (Bowflex on 2nd level) Baseline: 08/31/23: Patient requires cueing and demo for negotiation of 4 steps with FWW.  Goal status: INITIAL  4.  Pt will increase strength of R quadriceps and tested hip musculature  to 4+/5 MMT grade or greater in order to demonstrate improvement in strength and function  Baseline: 08/31/23: Quads and hip flexors 4-/5 Goal status: INITIAL  5.  Pt will ambulate 660 feet or greater with no AD and no increase in knee pain without significant gait deviation or decreased stance time as needed for independent community-level ambulation  Baseline: 08/31/23: Pt ambulates at limited household distance with  FWW. Goal status: INITIAL   PLAN: PT FREQUENCY: 1-2x/week  PT DURATION: 8 weeks  PLANNED INTERVENTIONS: Therapeutic exercises, Therapeutic activity, Neuromuscular re-education, Balance training, Gait training, Patient/Family education, Self Care, Joint mobilization, Electrical stimulation, Cryotherapy, Moist heat, Manual therapy, and Re-evaluation.  PLAN FOR NEXT SESSION: Progress knee ROM as tolerated, low volume and low impact CKC activities, quad strengthening. Progress with gait training and AD wean as able.     Consuela Mimes, PT, DPT #[id number]  Gertie Exon, PT 08/31/2023, 9:04 AM

## 2023-08-31 ENCOUNTER — Ambulatory Visit: Payer: BC Managed Care – PPO | Attending: Orthopedic Surgery | Admitting: Physical Therapy

## 2023-08-31 DIAGNOSIS — M25661 Stiffness of right knee, not elsewhere classified: Secondary | ICD-10-CM

## 2023-08-31 DIAGNOSIS — M6281 Muscle weakness (generalized): Secondary | ICD-10-CM

## 2023-08-31 DIAGNOSIS — M25561 Pain in right knee: Secondary | ICD-10-CM

## 2023-08-31 DIAGNOSIS — R262 Difficulty in walking, not elsewhere classified: Secondary | ICD-10-CM

## 2023-09-04 NOTE — Therapy (Signed)
OUTPATIENT PHYSICAL THERAPY KNEE TREATMENT  Patient Name: Andrea Zhang MRN: [id number] DOB:Jul 16, 1961, 62 y.o., female Today's Date: 09/05/2023  END OF SESSION:  PT End of Session - 09/05/23 0859     Visit Number 2    Number of Visits 17    Date for PT Re-Evaluation 10/26/23    PT Start Time 0900    PT Stop Time 0943    PT Time Calculation (min) 43 min    Activity Tolerance Patient tolerated treatment well    Behavior During Therapy [facility] for tasks assessed/performed              Past Medical History:  Diagnosis Date   Anemia    iron deficiency and b12 deficiency   Anxiety    Aortic atherosclerosis (HCC)    Arthritis    Asthma    Diabetes mellitus without complication (HCC)    Fasciitis    left foot   GERD (gastroesophageal reflux disease)    History of kidney stones    Hypercholesterolemia    Hypertension    Migraines    MIGRAINES HAVE IMPROVED SINCE RECEIVING IRON   Pneumonia    Primary osteoarthritis of right knee    Tachycardia    Past Surgical History:  Procedure Laterality Date   CHOLECYSTECTOMY  2004   COLONOSCOPY WITH ESOPHAGOGASTRODUODENOSCOPY (EGD)  02/2018   ESOPHAGOGASTRODUODENOSCOPY N/A 10/16/2020   Procedure: ESOPHAGOGASTRODUODENOSCOPY (EGD);  Surgeon: Regis Bill, MD;  Location: [facility] ENDOSCOPY;  Service: Endoscopy;  Laterality: N/A;   ESOPHAGOGASTRODUODENOSCOPY (EGD) WITH PROPOFOL N/A 06/06/2016   Procedure: ESOPHAGOGASTRODUODENOSCOPY (EGD) WITH PROPOFOL;  Surgeon: Scot Jun, MD;  Location: [facility] ENDOSCOPY;  Service: Endoscopy;  Laterality: N/A;   EXTRACORPOREAL SHOCK WAVE LITHOTRIPSY  2010   JOINT REPLACEMENT  2013   LT TKR   SAVORY DILATION N/A 06/06/2016   Procedure: SAVORY DILATION;  Surgeon: Scot Jun, MD;  Location: [facility] ENDOSCOPY;  Service: Endoscopy;  Laterality: N/A;   SAVORY DILATION  02/2018   SHOULDER ARTHROSCOPY WITH OPEN ROTATOR CUFF REPAIR Right 05/24/2018   Procedure: right shoulder arthroscopy,  extensive arthroscopic debridement, decompression, open rotator cuff repair, biceps tenodesis;  Surgeon: Christena Flake, MD;  Location: [facility] ORS;  Service: Orthopedics;  Laterality: Right;   TONSILLECTOMY  1978   TOTAL KNEE ARTHROPLASTY Right 08/15/2023   Procedure: TOTAL KNEE ARTHROPLASTY;  Surgeon: Christena Flake, MD;  Location: [facility] ORS;  Service: Orthopedics;  Laterality: Right;   Patient Active Problem List   Diagnosis Date Noted   Status post total knee replacement using cement, right 08/15/2023   Coronary artery calcification 09/09/2019   Aortic atherosclerosis (HCC) 09/09/2019   Allergic rhinitis 08/06/2019   Asthma 08/06/2019   Diabetes mellitus, type 2 (HCC) 08/06/2019   Dysphagia 08/06/2019   GERD (gastroesophageal reflux disease) 08/06/2019   Hypercalcemia 08/06/2019   Hypertension 08/06/2019   Migraines 08/06/2019   Osteoarthritis 08/06/2019   Folate deficiency 03/03/2019   Morbid obesity with BMI of 40.0-44.9, adult (HCC) 06/05/2018   Status post right rotator cuff repair 06/05/2018   Degenerative joint disease of right shoulder 05/24/2018   Degenerative tear of glenoid labrum, right 05/24/2018   Injury of tendon of long head of right biceps 05/24/2018   Nontraumatic complete tear of right rotator cuff 05/14/2018   Rotator cuff tendinitis, right 05/14/2018   Iron deficiency anemia 01/15/2018   B12 deficiency 09/07/2017   Microalbuminuric diabetic nephropathy (HCC) 09/07/2017   Primary osteoarthritis of right knee 02/19/2015   Restless leg syndrome 10/25/2012  Hyperlipidemia with target LDL less than 100 05/20/2012    PCP: Dalia Heading, MD  REFERRING PROVIDER: Dedra Skeens, PA-C  REFERRING DIAG: [phone] (ICD-10-CM) - Presence of right artificial knee joint   RATIONALE FOR EVALUATION AND TREATMENT: Rehabilitation  THERAPY DIAG: Acute pain of right knee  Stiffness of right knee, not elsewhere classified  Difficulty in walking, not elsewhere  classified  Muscle weakness (generalized)  ONSET DATE: R TKA 08/15/23  FOLLOW-UP APPT SCHEDULED WITH REFERRING PROVIDER: Not yet scheduled, staples removed yesterday   SUBJECTIVE:                                                                                                                                                                                         SUBJECTIVE STATEMENT:  Patient arrives to session with use of SPC and reports of  R knee stiffness. Reports 2/10 pain in R knee. Has been ambulating around the house with no AD.    PERTINENT HISTORY: Pt is a 62 year old female s/p R TKA 08/15/23. Hx of L TKA 11 years ago. Patient reports notable pain with staples in incision and attempting to bend knee - they were removed yesterday. Pt reports wearing shoes for first time Tuesday night and had notable pain from bunion in L foot. She is wearing thicker sock on L foot now and this seems to help. Patient reports using wide size shoes to accommodate her bunion. Patient has completed home health therapy. Per home health paperwork, SPPB 10/12, TUG 15.02 sec.   PAIN:   Pain Intensity: Present: 3/10, Best: 0/10, Worst: 10/10 Pain location: Posterior knee/popliteal region Pain quality: aching  Radiating pain: Yes , feels bruised into shin  Swelling: Yes   Numbness/Tingling: No Aggravating factors: bending knee, prolonged sitting Relieving factors: ice/cold compression machine,   History of prior back, hip, or knee injury, pain, surgery, or therapy: Yes; hx of bilateral bunion that is sypmtomatic   Imaging: Yes ;  CLINICAL DATA:  Postop knee replacement. DG 08/15/23   EXAM: PORTABLE RIGHT KNEE - 1-2 VIEW   COMPARISON:  None Available.   FINDINGS: Right knee arthroplasty in expected alignment. No periprosthetic lucency or fracture. There has been patellar resurfacing. Recent postsurgical change includes air and edema in the soft tissues and joint space. Anterior skin staples in  place.   IMPRESSION: Right knee arthroplasty without immediate postoperative complication.   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 No  Living Environment Lives with: lives with their spouse; her son stayed with pt first week after surgery. Husband is back to work. Pt is at home alone during work week at this time. Pt has sister who is hemiplegic who previously stayed with her.  Lives in: House/apartment Ramped entry, grab bar installed. Step-in shower. Bench in shower. Pt uses raised commode seat to sit down and get dressed. Concrete driveway to enter home.   Patient Goals: No assistive device, able to sit at her desk for work at her computer.    OBJECTIVE:   Patient Surveys  FOTO 41/58 predicted outcome score   Cognition Patient is oriented to person, place, and time.  Recent memory is intact.  Remote memory is intact.  Attention span and concentration are intact.  Expressive speech is intact.  Patient's fund of knowledge is within normal limits for educational level.    Gross Musculoskeletal Assessment Tremor: None Bulk: Atrophy of R>L quadriceps Tone: Normal No sign of acute infection   GAIT: Distance walked: 30 ft Assistive device utilized: Elodia Haviland - 2 wheeled Level of assistance: SBA Comments: Dec terminal knee extension, dec heel strike, dec step length R, dec stance time R/antalgic pattern   Posture:  AROM AROM (Normal range in degrees) AROM  Hip Right 08/31/23 Left 08/31/23  Flexion (125)    Extension (15)    Abduction (40)    Adduction     Internal Rotation (45)    External Rotation (45)         Knee    Flexion (135) 101 135  Extension (0) 0 +3      Ankle    Dorsiflexion (20) WNL WNL  Plantarflexion (50) WNL WNL  Inversion (35)    Eversion (15    (* = pain; Blank rows = not tested)  LE MMT: MMT (out of 5) Right 08/31/23 Left 08/31/23  Hip flexion 4- 4  Hip extension    Hip abduction (seated) 5 5  Hip adduction (seated) 5 5  Hip internal rotation    Hip external rotation    Knee flexion 5 5  Knee extension 4- 5  Ankle dorsiflexion    Ankle plantarflexion    Ankle inversion    Ankle eversion    (* = pain; Blank rows = not tested)  *Pt demonstrates SLR with no extensor lag   Sensation Deferred  Reflexes Deferred  Muscle Length Hamstrings: R: Positive L: Negative Quadriceps ([place]): R: Not examined L: Not examined   Palpation Location LEFT  RIGHT           Quadriceps  0  Medial Hamstrings  0  Lateral Hamstrings  0  Lateral Hamstring tendon  0  Medial Hamstring tendon  0  Quadriceps tendon  0  Patella  0  Patellar Tendon  1  Tibial Tuberosity  1  Medial joint line  0  Lateral joint line  0  MCL  0  LCL  0  Adductor Tubercle    Pes Anserine tendon    Infrapatellar fat pad    Fibular head    Popliteal fossa    TTP primarily along midline incision (1)  (Blank rows = not tested) Graded on 0-4 scale (0 = no pain, 1 = pain, 2 = pain with wincing/grimacing/flinching, 3 = pain with withdrawal, 4 = unwilling to allow palpation), (Blank rows = not tested)   VASCULAR Dorsalis pedis and  posterior tibial pulses are palpable No sign of acute DVT   SPECIAL TESTS Homans' Signs: R Negative, L Negative     TODAY'S TREATMENT: 09/05/23   Therapeutic Exercise - for HEP establishment, discussion on appropriate exercise/activity modification, PT education  Nustep level 3 (seat 8) x 8 minutes to improve L knee mobility and cardiorespiratory endurance.  Standing knee flexion stretch on 2nd step x 10 with 5 second hold Seated hamstring stretch 3 x 30  seconds  Seated LAQ 2# AW 2 x 10 each LE  Standing hip abduction 2# AW 2 x 10 each LE  Standing marching 2# AW 2 x 10 each LE   Standing hamstring curls 2#AW 2 x 10 each LE  TRX squats x 10   PATIENT EDUCATION:  Education details: see above for patient education details Person educated: Patient Education method: Explanation, Demonstration, and Handouts Education comprehension: verbalized understanding and returned demonstration   HOME EXERCISE PROGRAM:  Access Code: [email or web address] URL: [email or web address] Date: 08/31/2023 Prepared by: Consuela Mimes  Exercises - Seated Hamstring Stretch  - 2 x daily - 7 x weekly - 3 sets - 30sec hold - Active Straight Leg Raise with Quad Set  - 2 x daily - 7 x weekly - 2 sets - 10 reps   ASSESSMENT:  CLINICAL IMPRESSION:   Patient arrives to treatment session motivated to participate. Session focused on hamstring/knee flexion stretching along with LE strengthening with resistance. Tolerated treatment session well with reports of decreased pain to 1/10 at end of session. Pt will continue to benefit from skilled PT services to address deficits and improve function.  OBJECTIVE IMPAIRMENTS: Abnormal gait, decreased balance, decreased mobility, difficulty walking, decreased ROM, decreased strength, increased edema, impaired flexibility, and pain.   ACTIVITY LIMITATIONS: carrying, lifting, bending, sitting, squatting, stairs, transfers, bed mobility, and locomotion level  PARTICIPATION LIMITATIONS: meal prep, cleaning, laundry, driving, shopping, community activity, and occupation  PERSONAL FACTORS: Past/current experiences, complicated orthopedic history, and 3+ comorbidities: (Type 2 DM, diabetic nephropathy, anxiety, anemia, HTN, and aortic atherosclerosis) are also affecting patient's functional outcome.   REHAB POTENTIAL: Good  CLINICAL DECISION MAKING: Evolving/moderate complexity  EVALUATION COMPLEXITY:  Moderate   GOALS: Goals reviewed with patient? Yes  SHORT TERM GOALS: Target date: 09/21/2023  Pt will be independent with HEP to improve strength and decrease knee pain to improve pain-free function at home and work. Baseline: 08/31/23: Home health exercises reviewed; HEP updated and new handout given to pt.  Goal status: INITIAL  By 4 weeks pt will perform TUG in less than 14 seconds indicative of improved home mobility and reduced fall risk Baseline: 08/31/23: 15.02 per home health baseline Goal status: INITIAL   LONG TERM GOALS: Target date: 10/26/2023  Pt will increase FOTO to at least 58 to demonstrate significant improvement in function at home and work related to knee pain  Baseline: 08/31/23: 41 Goal status: INITIAL  2.  Pt will decrease worst knee pain no more than 2-3/10 on the NPRS in order to demonstrate clinically significant reduction in knee pain. Baseline: 08/31/23: 10/10 at worst Goal status: INITIAL  3.  Pt will demonstrate safe reciprocal negotiation of flight of stairs without LOB or buckling of post-op knee as needed for accessing second level of home to complete her workout routine (Bowflex on 2nd level) Baseline: 08/31/23: Patient requires cueing and demo for negotiation of 4 steps with FWW.  Goal status: INITIAL  4.  Pt will increase strength of R quadriceps and tested hip musculature to 4+/5  MMT grade or greater in order to demonstrate improvement in strength and function  Baseline: 08/31/23: Quads and hip flexors 4-/5 Goal status: INITIAL  5.  Pt will ambulate 660 feet or greater with no AD and no increase in knee pain without significant gait deviation or decreased stance time as needed for independent community-level ambulation  Baseline: 08/31/23: Pt ambulates at limited household distance with FWW. Goal status: INITIAL   PLAN: PT FREQUENCY: 1-2x/week  PT DURATION: 8 weeks  PLANNED INTERVENTIONS: Therapeutic exercises, Therapeutic activity,  Neuromuscular re-education, Balance training, Gait training, Patient/Family education, Self Care, Joint mobilization, Electrical stimulation, Cryotherapy, Moist heat, Manual therapy, and Re-evaluation.  PLAN FOR NEXT SESSION: Progress knee ROM as tolerated, low volume and low impact CKC activities, quad strengthening. Progress with gait training and AD wean as able.     Maylon Peppers, PT, DPT   Viviann Spare, PT 09/05/2023, 9:00 AM

## 2023-09-05 ENCOUNTER — Encounter: Payer: Self-pay | Admitting: Physical Therapy

## 2023-09-05 ENCOUNTER — Ambulatory Visit: Payer: BC Managed Care – PPO

## 2023-09-05 DIAGNOSIS — M25661 Stiffness of right knee, not elsewhere classified: Secondary | ICD-10-CM

## 2023-09-05 DIAGNOSIS — R262 Difficulty in walking, not elsewhere classified: Secondary | ICD-10-CM

## 2023-09-05 DIAGNOSIS — M25561 Pain in right knee: Secondary | ICD-10-CM | POA: Diagnosis not present

## 2023-09-05 DIAGNOSIS — M6281 Muscle weakness (generalized): Secondary | ICD-10-CM

## 2023-09-07 ENCOUNTER — Encounter: Payer: Self-pay | Admitting: Physical Therapy

## 2023-09-07 ENCOUNTER — Ambulatory Visit: Payer: BC Managed Care – PPO | Admitting: Physical Therapy

## 2023-09-07 DIAGNOSIS — R262 Difficulty in walking, not elsewhere classified: Secondary | ICD-10-CM

## 2023-09-07 DIAGNOSIS — M25561 Pain in right knee: Secondary | ICD-10-CM

## 2023-09-07 DIAGNOSIS — M25661 Stiffness of right knee, not elsewhere classified: Secondary | ICD-10-CM

## 2023-09-07 DIAGNOSIS — M6281 Muscle weakness (generalized): Secondary | ICD-10-CM

## 2023-09-07 NOTE — Therapy (Signed)
OUTPATIENT PHYSICAL THERAPY TREATMENT  Patient Name: Andrea Zhang MRN: [id number] DOB:September 23, 1961, 62 y.o., female Today's Date: 09/07/2023  END OF SESSION:  PT End of Session - 09/07/23 0858     Visit Number 3    Number of Visits 17    Date for PT Re-Evaluation 10/26/23    PT Start Time 0901    PT Stop Time 0946    PT Time Calculation (min) 45 min    Activity Tolerance Patient tolerated treatment well    Behavior During Therapy [facility] for tasks assessed/performed               Past Medical History:  Diagnosis Date   Anemia    iron deficiency and b12 deficiency   Anxiety    Aortic atherosclerosis (HCC)    Arthritis    Asthma    Diabetes mellitus without complication (HCC)    Fasciitis    left foot   GERD (gastroesophageal reflux disease)    History of kidney stones    Hypercholesterolemia    Hypertension    Migraines    MIGRAINES HAVE IMPROVED SINCE RECEIVING IRON   Pneumonia    Primary osteoarthritis of right knee    Tachycardia    Past Surgical History:  Procedure Laterality Date   CHOLECYSTECTOMY  2004   COLONOSCOPY WITH ESOPHAGOGASTRODUODENOSCOPY (EGD)  02/2018   ESOPHAGOGASTRODUODENOSCOPY N/A 10/16/2020   Procedure: ESOPHAGOGASTRODUODENOSCOPY (EGD);  Surgeon: Regis Bill, MD;  Location: [facility] ENDOSCOPY;  Service: Endoscopy;  Laterality: N/A;   ESOPHAGOGASTRODUODENOSCOPY (EGD) WITH PROPOFOL N/A 06/06/2016   Procedure: ESOPHAGOGASTRODUODENOSCOPY (EGD) WITH PROPOFOL;  Surgeon: Scot Jun, MD;  Location: [facility] ENDOSCOPY;  Service: Endoscopy;  Laterality: N/A;   EXTRACORPOREAL SHOCK WAVE LITHOTRIPSY  2010   JOINT REPLACEMENT  2013   LT TKR   SAVORY DILATION N/A 06/06/2016   Procedure: SAVORY DILATION;  Surgeon: Scot Jun, MD;  Location: [facility] ENDOSCOPY;  Service: Endoscopy;  Laterality: N/A;   SAVORY DILATION  02/2018   SHOULDER ARTHROSCOPY WITH OPEN ROTATOR CUFF REPAIR Right 05/24/2018   Procedure: right shoulder arthroscopy, extensive  arthroscopic debridement, decompression, open rotator cuff repair, biceps tenodesis;  Surgeon: Christena Flake, MD;  Location: [facility] ORS;  Service: Orthopedics;  Laterality: Right;   TONSILLECTOMY  1978   TOTAL KNEE ARTHROPLASTY Right 08/15/2023   Procedure: TOTAL KNEE ARTHROPLASTY;  Surgeon: Christena Flake, MD;  Location: [facility] ORS;  Service: Orthopedics;  Laterality: Right;   Patient Active Problem List   Diagnosis Date Noted   Status post total knee replacement using cement, right 08/15/2023   Coronary artery calcification 09/09/2019   Aortic atherosclerosis (HCC) 09/09/2019   Allergic rhinitis 08/06/2019   Asthma 08/06/2019   Diabetes mellitus, type 2 (HCC) 08/06/2019   Dysphagia 08/06/2019   GERD (gastroesophageal reflux disease) 08/06/2019   Hypercalcemia 08/06/2019   Hypertension 08/06/2019   Migraines 08/06/2019   Osteoarthritis 08/06/2019   Folate deficiency 03/03/2019   Morbid obesity with BMI of 40.0-44.9, adult (HCC) 06/05/2018   Status post right rotator cuff repair 06/05/2018   Degenerative joint disease of right shoulder 05/24/2018   Degenerative tear of glenoid labrum, right 05/24/2018   Injury of tendon of long head of right biceps 05/24/2018   Nontraumatic complete tear of right rotator cuff 05/14/2018   Rotator cuff tendinitis, right 05/14/2018   Iron deficiency anemia 01/15/2018   B12 deficiency 09/07/2017   Microalbuminuric diabetic nephropathy (HCC) 09/07/2017   Primary osteoarthritis of right knee 02/19/2015   Restless leg syndrome 10/25/2012  Hyperlipidemia with target LDL less than 100 05/20/2012    PCP: Dalia Heading, MD  REFERRING PROVIDER: Dedra Skeens, PA-C  REFERRING DIAG: [phone] (ICD-10-CM) - Presence of right artificial knee joint   RATIONALE FOR EVALUATION AND TREATMENT: Rehabilitation  THERAPY DIAG: Acute pain of right knee  Stiffness of right knee, not elsewhere classified  Difficulty in walking, not elsewhere classified  Muscle  weakness (generalized)  ONSET DATE: R TKA 08/15/23  FOLLOW-UP APPT SCHEDULED WITH REFERRING PROVIDER: Not yet scheduled, staples removed yesterday   PERTINENT HISTORY: Pt is a 62 year old female s/p R TKA 08/15/23. Hx of L TKA 11 years ago. Patient reports notable pain with staples in incision and attempting to bend knee - they were removed yesterday. Pt reports wearing shoes for first time Tuesday night and had notable pain from bunion in L foot. She is wearing thicker sock on L foot now and this seems to help. Patient reports using wide size shoes to accommodate her bunion. Patient has completed home health therapy. Per home health paperwork, SPPB 10/12, TUG 15.02 sec.   PAIN:   Pain Intensity: Present: 3/10, Best: 0/10, Worst: 10/10 Pain location: Posterior knee/popliteal region Pain quality: aching  Radiating pain: Yes , feels bruised into shin  Swelling: Yes   Numbness/Tingling: No Aggravating factors: bending knee, prolonged sitting Relieving factors: ice/cold compression machine,   History of prior back, hip, or knee injury, pain, surgery, or therapy: Yes; hx of bilateral bunion that is sypmtomatic   Imaging: Yes ;  CLINICAL DATA:  Postop knee replacement. DG 08/15/23   EXAM: PORTABLE RIGHT KNEE - 1-2 VIEW   COMPARISON:  None Available.   FINDINGS: Right knee arthroplasty in expected alignment. No periprosthetic lucency or fracture. There has been patellar resurfacing. Recent postsurgical change includes air and edema in the soft tissues and joint space. Anterior skin staples in place.   IMPRESSION: Right knee arthroplasty without immediate postoperative complication.   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 No  Living Environment Lives with: lives with their spouse; her son stayed with pt first week after surgery. Husband is back to work. Pt is at home alone during work week at this time. Pt has sister who is hemiplegic who previously stayed with her.  Lives in: House/apartment Ramped entry, grab bar installed. Step-in shower. Bench in shower. Pt uses raised commode seat to sit down and get dressed. Concrete driveway to enter home.   Patient Goals: No assistive device, able to sit at her desk for work at her computer.    OBJECTIVE:     Gross Musculoskeletal Assessment Tremor: None Bulk: Atrophy of R>L quadriceps Tone: Normal No sign of acute infection   GAIT: Distance walked: 30 ft Assistive device utilized: Walker - 2 wheeled Level of assistance: SBA Comments: Dec terminal knee extension, dec heel strike, dec step length R, dec stance time R/antalgic pattern    AROM AROM (Normal range in degrees) AROM  Hip Right 08/31/23 Left 08/31/23  Flexion (125)    Extension (15)    Abduction (40)    Adduction     Internal Rotation (45)    External Rotation (45)  Knee    Flexion (135) 101 135  Extension (0) 0 +3      Ankle    Dorsiflexion (20) WNL WNL  Plantarflexion (50) WNL WNL  Inversion (35)    Eversion (15    (* = pain; Blank rows = not tested)  LE MMT: MMT (out of 5) Right 08/31/23 Left 08/31/23  Hip flexion 4- 4  Hip extension    Hip abduction (seated) 5 5  Hip adduction (seated) 5 5  Hip internal rotation    Hip external rotation    Knee flexion 5 5  Knee extension 4- 5  Ankle dorsiflexion    Ankle plantarflexion    Ankle inversion    Ankle eversion    (* = pain; Blank rows = not tested)  *Pt demonstrates SLR with no extensor lag   Sensation Deferred  Reflexes Deferred  Muscle Length Hamstrings: R: Positive L: Negative Quadriceps  ([place]): R: Not examined L: Not examined    TODAY'S TREATMENT: 09/07/23    SUBJECTIVE STATEMENT:   Pt reports fleeting pain and popping across knee joint. Patient reports some sciatic-type symptoms some this AM affecting posterior thigh and up to R gluteal region. Patient reports 1/10 pain at arrival to PT. Patient reports not being treated for sciatica before.     Manual Therapy - for symptom modulation, soft tissue sensitivity and mobility, joint mobility, ROM  Patellar mobilization; gr III; 2 x 30 sec with emphasis on superior and inferior L Knee PROM within pt tolerance; x 15 repetitions moving into flexion and extension as tolerated     Therapeutic Exercise - for HEP establishment, discussion on appropriate exercise/activity modification, PT education  NuStep level 3 (seat 6) x 7 minutes to improve L knee mobility and cardiorespiratory endurance.   Supine SAQ with blue bolster under foot; 2x10, 3 second hold   -at 3# AQ next visit   Seated LAQ 3# AW 2 x 10 each LE  Standing hip abduction 3# AW 2 x 10 each LE  Standing marching 3# AW 2 x 10 each LE    Dynamic march in // bars; 4x D/B   PATIENT EDUCATION: Reviewed sciatic nerve flossing technique for home and possible etiology for sciatic-type pain   *next visit* Standing knee flexion stretch on 2nd step x 10 with 5 second hold TRX squats x 10    *not today* Standing hamstring curls 2#AW 2 x 10 each LE  Seated hamstring stretch 3 x 30 seconds     Cold pack (unbilled) - for anti-inflammatory and analgesic effect as needed for reduced pain and improved ability to participate in active PT intervention, along R knee in supine with 2 pillows under R calf to elevate lower limb slightly, x 5 minutes   PATIENT EDUCATION:  Education details: see above for patient education details Person educated: Patient Education method: Explanation, Demonstration, and Handouts Education comprehension: verbalized understanding and  returned demonstration   HOME EXERCISE PROGRAM:  Access Code: [email or web address] URL: [email or web address] Date: 08/31/2023 Prepared by: Consuela Mimes  Exercises - Seated Hamstring Stretch  - 2 x daily - 7 x weekly - 3 sets - 30sec hold - Active Straight Leg Raise with Quad Set  - 2 x daily - 7 x weekly - 2 sets - 10 reps   ASSESSMENT:  CLINICAL IMPRESSION:   Patient demonstrates excellent progression with R knee ROM - up to 110 deg without aggressive stretching or manual overpressure. She has moderate hypomobility of patellar superior/inferior glide. Intermittent pain  with loading response onto RLE mainly along medial joint line (primary region of pain prior to TKA). Pt has some bruising that has pooled into proximal calf. No calf tenderness or signs of DVT at this time. Pt educated on signs of DVT for which to seek out immediate care if they occurred. Pt demonstrates notably improving gait pattern and minimal reliance on AD for home-level gait.  Pt will continue to benefit from skilled PT services to address deficits and improve function.  OBJECTIVE IMPAIRMENTS: Abnormal gait, decreased balance, decreased mobility, difficulty walking, decreased ROM, decreased strength, increased edema, impaired flexibility, and pain.   ACTIVITY LIMITATIONS: carrying, lifting, bending, sitting, squatting, stairs, transfers, bed mobility, and locomotion level  PARTICIPATION LIMITATIONS: meal prep, cleaning, laundry, driving, shopping, community activity, and occupation  PERSONAL FACTORS: Past/current experiences, complicated orthopedic history, and 3+ comorbidities: (Type 2 DM, diabetic nephropathy, anxiety, anemia, HTN, and aortic atherosclerosis) are also affecting patient's functional outcome.   REHAB POTENTIAL: Good  CLINICAL DECISION MAKING: Evolving/moderate complexity  EVALUATION COMPLEXITY: Moderate   GOALS: Goals reviewed with patient? Yes  SHORT TERM GOALS: Target date:  09/21/2023  Pt will be independent with HEP to improve strength and decrease knee pain to improve pain-free function at home and work. Baseline: 08/31/23: Home health exercises reviewed; HEP updated and new handout given to pt.  Goal status: INITIAL  By 4 weeks pt will perform TUG in less than 14 seconds indicative of improved home mobility and reduced fall risk Baseline: 08/31/23: 15.02 per home health baseline Goal status: INITIAL   LONG TERM GOALS: Target date: 10/26/2023  Pt will increase FOTO to at least 58 to demonstrate significant improvement in function at home and work related to knee pain  Baseline: 08/31/23: 41 Goal status: INITIAL  2.  Pt will decrease worst knee pain no more than 2-3/10 on the NPRS in order to demonstrate clinically significant reduction in knee pain. Baseline: 08/31/23: 10/10 at worst Goal status: INITIAL  3.  Pt will demonstrate safe reciprocal negotiation of flight of stairs without LOB or buckling of post-op knee as needed for accessing second level of home to complete her workout routine (Bowflex on 2nd level) Baseline: 08/31/23: Patient requires cueing and demo for negotiation of 4 steps with FWW.  Goal status: INITIAL  4.  Pt will increase strength of R quadriceps and tested hip musculature to 4+/5 MMT grade or greater in order to demonstrate improvement in strength and function  Baseline: 08/31/23: Quads and hip flexors 4-/5 Goal status: INITIAL  5.  Pt will ambulate 660 feet or greater with no AD and no increase in knee pain without significant gait deviation or decreased stance time as needed for independent community-level ambulation  Baseline: 08/31/23: Pt ambulates at limited household distance with FWW. Goal status: INITIAL   PLAN: PT FREQUENCY: 1-2x/week  PT DURATION: 8 weeks  PLANNED INTERVENTIONS: Therapeutic exercises, Therapeutic activity, Neuromuscular re-education, Balance training, Gait training, Patient/Family education,  Self Care, Joint mobilization, Electrical stimulation, Cryotherapy, Moist heat, Manual therapy, and Re-evaluation.  PLAN FOR NEXT SESSION: Progress knee ROM as tolerated, low volume and low impact CKC activities, quad strengthening. Progress with gait training and AD wean as able.     Consuela Mimes, PT, DPT #[id number]  Gertie Exon, PT 09/07/2023, 10:04 AM

## 2023-09-11 NOTE — Therapy (Unsigned)
OUTPATIENT PHYSICAL THERAPY TREATMENT  Patient Name: Andrea Zhang MRN: [id number] DOB:01/09/1961, 62 y.o., female Today's Date: 09/12/2023  END OF SESSION:  PT End of Session - 09/12/23 0907     Visit Number 4    Number of Visits 17    Date for PT Re-Evaluation 10/26/23    PT Start Time 0903    PT Stop Time 0942    PT Time Calculation (min) 39 min    Activity Tolerance Patient tolerated treatment well    Behavior During Therapy [facility] for tasks assessed/performed              Past Medical History:  Diagnosis Date   Anemia    iron deficiency and b12 deficiency   Anxiety    Aortic atherosclerosis (HCC)    Arthritis    Asthma    Diabetes mellitus without complication (HCC)    Fasciitis    left foot   GERD (gastroesophageal reflux disease)    History of kidney stones    Hypercholesterolemia    Hypertension    Migraines    MIGRAINES HAVE IMPROVED SINCE RECEIVING IRON   Pneumonia    Primary osteoarthritis of right knee    Tachycardia    Past Surgical History:  Procedure Laterality Date   CHOLECYSTECTOMY  2004   COLONOSCOPY WITH ESOPHAGOGASTRODUODENOSCOPY (EGD)  02/2018   ESOPHAGOGASTRODUODENOSCOPY N/A 10/16/2020   Procedure: ESOPHAGOGASTRODUODENOSCOPY (EGD);  Surgeon: Regis Bill, MD;  Location: [facility] ENDOSCOPY;  Service: Endoscopy;  Laterality: N/A;   ESOPHAGOGASTRODUODENOSCOPY (EGD) WITH PROPOFOL N/A 06/06/2016   Procedure: ESOPHAGOGASTRODUODENOSCOPY (EGD) WITH PROPOFOL;  Surgeon: Scot Jun, MD;  Location: [facility] ENDOSCOPY;  Service: Endoscopy;  Laterality: N/A;   EXTRACORPOREAL SHOCK WAVE LITHOTRIPSY  2010   JOINT REPLACEMENT  2013   LT TKR   SAVORY DILATION N/A 06/06/2016   Procedure: SAVORY DILATION;  Surgeon: Scot Jun, MD;  Location: [facility] ENDOSCOPY;  Service: Endoscopy;  Laterality: N/A;   SAVORY DILATION  02/2018   SHOULDER ARTHROSCOPY WITH OPEN ROTATOR CUFF REPAIR Right 05/24/2018   Procedure: right shoulder arthroscopy, extensive  arthroscopic debridement, decompression, open rotator cuff repair, biceps tenodesis;  Surgeon: Christena Flake, MD;  Location: [facility] ORS;  Service: Orthopedics;  Laterality: Right;   TONSILLECTOMY  1978   TOTAL KNEE ARTHROPLASTY Right 08/15/2023   Procedure: TOTAL KNEE ARTHROPLASTY;  Surgeon: Christena Flake, MD;  Location: [facility] ORS;  Service: Orthopedics;  Laterality: Right;   Patient Active Problem List   Diagnosis Date Noted   Status post total knee replacement using cement, right 08/15/2023   Coronary artery calcification 09/09/2019   Aortic atherosclerosis (HCC) 09/09/2019   Allergic rhinitis 08/06/2019   Asthma 08/06/2019   Diabetes mellitus, type 2 (HCC) 08/06/2019   Dysphagia 08/06/2019   GERD (gastroesophageal reflux disease) 08/06/2019   Hypercalcemia 08/06/2019   Hypertension 08/06/2019   Migraines 08/06/2019   Osteoarthritis 08/06/2019   Folate deficiency 03/03/2019   Morbid obesity with BMI of 40.0-44.9, adult (HCC) 06/05/2018   Status post right rotator cuff repair 06/05/2018   Degenerative joint disease of right shoulder 05/24/2018   Degenerative tear of glenoid labrum, right 05/24/2018   Injury of tendon of long head of right biceps 05/24/2018   Nontraumatic complete tear of right rotator cuff 05/14/2018   Rotator cuff tendinitis, right 05/14/2018   Iron deficiency anemia 01/15/2018   B12 deficiency 09/07/2017   Microalbuminuric diabetic nephropathy (HCC) 09/07/2017   Primary osteoarthritis of right knee 02/19/2015   Restless leg syndrome 10/25/2012  Hyperlipidemia with target LDL less than 100 05/20/2012    PCP: Dalia Heading, MD  REFERRING PROVIDER: Dedra Skeens, PA-C  REFERRING DIAG: [phone] (ICD-10-CM) - Presence of right artificial knee joint   RATIONALE FOR EVALUATION AND TREATMENT: Rehabilitation  THERAPY DIAG: Acute pain of right knee  Stiffness of right knee, not elsewhere classified  Difficulty in walking, not elsewhere classified  Muscle  weakness (generalized)  ONSET DATE: R TKA 08/15/23  FOLLOW-UP APPT SCHEDULED WITH REFERRING PROVIDER: Not yet scheduled, staples removed yesterday   PERTINENT HISTORY: Pt is a 62 year old female s/p R TKA 08/15/23. Hx of L TKA 11 years ago. Patient reports notable pain with staples in incision and attempting to bend knee - they were removed yesterday. Pt reports wearing shoes for first time Tuesday night and had notable pain from bunion in L foot. She is wearing thicker sock on L foot now and this seems to help. Patient reports using wide size shoes to accommodate her bunion. Patient has completed home health therapy. Per home health paperwork, SPPB 10/12, TUG 15.02 sec.   PAIN:   Pain Intensity: Present: 3/10, Best: 0/10, Worst: 10/10 Pain location: Posterior knee/popliteal region Pain quality: aching  Radiating pain: Yes , feels bruised into shin  Swelling: Yes   Numbness/Tingling: No Aggravating factors: bending knee, prolonged sitting Relieving factors: ice/cold compression machine,   History of prior back, hip, or knee injury, pain, surgery, or therapy: Yes; hx of bilateral bunion that is sypmtomatic   Imaging: Yes ;  CLINICAL DATA:  Postop knee replacement. DG 08/15/23   EXAM: PORTABLE RIGHT KNEE - 1-2 VIEW   COMPARISON:  None Available.   FINDINGS: Right knee arthroplasty in expected alignment. No periprosthetic lucency or fracture. There has been patellar resurfacing. Recent postsurgical change includes air and edema in the soft tissues and joint space. Anterior skin staples in place.   IMPRESSION: Right knee arthroplasty without immediate postoperative complication.   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 No  Living Environment Lives with: lives with their spouse; her son stayed with pt first week after surgery. Husband is back to work. Pt is at home alone during work week at this time. Pt has sister who is hemiplegic who previously stayed with her.  Lives in: House/apartment Ramped entry, grab bar installed. Step-in shower. Bench in shower. Pt uses raised commode seat to sit down and get dressed. Concrete driveway to enter home.   Patient Goals: No assistive device, able to sit at her desk for work at her computer.    OBJECTIVE:     Gross Musculoskeletal Assessment Tremor: None Bulk: Atrophy of R>L quadriceps Tone: Normal No sign of acute infection   GAIT: Distance walked: 30 ft Assistive device utilized: Walker - 2 wheeled Level of assistance: SBA Comments: Dec terminal knee extension, dec heel strike, dec step length R, dec stance time R/antalgic pattern    AROM AROM (Normal range in degrees) AROM  Hip Right 08/31/23 Left 08/31/23  Flexion (125)    Extension (15)    Abduction (40)    Adduction     Internal Rotation (45)    External Rotation (45)  Knee    Flexion (135) 101 135  Extension (0) 0 +3      Ankle    Dorsiflexion (20) WNL WNL  Plantarflexion (50) WNL WNL  Inversion (35)    Eversion (15    (* = pain; Blank rows = not tested)  LE MMT: MMT (out of 5) Right 08/31/23 Left 08/31/23  Hip flexion 4- 4  Hip extension    Hip abduction (seated) 5 5  Hip adduction (seated) 5 5  Hip internal rotation    Hip external rotation    Knee flexion 5 5  Knee extension 4- 5  Ankle dorsiflexion    Ankle plantarflexion    Ankle inversion    Ankle eversion    (* = pain; Blank rows = not tested)  *Pt demonstrates SLR with no extensor lag   Sensation Deferred  Reflexes Deferred  Muscle Length Hamstrings: R: Positive L: Negative Quadriceps  ([place]): R: Not examined L: Not examined     TODAY'S TREATMENT: 09/12/23    SUBJECTIVE STATEMENT:   Pt reports 3/10 pain at arrival. She reports having minimal pain during the night when waking up, but more aching/soreness in AM. She reports compliance with HEP. She reports stressors related to her son having bad seizure on 09/07/23.     Manual Therapy - for symptom modulation, soft tissue sensitivity and mobility, joint mobility, ROM  Patellar mobilization; gr III; 2 x 30 sec with emphasis on superior and inferior  -pt reports pain with R knee lying flat and direct pressure onto anterior knee  R Knee PROM within pt tolerance; x 10 repetitions moving into flexion and extension as tolerated    R knee PROM -2-113    Therapeutic Exercise - for knee ROM, strengthening to improve ability to perform closed-chain functional movements e.g. transfers, squatting, stair negotiation  NuStep level 3 (seat 5) x 7 minutes to improve L knee mobility and cardiorespiratory endurance.   Supine SAQ with blue bolster under foot; 2x10, 3 second hold; 3# AW  Seated LAQ 3# AW 2 x 10 each LE;  Dynamic march in // bars; 4x D/B, with 3# AW on each LE  Standing knee flexion stretch on 2nd step x 10 with 5 second hold   Forward to retro step on blue agility ladder; 4x D/B for terminal knee extension and gait stability   PATIENT EDUCATION:  Discussed current progress, prognosis, continued POC   *next visit* TRX squats; 2 x 10, depth to tolerance    *not today* Standing marching 3# AW 2 x 10 each LE   Standing hip abduction 3# AW 2 x 10 each LE  Standing hamstring curls 2#AW 2 x 10 each LE  Seated hamstring stretch 3 x 30 seconds  Cold pack (unbilled) - for anti-inflammatory and analgesic effect as needed for reduced pain and improved ability to participate in active PT intervention, along R knee in supine with 2 pillows under R calf to elevate lower limb slightly, x 5 minutes   PATIENT  EDUCATION:  Education details: see above for patient education details Person educated: Patient Education method: Explanation, Demonstration, and Handouts Education comprehension: verbalized understanding and returned demonstration   HOME EXERCISE PROGRAM:  Access Code: [email or web address] URL: [email or web address] Date: 08/31/2023 Prepared by: Consuela Mimes  Exercises - Seated Hamstring Stretch  - 2 x daily - 7 x weekly - 3 sets - 30sec hold - Active Straight Leg Raise with Quad Set  - 2 x daily - 7 x weekly - 2  sets - 10 reps   ASSESSMENT:  CLINICAL IMPRESSION:   Patient demonstrates improving gait pattern with only mild dec stance time on RLE during ambulation with no assistive device. She is approaching [facility] for knee flexion ROM and only has mild active knee extension deficit < 5 degrees. She is able to attain [facility] terminal knee extension with retro-stepping today. She is making excellent progress 4 weeks post-op. Pt will continue to benefit from skilled PT services to address deficits and improve function.  OBJECTIVE IMPAIRMENTS: Abnormal gait, decreased balance, decreased mobility, difficulty walking, decreased ROM, decreased strength, increased edema, impaired flexibility, and pain.   ACTIVITY LIMITATIONS: carrying, lifting, bending, sitting, squatting, stairs, transfers, bed mobility, and locomotion level  PARTICIPATION LIMITATIONS: meal prep, cleaning, laundry, driving, shopping, community activity, and occupation  PERSONAL FACTORS: Past/current experiences, complicated orthopedic history, and 3+ comorbidities: (Type 2 DM, diabetic nephropathy, anxiety, anemia, HTN, and aortic atherosclerosis) are also affecting patient's functional outcome.   REHAB POTENTIAL: Good  CLINICAL DECISION MAKING: Evolving/moderate complexity  EVALUATION COMPLEXITY: Moderate   GOALS: Goals reviewed with patient? Yes  SHORT TERM GOALS: Target date: 09/21/2023  Pt will be independent  with HEP to improve strength and decrease knee pain to improve pain-free function at home and work. Baseline: 08/31/23: Home health exercises reviewed; HEP updated and new handout given to pt.  Goal status: INITIAL  By 4 weeks pt will perform TUG in less than 14 seconds indicative of improved home mobility and reduced fall risk Baseline: 08/31/23: 15.02 per home health baseline Goal status: INITIAL   LONG TERM GOALS: Target date: 10/26/2023  Pt will increase FOTO to at least 58 to demonstrate significant improvement in function at home and work related to knee pain  Baseline: 08/31/23: 41 Goal status: INITIAL  2.  Pt will decrease worst knee pain no more than 2-3/10 on the NPRS in order to demonstrate clinically significant reduction in knee pain. Baseline: 08/31/23: 10/10 at worst Goal status: INITIAL  3.  Pt will demonstrate safe reciprocal negotiation of flight of stairs without LOB or buckling of post-op knee as needed for accessing second level of home to complete her workout routine (Bowflex on 2nd level) Baseline: 08/31/23: Patient requires cueing and demo for negotiation of 4 steps with FWW.  Goal status: INITIAL  4.  Pt will increase strength of R quadriceps and tested hip musculature to 4+/5 MMT grade or greater in order to demonstrate improvement in strength and function  Baseline: 08/31/23: Quads and hip flexors 4-/5 Goal status: INITIAL  5.  Pt will ambulate 660 feet or greater with no AD and no increase in knee pain without significant gait deviation or decreased stance time as needed for independent community-level ambulation  Baseline: 08/31/23: Pt ambulates at limited household distance with FWW. Goal status: INITIAL   PLAN: PT FREQUENCY: 1-2x/week  PT DURATION: 8 weeks  PLANNED INTERVENTIONS: Therapeutic exercises, Therapeutic activity, Neuromuscular re-education, Balance training, Gait training, Patient/Family education, Self Care, Joint mobilization,  Electrical stimulation, Cryotherapy, Moist heat, Manual therapy, and Re-evaluation.  PLAN FOR NEXT SESSION: Progress knee ROM as tolerated, low volume and low impact CKC activities, quad strengthening. Progress with gait training and AD wean as able.     Consuela Mimes, PT, DPT #[id number]  Gertie Exon, PT 09/12/2023, 11:04 AM

## 2023-09-12 ENCOUNTER — Encounter: Payer: Self-pay | Admitting: Physical Therapy

## 2023-09-12 ENCOUNTER — Ambulatory Visit: Payer: BC Managed Care – PPO | Attending: Orthopedic Surgery | Admitting: Physical Therapy

## 2023-09-12 DIAGNOSIS — M25661 Stiffness of right knee, not elsewhere classified: Secondary | ICD-10-CM | POA: Diagnosis present

## 2023-09-12 DIAGNOSIS — R262 Difficulty in walking, not elsewhere classified: Secondary | ICD-10-CM | POA: Insufficient documentation

## 2023-09-12 DIAGNOSIS — M6281 Muscle weakness (generalized): Secondary | ICD-10-CM | POA: Diagnosis present

## 2023-09-12 DIAGNOSIS — G8929 Other chronic pain: Secondary | ICD-10-CM | POA: Diagnosis present

## 2023-09-12 DIAGNOSIS — M25561 Pain in right knee: Secondary | ICD-10-CM | POA: Insufficient documentation

## 2023-09-14 ENCOUNTER — Encounter: Payer: Self-pay | Admitting: Physical Therapy

## 2023-09-14 ENCOUNTER — Ambulatory Visit: Payer: BC Managed Care – PPO | Admitting: Physical Therapy

## 2023-09-14 DIAGNOSIS — M25561 Pain in right knee: Secondary | ICD-10-CM | POA: Diagnosis not present

## 2023-09-14 DIAGNOSIS — R262 Difficulty in walking, not elsewhere classified: Secondary | ICD-10-CM

## 2023-09-14 DIAGNOSIS — M25661 Stiffness of right knee, not elsewhere classified: Secondary | ICD-10-CM

## 2023-09-14 DIAGNOSIS — M6281 Muscle weakness (generalized): Secondary | ICD-10-CM

## 2023-09-14 NOTE — Therapy (Signed)
OUTPATIENT PHYSICAL THERAPY TREATMENT  Patient Name: Andrea Zhang MRN: [id number] DOB:1961/05/29, 62 y.o., female Today's Date: 09/14/2023  END OF SESSION:  PT End of Session - 09/14/23 0859     Visit Number 5    Number of Visits 17    Date for PT Re-Evaluation 10/26/23    PT Start Time 0859    PT Stop Time 0944    PT Time Calculation (min) 45 min    Activity Tolerance Patient tolerated treatment well    Behavior During Therapy [facility] for tasks assessed/performed              Past Medical History:  Diagnosis Date   Anemia    iron deficiency and b12 deficiency   Anxiety    Aortic atherosclerosis (HCC)    Arthritis    Asthma    Diabetes mellitus without complication (HCC)    Fasciitis    left foot   GERD (gastroesophageal reflux disease)    History of kidney stones    Hypercholesterolemia    Hypertension    Migraines    MIGRAINES HAVE IMPROVED SINCE RECEIVING IRON   Pneumonia    Primary osteoarthritis of right knee    Tachycardia    Past Surgical History:  Procedure Laterality Date   CHOLECYSTECTOMY  2004   COLONOSCOPY WITH ESOPHAGOGASTRODUODENOSCOPY (EGD)  02/2018   ESOPHAGOGASTRODUODENOSCOPY N/A 10/16/2020   Procedure: ESOPHAGOGASTRODUODENOSCOPY (EGD);  Surgeon: Regis Bill, MD;  Location: [facility] ENDOSCOPY;  Service: Endoscopy;  Laterality: N/A;   ESOPHAGOGASTRODUODENOSCOPY (EGD) WITH PROPOFOL N/A 06/06/2016   Procedure: ESOPHAGOGASTRODUODENOSCOPY (EGD) WITH PROPOFOL;  Surgeon: Scot Jun, MD;  Location: [facility] ENDOSCOPY;  Service: Endoscopy;  Laterality: N/A;   EXTRACORPOREAL SHOCK WAVE LITHOTRIPSY  2010   JOINT REPLACEMENT  2013   LT TKR   SAVORY DILATION N/A 06/06/2016   Procedure: SAVORY DILATION;  Surgeon: Scot Jun, MD;  Location: [facility] ENDOSCOPY;  Service: Endoscopy;  Laterality: N/A;   SAVORY DILATION  02/2018   SHOULDER ARTHROSCOPY WITH OPEN ROTATOR CUFF REPAIR Right 05/24/2018   Procedure: right shoulder arthroscopy, extensive  arthroscopic debridement, decompression, open rotator cuff repair, biceps tenodesis;  Surgeon: Christena Flake, MD;  Location: [facility] ORS;  Service: Orthopedics;  Laterality: Right;   TONSILLECTOMY  1978   TOTAL KNEE ARTHROPLASTY Right 08/15/2023   Procedure: TOTAL KNEE ARTHROPLASTY;  Surgeon: Christena Flake, MD;  Location: [facility] ORS;  Service: Orthopedics;  Laterality: Right;   Patient Active Problem List   Diagnosis Date Noted   Status post total knee replacement using cement, right 08/15/2023   Coronary artery calcification 09/09/2019   Aortic atherosclerosis (HCC) 09/09/2019   Allergic rhinitis 08/06/2019   Asthma 08/06/2019   Diabetes mellitus, type 2 (HCC) 08/06/2019   Dysphagia 08/06/2019   GERD (gastroesophageal reflux disease) 08/06/2019   Hypercalcemia 08/06/2019   Hypertension 08/06/2019   Migraines 08/06/2019   Osteoarthritis 08/06/2019   Folate deficiency 03/03/2019   Morbid obesity with BMI of 40.0-44.9, adult (HCC) 06/05/2018   Status post right rotator cuff repair 06/05/2018   Degenerative joint disease of right shoulder 05/24/2018   Degenerative tear of glenoid labrum, right 05/24/2018   Injury of tendon of long head of right biceps 05/24/2018   Nontraumatic complete tear of right rotator cuff 05/14/2018   Rotator cuff tendinitis, right 05/14/2018   Iron deficiency anemia 01/15/2018   B12 deficiency 09/07/2017   Microalbuminuric diabetic nephropathy (HCC) 09/07/2017   Primary osteoarthritis of right knee 02/19/2015   Restless leg syndrome 10/25/2012  Hyperlipidemia with target LDL less than 100 05/20/2012    PCP: Dalia Heading, MD  REFERRING PROVIDER: Dedra Skeens, PA-C  REFERRING DIAG: [phone] (ICD-10-CM) - Presence of right artificial knee joint   RATIONALE FOR EVALUATION AND TREATMENT: Rehabilitation  THERAPY DIAG: Acute pain of right knee  Stiffness of right knee, not elsewhere classified  Difficulty in walking, not elsewhere classified  Muscle  weakness (generalized)  ONSET DATE: R TKA 08/15/23  FOLLOW-UP APPT SCHEDULED WITH REFERRING PROVIDER: Not yet scheduled, staples removed yesterday   PERTINENT HISTORY: Pt is a 62 year old female s/p R TKA 08/15/23. Hx of L TKA 11 years ago. Patient reports notable pain with staples in incision and attempting to bend knee - they were removed yesterday. Pt reports wearing shoes for first time Tuesday night and had notable pain from bunion in L foot. She is wearing thicker sock on L foot now and this seems to help. Patient reports using wide size shoes to accommodate her bunion. Patient has completed home health therapy. Per home health paperwork, SPPB 10/12, TUG 15.02 sec.   PAIN:   Pain Intensity: Present: 3/10, Best: 0/10, Worst: 10/10 Pain location: Posterior knee/popliteal region Pain quality: aching  Radiating pain: Yes , feels bruised into shin  Swelling: Yes   Numbness/Tingling: No Aggravating factors: bending knee, prolonged sitting Relieving factors: ice/cold compression machine,   History of prior back, hip, or knee injury, pain, surgery, or therapy: Yes; hx of bilateral bunion that is sypmtomatic   Imaging: Yes ;  CLINICAL DATA:  Postop knee replacement. DG 08/15/23   EXAM: PORTABLE RIGHT KNEE - 1-2 VIEW   COMPARISON:  None Available.   FINDINGS: Right knee arthroplasty in expected alignment. No periprosthetic lucency or fracture. There has been patellar resurfacing. Recent postsurgical change includes air and edema in the soft tissues and joint space. Anterior skin staples in place.   IMPRESSION: Right knee arthroplasty without immediate postoperative complication.   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 No  Living Environment Lives with: lives with their spouse; her son stayed with pt first week after surgery. Husband is back to work. Pt is at home alone during work week at this time. Pt has sister who is hemiplegic who previously stayed with her.  Lives in: House/apartment Ramped entry, grab bar installed. Step-in shower. Bench in shower. Pt uses raised commode seat to sit down and get dressed. Concrete driveway to enter home.   Patient Goals: No assistive device, able to sit at her desk for work at her computer.    OBJECTIVE:     Gross Musculoskeletal Assessment Tremor: None Bulk: Atrophy of R>L quadriceps Tone: Normal No sign of acute infection   GAIT: Distance walked: 30 ft Assistive device utilized: Walker - 2 wheeled Level of assistance: SBA Comments: Dec terminal knee extension, dec heel strike, dec step length R, dec stance time R/antalgic pattern    AROM AROM (Normal range in degrees) AROM  Hip Right 08/31/23 Left 08/31/23  Flexion (125)    Extension (15)    Abduction (40)    Adduction     Internal Rotation (45)    External Rotation (45)  Knee    Flexion (135) 101 135  Extension (0) 0 +3      Ankle    Dorsiflexion (20) WNL WNL  Plantarflexion (50) WNL WNL  Inversion (35)    Eversion (15    (* = pain; Blank rows = not tested)  LE MMT: MMT (out of 5) Right 08/31/23 Left 08/31/23  Hip flexion 4- 4  Hip extension    Hip abduction (seated) 5 5  Hip adduction (seated) 5 5  Hip internal rotation    Hip external rotation    Knee flexion 5 5  Knee extension 4- 5  Ankle dorsiflexion    Ankle plantarflexion    Ankle inversion    Ankle eversion    (* = pain; Blank rows = not tested)  *Pt demonstrates SLR with no extensor lag   Sensation Deferred  Reflexes Deferred  Muscle Length Hamstrings: R: Positive L: Negative Quadriceps  ([place]): R: Not examined L: Not examined     TODAY'S TREATMENT: 09/14/23    SUBJECTIVE STATEMENT:   Pt reports no pain along incision. She reports pain mainly in suprapatellar region at midline along incision. Patient reports 4/10 pain at arrival in this region. Patient reports a little bit of soreness after last visit. Patient reports doing okay after using ice machine at home.     Manual Therapy - for symptom modulation, soft tissue sensitivity and mobility, joint mobility, ROM  Patellar mobilization; gr III; 2 x 30 sec with emphasis on superior and inferior  -pt reports pain with R knee lying flat and direct pressure onto anterior knee  R Knee PROM within pt tolerance; x 15 repetitions moving into flexion and extension as tolerated    R knee AROM -3-115    Therapeutic Exercise - for knee ROM, strengthening to improve ability to perform closed-chain functional movements e.g. transfers, squatting, stair negotiation  NuStep level 3 (seat 5) x 7 minutes to improve L knee mobility and cardiorespiratory endurance.   Low load, long duration stretch for knee extension, lower leg resting on pillow, 5-lb cuff weight; x 2 minutes  Supine SAQ with blue bolster under foot; 2x10, 3 second hold  -resume 4# AW next visit  Seated LAQ 4# AW 2 x 10 each LE;  Dynamic march in // bars; 5x D/B  Standing knee flexion stretch on 2nd step x 10 with 5 second hold  Forward step up; 6-inch step, unilateral LUE support; 1x12   PATIENT EDUCATION:  We discussed use of knee prop extension stretch at home and continued HEP.    *next visit* TRX squats; 2 x 10, depth to tolerance    *not today* Forward to retro step on blue agility ladder; 4x D/B for terminal knee extension and gait stability Standing marching 3# AW 2 x 10 each LE   Standing hip abduction 3# AW 2 x 10 each LE  Standing hamstring curls 2#AW 2 x 10 each LE  Seated hamstring stretch 3 x 30 seconds  Cold pack (unbilled) - for  anti-inflammatory and analgesic effect as needed for reduced pain and improved ability to participate in active PT intervention, along R knee in supine with 2 pillows under R calf to elevate lower limb slightly, x 5 minutes   PATIENT EDUCATION:  Education details: see above for patient education details Person educated: Patient Education method: Explanation, Demonstration, and Handouts Education comprehension: verbalized understanding and returned demonstration   HOME EXERCISE PROGRAM:  Access Code: [email or web address] URL: [email or web address] Date: 08/31/2023 Prepared by: Riki Rusk  Vonita Moss  Exercises - Biomedical scientist  - 2 x daily - 7 x weekly - 3 sets - 30sec hold - Active Straight Leg Raise with Quad Set  - 2 x daily - 7 x weekly - 2 sets - 10 reps   ASSESSMENT:  CLINICAL IMPRESSION:   Patient demonstrates mild R knee extension deficit, though deficit is less than 5 degrees; -2 to -3 degrees noted today. Pt is progressing very well with gait without relying on AD and she has functional knee flexion ROM. Pt able to initiate forward step-up without LOB and unilateral handrail support. She is making excellent progress 4 weeks post-op. Pt will continue to benefit from skilled PT services to address deficits and improve function.  OBJECTIVE IMPAIRMENTS: Abnormal gait, decreased balance, decreased mobility, difficulty walking, decreased ROM, decreased strength, increased edema, impaired flexibility, and pain.   ACTIVITY LIMITATIONS: carrying, lifting, bending, sitting, squatting, stairs, transfers, bed mobility, and locomotion level  PARTICIPATION LIMITATIONS: meal prep, cleaning, laundry, driving, shopping, community activity, and occupation  PERSONAL FACTORS: Past/current experiences, complicated orthopedic history, and 3+ comorbidities: (Type 2 DM, diabetic nephropathy, anxiety, anemia, HTN, and aortic atherosclerosis) are also affecting patient's functional outcome.    REHAB POTENTIAL: Good  CLINICAL DECISION MAKING: Evolving/moderate complexity  EVALUATION COMPLEXITY: Moderate   GOALS: Goals reviewed with patient? Yes  SHORT TERM GOALS: Target date: 09/21/2023  Pt will be independent with HEP to improve strength and decrease knee pain to improve pain-free function at home and work. Baseline: 08/31/23: Home health exercises reviewed; HEP updated and new handout given to pt.  Goal status: INITIAL  By 4 weeks pt will perform TUG in less than 14 seconds indicative of improved home mobility and reduced fall risk Baseline: 08/31/23: 15.02 per home health baseline Goal status: INITIAL   LONG TERM GOALS: Target date: 10/26/2023  Pt will increase FOTO to at least 58 to demonstrate significant improvement in function at home and work related to knee pain  Baseline: 08/31/23: 41 Goal status: INITIAL  2.  Pt will decrease worst knee pain no more than 2-3/10 on the NPRS in order to demonstrate clinically significant reduction in knee pain. Baseline: 08/31/23: 10/10 at worst Goal status: INITIAL  3.  Pt will demonstrate safe reciprocal negotiation of flight of stairs without LOB or buckling of post-op knee as needed for accessing second level of home to complete her workout routine (Bowflex on 2nd level) Baseline: 08/31/23: Patient requires cueing and demo for negotiation of 4 steps with FWW.  Goal status: INITIAL  4.  Pt will increase strength of R quadriceps and tested hip musculature to 4+/5 MMT grade or greater in order to demonstrate improvement in strength and function  Baseline: 08/31/23: Quads and hip flexors 4-/5 Goal status: INITIAL  5.  Pt will ambulate 660 feet or greater with no AD and no increase in knee pain without significant gait deviation or decreased stance time as needed for independent community-level ambulation  Baseline: 08/31/23: Pt ambulates at limited household distance with FWW. Goal status: INITIAL   PLAN: PT  FREQUENCY: 1-2x/week  PT DURATION: 8 weeks  PLANNED INTERVENTIONS: Therapeutic exercises, Therapeutic activity, Neuromuscular re-education, Balance training, Gait training, Patient/Family education, Self Care, Joint mobilization, Electrical stimulation, Cryotherapy, Moist heat, Manual therapy, and Re-evaluation.  PLAN FOR NEXT SESSION: Progress knee ROM as tolerated, low volume and low impact CKC activities, quad strengthening. Progress with gait training and AD wean as able.     Consuela Mimes, PT, DPT [phone]  Riki Rusk  Doyce Loose, PT 09/14/2023, 9:55 AM

## 2023-09-19 ENCOUNTER — Encounter: Payer: Self-pay | Admitting: Physical Therapy

## 2023-09-19 ENCOUNTER — Ambulatory Visit: Payer: BC Managed Care – PPO | Admitting: Physical Therapy

## 2023-09-19 DIAGNOSIS — M25561 Pain in right knee: Secondary | ICD-10-CM

## 2023-09-19 DIAGNOSIS — M6281 Muscle weakness (generalized): Secondary | ICD-10-CM

## 2023-09-19 DIAGNOSIS — M25661 Stiffness of right knee, not elsewhere classified: Secondary | ICD-10-CM

## 2023-09-19 DIAGNOSIS — R262 Difficulty in walking, not elsewhere classified: Secondary | ICD-10-CM

## 2023-09-19 NOTE — Therapy (Signed)
OUTPATIENT PHYSICAL THERAPY TREATMENT  Patient Name: Andrea Zhang MRN: [id number] DOB:Aug 08, 1961, 62 y.o., female Today's Date: 09/19/2023  END OF SESSION:  PT End of Session - 09/19/23 0902     Visit Number 6    Number of Visits 17    Date for PT Re-Evaluation 10/26/23    PT Start Time 0900    PT Stop Time 0952    PT Time Calculation (min) 52 min    Activity Tolerance Patient tolerated treatment well    Behavior During Therapy WFL for tasks assessed/performed               Past Medical History:  Diagnosis Date   Anemia    iron deficiency and b12 deficiency   Anxiety    Aortic atherosclerosis (HCC)    Arthritis    Asthma    Diabetes mellitus without complication (HCC)    Fasciitis    left foot   GERD (gastroesophageal reflux disease)    History of kidney stones    Hypercholesterolemia    Hypertension    Migraines    MIGRAINES HAVE IMPROVED SINCE RECEIVING IRON   Pneumonia    Primary osteoarthritis of right knee    Tachycardia    Past Surgical History:  Procedure Laterality Date   CHOLECYSTECTOMY  2004   COLONOSCOPY WITH ESOPHAGOGASTRODUODENOSCOPY (EGD)  02/2018   ESOPHAGOGASTRODUODENOSCOPY N/A 10/16/2020   Procedure: ESOPHAGOGASTRODUODENOSCOPY (EGD);  Surgeon: Regis Bill, MD;  Location: [facility] ENDOSCOPY;  Service: Endoscopy;  Laterality: N/A;   ESOPHAGOGASTRODUODENOSCOPY (EGD) WITH PROPOFOL N/A 06/06/2016   Procedure: ESOPHAGOGASTRODUODENOSCOPY (EGD) WITH PROPOFOL;  Surgeon: Scot Jun, MD;  Location: [facility] ENDOSCOPY;  Service: Endoscopy;  Laterality: N/A;   EXTRACORPOREAL SHOCK WAVE LITHOTRIPSY  2010   JOINT REPLACEMENT  2013   LT TKR   SAVORY DILATION N/A 06/06/2016   Procedure: SAVORY DILATION;  Surgeon: Scot Jun, MD;  Location: [facility] ENDOSCOPY;  Service: Endoscopy;  Laterality: N/A;   SAVORY DILATION  02/2018   SHOULDER ARTHROSCOPY WITH OPEN ROTATOR CUFF REPAIR Right 05/24/2018   Procedure: right shoulder arthroscopy, extensive  arthroscopic debridement, decompression, open rotator cuff repair, biceps tenodesis;  Surgeon: Christena Flake, MD;  Location: [facility] ORS;  Service: Orthopedics;  Laterality: Right;   TONSILLECTOMY  1978   TOTAL KNEE ARTHROPLASTY Right 08/15/2023   Procedure: TOTAL KNEE ARTHROPLASTY;  Surgeon: Christena Flake, MD;  Location: [facility] ORS;  Service: Orthopedics;  Laterality: Right;   Patient Active Problem List   Diagnosis Date Noted   Status post total knee replacement using cement, right 08/15/2023   Coronary artery calcification 09/09/2019   Aortic atherosclerosis (HCC) 09/09/2019   Allergic rhinitis 08/06/2019   Asthma 08/06/2019   Diabetes mellitus, type 2 (HCC) 08/06/2019   Dysphagia 08/06/2019   GERD (gastroesophageal reflux disease) 08/06/2019   Hypercalcemia 08/06/2019   Hypertension 08/06/2019   Migraines 08/06/2019   Osteoarthritis 08/06/2019   Folate deficiency 03/03/2019   Morbid obesity with BMI of 40.0-44.9, adult (HCC) 06/05/2018   Status post right rotator cuff repair 06/05/2018   Degenerative joint disease of right shoulder 05/24/2018   Degenerative tear of glenoid labrum, right 05/24/2018   Injury of tendon of long head of right biceps 05/24/2018   Nontraumatic complete tear of right rotator cuff 05/14/2018   Rotator cuff tendinitis, right 05/14/2018   Iron deficiency anemia 01/15/2018   B12 deficiency 09/07/2017   Microalbuminuric diabetic nephropathy (HCC) 09/07/2017   Primary osteoarthritis of right knee 02/19/2015   Restless leg syndrome 10/25/2012  Hyperlipidemia with target LDL less than 100 05/20/2012    PCP: Dalia Heading, MD  REFERRING PROVIDER: Dedra Skeens, PA-C  REFERRING DIAG: [phone] (ICD-10-CM) - Presence of right artificial knee joint   RATIONALE FOR EVALUATION AND TREATMENT: Rehabilitation  THERAPY DIAG: Acute pain of right knee  Stiffness of right knee, not elsewhere classified  Difficulty in walking, not elsewhere classified  Muscle  weakness (generalized)  ONSET DATE: R TKA 08/15/23  FOLLOW-UP APPT SCHEDULED WITH REFERRING PROVIDER: Not yet scheduled, staples removed yesterday   PERTINENT HISTORY: Pt is a 62 year old female s/p R TKA 08/15/23. Hx of L TKA 11 years ago. Patient reports notable pain with staples in incision and attempting to bend knee - they were removed yesterday. Pt reports wearing shoes for first time Tuesday night and had notable pain from bunion in L foot. She is wearing thicker sock on L foot now and this seems to help. Patient reports using wide size shoes to accommodate her bunion. Patient has completed home health therapy. Per home health paperwork, SPPB 10/12, TUG 15.02 sec.   PAIN:   Pain Intensity: Present: 3/10, Best: 0/10, Worst: 10/10 Pain location: Posterior knee/popliteal region Pain quality: aching  Radiating pain: Yes , feels bruised into shin  Swelling: Yes   Numbness/Tingling: No Aggravating factors: bending knee, prolonged sitting Relieving factors: ice/cold compression machine,   History of prior back, hip, or knee injury, pain, surgery, or therapy: Yes; hx of bilateral bunion that is sypmtomatic   Imaging: Yes ;  CLINICAL DATA:  Postop knee replacement. DG 08/15/23   EXAM: PORTABLE RIGHT KNEE - 1-2 VIEW   COMPARISON:  None Available.   FINDINGS: Right knee arthroplasty in expected alignment. No periprosthetic lucency or fracture. There has been patellar resurfacing. Recent postsurgical change includes air and edema in the soft tissues and joint space. Anterior skin staples in place.   IMPRESSION: Right knee arthroplasty without immediate postoperative complication.   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 No  Living Environment Lives with: lives with their spouse; her son stayed with pt first week after surgery. Husband is back to work. Pt is at home alone during work week at this time. Pt has sister who is hemiplegic who previously stayed with her.  Lives in: House/apartment Ramped entry, grab bar installed. Step-in shower. Bench in shower. Pt uses raised commode seat to sit down and get dressed. Concrete driveway to enter home.   Patient Goals: No assistive device, able to sit at her desk for work at her computer.    OBJECTIVE:     Gross Musculoskeletal Assessment Tremor: None Bulk: Atrophy of R>L quadriceps Tone: Normal No sign of acute infection   GAIT: Distance walked: 30 ft Assistive device utilized: Walker - 2 wheeled Level of assistance: SBA Comments: Dec terminal knee extension, dec heel strike, dec step length R, dec stance time R/antalgic pattern    AROM AROM (Normal range in degrees) AROM  Hip Right 08/31/23 Left 08/31/23  Flexion (125)    Extension (15)    Abduction (40)    Adduction     Internal Rotation (45)    External Rotation (45)  Knee    Flexion (135) 101 135  Extension (0) 0 +3      Ankle    Dorsiflexion (20) WNL WNL  Plantarflexion (50) WNL WNL  Inversion (35)    Eversion (15    (* = pain; Blank rows = not tested)  LE MMT: MMT (out of 5) Right 08/31/23 Left 08/31/23  Hip flexion 4- 4  Hip extension    Hip abduction (seated) 5 5  Hip adduction (seated) 5 5  Hip internal rotation    Hip external rotation    Knee flexion 5 5  Knee extension 4- 5  Ankle dorsiflexion    Ankle plantarflexion    Ankle inversion    Ankle eversion    (* = pain; Blank rows = not tested)  *Pt demonstrates SLR with no extensor lag   Sensation Deferred  Reflexes Deferred  Muscle Length Hamstrings: R: Positive L: Negative Quadriceps  ([place]): R: Not examined L: Not examined     TODAY'S TREATMENT: 09/19/23    SUBJECTIVE STATEMENT:   Pt reports more pain this AM that she noticed more upon waking. She reports 4/10 NPRS at arrival. Pain along superior portion of incision, some increased erythema along this region. Pt is compliant with HEP.     Manual Therapy - for symptom modulation, soft tissue sensitivity and mobility, joint mobility, ROM  Patellar mobilization; gr III; 2 x 30 sec with emphasis on superior and inferior  -pt reports pain with R knee lying flat and direct pressure onto anterior knee  R Knee PROM within pt tolerance; x 15 repetitions moving into flexion and extension as tolerated  STM vastus medialis and vastus lateralis, rectus femoris proximal to incision; x 5 minutes  R knee AROM 0-112    Therapeutic Exercise - for knee ROM, strengthening to improve ability to perform closed-chain functional movements e.g. transfers, squatting, stair negotiation  NuStep level 3 (seat 5) x 7 minutes to improve L knee mobility and cardiorespiratory endurance.    Supine SAQ with blue bolster under foot; 2x10, 3 second hold, 4# AW SLR; 2x10, no weight today  Seated LAQ 4# AW 2 x 10 each LE;  Dynamic march in // bars; 5x D/B  TRX air squats, chair behind patient, butt tap (pt not sitting down); 2 x 10, depth to tolerance    PATIENT EDUCATION:  We discussed signs of infection for which to monitor and following up with PT/MD/PA if noticing worsening signs.     Cold pack (unbilled) - for anti-inflammatory and analgesic effect as needed for reduced pain and improved ability to participate in active PT intervention, along R knee in supine with 2 pillows under R calf to elevate lower limb slightly, x 5 minutes   *next visit* Forward step up; 6-inch step, unilateral LUE support; 1x12   *not today* Standing knee flexion stretch on 2nd step x 10 with 5 second hold Low load, long duration stretch for knee  extension, lower leg resting on pillow, 5-lb cuff weight; x 2 minutes Forward to retro step on blue agility ladder; 4x D/B for terminal knee extension and gait stability Standing marching 3# AW 2 x 10 each LE   Standing hip abduction 3# AW 2 x 10 each LE  Standing hamstring curls 2#AW 2 x 10 each LE  Seated hamstring stretch 3 x 30 seconds     PATIENT EDUCATION:  Education details: see above for patient education details Person educated: Patient Education method: Explanation, Demonstration, and Handouts Education comprehension:  verbalized understanding and returned demonstration   HOME EXERCISE PROGRAM:  Access Code: [email or web address] URL: [email or web address] Date: 08/31/2023 Prepared by: Consuela Mimes  Exercises - Seated Hamstring Stretch  - 2 x daily - 7 x weekly - 3 sets - 30sec hold - Active Straight Leg Raise with Quad Set  - 2 x daily - 7 x weekly - 2 sets - 10 reps   ASSESSMENT:  CLINICAL IMPRESSION:   Patient is making good progress with ROM and quad control. She has some discomfort and erythema along superior portion of incision. No drainage/fever/increased temperature. We discussed signs of infection for which to monitor. Pt tolerates exercises generally well today, though she has some discomfort along distal quad with SAQ. Pt will continue to benefit from skilled PT services to address deficits and improve function.  OBJECTIVE IMPAIRMENTS: Abnormal gait, decreased balance, decreased mobility, difficulty walking, decreased ROM, decreased strength, increased edema, impaired flexibility, and pain.   ACTIVITY LIMITATIONS: carrying, lifting, bending, sitting, squatting, stairs, transfers, bed mobility, and locomotion level  PARTICIPATION LIMITATIONS: meal prep, cleaning, laundry, driving, shopping, community activity, and occupation  PERSONAL FACTORS: Past/current experiences, complicated orthopedic history, and 3+ comorbidities: (Type 2 DM, diabetic nephropathy,  anxiety, anemia, HTN, and aortic atherosclerosis) are also affecting patient's functional outcome.   REHAB POTENTIAL: Good  CLINICAL DECISION MAKING: Evolving/moderate complexity  EVALUATION COMPLEXITY: Moderate   GOALS: Goals reviewed with patient? Yes  SHORT TERM GOALS: Target date: 09/21/2023  Pt will be independent with HEP to improve strength and decrease knee pain to improve pain-free function at home and work. Baseline: 08/31/23: Home health exercises reviewed; HEP updated and new handout given to pt.  Goal status: INITIAL  By 4 weeks pt will perform TUG in less than 14 seconds indicative of improved home mobility and reduced fall risk Baseline: 08/31/23: 15.02 per home health baseline Goal status: INITIAL   LONG TERM GOALS: Target date: 10/26/2023  Pt will increase FOTO to at least 58 to demonstrate significant improvement in function at home and work related to knee pain  Baseline: 08/31/23: 41 Goal status: INITIAL  2.  Pt will decrease worst knee pain no more than 2-3/10 on the NPRS in order to demonstrate clinically significant reduction in knee pain. Baseline: 08/31/23: 10/10 at worst Goal status: INITIAL  3.  Pt will demonstrate safe reciprocal negotiation of flight of stairs without LOB or buckling of post-op knee as needed for accessing second level of home to complete her workout routine (Bowflex on 2nd level) Baseline: 08/31/23: Patient requires cueing and demo for negotiation of 4 steps with FWW.  Goal status: INITIAL  4.  Pt will increase strength of R quadriceps and tested hip musculature to 4+/5 MMT grade or greater in order to demonstrate improvement in strength and function  Baseline: 08/31/23: Quads and hip flexors 4-/5 Goal status: INITIAL  5.  Pt will ambulate 660 feet or greater with no AD and no increase in knee pain without significant gait deviation or decreased stance time as needed for independent community-level ambulation  Baseline:  08/31/23: Pt ambulates at limited household distance with FWW. Goal status: INITIAL   PLAN: PT FREQUENCY: 1-2x/week  PT DURATION: 8 weeks  PLANNED INTERVENTIONS: Therapeutic exercises, Therapeutic activity, Neuromuscular re-education, Balance training, Gait training, Patient/Family education, Self Care, Joint mobilization, Electrical stimulation, Cryotherapy, Moist heat, Manual therapy, and Re-evaluation.  PLAN FOR NEXT SESSION: Progress knee ROM as tolerated, low volume and low impact CKC activities, quad strengthening. Progress with gait training and AD wean as  able.     Consuela Mimes, PT, DPT #[id number]  Gertie Exon, PT 09/19/2023, 10:22 AM

## 2023-09-21 ENCOUNTER — Ambulatory Visit: Payer: BC Managed Care – PPO

## 2023-09-21 DIAGNOSIS — M25561 Pain in right knee: Secondary | ICD-10-CM

## 2023-09-21 DIAGNOSIS — M6281 Muscle weakness (generalized): Secondary | ICD-10-CM

## 2023-09-21 DIAGNOSIS — R262 Difficulty in walking, not elsewhere classified: Secondary | ICD-10-CM

## 2023-09-21 DIAGNOSIS — M25661 Stiffness of right knee, not elsewhere classified: Secondary | ICD-10-CM

## 2023-09-21 DIAGNOSIS — G8929 Other chronic pain: Secondary | ICD-10-CM

## 2023-09-21 NOTE — Therapy (Signed)
OUTPATIENT PHYSICAL THERAPY TREATMENT  Patient Name: Andrea Zhang MRN: [id number] DOB:07-31-1961, 62 y.o., female Today's Date: 09/21/2023  END OF SESSION:  PT End of Session - 09/21/23 0912     Visit Number 7    Number of Visits 17    Date for PT Re-Evaluation 10/26/23    Authorization Type [employer]    PT Start Time 0900    PT Stop Time 0940    PT Time Calculation (min) 40 min    Activity Tolerance Patient tolerated treatment well;No increased pain               Past Medical History:  Diagnosis Date   Anemia    iron deficiency and b12 deficiency   Anxiety    Aortic atherosclerosis (HCC)    Arthritis    Asthma    Diabetes mellitus without complication (HCC)    Fasciitis    left foot   GERD (gastroesophageal reflux disease)    History of kidney stones    Hypercholesterolemia    Hypertension    Migraines    MIGRAINES HAVE IMPROVED SINCE RECEIVING IRON   Pneumonia    Primary osteoarthritis of right knee    Tachycardia    Past Surgical History:  Procedure Laterality Date   CHOLECYSTECTOMY  2004   COLONOSCOPY WITH ESOPHAGOGASTRODUODENOSCOPY (EGD)  02/2018   ESOPHAGOGASTRODUODENOSCOPY N/A 10/16/2020   Procedure: ESOPHAGOGASTRODUODENOSCOPY (EGD);  Surgeon: Regis Bill, MD;  Location: [facility] ENDOSCOPY;  Service: Endoscopy;  Laterality: N/A;   ESOPHAGOGASTRODUODENOSCOPY (EGD) WITH PROPOFOL N/A 06/06/2016   Procedure: ESOPHAGOGASTRODUODENOSCOPY (EGD) WITH PROPOFOL;  Surgeon: Scot Jun, MD;  Location: [facility] ENDOSCOPY;  Service: Endoscopy;  Laterality: N/A;   EXTRACORPOREAL SHOCK WAVE LITHOTRIPSY  2010   JOINT REPLACEMENT  2013   LT TKR   SAVORY DILATION N/A 06/06/2016   Procedure: SAVORY DILATION;  Surgeon: Scot Jun, MD;  Location: [facility] ENDOSCOPY;  Service: Endoscopy;  Laterality: N/A;   SAVORY DILATION  02/2018   SHOULDER ARTHROSCOPY WITH OPEN ROTATOR CUFF REPAIR Right 05/24/2018   Procedure: right shoulder arthroscopy, extensive  arthroscopic debridement, decompression, open rotator cuff repair, biceps tenodesis;  Surgeon: Christena Flake, MD;  Location: [facility] ORS;  Service: Orthopedics;  Laterality: Right;   TONSILLECTOMY  1978   TOTAL KNEE ARTHROPLASTY Right 08/15/2023   Procedure: TOTAL KNEE ARTHROPLASTY;  Surgeon: Christena Flake, MD;  Location: [facility] ORS;  Service: Orthopedics;  Laterality: Right;   Patient Active Problem List   Diagnosis Date Noted   Status post total knee replacement using cement, right 08/15/2023   Coronary artery calcification 09/09/2019   Aortic atherosclerosis (HCC) 09/09/2019   Allergic rhinitis 08/06/2019   Asthma 08/06/2019   Diabetes mellitus, type 2 (HCC) 08/06/2019   Dysphagia 08/06/2019   GERD (gastroesophageal reflux disease) 08/06/2019   Hypercalcemia 08/06/2019   Hypertension 08/06/2019   Migraines 08/06/2019   Osteoarthritis 08/06/2019   Folate deficiency 03/03/2019   Morbid obesity with BMI of 40.0-44.9, adult (HCC) 06/05/2018   Status post right rotator cuff repair 06/05/2018   Degenerative joint disease of right shoulder 05/24/2018   Degenerative tear of glenoid labrum, right 05/24/2018   Injury of tendon of long head of right biceps 05/24/2018   Nontraumatic complete tear of right rotator cuff 05/14/2018   Rotator cuff tendinitis, right 05/14/2018   Iron deficiency anemia 01/15/2018   B12 deficiency 09/07/2017   Microalbuminuric diabetic nephropathy (HCC) 09/07/2017   Primary osteoarthritis of right knee 02/19/2015   Restless leg syndrome 10/25/2012  Hyperlipidemia with target LDL less than 100 05/20/2012    PCP: Dalia Heading, MD  REFERRING PROVIDER: Dedra Skeens, PA-C  REFERRING DIAG: [phone] (ICD-10-CM) - Presence of right artificial knee joint   RATIONALE FOR EVALUATION AND TREATMENT: Rehabilitation  THERAPY DIAG: Acute pain of right knee  Stiffness of right knee, not elsewhere classified  Difficulty in walking, not elsewhere classified  Muscle  weakness (generalized)  Chronic pain of right knee  ONSET DATE: R TKA 08/15/23  FOLLOW-UP APPT SCHEDULED WITH REFERRING PROVIDER: Not yet scheduled, staples removed yesterday  SUBJECTIVE STATEMENT:   Pain fairly bad last 2 days but today, much improved. Pt still concerned about redness around her incision, but she has sent her PA a photo of it and she sees ortho tomorrow in office.    PERTINENT HISTORY: Pt is a 62 year old female s/p R TKA 08/15/23. Hx of L TKA 11 years ago. Patient reports notable pain with staples in incision and attempting to bend knee - they were removed yesterday. Pt reports wearing shoes for first time Tuesday night and had notable pain from bunion in L foot. She is wearing thicker sock on L foot now and this seems to help. Patient reports using wide size shoes to accommodate her bunion. Patient has completed home health therapy. Per home health paperwork, SPPB 10/12, TUG 15.02 sec.   PAIN:  Yes 1/10 incision area   IMAGING CLINICAL DATA:  Postop knee replacement. DG 08/15/23   EXAM: PORTABLE RIGHT KNEE - 1-2 VIEW   COMPARISON:  None Available.   FINDINGS: Right knee arthroplasty in expected alignment. No periprosthetic lucency or fracture. There has been patellar resurfacing. Recent postsurgical change includes air and edema in the soft tissues and joint space. Anterior skin staples in place.   IMPRESSION: Right knee arthroplasty without immediate postoperative complication.   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 No  Living Environment Lives with: lives with their spouse; her son stayed with pt  first week after surgery. Husband is back to work. Pt is at home alone during work week at this time. Pt has sister who is hemiplegic who previously stayed with her.  Lives in: House/apartment Ramped entry, grab bar installed. Step-in shower. Bench in shower. Pt uses raised commode seat to sit down and get dressed. Concrete driveway to enter home.   Patient Goals: No assistive device, able to sit at her desk for work at her computer.    OBJECTIVE:    TODAY'S TREATMENT: 09/21/23  -Visual inspection of incision, discussion of FU with orthopedics -AA/ROM on nustep x5 minutes, level 2, seat 6, arms 6  -Hooklying Rt knee flexion stretch 10x10sec HARD -Rt knee flexion ROM: 122 degrees (extension >15)  -RLE heel slides x15 -RLE marching in hooklying x15 -Quad sets 12x3secH  -hooklying bridge x10  -Hooklying SAQ RLE on blue bolster 1x10 @ 4lb AW  -Hooklying SAQ RLE on blue bolster 1x10 @ 4lb AW   -sit to stand hands-free, elevated height x8  -lateral side step 5lb AW 20x bilat, alternating  -heel raise x20 (BUE support)  -standing knee flexion heel raise x10 5lb AW (toe cramping, ceases and desisted)      PATIENT EDUCATION:  Education details: see above for patient education details Person educated: Patient Education method: Explanation, Demonstration, and Handouts Education comprehension: verbalized understanding and returned demonstration   HOME EXERCISE PROGRAM:  Access Code: [email or web address] URL: [email or web address] Date: 08/31/2023 Prepared by: Consuela Mimes  Exercises - Seated Hamstring Stretch  - 2 x daily - 7 x weekly - 3 sets - 30sec hold - Active Straight Leg Raise with Quad Set  - 2 x daily - 7 x weekly - 2 sets - 10 reps   ASSESSMENT:  CLINICAL IMPRESSION:   Knee fleixon now beyond 120, however extension remains limited. Activities still not back to baseline. Tolerance to open chain resistance much better than closed chain. Pt will continue to benefit  from skilled PT services to address deficits and improve function.  OBJECTIVE IMPAIRMENTS: Abnormal gait, decreased balance, decreased mobility, difficulty walking, decreased ROM, decreased strength, increased edema, impaired flexibility, and pain.   ACTIVITY LIMITATIONS: carrying, lifting, bending, sitting, squatting, stairs, transfers, bed mobility, and locomotion level  PARTICIPATION LIMITATIONS: meal prep, cleaning, laundry, driving, shopping, community activity, and occupation  PERSONAL FACTORS: Past/current experiences, complicated orthopedic history, and 3+ comorbidities: (Type 2 DM, diabetic nephropathy, anxiety, anemia, HTN, and aortic atherosclerosis) are also affecting patient's functional outcome.   REHAB POTENTIAL: Good  CLINICAL DECISION MAKING: Evolving/moderate complexity  EVALUATION COMPLEXITY: Moderate   GOALS: Goals reviewed with patient? Yes  SHORT TERM GOALS: Target date: 09/21/2023  Pt will be independent with HEP to improve strength and decrease knee pain to improve pain-free function at home and work. Baseline: 08/31/23: Home health exercises reviewed; HEP updated and new handout given to pt.  Goal status: INITIAL  By 4 weeks pt will perform TUG in less than 14 seconds indicative of improved home mobility and reduced fall risk Baseline: 08/31/23: 15.02 per home health baseline Goal status: INITIAL   LONG TERM GOALS: Target date: 10/26/2023  Pt will increase FOTO to at least 58 to demonstrate significant improvement in function at home and work related to knee pain  Baseline: 08/31/23: 41 Goal status: INITIAL  2.  Pt will decrease worst knee pain no more than 2-3/10 on the NPRS in order to demonstrate clinically significant reduction in knee pain. Baseline: 08/31/23: 10/10 at worst Goal status: INITIAL  3.  Pt will demonstrate safe reciprocal negotiation of flight of stairs without LOB or buckling of post-op knee as needed for accessing second level of  home to complete her workout routine (Bowflex on 2nd level) Baseline: 08/31/23: Patient requires cueing and demo for negotiation of 4 steps with FWW.  Goal status: INITIAL  4.  Pt will increase strength of R quadriceps and tested hip musculature to 4+/5 MMT grade or greater in order to demonstrate improvement in strength and function  Baseline: 08/31/23: Quads and hip flexors 4-/5 Goal status: INITIAL  5.  Pt will ambulate 660 feet or greater with no AD and no increase in knee pain without significant gait deviation or decreased stance time as needed for independent community-level ambulation  Baseline: 08/31/23: Pt ambulates at limited household distance with FWW. Goal status: INITIAL   PLAN: PT FREQUENCY: 1-2x/week  PT DURATION: 8 weeks  PLANNED INTERVENTIONS: Therapeutic exercises, Therapeutic activity, Neuromuscular re-education, Balance training, Gait training, Patient/Family education, Self Care, Joint mobilization, Electrical stimulation, Cryotherapy, Moist heat, Manual therapy, and Re-evaluation.  PLAN FOR NEXT SESSION: Progress knee ROM as tolerated, low volume and low impact CKC activities, quad strengthening. Progress with gait training and AD wean as able.  9:18 AM, 09/21/23 Rosamaria Lints, PT, DPT Physical Therapist - [facility] in [place]  [phone] (Office)     Clarence C, PT 09/21/2023, 9:14 AM

## 2023-09-26 ENCOUNTER — Encounter: Payer: Self-pay | Admitting: Physical Therapy

## 2023-09-26 ENCOUNTER — Ambulatory Visit: Payer: BC Managed Care – PPO | Admitting: Physical Therapy

## 2023-09-26 DIAGNOSIS — M25561 Pain in right knee: Secondary | ICD-10-CM

## 2023-09-26 DIAGNOSIS — R262 Difficulty in walking, not elsewhere classified: Secondary | ICD-10-CM

## 2023-09-26 DIAGNOSIS — M25661 Stiffness of right knee, not elsewhere classified: Secondary | ICD-10-CM

## 2023-09-26 DIAGNOSIS — M6281 Muscle weakness (generalized): Secondary | ICD-10-CM

## 2023-09-26 NOTE — Therapy (Unsigned)
OUTPATIENT PHYSICAL THERAPY TREATMENT  Patient Name: Andrea Zhang MRN: [id number] DOB:21-Feb-1961, 62 y.o., female Today's Date: 09/26/2023  END OF SESSION:  PT End of Session - 09/26/23 0905     Visit Number 8    Number of Visits 17    Date for PT Re-Evaluation 10/26/23    Authorization Type [employer]    PT Start Time 0900    PT Stop Time 0940    PT Time Calculation (min) 40 min    Activity Tolerance Patient tolerated treatment well;No increased pain                Past Medical History:  Diagnosis Date   Anemia    iron deficiency and b12 deficiency   Anxiety    Aortic atherosclerosis (HCC)    Arthritis    Asthma    Diabetes mellitus without complication (HCC)    Fasciitis    left foot   GERD (gastroesophageal reflux disease)    History of kidney stones    Hypercholesterolemia    Hypertension    Migraines    MIGRAINES HAVE IMPROVED SINCE RECEIVING IRON   Pneumonia    Primary osteoarthritis of right knee    Tachycardia    Past Surgical History:  Procedure Laterality Date   CHOLECYSTECTOMY  2004   COLONOSCOPY WITH ESOPHAGOGASTRODUODENOSCOPY (EGD)  02/2018   ESOPHAGOGASTRODUODENOSCOPY N/A 10/16/2020   Procedure: ESOPHAGOGASTRODUODENOSCOPY (EGD);  Surgeon: Regis Bill, MD;  Location: [facility] ENDOSCOPY;  Service: Endoscopy;  Laterality: N/A;   ESOPHAGOGASTRODUODENOSCOPY (EGD) WITH PROPOFOL N/A 06/06/2016   Procedure: ESOPHAGOGASTRODUODENOSCOPY (EGD) WITH PROPOFOL;  Surgeon: Scot Jun, MD;  Location: [facility] ENDOSCOPY;  Service: Endoscopy;  Laterality: N/A;   EXTRACORPOREAL SHOCK WAVE LITHOTRIPSY  2010   JOINT REPLACEMENT  2013   LT TKR   SAVORY DILATION N/A 06/06/2016   Procedure: SAVORY DILATION;  Surgeon: Scot Jun, MD;  Location: [facility] ENDOSCOPY;  Service: Endoscopy;  Laterality: N/A;   SAVORY DILATION  02/2018   SHOULDER ARTHROSCOPY WITH OPEN ROTATOR CUFF REPAIR Right 05/24/2018   Procedure: right shoulder arthroscopy, extensive  arthroscopic debridement, decompression, open rotator cuff repair, biceps tenodesis;  Surgeon: Christena Flake, MD;  Location: [facility] ORS;  Service: Orthopedics;  Laterality: Right;   TONSILLECTOMY  1978   TOTAL KNEE ARTHROPLASTY Right 08/15/2023   Procedure: TOTAL KNEE ARTHROPLASTY;  Surgeon: Christena Flake, MD;  Location: [facility] ORS;  Service: Orthopedics;  Laterality: Right;   Patient Active Problem List   Diagnosis Date Noted   Status post total knee replacement using cement, right 08/15/2023   Coronary artery calcification 09/09/2019   Aortic atherosclerosis (HCC) 09/09/2019   Allergic rhinitis 08/06/2019   Asthma 08/06/2019   Diabetes mellitus, type 2 (HCC) 08/06/2019   Dysphagia 08/06/2019   GERD (gastroesophageal reflux disease) 08/06/2019   Hypercalcemia 08/06/2019   Hypertension 08/06/2019   Migraines 08/06/2019   Osteoarthritis 08/06/2019   Folate deficiency 03/03/2019   Morbid obesity with BMI of 40.0-44.9, adult (HCC) 06/05/2018   Status post right rotator cuff repair 06/05/2018   Degenerative joint disease of right shoulder 05/24/2018   Degenerative tear of glenoid labrum, right 05/24/2018   Injury of tendon of long head of right biceps 05/24/2018   Nontraumatic complete tear of right rotator cuff 05/14/2018   Rotator cuff tendinitis, right 05/14/2018   Iron deficiency anemia 01/15/2018   B12 deficiency 09/07/2017   Microalbuminuric diabetic nephropathy (HCC) 09/07/2017   Primary osteoarthritis of right knee 02/19/2015   Restless leg syndrome 10/25/2012  Hyperlipidemia with target LDL less than 100 05/20/2012    PCP: Dalia Heading, MD  REFERRING PROVIDER: Dedra Skeens, PA-C  REFERRING DIAG: [phone] (ICD-10-CM) - Presence of right artificial knee joint   RATIONALE FOR EVALUATION AND TREATMENT: Rehabilitation  THERAPY DIAG: Acute pain of right knee  Stiffness of right knee, not elsewhere classified  Difficulty in walking, not elsewhere classified  Muscle  weakness (generalized)  ONSET DATE: R TKA 08/15/23  FOLLOW-UP APPT SCHEDULED WITH REFERRING PROVIDER: Not yet scheduled, staples removed yesterday   PERTINENT HISTORY: Pt is a 62 year old female s/p R TKA 08/15/23. Hx of L TKA 11 years ago. Patient reports notable pain with staples in incision and attempting to bend knee - they were removed yesterday. Pt reports wearing shoes for first time Tuesday night and had notable pain from bunion in L foot. She is wearing thicker sock on L foot now and this seems to help. Patient reports using wide size shoes to accommodate her bunion. Patient has completed home health therapy. Per home health paperwork, SPPB 10/12, TUG 15.02 sec.   PAIN:   Pain Intensity: Present: 3/10, Best: 0/10, Worst: 10/10 Pain location: Posterior knee/popliteal region Pain quality: aching  Radiating pain: Yes , feels bruised into shin  Swelling: Yes   Numbness/Tingling: No Aggravating factors: bending knee, prolonged sitting Relieving factors: ice/cold compression machine,   History of prior back, hip, or knee injury, pain, surgery, or therapy: Yes; hx of bilateral bunion that is sypmtomatic   Imaging: Yes ;  CLINICAL DATA:  Postop knee replacement. DG 08/15/23   EXAM: PORTABLE RIGHT KNEE - 1-2 VIEW   COMPARISON:  None Available.   FINDINGS: Right knee arthroplasty in expected alignment. No periprosthetic lucency or fracture. There has been patellar resurfacing. Recent postsurgical change includes air and edema in the soft tissues and joint space. Anterior skin staples in place.   IMPRESSION: Right knee arthroplasty without immediate postoperative complication.   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 No  Living Environment Lives with: lives with their spouse; her son stayed with pt first week after surgery. Husband is back to work. Pt is at home alone during work week at this time. Pt has sister who is hemiplegic who previously stayed with her.  Lives in: House/apartment Ramped entry, grab bar installed. Step-in shower. Bench in shower. Pt uses raised commode seat to sit down and get dressed. Concrete driveway to enter home.   Patient Goals: No assistive device, able to sit at her desk for work at her computer.    OBJECTIVE:     Gross Musculoskeletal Assessment Tremor: None Bulk: Atrophy of R>L quadriceps Tone: Normal No sign of acute infection   GAIT: Distance walked: 30 ft Assistive device utilized: Walker - 2 wheeled Level of assistance: SBA Comments: Dec terminal knee extension, dec heel strike, dec step length R, dec stance time R/antalgic pattern    AROM AROM (Normal range in degrees) AROM  Hip Right 08/31/23 Left 08/31/23  Flexion (125)    Extension (15)    Abduction (40)    Adduction     Internal Rotation (45)    External Rotation (45)  Knee    Flexion (135) 101 135  Extension (0) 0 +3      Ankle    Dorsiflexion (20) WNL WNL  Plantarflexion (50) WNL WNL  Inversion (35)    Eversion (15    (* = pain; Blank rows = not tested)  LE MMT: MMT (out of 5) Right 08/31/23 Left 08/31/23  Hip flexion 4- 4  Hip extension    Hip abduction (seated) 5 5  Hip adduction (seated) 5 5  Hip internal rotation    Hip external rotation    Knee flexion 5 5  Knee extension 4- 5  Ankle dorsiflexion    Ankle plantarflexion    Ankle inversion    Ankle eversion    (* = pain; Blank rows = not tested)  *Pt demonstrates SLR with no extensor lag   Sensation Deferred  Reflexes Deferred  Muscle Length Hamstrings: R: Positive L: Negative Quadriceps  ([place]): R: Not examined L: Not examined     TODAY'S TREATMENT: 09/26/23    SUBJECTIVE STATEMENT:   Pt followed up with referring office this past Friday regarding redness and swelling along post-op incision and was started on Bactrim. Pt reports feeling much better between Saturday to Sunday. She reports drainage along mid-length of incision with significant erythema. Patient reports 2.5-3/10 pain at arrival to PT.    *Significant erythema and edema along mid-length of incision, serous drainage along mid-length of incision with purulent discharge with light pressure adjacent to incision. Increased tissue temperature adjacent to incision along mid-length.      Manual Therapy - for symptom modulation, soft tissue sensitivity and mobility, joint mobility, ROM   R Knee PROM within pt tolerance; x 15 repetitions moving into flexion and extension as tolerated  *Grossly WNL PROM noted today     Therapeutic Exercise - for knee ROM, strengthening to improve ability to perform closed-chain functional movements e.g. transfers, squatting, stair negotiation  NuStep level 5 min (seat 5) x 5 minutes to improve L knee mobility and cardiorespiratory endurance.    Supine SAQ with blue bolster under foot; 2x10, 3 second hold, 4# AW SLR; 3x10, no weight today; Seated LAQ 4# AW 2 x 10 each LE; Bridge, 1 sec hold at top; 2 x 10  Dynamic march along blue agility; 5x D/B  Sit to stand from standard-height chair; 2x8    PATIENT EDUCATION:  Discussed monitoring for body temp or worsening of mid-length of incision at home; advised finishing antibiotic as prescribed by MD.     Cold pack (unbilled) - for anti-inflammatory and analgesic effect as needed for reduced pain and improved ability to participate in active PT intervention, along R knee in supine with 2 pillows under R calf to elevate lower limb slightly, x 5 minutes   *next visit* Forward step up; 6-inch step, unilateral LUE support;  1x12   *not today* Standing knee flexion stretch on 2nd step x 10 with 5 second hold Low load, long duration stretch for knee extension, lower leg resting on pillow, 5-lb cuff weight; x 2 minutes Forward to retro step on blue agility ladder; 4x D/B for terminal knee extension and gait stability Standing marching 3# AW 2 x 10 each LE   Standing hip abduction 3# AW 2 x 10 each LE  Standing hamstring curls 2#AW 2 x 10 each LE  Seated hamstring stretch 3 x 30 seconds     PATIENT EDUCATION:  Education details: see above for patient education details Person educated: Patient Education method:  Explanation, Demonstration, and Handouts Education comprehension: verbalized understanding and returned demonstration   HOME EXERCISE PROGRAM:  Access Code: [email or web address] URL: [email or web address] Date: 08/31/2023 Prepared by: Consuela Mimes  Exercises - Seated Hamstring Stretch  - 2 x daily - 7 x weekly - 3 sets - 30sec hold - Active Straight Leg Raise with Quad Set  - 2 x daily - 7 x weekly - 2 sets - 10 reps   ASSESSMENT:  CLINICAL IMPRESSION:   Patient does have significant erythema, swelling, and purulent drainage along mid-length of incision consistent with infection; pt is on day 4 of antibiotic medication and is seeing substantial improvement. Superior portion of incision is no longer erythematous. Patient does demonstrate good knee ROM at this time and sound quadriceps control as exhibited by SLR for 3x10 with no quad lag. We will continue monitoring condition of post-op incision and f/u with MD if infection is not resolving.  Pt will continue to benefit from skilled PT services to address deficits and improve function.  OBJECTIVE IMPAIRMENTS: Abnormal gait, decreased balance, decreased mobility, difficulty walking, decreased ROM, decreased strength, increased edema, impaired flexibility, and pain.   ACTIVITY LIMITATIONS: carrying, lifting, bending, sitting, squatting,  stairs, transfers, bed mobility, and locomotion level  PARTICIPATION LIMITATIONS: meal prep, cleaning, laundry, driving, shopping, community activity, and occupation  PERSONAL FACTORS: Past/current experiences, complicated orthopedic history, and 3+ comorbidities: (Type 2 DM, diabetic nephropathy, anxiety, anemia, HTN, and aortic atherosclerosis) are also affecting patient's functional outcome.   REHAB POTENTIAL: Good  CLINICAL DECISION MAKING: Evolving/moderate complexity  EVALUATION COMPLEXITY: Moderate   GOALS: Goals reviewed with patient? Yes  SHORT TERM GOALS: Target date: 09/21/2023  Pt will be independent with HEP to improve strength and decrease knee pain to improve pain-free function at home and work. Baseline: 08/31/23: Home health exercises reviewed; HEP updated and new handout given to pt.  Goal status: INITIAL  By 4 weeks pt will perform TUG in less than 14 seconds indicative of improved home mobility and reduced fall risk Baseline: 08/31/23: 15.02 per home health baseline Goal status: INITIAL   LONG TERM GOALS: Target date: 10/26/2023  Pt will increase FOTO to at least 58 to demonstrate significant improvement in function at home and work related to knee pain  Baseline: 08/31/23: 41 Goal status: INITIAL  2.  Pt will decrease worst knee pain no more than 2-3/10 on the NPRS in order to demonstrate clinically significant reduction in knee pain. Baseline: 08/31/23: 10/10 at worst Goal status: INITIAL  3.  Pt will demonstrate safe reciprocal negotiation of flight of stairs without LOB or buckling of post-op knee as needed for accessing second level of home to complete her workout routine (Bowflex on 2nd level) Baseline: 08/31/23: Patient requires cueing and demo for negotiation of 4 steps with FWW.  Goal status: INITIAL  4.  Pt will increase strength of R quadriceps and tested hip musculature to 4+/5 MMT grade or greater in order to demonstrate improvement in  strength and function  Baseline: 08/31/23: Quads and hip flexors 4-/5 Goal status: INITIAL  5.  Pt will ambulate 660 feet or greater with no AD and no increase in knee pain without significant gait deviation or decreased stance time as needed for independent community-level ambulation  Baseline: 08/31/23: Pt ambulates at limited household distance with FWW. Goal status: INITIAL   PLAN: PT FREQUENCY: 1-2x/week  PT DURATION: 8 weeks  PLANNED INTERVENTIONS: Therapeutic exercises, Therapeutic activity, Neuromuscular re-education, Balance training, Gait training, Patient/Family education, Self Care, Joint mobilization, Electrical  stimulation, Cryotherapy, Moist heat, Manual therapy, and Re-evaluation.  PLAN FOR NEXT SESSION: Progress knee ROM as tolerated, low volume and low impact CKC activities, quad strengthening. Progress with gait training and AD wean as able.     Consuela Mimes, PT, DPT #[id number]  Andrea Zhang, PT 09/26/2023, 9:06 AM

## 2023-09-28 ENCOUNTER — Ambulatory Visit: Payer: BC Managed Care – PPO | Admitting: Physical Therapy

## 2023-09-28 ENCOUNTER — Encounter: Payer: Self-pay | Admitting: Physical Therapy

## 2023-09-28 DIAGNOSIS — M25561 Pain in right knee: Secondary | ICD-10-CM | POA: Diagnosis not present

## 2023-09-28 DIAGNOSIS — R262 Difficulty in walking, not elsewhere classified: Secondary | ICD-10-CM

## 2023-09-28 DIAGNOSIS — M25661 Stiffness of right knee, not elsewhere classified: Secondary | ICD-10-CM

## 2023-09-28 DIAGNOSIS — M6281 Muscle weakness (generalized): Secondary | ICD-10-CM

## 2023-09-28 NOTE — Therapy (Signed)
OUTPATIENT PHYSICAL THERAPY TREATMENT  Patient Name: Andrea Zhang MRN: [id number] DOB:Aug 08, 1961, 62 y.o., female Today's Date: 09/28/2023  END OF SESSION:  PT End of Session - 09/28/23 0918     Visit Number 9    Number of Visits 17    Date for PT Re-Evaluation 10/26/23    Authorization Type [employer]    PT Start Time [phone]    PT Stop Time 0943    PT Time Calculation (min) 40 min    Activity Tolerance Patient tolerated treatment well;No increased pain              Past Medical History:  Diagnosis Date   Anemia    iron deficiency and b12 deficiency   Anxiety    Aortic atherosclerosis (HCC)    Arthritis    Asthma    Diabetes mellitus without complication (HCC)    Fasciitis    left foot   GERD (gastroesophageal reflux disease)    History of kidney stones    Hypercholesterolemia    Hypertension    Migraines    MIGRAINES HAVE IMPROVED SINCE RECEIVING IRON   Pneumonia    Primary osteoarthritis of right knee    Tachycardia    Past Surgical History:  Procedure Laterality Date   CHOLECYSTECTOMY  2004   COLONOSCOPY WITH ESOPHAGOGASTRODUODENOSCOPY (EGD)  02/2018   ESOPHAGOGASTRODUODENOSCOPY N/A 10/16/2020   Procedure: ESOPHAGOGASTRODUODENOSCOPY (EGD);  Surgeon: Regis Bill, MD;  Location: [facility] ENDOSCOPY;  Service: Endoscopy;  Laterality: N/A;   ESOPHAGOGASTRODUODENOSCOPY (EGD) WITH PROPOFOL N/A 06/06/2016   Procedure: ESOPHAGOGASTRODUODENOSCOPY (EGD) WITH PROPOFOL;  Surgeon: Scot Jun, MD;  Location: [facility] ENDOSCOPY;  Service: Endoscopy;  Laterality: N/A;   EXTRACORPOREAL SHOCK WAVE LITHOTRIPSY  2010   JOINT REPLACEMENT  2013   LT TKR   SAVORY DILATION N/A 06/06/2016   Procedure: SAVORY DILATION;  Surgeon: Scot Jun, MD;  Location: [facility] ENDOSCOPY;  Service: Endoscopy;  Laterality: N/A;   SAVORY DILATION  02/2018   SHOULDER ARTHROSCOPY WITH OPEN ROTATOR CUFF REPAIR Right 05/24/2018   Procedure: right shoulder arthroscopy, extensive  arthroscopic debridement, decompression, open rotator cuff repair, biceps tenodesis;  Surgeon: Christena Flake, MD;  Location: [facility] ORS;  Service: Orthopedics;  Laterality: Right;   TONSILLECTOMY  1978   TOTAL KNEE ARTHROPLASTY Right 08/15/2023   Procedure: TOTAL KNEE ARTHROPLASTY;  Surgeon: Christena Flake, MD;  Location: [facility] ORS;  Service: Orthopedics;  Laterality: Right;   Patient Active Problem List   Diagnosis Date Noted   Status post total knee replacement using cement, right 08/15/2023   Coronary artery calcification 09/09/2019   Aortic atherosclerosis (HCC) 09/09/2019   Allergic rhinitis 08/06/2019   Asthma 08/06/2019   Diabetes mellitus, type 2 (HCC) 08/06/2019   Dysphagia 08/06/2019   GERD (gastroesophageal reflux disease) 08/06/2019   Hypercalcemia 08/06/2019   Hypertension 08/06/2019   Migraines 08/06/2019   Osteoarthritis 08/06/2019   Folate deficiency 03/03/2019   Morbid obesity with BMI of 40.0-44.9, adult (HCC) 06/05/2018   Status post right rotator cuff repair 06/05/2018   Degenerative joint disease of right shoulder 05/24/2018   Degenerative tear of glenoid labrum, right 05/24/2018   Injury of tendon of long head of right biceps 05/24/2018   Nontraumatic complete tear of right rotator cuff 05/14/2018   Rotator cuff tendinitis, right 05/14/2018   Iron deficiency anemia 01/15/2018   B12 deficiency 09/07/2017   Microalbuminuric diabetic nephropathy (HCC) 09/07/2017   Primary osteoarthritis of right knee 02/19/2015   Restless leg syndrome 10/25/2012  Hyperlipidemia with target LDL less than 100 05/20/2012    PCP: Dalia Heading, MD  REFERRING PROVIDER: Dedra Skeens, PA-C  REFERRING DIAG: [phone] (ICD-10-CM) - Presence of right artificial knee joint   RATIONALE FOR EVALUATION AND TREATMENT: Rehabilitation  THERAPY DIAG: Acute pain of right knee  Stiffness of right knee, not elsewhere classified  Difficulty in walking, not elsewhere classified  Muscle  weakness (generalized)  ONSET DATE: R TKA 08/15/23  FOLLOW-UP APPT SCHEDULED WITH REFERRING PROVIDER: Not yet scheduled, staples removed yesterday   PERTINENT HISTORY: Pt is a 62 year old female s/p R TKA 08/15/23. Hx of L TKA 11 years ago. Patient reports notable pain with staples in incision and attempting to bend knee - they were removed yesterday. Pt reports wearing shoes for first time Tuesday night and had notable pain from bunion in L foot. She is wearing thicker sock on L foot now and this seems to help. Patient reports using wide size shoes to accommodate her bunion. Patient has completed home health therapy. Per home health paperwork, SPPB 10/12, TUG 15.02 sec.   PAIN:   Pain Intensity: Present: 3/10, Best: 0/10, Worst: 10/10 Pain location: Posterior knee/popliteal region Pain quality: aching  Radiating pain: Yes , feels bruised into shin  Swelling: Yes   Numbness/Tingling: No Aggravating factors: bending knee, prolonged sitting Relieving factors: ice/cold compression machine,   History of prior back, hip, or knee injury, pain, surgery, or therapy: Yes; hx of bilateral bunion that is sypmtomatic   Imaging: Yes ;  CLINICAL DATA:  Postop knee replacement. DG 08/15/23   EXAM: PORTABLE RIGHT KNEE - 1-2 VIEW   COMPARISON:  None Available.   FINDINGS: Right knee arthroplasty in expected alignment. No periprosthetic lucency or fracture. There has been patellar resurfacing. Recent postsurgical change includes air and edema in the soft tissues and joint space. Anterior skin staples in place.   IMPRESSION: Right knee arthroplasty without immediate postoperative complication.   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 No  Living Environment Lives with: lives with their spouse; her son stayed with pt first week after surgery. Husband is back to work. Pt is at home alone during work week at this time. Pt has sister who is hemiplegic who previously stayed with her.  Lives in: House/apartment Ramped entry, grab bar installed. Step-in shower. Bench in shower. Pt uses raised commode seat to sit down and get dressed. Concrete driveway to enter home.   Patient Goals: No assistive device, able to sit at her desk for work at her computer.    OBJECTIVE:     Gross Musculoskeletal Assessment Tremor: None Bulk: Atrophy of R>L quadriceps Tone: Normal No sign of acute infection   GAIT: Distance walked: 30 ft Assistive device utilized: Walker - 2 wheeled Level of assistance: SBA Comments: Dec terminal knee extension, dec heel strike, dec step length R, dec stance time R/antalgic pattern    AROM AROM (Normal range in degrees) AROM  Hip Right 08/31/23 Left 08/31/23  Flexion (125)    Extension (15)    Abduction (40)    Adduction     Internal Rotation (45)    External Rotation (45)  Knee    Flexion (135) 101 135  Extension (0) 0 +3      Ankle    Dorsiflexion (20) WNL WNL  Plantarflexion (50) WNL WNL  Inversion (35)    Eversion (15    (* = pain; Blank rows = not tested)  LE MMT: MMT (out of 5) Right 08/31/23 Left 08/31/23  Hip flexion 4- 4  Hip extension    Hip abduction (seated) 5 5  Hip adduction (seated) 5 5  Hip internal rotation    Hip external rotation    Knee flexion 5 5  Knee extension 4- 5  Ankle dorsiflexion    Ankle plantarflexion    Ankle inversion    Ankle eversion    (* = pain; Blank rows = not tested)  *Pt demonstrates SLR with no extensor lag   Sensation Deferred  Reflexes Deferred  Muscle Length Hamstrings: R: Positive L: Negative Quadriceps  ([place]): R: Not examined L: Not examined     TODAY'S TREATMENT: 09/28/23    SUBJECTIVE STATEMENT:   Pt reports pain mainly around region of infection, mid-length of incision. "Manageable" 1-2/10 pain at arrival. Pt reports body temp up to 99.2, usually 98.6 deg F.    *Moderate erythema along mid-length of incision, small open region with serous drainage today - no purulent drainage.     Manual Therapy - for symptom modulation, soft tissue sensitivity and mobility, joint mobility, ROM   R Knee PROM within pt tolerance; x 15 repetitions moving into flexion and extension as tolerated  *Grossly WNL PROM noted today     Therapeutic Exercise - for knee ROM, strengthening to improve ability to perform closed-chain functional movements e.g. transfers, squatting, stair negotiation  NuStep level 5 min (seat 5) x 5 minutes to improve L knee mobility and cardiorespiratory endurance.    Supine SAQ with blue bolster under foot; 2x10, 3 second hold, 4# AW SLR; 1x10 with no weight, 2x10 with 2-lb weight; Bridge, 1 sec hold at top; 2 x 10 Seated LAQ 5# AW 2 x 10 each LE;   Dynamic march along blue agility; 5x D/B  Sit to stand from standard-height chair; 2x8    PATIENT EDUCATION:  Discussed monitoring for body temp or worsening of mid-length of incision at home; advised finishing antibiotic as prescribed by MD and discussed with MD remaining erythema, swelling, and pain along mid-length of incision.     Cold pack (unbilled) - for anti-inflammatory and analgesic effect as needed for reduced pain and improved ability to participate in active PT intervention, along R knee in supine with 2 pillows under R calf to elevate lower limb slightly, x 5 minutes   *next visit* Forward step up; 6-inch step, unilateral LUE support; 1x12   *not today* Standing knee flexion stretch on 2nd step x 10 with 5 second hold Low load, long duration stretch for knee extension, lower leg resting on pillow,  5-lb cuff weight; x 2 minutes Forward to retro step on blue agility ladder; 4x D/B for terminal knee extension and gait stability Standing marching 3# AW 2 x 10 each LE   Standing hip abduction 3# AW 2 x 10 each LE  Standing hamstring curls 2#AW 2 x 10 each LE  Seated hamstring stretch 3 x 30 seconds     PATIENT EDUCATION:  Education details: see above for patient education details Person educated: Patient Education method: Explanation, Demonstration, and Handouts Education comprehension: verbalized understanding and returned demonstration   HOME EXERCISE PROGRAM:  Access  Code: [email or web address] URL: [email or web address] Date: 08/31/2023 Prepared by: Consuela Mimes  Exercises - Seated Hamstring Stretch  - 2 x daily - 7 x weekly - 3 sets - 30sec hold - Active Straight Leg Raise with Quad Set  - 2 x daily - 7 x weekly - 2 sets - 10 reps   ASSESSMENT:  CLINICAL IMPRESSION:   Condition of patient's post-op incision has improved relative to earlier this week. Mild drainage at mid-length of incision is serous versus purulent drainage noted earlier in the week. She does have remaining knee pain that is exacerbated with knee flexion. Pt does need to further f/u with MD regarding ongoing mild symptoms with her antibiotic almost being complete. Pt has good R knee ROM at this time and is otherwise progressing well; we will need to ensure proper management of infection. Pt will continue to benefit from skilled PT services to address deficits and improve function.  OBJECTIVE IMPAIRMENTS: Abnormal gait, decreased balance, decreased mobility, difficulty walking, decreased ROM, decreased strength, increased edema, impaired flexibility, and pain.   ACTIVITY LIMITATIONS: carrying, lifting, bending, sitting, squatting, stairs, transfers, bed mobility, and locomotion level  PARTICIPATION LIMITATIONS: meal prep, cleaning, laundry, driving, shopping, community activity, and  occupation  PERSONAL FACTORS: Past/current experiences, complicated orthopedic history, and 3+ comorbidities: (Type 2 DM, diabetic nephropathy, anxiety, anemia, HTN, and aortic atherosclerosis) are also affecting patient's functional outcome.   REHAB POTENTIAL: Good  CLINICAL DECISION MAKING: Evolving/moderate complexity  EVALUATION COMPLEXITY: Moderate   GOALS: Goals reviewed with patient? Yes  SHORT TERM GOALS: Target date: 09/21/2023  Pt will be independent with HEP to improve strength and decrease knee pain to improve pain-free function at home and work. Baseline: 08/31/23: Home health exercises reviewed; HEP updated and new handout given to pt.  Goal status: INITIAL  By 4 weeks pt will perform TUG in less than 14 seconds indicative of improved home mobility and reduced fall risk Baseline: 08/31/23: 15.02 per home health baseline Goal status: INITIAL   LONG TERM GOALS: Target date: 10/26/2023  Pt will increase FOTO to at least 58 to demonstrate significant improvement in function at home and work related to knee pain  Baseline: 08/31/23: 41 Goal status: INITIAL  2.  Pt will decrease worst knee pain no more than 2-3/10 on the NPRS in order to demonstrate clinically significant reduction in knee pain. Baseline: 08/31/23: 10/10 at worst Goal status: INITIAL  3.  Pt will demonstrate safe reciprocal negotiation of flight of stairs without LOB or buckling of post-op knee as needed for accessing second level of home to complete her workout routine (Bowflex on 2nd level) Baseline: 08/31/23: Patient requires cueing and demo for negotiation of 4 steps with FWW.  Goal status: INITIAL  4.  Pt will increase strength of R quadriceps and tested hip musculature to 4+/5 MMT grade or greater in order to demonstrate improvement in strength and function  Baseline: 08/31/23: Quads and hip flexors 4-/5 Goal status: INITIAL  5.  Pt will ambulate 660 feet or greater with no AD and no increase  in knee pain without significant gait deviation or decreased stance time as needed for independent community-level ambulation  Baseline: 08/31/23: Pt ambulates at limited household distance with FWW. Goal status: INITIAL   PLAN: PT FREQUENCY: 1-2x/week  PT DURATION: 8 weeks  PLANNED INTERVENTIONS: Therapeutic exercises, Therapeutic activity, Neuromuscular re-education, Balance training, Gait training, Patient/Family education, Self Care, Joint mobilization, Electrical stimulation, Cryotherapy, Moist heat, Manual therapy, and Re-evaluation.  PLAN FOR NEXT SESSION: Progress knee  ROM as tolerated, low volume and low impact CKC activities, quad strengthening. Progress with gait training and AD wean as able.     Consuela Mimes, PT, DPT #[id number]  Gertie Exon, PT 09/28/2023, 9:18 AM

## 2023-10-02 ENCOUNTER — Telehealth: Payer: Self-pay | Admitting: *Deleted

## 2023-10-02 ENCOUNTER — Other Ambulatory Visit: Payer: Self-pay | Admitting: *Deleted

## 2023-10-02 DIAGNOSIS — D509 Iron deficiency anemia, unspecified: Secondary | ICD-10-CM

## 2023-10-02 NOTE — Telephone Encounter (Signed)
Pt. Called in about:pt called and wants to know if she can come to get labs. She said she is really tired and dizzy and has a few other things going on that let her know something might be going on. She wants a call back.  I called the pt. And she feels that anemia is low. Per Smith Robert she can come over tom. For labs to see what her numbers are . She is agreeable. For the appt. 11/26 at 10:30

## 2023-10-03 ENCOUNTER — Ambulatory Visit: Payer: BC Managed Care – PPO | Admitting: Physical Therapy

## 2023-10-03 ENCOUNTER — Inpatient Hospital Stay: Payer: BC Managed Care – PPO | Attending: Oncology

## 2023-10-03 DIAGNOSIS — M6281 Muscle weakness (generalized): Secondary | ICD-10-CM

## 2023-10-03 DIAGNOSIS — D509 Iron deficiency anemia, unspecified: Secondary | ICD-10-CM | POA: Diagnosis present

## 2023-10-03 DIAGNOSIS — Z79899 Other long term (current) drug therapy: Secondary | ICD-10-CM | POA: Diagnosis not present

## 2023-10-03 DIAGNOSIS — R262 Difficulty in walking, not elsewhere classified: Secondary | ICD-10-CM

## 2023-10-03 DIAGNOSIS — M25561 Pain in right knee: Secondary | ICD-10-CM | POA: Diagnosis not present

## 2023-10-03 DIAGNOSIS — M25661 Stiffness of right knee, not elsewhere classified: Secondary | ICD-10-CM

## 2023-10-03 LAB — CBC
HCT: 44.7 % (ref 36.0–46.0)
Hemoglobin: 14.7 g/dL (ref 12.0–15.0)
MCH: 29.7 pg (ref 26.0–34.0)
MCHC: 32.9 g/dL (ref 30.0–36.0)
MCV: 90.3 fL (ref 80.0–100.0)
Platelets: 402 10*3/uL — ABNORMAL HIGH (ref 150–400)
RBC: 4.95 MIL/uL (ref 3.87–5.11)
RDW: 13.8 % (ref 11.5–15.5)
WBC: 7.8 10*3/uL (ref 4.0–10.5)
nRBC: 0 % (ref 0.0–0.2)

## 2023-10-03 LAB — IRON AND TIBC
Iron: 84 ug/dL (ref 28–170)
Saturation Ratios: 29 % (ref 10.4–31.8)
TIBC: 293 ug/dL (ref 250–450)
UIBC: 209 ug/dL

## 2023-10-03 LAB — FERRITIN: Ferritin: 294 ng/mL (ref 11–307)

## 2023-10-03 NOTE — Therapy (Signed)
OUTPATIENT PHYSICAL THERAPY TREATMENT AND PROGRESS NOTE   Dates of reporting period  08/31/23   to   10/03/23   Patient Name: Andrea Zhang MRN: [id number] DOB:06-01-1961, 62 y.o., female Today's Date: 10/03/2023  END OF SESSION:  PT End of Session - 10/03/23 0908     Visit Number 10    Number of Visits 17    Date for PT Re-Evaluation 10/26/23    Authorization Type [employer]    PT Start Time [phone]    PT Stop Time 0944    PT Time Calculation (min) 41 min    Activity Tolerance Patient tolerated treatment well;No increased pain               Past Medical History:  Diagnosis Date   Anemia    iron deficiency and b12 deficiency   Anxiety    Aortic atherosclerosis (HCC)    Arthritis    Asthma    Diabetes mellitus without complication (HCC)    Fasciitis    left foot   GERD (gastroesophageal reflux disease)    History of kidney stones    Hypercholesterolemia    Hypertension    Migraines    MIGRAINES HAVE IMPROVED SINCE RECEIVING IRON   Pneumonia    Primary osteoarthritis of right knee    Tachycardia    Past Surgical History:  Procedure Laterality Date   CHOLECYSTECTOMY  2004   COLONOSCOPY WITH ESOPHAGOGASTRODUODENOSCOPY (EGD)  02/2018   ESOPHAGOGASTRODUODENOSCOPY N/A 10/16/2020   Procedure: ESOPHAGOGASTRODUODENOSCOPY (EGD);  Surgeon: Regis Bill, MD;  Location: [facility] ENDOSCOPY;  Service: Endoscopy;  Laterality: N/A;   ESOPHAGOGASTRODUODENOSCOPY (EGD) WITH PROPOFOL N/A 06/06/2016   Procedure: ESOPHAGOGASTRODUODENOSCOPY (EGD) WITH PROPOFOL;  Surgeon: Scot Jun, MD;  Location: [facility] ENDOSCOPY;  Service: Endoscopy;  Laterality: N/A;   EXTRACORPOREAL SHOCK WAVE LITHOTRIPSY  2010   JOINT REPLACEMENT  2013   LT TKR   SAVORY DILATION N/A 06/06/2016   Procedure: SAVORY DILATION;  Surgeon: Scot Jun, MD;  Location: [facility] ENDOSCOPY;  Service: Endoscopy;  Laterality: N/A;   SAVORY DILATION  02/2018   SHOULDER ARTHROSCOPY WITH OPEN ROTATOR CUFF  REPAIR Right 05/24/2018   Procedure: right shoulder arthroscopy, extensive arthroscopic debridement, decompression, open rotator cuff repair, biceps tenodesis;  Surgeon: Christena Flake, MD;  Location: [facility] ORS;  Service: Orthopedics;  Laterality: Right;   TONSILLECTOMY  1978   TOTAL KNEE ARTHROPLASTY Right 08/15/2023   Procedure: TOTAL KNEE ARTHROPLASTY;  Surgeon: Christena Flake, MD;  Location: [facility] ORS;  Service: Orthopedics;  Laterality: Right;   Patient Active Problem List   Diagnosis Date Noted   Status post total knee replacement using cement, right 08/15/2023   Coronary artery calcification 09/09/2019   Aortic atherosclerosis (HCC) 09/09/2019   Allergic rhinitis 08/06/2019   Asthma 08/06/2019   Diabetes mellitus, type 2 (HCC) 08/06/2019   Dysphagia 08/06/2019   GERD (gastroesophageal reflux disease) 08/06/2019   Hypercalcemia 08/06/2019   Hypertension 08/06/2019   Migraines 08/06/2019   Osteoarthritis 08/06/2019   Folate deficiency 03/03/2019   Morbid obesity with BMI of 40.0-44.9, adult (HCC) 06/05/2018   Status post right rotator cuff repair 06/05/2018   Degenerative joint disease of right shoulder 05/24/2018   Degenerative tear of glenoid labrum, right 05/24/2018   Injury of tendon of long head of right biceps 05/24/2018   Nontraumatic complete tear of right rotator cuff 05/14/2018   Rotator cuff tendinitis, right 05/14/2018   Iron deficiency anemia 01/15/2018   B12 deficiency 09/07/2017   Microalbuminuric diabetic  nephropathy (HCC) 09/07/2017   Primary osteoarthritis of right knee 02/19/2015   Restless leg syndrome 10/25/2012   Hyperlipidemia with target LDL less than 100 05/20/2012    PCP: Dalia Heading, MD  REFERRING PROVIDER: Dedra Skeens, PA-C  REFERRING DIAG: [phone] (ICD-10-CM) - Presence of right artificial knee joint   RATIONALE FOR EVALUATION AND TREATMENT: Rehabilitation  THERAPY DIAG: Acute pain of right knee  Stiffness of right knee, not elsewhere  classified  Difficulty in walking, not elsewhere classified  Muscle weakness (generalized)  ONSET DATE: R TKA 08/15/23  FOLLOW-UP APPT SCHEDULED WITH REFERRING PROVIDER: Not yet scheduled, staples removed yesterday   PERTINENT HISTORY: Pt is a 62 year old female s/p R TKA 08/15/23. Hx of L TKA 11 years ago. Patient reports notable pain with staples in incision and attempting to bend knee - they were removed yesterday. Pt reports wearing shoes for first time Tuesday night and had notable pain from bunion in L foot. She is wearing thicker sock on L foot now and this seems to help. Patient reports using wide size shoes to accommodate her bunion. Patient has completed home health therapy. Per home health paperwork, SPPB 10/12, TUG 15.02 sec.   PAIN:   Pain Intensity: Present: 3/10, Best: 0/10, Worst: 10/10 Pain location: Posterior knee/popliteal region Pain quality: aching  Radiating pain: Yes , feels bruised into shin  Swelling: Yes   Numbness/Tingling: No Aggravating factors: bending knee, prolonged sitting Relieving factors: ice/cold compression machine,   History of prior back, hip, or knee injury, pain, surgery, or therapy: Yes; hx of bilateral bunion that is sypmtomatic   Imaging: Yes ;  CLINICAL DATA:  Postop knee replacement. DG 08/15/23   EXAM: PORTABLE RIGHT KNEE - 1-2 VIEW   COMPARISON:  None Available.   FINDINGS: Right knee arthroplasty in expected alignment. No periprosthetic lucency or fracture. There has been patellar resurfacing. Recent postsurgical change includes air and edema in the soft tissues and joint space. Anterior skin staples in place.   IMPRESSION: Right knee arthroplasty without immediate postoperative complication.   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 No  Living Environment Lives with: lives with their spouse; her son stayed with pt first week after surgery. Husband is back to work. Pt is at home alone during work week at this time. Pt has sister who is hemiplegic who previously stayed with her.  Lives in: House/apartment Ramped entry, grab bar installed. Step-in shower. Bench in shower. Pt uses raised commode seat to sit down and get dressed. Concrete driveway to enter home.   Patient Goals: No assistive device, able to sit at her desk for work at her computer.    OBJECTIVE:     Gross Musculoskeletal Assessment Tremor: None Bulk: Atrophy of R>L quadriceps Tone: Normal No sign of acute infection   GAIT: Distance walked: 30 ft Assistive device utilized: Walker - 2 wheeled Level of assistance: SBA Comments: Dec terminal knee extension, dec heel strike, dec step length R, dec stance time R/antalgic pattern    AROM AROM (Normal range in degrees) AROM 08/31/23 AROM 10/03/23  Hip Right 08/31/23 Left 08/31/23 Right 10/03/23 Left 10/03/23  Flexion (125)      Extension (15)  Abduction (40)      Adduction       Internal Rotation (45)      External Rotation (45)            Knee      Flexion (135) 101 135 125 WNL  Extension (0) 0 +3 0 WNL        Ankle      Dorsiflexion (20) WNL WNL    Plantarflexion (50) WNL WNL    Inversion (35)      Eversion (15      (* = pain; Blank rows = not tested)  LE MMT: MMT (out of 5) Right 08/31/23 Left 08/31/23 Right 10/03/23 Left 10/03/23  Hip flexion 4- 4 4+ 5-  Hip extension      Hip abduction (seated) 5 5 5 5   Hip adduction (seated) 5 5 5 5   Hip internal rotation      Hip external rotation      Knee flexion 5 5 5 5   Knee extension 4- 5 4+ 5  Ankle dorsiflexion      Ankle plantarflexion      Ankle  inversion      Ankle eversion      (* = pain; Blank rows = not tested)  *Pt demonstrates SLR with no extensor lag   Sensation Deferred  Reflexes Deferred  Muscle Length Hamstrings: R: Positive L: Negative Quadriceps ([place]): R: Not examined L: Not examined     TODAY'S TREATMENT: 10/03/23    SUBJECTIVE STATEMENT:   Pt reports no pain this AM. Pt reports 85% global rating improvement. Pt is most limited with stairs at this time per report. Patient reports doing well with household mobility without AD. Pt reports she might need AD for outdoor/uneven terrain at the time. Pt's infection is significantly improvement. Pt reports some pain with certain angles of the knee.    *Moderate erythema along mid-length of incision, small open region with serous drainage today - no purulent drainage.     Manual Therapy - for symptom modulation, soft tissue sensitivity and mobility, joint mobility, ROM   R Knee PROM within pt tolerance; x 20 repetitions moving into flexion and extension as tolerated  *Grossly WNL PROM noted today     Therapeutic Exercise - for knee ROM, strengthening to improve ability to perform closed-chain functional movements e.g. transfers, squatting, stair negotiation   *GOAL UPDATE PERFORMED   NuStep level 5 min (seat 5) x 5 minutes to improve L knee mobility and cardiorespiratory endurance.    Supine SAQ with blue bolster under foot; 2x10, 3 second hold, 5# AW SLR; 2x10 with no weight, 2x10 with 2-lb weight; Bridge, 1 sec hold at top; 2 x 10 Seated LAQ 5# AW 2 x 10 each LE;  Dynamic march along blue agility; 5x D/B   PATIENT EDUCATION:  Discussed monitoring for body temp or worsening of mid-length of incision at home; advised finishing antibiotic as prescribed by MD and discussed with MD remaining erythema, swelling, and pain along mid-length of incision.     Cold pack (unbilled) - for anti-inflammatory and analgesic effect as needed for reduced pain  and improved ability to participate in active PT intervention, along R knee in supine with 2 pillows under R calf to elevate lower limb slightly, x 5 minutes   *next visit* Forward step up; 6-inch step, unilateral LUE support; 1x12   *not today* Sit to stand from standard-height chair; 2x8  Standing knee flexion stretch on 2nd step x  10 with 5 second hold Low load, long duration stretch for knee extension, lower leg resting on pillow, 5-lb cuff weight; x 2 minutes Forward to retro step on blue agility ladder; 4x D/B for terminal knee extension and gait stability Standing marching 3# AW 2 x 10 each LE   Standing hip abduction 3# AW 2 x 10 each LE  Standing hamstring curls 2#AW 2 x 10 each LE  Seated hamstring stretch 3 x 30 seconds     PATIENT EDUCATION:  Education details: see above for patient education details Person educated: Patient Education method: Explanation, Demonstration, and Handouts Education comprehension: verbalized understanding and returned demonstration   HOME EXERCISE PROGRAM:  Access Code: [email or web address] URL: [email or web address] Date: 08/31/2023 Prepared by: Consuela Mimes  Exercises - Seated Hamstring Stretch  - 2 x daily - 7 x weekly - 3 sets - 30sec hold - Active Straight Leg Raise with Quad Set  - 2 x daily - 7 x weekly - 2 sets - 10 reps   ASSESSMENT:  CLINICAL IMPRESSION:   Pt has met both short-term goals and 3/5 long-term goals. She demonstrates largely normal gait pattern without significant antalgic gait. She does have pain along mid-length of incision where there is small open wound bed - pt had clear signs of infection last week that are largely improving with antibiotic treatment. No purulent fluid at this time; some serous/serosanguinous drainage noted on her bandage. Pt has good R knee ROM and sound quadriceps control. She is able to readily perform sit to stand and bed mobility tasks. She demonstrates fair stair negotiation - some  limited eccentric control with stepping down with contralateral lower limb. Pt is able to safely ascend and descend 4-step staircase in gym today. Pt has met strength goal, FOTO goal, and gait goal. She has experienced relatively high NPRS due to comorbid infection along operative incision - fortunately, she has no pain at rest today. We will continue to closely monitor for signs/symptoms of infection as pt continues antibiotic therapy per MD and follow-up with MD as needed if condition of wound worsens. Pt has remaining deficits in hip and quad strength, anterior knee pain that is intermittent with activity, and difficulties with stair/obstacle/uneven terrain negotiation. Pt will continue to benefit from continued skilled PT services to address deficits and improve function.   OBJECTIVE IMPAIRMENTS: Abnormal gait, decreased balance, decreased mobility, difficulty walking, decreased ROM, decreased strength, increased edema, impaired flexibility, and pain.   ACTIVITY LIMITATIONS: carrying, lifting, bending, sitting, squatting, stairs, transfers, bed mobility, and locomotion level  PARTICIPATION LIMITATIONS: meal prep, cleaning, laundry, driving, shopping, community activity, and occupation  PERSONAL FACTORS: Past/current experiences, complicated orthopedic history, and 3+ comorbidities: (Type 2 DM, diabetic nephropathy, anxiety, anemia, HTN, and aortic atherosclerosis) are also affecting patient's functional outcome.   REHAB POTENTIAL: Good  CLINICAL DECISION MAKING: Evolving/moderate complexity  EVALUATION COMPLEXITY: Moderate   GOALS: Goals reviewed with patient? Yes  SHORT TERM GOALS: Target date: 09/21/2023  Pt will be independent with HEP to improve strength and decrease knee pain to improve pain-free function at home and work. Baseline: 08/31/23: Home health exercises reviewed; HEP updated and new handout given to pt.   10/03/23: Pt is compliant with HEP and verbalizes understanding of  exercises given.  Goal status: ACHIEVED  By 4 weeks pt will perform TUG in less than 14 seconds indicative of improved home mobility and reduced fall risk Baseline: 08/31/23: 15.02 per home health baseline   10/03/23: 9.4 seconds.  Goal status: ACHIEVED  LONG TERM GOALS: Target date: 10/26/2023  Pt will increase FOTO to at least 58 to demonstrate significant improvement in function at home and work related to knee pain  Baseline: 08/31/23: 41   10/03/23: 63/58 Goal status: ACHIEVED  2.  Pt will decrease worst knee pain no more than 2-3/10 on the NPRS in order to demonstrate clinically significant reduction in knee pain. Baseline: 08/31/23: 10/10 at worst.   10/03/23: 9/10 at worst (mainly hurting along region of infection, mid-length of incision) Goal status: NOT MET   3.  Pt will demonstrate safe reciprocal negotiation of flight of stairs without LOB or buckling of post-op knee as needed for accessing second level of home to complete her workout routine (Bowflex on 2nd level) Baseline: 08/31/23: Patient requires cueing and demo for negotiation of 4 steps with FWW.    10/03/23: pt able to negotiate 4-step staircase in gym x 3 with single handrail use and no buckling or pain Goal status: MOSTLY MET   4.  Pt will increase strength of R quadriceps and tested hip musculature to 4+/5 MMT grade or greater in order to demonstrate improvement in strength and function  Baseline: 08/31/23: Quads and hip flexors 4-/5.   10/03/23: met for all tested musculature (see chart above) Goal status: ACHIEVED  5.  Pt will ambulate 660 feet or greater with no AD and no increase in knee pain without significant gait deviation or decreased stance time as needed for independent community-level ambulation  Baseline: 08/31/23: Pt ambulates at limited household distance with FWW.   10/03/23: Ambulated > 660 feet with no buckling and no increase in knee pain.  Goal status: ACHIEVED   PLAN: PT FREQUENCY:  1-2x/week  PT DURATION: 4 weeks  PLANNED INTERVENTIONS: Therapeutic exercises, Therapeutic activity, Neuromuscular re-education, Balance training, Gait training, Patient/Family education, Self Care, Joint mobilization, Electrical stimulation, Cryotherapy, Moist heat, Manual therapy, and Re-evaluation.  PLAN FOR NEXT SESSION: Continue with advanced-phase rehab with focus on progressive standing/stepping activity, stair negotiation, CKC strengthening. Complete outdoor/uneven terrain tasks as able. Monitor for infection along operative incision and refer out if needed.     Consuela Mimes, PT, DPT #[id number]  Gertie Exon, PT 10/03/2023, 9:08 AM

## 2023-10-09 NOTE — Therapy (Signed)
OUTPATIENT PHYSICAL THERAPY TREATMENT AND PROGRESS NOTE   Dates of reporting period  08/31/23   to   10/03/23   Patient Name: Andrea Zhang MRN: [id number] DOB:May 23, 1961, 62 y.o., female Today's Date: 10/09/2023  END OF SESSION:      Past Medical History:  Diagnosis Date   Anemia    iron deficiency and b12 deficiency   Anxiety    Aortic atherosclerosis (HCC)    Arthritis    Asthma    Diabetes mellitus without complication (HCC)    Fasciitis    left foot   GERD (gastroesophageal reflux disease)    History of kidney stones    Hypercholesterolemia    Hypertension    Migraines    MIGRAINES HAVE IMPROVED SINCE RECEIVING IRON   Pneumonia    Primary osteoarthritis of right knee    Tachycardia    Past Surgical History:  Procedure Laterality Date   CHOLECYSTECTOMY  2004   COLONOSCOPY WITH ESOPHAGOGASTRODUODENOSCOPY (EGD)  02/2018   ESOPHAGOGASTRODUODENOSCOPY N/A 10/16/2020   Procedure: ESOPHAGOGASTRODUODENOSCOPY (EGD);  Surgeon: Regis Bill, MD;  Location: [facility] ENDOSCOPY;  Service: Endoscopy;  Laterality: N/A;   ESOPHAGOGASTRODUODENOSCOPY (EGD) WITH PROPOFOL N/A 06/06/2016   Procedure: ESOPHAGOGASTRODUODENOSCOPY (EGD) WITH PROPOFOL;  Surgeon: Scot Jun, MD;  Location: [facility] ENDOSCOPY;  Service: Endoscopy;  Laterality: N/A;   EXTRACORPOREAL SHOCK WAVE LITHOTRIPSY  2010   JOINT REPLACEMENT  2013   LT TKR   SAVORY DILATION N/A 06/06/2016   Procedure: SAVORY DILATION;  Surgeon: Scot Jun, MD;  Location: [facility] ENDOSCOPY;  Service: Endoscopy;  Laterality: N/A;   SAVORY DILATION  02/2018   SHOULDER ARTHROSCOPY WITH OPEN ROTATOR CUFF REPAIR Right 05/24/2018   Procedure: right shoulder arthroscopy, extensive arthroscopic debridement, decompression, open rotator cuff repair, biceps tenodesis;  Surgeon: Christena Flake, MD;  Location: [facility] ORS;  Service: Orthopedics;  Laterality: Right;   TONSILLECTOMY  1978   TOTAL KNEE ARTHROPLASTY Right 08/15/2023   Procedure:  TOTAL KNEE ARTHROPLASTY;  Surgeon: Christena Flake, MD;  Location: [facility] ORS;  Service: Orthopedics;  Laterality: Right;   Patient Active Problem List   Diagnosis Date Noted   Status post total knee replacement using cement, right 08/15/2023   Coronary artery calcification 09/09/2019   Aortic atherosclerosis (HCC) 09/09/2019   Allergic rhinitis 08/06/2019   Asthma 08/06/2019   Diabetes mellitus, type 2 (HCC) 08/06/2019   Dysphagia 08/06/2019   GERD (gastroesophageal reflux disease) 08/06/2019   Hypercalcemia 08/06/2019   Hypertension 08/06/2019   Migraines 08/06/2019   Osteoarthritis 08/06/2019   Folate deficiency 03/03/2019   Morbid obesity with BMI of 40.0-44.9, adult (HCC) 06/05/2018   Status post right rotator cuff repair 06/05/2018   Degenerative joint disease of right shoulder 05/24/2018   Degenerative tear of glenoid labrum, right 05/24/2018   Injury of tendon of long head of right biceps 05/24/2018   Nontraumatic complete tear of right rotator cuff 05/14/2018   Rotator cuff tendinitis, right 05/14/2018   Iron deficiency anemia 01/15/2018   B12 deficiency 09/07/2017   Microalbuminuric diabetic nephropathy (HCC) 09/07/2017   Primary osteoarthritis of right knee 02/19/2015   Restless leg syndrome 10/25/2012   Hyperlipidemia with target LDL less than 100 05/20/2012    PCP: Dalia Heading, MD  REFERRING PROVIDER: Dedra Skeens, PA-C  REFERRING DIAG: [phone] (ICD-10-CM) - Presence of right artificial knee joint   RATIONALE FOR EVALUATION AND TREATMENT: Rehabilitation  THERAPY DIAG: Acute pain of right knee  Stiffness of right knee, not elsewhere classified  Difficulty in walking,  not elsewhere classified  Muscle weakness (generalized)  ONSET DATE: R TKA 08/15/23  FOLLOW-UP APPT SCHEDULED WITH REFERRING PROVIDER: Not yet scheduled, staples removed yesterday   PERTINENT HISTORY: Pt is a 62 year old female s/p R TKA 08/15/23. Hx of L TKA 11 years ago. Patient reports  notable pain with staples in incision and attempting to bend knee - they were removed yesterday. Pt reports wearing shoes for first time Tuesday night and had notable pain from bunion in L foot. She is wearing thicker sock on L foot now and this seems to help. Patient reports using wide size shoes to accommodate her bunion. Patient has completed home health therapy. Per home health paperwork, SPPB 10/12, TUG 15.02 sec.   PAIN:   Pain Intensity: Present: 3/10, Best: 0/10, Worst: 10/10 Pain location: Posterior knee/popliteal region Pain quality: aching  Radiating pain: Yes , feels bruised into shin  Swelling: Yes   Numbness/Tingling: No Aggravating factors: bending knee, prolonged sitting Relieving factors: ice/cold compression machine,   History of prior back, hip, or knee injury, pain, surgery, or therapy: Yes; hx of bilateral bunion that is sypmtomatic   Imaging: Yes ;  CLINICAL DATA:  Postop knee replacement. DG 08/15/23   EXAM: PORTABLE RIGHT KNEE - 1-2 VIEW   COMPARISON:  None Available.   FINDINGS: Right knee arthroplasty in expected alignment. No periprosthetic lucency or fracture. There has been patellar resurfacing. Recent postsurgical change includes air and edema in the soft tissues and joint space. Anterior skin staples in place.   IMPRESSION: Right knee arthroplasty without immediate postoperative complication.   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 No  Living Environment Lives with: lives with their spouse; her son stayed with pt first week after surgery. Husband is back to work. Pt is at home alone during  work week at this time. Pt has sister who is hemiplegic who previously stayed with her.  Lives in: House/apartment Ramped entry, grab bar installed. Step-in shower. Bench in shower. Pt uses raised commode seat to sit down and get dressed. Concrete driveway to enter home.   Patient Goals: No assistive device, able to sit at her desk for work at her computer.    OBJECTIVE:     Gross Musculoskeletal Assessment Tremor: None Bulk: Atrophy of R>L quadriceps Tone: Normal No sign of acute infection   GAIT: Distance walked: 30 ft Assistive device utilized: Glorya Bartley - 2 wheeled Level of assistance: SBA Comments: Dec terminal knee extension, dec heel strike, dec step length R, dec stance time R/antalgic pattern    AROM AROM (Normal range in degrees) AROM 08/31/23 AROM 10/03/23  Hip Right 08/31/23 Left 08/31/23 Right 10/03/23 Left 10/03/23  Flexion (125)      Extension (15)      Abduction (40)      Adduction       Internal Rotation (45)      External Rotation (45)            Knee      Flexion (135) 101 135 125 WNL  Extension (0) 0 +3 0 WNL        Ankle      Dorsiflexion (20) WNL WNL    Plantarflexion (50)  WNL WNL    Inversion (35)      Eversion (15      (* = pain; Blank rows = not tested)  LE MMT: MMT (out of 5) Right 08/31/23 Left 08/31/23 Right 10/03/23 Left 10/03/23  Hip flexion 4- 4 4+ 5-  Hip extension      Hip abduction (seated) 5 5 5 5   Hip adduction (seated) 5 5 5 5   Hip internal rotation      Hip external rotation      Knee flexion 5 5 5 5   Knee extension 4- 5 4+ 5  Ankle dorsiflexion      Ankle plantarflexion      Ankle inversion      Ankle eversion      (* = pain; Blank rows = not tested)  *Pt demonstrates SLR with no extensor lag   Sensation Deferred  Reflexes Deferred  Muscle Length Hamstrings: R: Positive L: Negative Quadriceps ([place]): R: Not examined L: Not examined     TODAY'S TREATMENT: 10/09/23    SUBJECTIVE STATEMENT:   ***  Pt reports no pain this AM. Pt reports 85% global rating improvement. Pt is most limited with stairs at this time per report. Patient reports doing well with household mobility without AD. Pt reports she might need AD for outdoor/uneven terrain at the time. Pt's infection is significantly improvement. Pt reports some pain with certain angles of the knee.    *Moderate erythema along mid-length of incision, small open region with serous drainage today - no purulent drainage.     Manual Therapy - for symptom modulation, soft tissue sensitivity and mobility, joint mobility, ROM   R Knee PROM within pt tolerance; x 20 repetitions moving into flexion and extension as tolerated  *Grossly WNL PROM noted today     Therapeutic Exercise - for knee ROM, strengthening to improve ability to perform closed-chain functional movements e.g. transfers, squatting, stair negotiation   *GOAL UPDATE PERFORMED   NuStep level 5 min (seat 5) x 5 minutes to improve L knee mobility and cardiorespiratory endurance.    Supine SAQ with blue bolster under foot; 2x10, 3 second hold, 5# AW SLR; 2x10 with no weight, 2x10 with 2-lb weight; Bridge, 1 sec hold at top; 2 x 10 Seated LAQ 5# AW 2 x 10 each LE;  Dynamic march along blue agility; 5x D/B   PATIENT EDUCATION:  Discussed monitoring for body temp or worsening of mid-length of incision at home; advised finishing antibiotic as prescribed by MD and discussed with MD remaining erythema, swelling, and pain along mid-length of incision.     Cold pack (unbilled) - for anti-inflammatory and analgesic effect as needed for reduced pain and improved ability to participate in active PT intervention, along R knee in supine with 2 pillows under R calf to elevate lower limb slightly, x 5 minutes   *next visit* Forward step up; 6-inch step, unilateral LUE support; 1x12   *not today* Sit to stand from standard-height chair; 2x8  Standing knee flexion stretch on 2nd  step x 10 with 5 second hold Low load, long duration stretch for knee extension, lower leg resting on pillow, 5-lb cuff weight; x 2 minutes Forward to retro step on blue agility ladder; 4x D/B for terminal knee extension and gait stability Standing marching 3# AW 2 x 10 each LE   Standing hip abduction 3# AW 2 x 10 each LE  Standing hamstring curls 2#AW 2 x 10 each LE  Seated hamstring stretch  3 x 30 seconds     PATIENT EDUCATION:  Education details: see above for patient education details Person educated: Patient Education method: Explanation, Demonstration, and Handouts Education comprehension: verbalized understanding and returned demonstration   HOME EXERCISE PROGRAM:  Access Code: [email or web address] URL: [email or web address] Date: 08/31/2023 Prepared by: Consuela Mimes  Exercises - Seated Hamstring Stretch  - 2 x daily - 7 x weekly - 3 sets - 30sec hold - Active Straight Leg Raise with Quad Set  - 2 x daily - 7 x weekly - 2 sets - 10 reps   ASSESSMENT:  CLINICAL IMPRESSION:  ***  Pt has met both short-term goals and 3/5 long-term goals. She demonstrates largely normal gait pattern without significant antalgic gait. She does have pain along mid-length of incision where there is small open wound bed - pt had clear signs of infection last week that are largely improving with antibiotic treatment. No purulent fluid at this time; some serous/serosanguinous drainage noted on her bandage. Pt has good R knee ROM and sound quadriceps control. She is able to readily perform sit to stand and bed mobility tasks. She demonstrates fair stair negotiation - some limited eccentric control with stepping down with contralateral lower limb. Pt is able to safely ascend and descend 4-step staircase in gym today. Pt has met strength goal, FOTO goal, and gait goal. She has experienced relatively high NPRS due to comorbid infection along operative incision - fortunately, she has no pain at rest  today. We will continue to closely monitor for signs/symptoms of infection as pt continues antibiotic therapy per MD and follow-up with MD as needed if condition of wound worsens. Pt has remaining deficits in hip and quad strength, anterior knee pain that is intermittent with activity, and difficulties with stair/obstacle/uneven terrain negotiation. Pt will continue to benefit from continued skilled PT services to address deficits and improve function.   OBJECTIVE IMPAIRMENTS: Abnormal gait, decreased balance, decreased mobility, difficulty walking, decreased ROM, decreased strength, increased edema, impaired flexibility, and pain.   ACTIVITY LIMITATIONS: carrying, lifting, bending, sitting, squatting, stairs, transfers, bed mobility, and locomotion level  PARTICIPATION LIMITATIONS: meal prep, cleaning, laundry, driving, shopping, community activity, and occupation  PERSONAL FACTORS: Past/current experiences, complicated orthopedic history, and 3+ comorbidities: (Type 2 DM, diabetic nephropathy, anxiety, anemia, HTN, and aortic atherosclerosis) are also affecting patient's functional outcome.   REHAB POTENTIAL: Good  CLINICAL DECISION MAKING: Evolving/moderate complexity  EVALUATION COMPLEXITY: Moderate   GOALS: Goals reviewed with patient? Yes  SHORT TERM GOALS: Target date: 09/21/2023  Pt will be independent with HEP to improve strength and decrease knee pain to improve pain-free function at home and work. Baseline: 08/31/23: Home health exercises reviewed; HEP updated and new handout given to pt.   10/03/23: Pt is compliant with HEP and verbalizes understanding of exercises given.  Goal status: ACHIEVED  By 4 weeks pt will perform TUG in less than 14 seconds indicative of improved home mobility and reduced fall risk Baseline: 08/31/23: 15.02 per home health baseline   10/03/23: 9.4 seconds.  Goal status: ACHIEVED   LONG TERM GOALS: Target date: 10/26/2023  Pt will increase FOTO  to at least 58 to demonstrate significant improvement in function at home and work related to knee pain  Baseline: 08/31/23: 41   10/03/23: 63/58 Goal status: ACHIEVED  2.  Pt will decrease worst knee pain no more than 2-3/10 on the NPRS in order to demonstrate clinically significant reduction in knee pain. Baseline: 08/31/23: 10/10 at worst.   10/03/23:  9/10 at worst (mainly hurting along region of infection, mid-length of incision) Goal status: NOT MET   3.  Pt will demonstrate safe reciprocal negotiation of flight of stairs without LOB or buckling of post-op knee as needed for accessing second level of home to complete her workout routine (Bowflex on 2nd level) Baseline: 08/31/23: Patient requires cueing and demo for negotiation of 4 steps with FWW.    10/03/23: pt able to negotiate 4-step staircase in gym x 3 with single handrail use and no buckling or pain Goal status: MOSTLY MET   4.  Pt will increase strength of R quadriceps and tested hip musculature to 4+/5 MMT grade or greater in order to demonstrate improvement in strength and function  Baseline: 08/31/23: Quads and hip flexors 4-/5.   10/03/23: met for all tested musculature (see chart above) Goal status: ACHIEVED  5.  Pt will ambulate 660 feet or greater with no AD and no increase in knee pain without significant gait deviation or decreased stance time as needed for independent community-level ambulation  Baseline: 08/31/23: Pt ambulates at limited household distance with FWW.   10/03/23: Ambulated > 660 feet with no buckling and no increase in knee pain.  Goal status: ACHIEVED   PLAN: PT FREQUENCY: 1-2x/week  PT DURATION: 4 weeks  PLANNED INTERVENTIONS: Therapeutic exercises, Therapeutic activity, Neuromuscular re-education, Balance training, Gait training, Patient/Family education, Self Care, Joint mobilization, Electrical stimulation, Cryotherapy, Moist heat, Manual therapy, and Re-evaluation.  PLAN FOR NEXT SESSION:  Continue with advanced-phase rehab with focus on progressive standing/stepping activity, stair negotiation, CKC strengthening. Complete outdoor/uneven terrain tasks as able. Monitor for infection along operative incision and refer out if needed.    Viviann Spare, PT, DPT  10/09/2023, 11:17 AM

## 2023-10-10 ENCOUNTER — Encounter: Payer: Self-pay | Admitting: Physical Therapy

## 2023-10-10 ENCOUNTER — Ambulatory Visit: Payer: BC Managed Care – PPO | Attending: Orthopedic Surgery

## 2023-10-10 DIAGNOSIS — M25661 Stiffness of right knee, not elsewhere classified: Secondary | ICD-10-CM | POA: Diagnosis present

## 2023-10-10 DIAGNOSIS — R262 Difficulty in walking, not elsewhere classified: Secondary | ICD-10-CM | POA: Diagnosis present

## 2023-10-10 DIAGNOSIS — M25561 Pain in right knee: Secondary | ICD-10-CM | POA: Insufficient documentation

## 2023-10-10 DIAGNOSIS — M6281 Muscle weakness (generalized): Secondary | ICD-10-CM | POA: Diagnosis present

## 2023-10-12 ENCOUNTER — Other Ambulatory Visit: Payer: Self-pay | Admitting: Internal Medicine

## 2023-10-12 ENCOUNTER — Other Ambulatory Visit
Admission: RE | Admit: 2023-10-12 | Discharge: 2023-10-12 | Disposition: A | Discharge status: Home / Self Care | Payer: BC Managed Care – PPO | Source: Ambulatory Visit | Attending: Internal Medicine | Admitting: Internal Medicine

## 2023-10-12 ENCOUNTER — Ambulatory Visit: Payer: BC Managed Care – PPO | Admitting: Physical Therapy

## 2023-10-12 DIAGNOSIS — R0602 Shortness of breath: Secondary | ICD-10-CM

## 2023-10-12 LAB — D-DIMER, QUANTITATIVE: D-Dimer, Quant: 0.95 ug{FEU}/mL — ABNORMAL HIGH (ref 0.00–0.50)

## 2023-10-13 ENCOUNTER — Ambulatory Visit
Admission: RE | Admit: 2023-10-13 | Discharge: 2023-10-13 | Disposition: A | Discharge status: Home / Self Care | Payer: BC Managed Care – PPO | Source: Ambulatory Visit | Attending: Internal Medicine | Admitting: Internal Medicine

## 2023-10-13 DIAGNOSIS — R0602 Shortness of breath: Secondary | ICD-10-CM | POA: Insufficient documentation

## 2023-10-13 LAB — POCT I-STAT, CHEM 8
BUN: 7 mg/dL — ABNORMAL LOW (ref 8–23)
Calcium, Ion: 1.44 mmol/L — ABNORMAL HIGH (ref 1.15–1.40)
Chloride: 100 mmol/L (ref 98–111)
Creatinine, Ser: 0.7 mg/dL (ref 0.44–1.00)
Glucose, Bld: 147 mg/dL — ABNORMAL HIGH (ref 70–99)
HCT: 45 % (ref 36.0–46.0)
Hemoglobin: 15.3 g/dL — ABNORMAL HIGH (ref 12.0–15.0)
Potassium: 4.3 mmol/L (ref 3.5–5.1)
Sodium: 139 mmol/L (ref 135–145)
TCO2: 27 mmol/L (ref 22–32)

## 2023-10-13 MED ORDER — PREDNISONE 50 MG PO TABS
50.0000 mg | ORAL_TABLET | Freq: Four times a day (QID) | ORAL | Status: DC
  Filled 2023-10-13: qty 1

## 2023-10-13 MED ORDER — DIPHENHYDRAMINE HCL 25 MG PO CAPS
50.0000 mg | ORAL_CAPSULE | Freq: Once | ORAL | Status: DC
  Filled 2023-10-13: qty 2

## 2023-10-13 MED ORDER — DIPHENHYDRAMINE HCL 50 MG PO CAPS
50.0000 mg | ORAL_CAPSULE | Freq: Once | ORAL | Status: DC
Start: 2023-10-13 — End: 2023-10-13
  Filled 2023-10-13: qty 1

## 2023-10-13 MED ORDER — DIPHENHYDRAMINE HCL 50 MG/ML IJ SOLN
50.0000 mg | Freq: Once | INTRAMUSCULAR | Status: DC
Start: 2023-10-13 — End: 2023-10-13
  Filled 2023-10-13: qty 1

## 2023-10-13 MED ORDER — DIPHENHYDRAMINE HCL 50 MG/ML IJ SOLN
50.0000 mg | Freq: Once | INTRAMUSCULAR | Status: DC
  Filled 2023-10-13: qty 1

## 2023-10-13 MED ORDER — IOHEXOL 350 MG/ML SOLN
75.0000 mL | Freq: Once | INTRAVENOUS | Status: AC | PRN
  Administered 2023-10-13: 75 mL via INTRAVENOUS

## 2023-10-16 ENCOUNTER — Encounter: Payer: Self-pay | Admitting: Physical Therapy

## 2023-10-16 ENCOUNTER — Ambulatory Visit: Payer: BC Managed Care – PPO | Admitting: Physical Therapy

## 2023-10-16 DIAGNOSIS — M25661 Stiffness of right knee, not elsewhere classified: Secondary | ICD-10-CM

## 2023-10-16 DIAGNOSIS — M6281 Muscle weakness (generalized): Secondary | ICD-10-CM

## 2023-10-16 DIAGNOSIS — M25561 Pain in right knee: Secondary | ICD-10-CM

## 2023-10-16 DIAGNOSIS — R262 Difficulty in walking, not elsewhere classified: Secondary | ICD-10-CM

## 2023-10-16 NOTE — Therapy (Signed)
OUTPATIENT PHYSICAL THERAPY TREATMENT    Patient Name: Andrea Zhang MRN: [id number] DOB:[birth year], 62 y.o., female Today's Date: 10/16/2023  END OF SESSION:  PT End of Session - 10/16/23 0824     Visit Number 12    Number of Visits 17    Date for PT Re-Evaluation 10/26/23    Authorization Type [employer]    PT Start Time [phone]    PT Stop Time 0900    PT Time Calculation (min) 45 min    Activity Tolerance Patient tolerated treatment well;No increased pain                Past Medical History:  Diagnosis Date   Anemia    iron deficiency and b12 deficiency   Anxiety    Aortic atherosclerosis (HCC)    Arthritis    Asthma    Diabetes mellitus without complication (HCC)    Fasciitis    left foot   GERD (gastroesophageal reflux disease)    History of kidney stones    Hypercholesterolemia    Hypertension    Migraines    MIGRAINES HAVE IMPROVED SINCE RECEIVING IRON   Pneumonia    Primary osteoarthritis of right knee    Tachycardia    Past Surgical History:  Procedure Laterality Date   CHOLECYSTECTOMY  2004   COLONOSCOPY WITH ESOPHAGOGASTRODUODENOSCOPY (EGD)  02/2018   ESOPHAGOGASTRODUODENOSCOPY N/A 10/16/2020   Procedure: ESOPHAGOGASTRODUODENOSCOPY (EGD);  Surgeon: Regis Bill, MD;  Location: [facility] ENDOSCOPY;  Service: Endoscopy;  Laterality: N/A;   ESOPHAGOGASTRODUODENOSCOPY (EGD) WITH PROPOFOL N/A 06/06/2016   Procedure: ESOPHAGOGASTRODUODENOSCOPY (EGD) WITH PROPOFOL;  Surgeon: Scot Jun, MD;  Location: [facility] ENDOSCOPY;  Service: Endoscopy;  Laterality: N/A;   EXTRACORPOREAL SHOCK WAVE LITHOTRIPSY  2010   JOINT REPLACEMENT  2013   LT TKR   SAVORY DILATION N/A 06/06/2016   Procedure: SAVORY DILATION;  Surgeon: Scot Jun, MD;  Location: [facility] ENDOSCOPY;  Service: Endoscopy;  Laterality: N/A;   SAVORY DILATION  02/2018   SHOULDER ARTHROSCOPY WITH OPEN ROTATOR CUFF REPAIR Right 05/24/2018   Procedure: right shoulder arthroscopy, extensive  arthroscopic debridement, decompression, open rotator cuff repair, biceps tenodesis;  Surgeon: Christena Flake, MD;  Location: [facility] ORS;  Service: Orthopedics;  Laterality: Right;   TONSILLECTOMY  1978   TOTAL KNEE ARTHROPLASTY Right 08/15/2023   Procedure: TOTAL KNEE ARTHROPLASTY;  Surgeon: Christena Flake, MD;  Location: [facility] ORS;  Service: Orthopedics;  Laterality: Right;   Patient Active Problem List   Diagnosis Date Noted   Status post total knee replacement using cement, right 08/15/2023   Coronary artery calcification 09/09/2019   Aortic atherosclerosis (HCC) 09/09/2019   Allergic rhinitis 08/06/2019   Asthma 08/06/2019   Diabetes mellitus, type 2 (HCC) 08/06/2019   Dysphagia 08/06/2019   GERD (gastroesophageal reflux disease) 08/06/2019   Hypercalcemia 08/06/2019   Hypertension 08/06/2019   Migraines 08/06/2019   Osteoarthritis 08/06/2019   Folate deficiency 03/03/2019   Morbid obesity with BMI of 40.0-44.9, adult (HCC) 06/05/2018   Status post right rotator cuff repair 06/05/2018   Degenerative joint disease of right shoulder 05/24/2018   Degenerative tear of glenoid labrum, right 05/24/2018   Injury of tendon of long head of right biceps 05/24/2018   Nontraumatic complete tear of right rotator cuff 05/14/2018   Rotator cuff tendinitis, right 05/14/2018   Iron deficiency anemia 01/15/2018   B12 deficiency 09/07/2017   Microalbuminuric diabetic nephropathy (HCC) 09/07/2017   Primary osteoarthritis of right knee 02/19/2015   Restless leg  syndrome 10/25/2012   Hyperlipidemia with target LDL less than 100 05/20/2012    PCP: Myrene Buddy, NP  REFERRING PROVIDER: Dedra Skeens, PA-C  REFERRING DIAG: [phone] (ICD-10-CM) - Presence of right artificial knee joint   RATIONALE FOR EVALUATION AND TREATMENT: Rehabilitation  THERAPY DIAG: Acute pain of right knee  Stiffness of right knee, not elsewhere classified  Difficulty in walking, not elsewhere classified  Muscle  weakness (generalized)  ONSET DATE: R TKA 08/15/23  FOLLOW-UP APPT SCHEDULED WITH REFERRING PROVIDER: Not yet scheduled, staples removed yesterday   PERTINENT HISTORY: Pt is a 62 year old female s/p R TKA 08/15/23. Hx of L TKA 11 years ago. Patient reports notable pain with staples in incision and attempting to bend knee - they were removed yesterday. Pt reports wearing shoes for first time Tuesday night and had notable pain from bunion in L foot. She is wearing thicker sock on L foot now and this seems to help. Patient reports using wide size shoes to accommodate her bunion. Patient has completed home health therapy. Per home health paperwork, SPPB 10/12, TUG 15.02 sec.   PAIN:   Pain Intensity: Present: 3/10, Best: 0/10, Worst: 10/10 Pain location: Posterior knee/popliteal region Pain quality: aching  Radiating pain: Yes , feels bruised into shin  Swelling: Yes   Numbness/Tingling: No Aggravating factors: bending knee, prolonged sitting Relieving factors: ice/cold compression machine,   History of prior back, hip, or knee injury, pain, surgery, or therapy: Yes; hx of bilateral bunion that is sypmtomatic   Imaging: Yes ;  CLINICAL DATA:  Postop knee replacement. DG 08/15/23   EXAM: PORTABLE RIGHT KNEE - 1-2 VIEW   COMPARISON:  None Available.   FINDINGS: Right knee arthroplasty in expected alignment. No periprosthetic lucency or fracture. There has been patellar resurfacing. Recent postsurgical change includes air and edema in the soft tissues and joint space. Anterior skin staples in place.   IMPRESSION: Right knee arthroplasty without immediate postoperative complication.   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 No  Living Environment Lives with: lives with their spouse; her son stayed with pt first week after surgery. Husband is back to work. Pt is at home alone during work week at this time. Pt has sister who is hemiplegic who previously stayed with her.  Lives in: House/apartment Ramped entry, grab bar installed. Step-in shower. Bench in shower. Pt uses raised commode seat to sit down and get dressed. Concrete driveway to enter home.   Patient Goals: No assistive device, able to sit at her desk for work at her computer.    OBJECTIVE:     Gross Musculoskeletal Assessment Tremor: None Bulk: Atrophy of R>L quadriceps Tone: Normal No sign of acute infection   GAIT: Distance walked: 30 ft Assistive device utilized: Walker - 2 wheeled Level of assistance: SBA Comments: Dec terminal knee extension, dec heel strike, dec step length R, dec stance time R/antalgic pattern    AROM AROM (Normal range in degrees) AROM 08/31/23 AROM 10/03/23  Hip Right 08/31/23 Left 08/31/23 Right 10/03/23 Left 10/03/23  Flexion (125)      Extension (15)      Abduction (40)      Adduction  Internal Rotation (45)      External Rotation (45)            Knee      Flexion (135) 101 135 125 WNL  Extension (0) 0 +3 0 WNL        Ankle      Dorsiflexion (20) WNL WNL    Plantarflexion (50) WNL WNL    Inversion (35)      Eversion (15      (* = pain; Blank rows = not tested)  LE MMT: MMT (out of 5) Right 08/31/23 Left 08/31/23 Right 10/03/23 Left 10/03/23  Hip flexion 4- 4 4+ 5-  Hip extension      Hip abduction (seated) 5 5 5 5   Hip adduction (seated) 5 5 5 5   Hip internal rotation      Hip external rotation      Knee flexion 5 5 5 5   Knee extension 4- 5 4+ 5  Ankle dorsiflexion      Ankle plantarflexion      Ankle inversion      Ankle eversion      (* = pain; Blank rows = not  tested)  *Pt demonstrates SLR with no extensor lag   Sensation Deferred  Reflexes Deferred  Muscle Length Hamstrings: R: Positive L: Negative Quadriceps ([place]): R: Not examined L: Not examined     TODAY'S TREATMENT: 10/16/23    SUBJECTIVE STATEMENT:   Patient reports resting HR up to 108 and some episodes of lightheadedness with position change. She had test showing elevated D dimer. Pt has resumed Eliquis due to this. Patient reports she had CT showing nodule on thyroid and this is being investigated. Pt has potential kidney stone. Pt reports doing well with low-impact HEP.    *Mild erythema along mid-length of incision, small open region (2-3 mm in diameter) with small amount of pus and serous/bloody drainage   Manual Therapy - for symptom modulation, soft tissue sensitivity and mobility, joint mobility, ROM  R knee ROM within pt tolerance; x 10 reps  -full range noted without restriction Tibiofemoral mobilizations, A-P; gr I-II for pain control; 2 x 30 sec bouts Scar massage; x 3 minutes along superior portion of incision (ensured no contact within 3 cm of wound dehiscence, therapist gloved to prevent infection)  -region of infection dressed with bandage + Leukoplast cover-roll stretch dressing during scar massage   Therapeutic Exercise - for knee ROM, strengthening to improve ability to perform closed-chain functional movements e.g. transfers, squatting, stair negotiation  NuStep level 5 (seat 5) x 5 minutes to improve L knee mobility and cardiorespiratory endurance.   Seated LAQ 4# AW 2 x 12 each LE with 3 second hold ; SLR; 2x10, 4# AW  Sit to stand; 2x5  -pause for intermittent lightheadedness  Forward step up; 6-inch step, unilateral LUE support; 1x10   PATIENT EDUCATION:  Discussed monitoring for body temp or worsening of mid-length of incision at home; advised finishing antibiotic as prescribed by MD and discussed with MD remaining erythema, swelling, and  pain along mid-length of incision. HEP updated and reviewed; PT visits tapered for remainder of year due to number of visits used under patient's insurance policy.     *not today* TRX squats 2 x 10 Dynamic march along blue agility with 3# AW; 5x D/B Sit to stand from standard-height chair; 2x8  Standing knee flexion stretch on 2nd step x 10 with 5 second hold Low load, long duration stretch for knee extension,  lower leg resting on pillow, 5-lb cuff weight; x 2 minutes Forward to retro step on blue agility ladder; 4x D/B for terminal knee extension and gait stability Standing marching 3# AW 2 x 10 each LE   Standing hip abduction 3# AW 2 x 10 each LE  Standing hamstring curls 2#AW 2 x 10 each LE  Seated hamstring stretch 3 x 30 seconds     PATIENT EDUCATION:  Education details: see above for patient education details Person educated: Patient Education method: Explanation, Demonstration, and Handouts Education comprehension: verbalized understanding and returned demonstration   HOME EXERCISE PROGRAM:  Access Code: [email or web address] URL: [email or web address] Date: 08/31/2023 Prepared by: Consuela Mimes  Exercises - Seated Hamstring Stretch  - 2 x daily - 7 x weekly - 3 sets - 30sec hold - Active Straight Leg Raise with Quad Set  - 2 x daily - 7 x weekly - 2 sets - 10 reps   ASSESSMENT:  CLINICAL IMPRESSION:    Patient fortunately has had stable BP. She has intermittently experienced tachycardia at rest. This has improved with resumption of beta blocker. Patient is continuing to monitor for signs of infection along mid-length of incision. Erythema and area of dehiscence has markedly decreased. Pt is continuing topical antibiotics and keeping wound dressed. Pt demonstrates good R knee ROM and sound quad control She has minimal gait deviations at this time. She is primarily limited by region of wound dehiscence and infection (currently improving) and intermittent dizziness  with exertion for which she is following up with MD. She has mild sensitivity along superior portion of incision above area of dehiscence/infection. We advised continuing to monitor for worsening signs of infection and following up with MD urgently if she has any apparent changes that are concerning. Pt will continue to benefit from continued skilled PT services to address deficits and improve function.   OBJECTIVE IMPAIRMENTS: Abnormal gait, decreased balance, decreased mobility, difficulty walking, decreased ROM, decreased strength, increased edema, impaired flexibility, and pain.   ACTIVITY LIMITATIONS: carrying, lifting, bending, sitting, squatting, stairs, transfers, bed mobility, and locomotion level  PARTICIPATION LIMITATIONS: meal prep, cleaning, laundry, driving, shopping, community activity, and occupation  PERSONAL FACTORS: Past/current experiences, complicated orthopedic history, and 3+ comorbidities: (Type 2 DM, diabetic nephropathy, anxiety, anemia, HTN, and aortic atherosclerosis) are also affecting patient's functional outcome.   REHAB POTENTIAL: Good  CLINICAL DECISION MAKING: Evolving/moderate complexity  EVALUATION COMPLEXITY: Moderate   GOALS: Goals reviewed with patient? Yes  SHORT TERM GOALS: Target date: 09/21/2023  Pt will be independent with HEP to improve strength and decrease knee pain to improve pain-free function at home and work. Baseline: 08/31/23: Home health exercises reviewed; HEP updated and new handout given to pt.   10/03/23: Pt is compliant with HEP and verbalizes understanding of exercises given.  Goal status: ACHIEVED  By 4 weeks pt will perform TUG in less than 14 seconds indicative of improved home mobility and reduced fall risk Baseline: 08/31/23: 15.02 per home health baseline   10/03/23: 9.4 seconds.  Goal status: ACHIEVED   LONG TERM GOALS: Target date: 10/26/2023  Pt will increase FOTO to at least 58 to demonstrate significant  improvement in function at home and work related to knee pain  Baseline: 08/31/23: 41   10/03/23: 63/58 Goal status: ACHIEVED  2.  Pt will decrease worst knee pain no more than 2-3/10 on the NPRS in order to demonstrate clinically significant reduction in knee pain. Baseline: 08/31/23: 10/10 at worst.   10/03/23: 9/10 at worst (mainly  hurting along region of infection, mid-length of incision) Goal status: NOT MET   3.  Pt will demonstrate safe reciprocal negotiation of flight of stairs without LOB or buckling of post-op knee as needed for accessing second level of home to complete her workout routine (Bowflex on 2nd level) Baseline: 08/31/23: Patient requires cueing and demo for negotiation of 4 steps with FWW.    10/03/23: pt able to negotiate 4-step staircase in gym x 3 with single handrail use and no buckling or pain Goal status: MOSTLY MET   4.  Pt will increase strength of R quadriceps and tested hip musculature to 4+/5 MMT grade or greater in order to demonstrate improvement in strength and function  Baseline: 08/31/23: Quads and hip flexors 4-/5.   10/03/23: met for all tested musculature (see chart above) Goal status: ACHIEVED  5.  Pt will ambulate 660 feet or greater with no AD and no increase in knee pain without significant gait deviation or decreased stance time as needed for independent community-level ambulation  Baseline: 08/31/23: Pt ambulates at limited household distance with FWW.   10/03/23: Ambulated > 660 feet with no buckling and no increase in knee pain.  Goal status: ACHIEVED   PLAN: PT FREQUENCY: 1-2x/week  PT DURATION: 4 weeks  PLANNED INTERVENTIONS: Therapeutic exercises, Therapeutic activity, Neuromuscular re-education, Balance training, Gait training, Patient/Family education, Self Care, Joint mobilization, Electrical stimulation, Cryotherapy, Moist heat, Manual therapy, and Re-evaluation.  PLAN FOR NEXT SESSION: Continue with advanced-phase rehab with focus  on progressive standing/stepping activity, stair negotiation, CKC strengthening. Complete outdoor/uneven terrain tasks as able. Monitor for infection along operative incision and refer out if needed.     Consuela Mimes, PT, DPT #[id number]  Gertie Exon, PT, DPT  10/16/2023, 9:06 AM

## 2023-10-25 NOTE — Therapy (Unsigned)
OUTPATIENT PHYSICAL THERAPY TREATMENT    Patient Name: Andrea Zhang MRN: [id number] DOB:20-May-1961, 62 y.o., female Today's Date: 10/26/2023  END OF SESSION:  PT End of Session - 10/26/23 0858     Visit Number 13    Number of Visits 17    Date for PT Re-Evaluation 10/26/23    Authorization Type [employer]    PT Start Time 0902    PT Stop Time 0945    PT Time Calculation (min) 43 min    Activity Tolerance Patient tolerated treatment well;No increased pain                 Past Medical History:  Diagnosis Date   Anemia    iron deficiency and b12 deficiency   Anxiety    Aortic atherosclerosis (HCC)    Arthritis    Asthma    Diabetes mellitus without complication (HCC)    Fasciitis    left foot   GERD (gastroesophageal reflux disease)    History of kidney stones    Hypercholesterolemia    Hypertension    Migraines    MIGRAINES HAVE IMPROVED SINCE RECEIVING IRON   Pneumonia    Primary osteoarthritis of right knee    Tachycardia    Past Surgical History:  Procedure Laterality Date   CHOLECYSTECTOMY  2004   COLONOSCOPY WITH ESOPHAGOGASTRODUODENOSCOPY (EGD)  02/2018   ESOPHAGOGASTRODUODENOSCOPY N/A 10/16/2020   Procedure: ESOPHAGOGASTRODUODENOSCOPY (EGD);  Surgeon: Regis Bill, MD;  Location: [facility] ENDOSCOPY;  Service: Endoscopy;  Laterality: N/A;   ESOPHAGOGASTRODUODENOSCOPY (EGD) WITH PROPOFOL N/A 06/06/2016   Procedure: ESOPHAGOGASTRODUODENOSCOPY (EGD) WITH PROPOFOL;  Surgeon: Scot Jun, MD;  Location: [facility] ENDOSCOPY;  Service: Endoscopy;  Laterality: N/A;   EXTRACORPOREAL SHOCK WAVE LITHOTRIPSY  2010   JOINT REPLACEMENT  2013   LT TKR   SAVORY DILATION N/A 06/06/2016   Procedure: SAVORY DILATION;  Surgeon: Scot Jun, MD;  Location: [facility] ENDOSCOPY;  Service: Endoscopy;  Laterality: N/A;   SAVORY DILATION  02/2018   SHOULDER ARTHROSCOPY WITH OPEN ROTATOR CUFF REPAIR Right 05/24/2018   Procedure: right shoulder arthroscopy, extensive  arthroscopic debridement, decompression, open rotator cuff repair, biceps tenodesis;  Surgeon: Christena Flake, MD;  Location: [facility] ORS;  Service: Orthopedics;  Laterality: Right;   TONSILLECTOMY  1978   TOTAL KNEE ARTHROPLASTY Right 08/15/2023   Procedure: TOTAL KNEE ARTHROPLASTY;  Surgeon: Christena Flake, MD;  Location: [facility] ORS;  Service: Orthopedics;  Laterality: Right;   Patient Active Problem List   Diagnosis Date Noted   Status post total knee replacement using cement, right 08/15/2023   Coronary artery calcification 09/09/2019   Aortic atherosclerosis (HCC) 09/09/2019   Allergic rhinitis 08/06/2019   Asthma 08/06/2019   Diabetes mellitus, type 2 (HCC) 08/06/2019   Dysphagia 08/06/2019   GERD (gastroesophageal reflux disease) 08/06/2019   Hypercalcemia 08/06/2019   Hypertension 08/06/2019   Migraines 08/06/2019   Osteoarthritis 08/06/2019   Folate deficiency 03/03/2019   Morbid obesity with BMI of 40.0-44.9, adult (HCC) 06/05/2018   Status post right rotator cuff repair 06/05/2018   Degenerative joint disease of right shoulder 05/24/2018   Degenerative tear of glenoid labrum, right 05/24/2018   Injury of tendon of long head of right biceps 05/24/2018   Nontraumatic complete tear of right rotator cuff 05/14/2018   Rotator cuff tendinitis, right 05/14/2018   Iron deficiency anemia 01/15/2018   B12 deficiency 09/07/2017   Microalbuminuric diabetic nephropathy (HCC) 09/07/2017   Primary osteoarthritis of right knee 02/19/2015   Restless  leg syndrome 10/25/2012   Hyperlipidemia with target LDL less than 100 05/20/2012    PCP: Myrene Buddy, NP  REFERRING PROVIDER: Dedra Skeens, PA-C  REFERRING DIAG: [phone] (ICD-10-CM) - Presence of right artificial knee joint   RATIONALE FOR EVALUATION AND TREATMENT: Rehabilitation  THERAPY DIAG: Acute pain of right knee  Stiffness of right knee, not elsewhere classified  Difficulty in walking, not elsewhere classified  Muscle  weakness (generalized)  ONSET DATE: R TKA 08/15/23  FOLLOW-UP APPT SCHEDULED WITH REFERRING PROVIDER: Not yet scheduled, staples removed yesterday   PERTINENT HISTORY: Pt is a 62 year old female s/p R TKA 08/15/23. Hx of L TKA 11 years ago. Patient reports notable pain with staples in incision and attempting to bend knee - they were removed yesterday. Pt reports wearing shoes for first time Tuesday night and had notable pain from bunion in L foot. She is wearing thicker sock on L foot now and this seems to help. Patient reports using wide size shoes to accommodate her bunion. Patient has completed home health therapy. Per home health paperwork, SPPB 10/12, TUG 15.02 sec.   PAIN:   Pain Intensity: Present: 3/10, Best: 0/10, Worst: 10/10 Pain location: Posterior knee/popliteal region Pain quality: aching  Radiating pain: Yes , feels bruised into shin  Swelling: Yes   Numbness/Tingling: No Aggravating factors: bending knee, prolonged sitting Relieving factors: ice/cold compression machine,   History of prior back, hip, or knee injury, pain, surgery, or therapy: Yes; hx of bilateral bunion that is sypmtomatic   Imaging: Yes ;  CLINICAL DATA:  Postop knee replacement. DG 08/15/23   EXAM: PORTABLE RIGHT KNEE - 1-2 VIEW   COMPARISON:  None Available.   FINDINGS: Right knee arthroplasty in expected alignment. No periprosthetic lucency or fracture. There has been patellar resurfacing. Recent postsurgical change includes air and edema in the soft tissues and joint space. Anterior skin staples in place.   IMPRESSION: Right knee arthroplasty without immediate postoperative complication.   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 No  Living Environment Lives with: lives with their spouse; her son stayed with pt first week after surgery. Husband is back to work. Pt is at home alone during work week at this time. Pt has sister who is hemiplegic who previously stayed with her.  Lives in: House/apartment Ramped entry, grab bar installed. Step-in shower. Bench in shower. Pt uses raised commode seat to sit down and get dressed. Concrete driveway to enter home.   Patient Goals: No assistive device, able to sit at her desk for work at her computer.    OBJECTIVE:     Gross Musculoskeletal Assessment Tremor: None Bulk: Atrophy of R>L quadriceps Tone: Normal No sign of acute infection   GAIT: Distance walked: 30 ft Assistive device utilized: Walker - 2 wheeled Level of assistance: SBA Comments: Dec terminal knee extension, dec heel strike, dec step length R, dec stance time R/antalgic pattern    AROM AROM (Normal range in degrees) AROM 08/31/23 AROM 10/03/23  Hip Right 08/31/23 Left 08/31/23 Right 10/03/23 Left 10/03/23  Flexion (125)      Extension (15)      Abduction (40)      Adduction  Internal Rotation (45)      External Rotation (45)            Knee      Flexion (135) 101 135 125 WNL  Extension (0) 0 +3 0 WNL        Ankle      Dorsiflexion (20) WNL WNL    Plantarflexion (50) WNL WNL    Inversion (35)      Eversion (15      (* = pain; Blank rows = not tested)  LE MMT: MMT (out of 5) Right 08/31/23 Left 08/31/23 Right 10/03/23 Left 10/03/23  Hip flexion 4- 4 4+ 5-  Hip extension      Hip abduction (seated) 5 5 5 5   Hip adduction (seated) 5 5 5 5   Hip internal rotation      Hip external rotation      Knee flexion 5 5 5 5   Knee extension 4- 5 4+ 5  Ankle dorsiflexion      Ankle plantarflexion      Ankle inversion      Ankle eversion      (* = pain; Blank rows = not  tested)  *Pt demonstrates SLR with no extensor lag   Sensation Deferred  Reflexes Deferred  Muscle Length Hamstrings: R: Positive L: Negative Quadriceps ([place]): R: Not examined L: Not examined     TODAY'S TREATMENT: 10/26/23    SUBJECTIVE STATEMENT:   Patient had negative CT angiogram for any acute pulmonary condition/PE. Pt does have other findings, including possible thyroid nodule, which will be further investigated. Pt reports no significant R knee pain at arrival. She reports more pain in R knee with trying to bend it or relax with knee extended in evening. She has pain with sitting in rolling office chair with her feet/toes resting on chair legs.    *Incision is generally well healed, one small region with scab around 2 mm x 2 mm along superior 1/3 of incision. No drainage. No open wound along incision at this time.    Manual Therapy - for symptom modulation, soft tissue sensitivity and mobility, joint mobility, ROM  R knee ROM within pt tolerance; x 10 reps  -full range noted without restriction Tibiofemoral mobilizations, A-P; gr I-II for pain control; 2 x 30 sec bouts Scar massage; x 3 minutes along superior portion of incision  Patellar mobilization;  2 x 30 sec bouts with emphasis on alternating superior/inferior glide of patella   Therapeutic Exercise - for knee ROM, strengthening to improve ability to perform closed-chain functional movements e.g. transfers, squatting, stair negotiation    SLR; 2x10, 4# AW  Sit to stand with Goblet hold, 6.6-lb medball; 2x10  -pause for intermittent lightheadedness  Hurdle stepping in // bars: 3 consecutive 6-inch hurdles; 5x D/B length of bars  Forward step up; 6-inch step, unilateral LUE support; 1x10 -for HEP review  Lateral step-up; 6-inch step, unilateral LUE support; 1x10  -for HEP review   PATIENT EDUCATION:  Discussed one remaining visit for this calendar year, and extending plan into January (once insurance  re-starts and visit count for the year re-starts) as needed if pt is not ready to finish plan of care next week.     *not today* NuStep level 5 (seat 5) x 5 minutes to improve L knee mobility and cardiorespiratory endurance.  Seated LAQ 4# AW 2 x 12 each LE with 3 second hold ; TRX squats 2 x 10 Dynamic march along blue agility with 3#  AW; 5x D/B Sit to stand from standard-height chair; 2x8  Standing knee flexion stretch on 2nd step x 10 with 5 second hold Low load, long duration stretch for knee extension, lower leg resting on pillow, 5-lb cuff weight; x 2 minutes Forward to retro step on blue agility ladder; 4x D/B for terminal knee extension and gait stability Standing marching 3# AW 2 x 10 each LE   Standing hip abduction 3# AW 2 x 10 each LE  Standing hamstring curls 2#AW 2 x 10 each LE  Seated hamstring stretch 3 x 30 seconds     PATIENT EDUCATION:  Education details: see above for patient education details Person educated: Patient Education method: Explanation, Demonstration, and Handouts Education comprehension: verbalized understanding and returned demonstration   HOME EXERCISE PROGRAM:  Access Code: [email or web address] URL: [email or web address] Date: 10/26/2023 Prepared by: Consuela Mimes  Exercises - Seated Hamstring Stretch  - 2 x daily - 7 x weekly - 3 sets - 30sec hold - Active Straight Leg Raise with Quad Set  - 2 x daily - 7 x weekly - 2 sets - 10 reps - Sit to Stand  - 1 x daily - 7 x weekly - 3 sets - 5-10 reps - Forward Step Up with Counter Support  - 1 x daily - 7 x weekly - 3 sets - 5-10 reps - Mini Squat with Counter Support  - 1 x daily - 7 x weekly - 2-3 sets - 5-10 reps - Lateral Step Up with Counter Support  - 2 x daily - 7 x weekly - 2-3 sets - 5-100 reps   ASSESSMENT:  CLINICAL IMPRESSION:    Region of infection along mid-incision is fortunately largely healed. No signs of infection noted at this time; no drainage, no erythema, no  increased tissue temperature. Incision is largely closed with only one very small scab along superior 1/3 of incision. Pt has excellent R knee ROM. She is progressing well with open-chain strengthening. Pt is challenged with intermittent episodes of postural dizziness for which she is continuing follow-up with MD. Pt has final visit scheduled on 12/24 and may consider discharge at that time pending update of long-term goals # 2 and #3. Pt is approaching readiness for discharge at this time. Pt will continue to benefit from continued skilled PT services to address deficits and improve function.   OBJECTIVE IMPAIRMENTS: Abnormal gait, decreased balance, decreased mobility, difficulty walking, decreased ROM, decreased strength, increased edema, impaired flexibility, and pain.   ACTIVITY LIMITATIONS: carrying, lifting, bending, sitting, squatting, stairs, transfers, bed mobility, and locomotion level  PARTICIPATION LIMITATIONS: meal prep, cleaning, laundry, driving, shopping, community activity, and occupation  PERSONAL FACTORS: Past/current experiences, complicated orthopedic history, and 3+ comorbidities: (Type 2 DM, diabetic nephropathy, anxiety, anemia, HTN, and aortic atherosclerosis) are also affecting patient's functional outcome.   REHAB POTENTIAL: Good  CLINICAL DECISION MAKING: Evolving/moderate complexity  EVALUATION COMPLEXITY: Moderate   GOALS: Goals reviewed with patient? Yes  SHORT TERM GOALS: Target date: 09/21/2023  Pt will be independent with HEP to improve strength and decrease knee pain to improve pain-free function at home and work. Baseline: 08/31/23: Home health exercises reviewed; HEP updated and new handout given to pt.   10/03/23: Pt is compliant with HEP and verbalizes understanding of exercises given.  Goal status: ACHIEVED  By 4 weeks pt will perform TUG in less than 14 seconds indicative of improved home mobility and reduced fall risk Baseline: 08/31/23: 15.02  per home health baseline   10/03/23: 9.4  seconds.  Goal status: ACHIEVED   LONG TERM GOALS: Target date: 10/26/2023  Pt will increase FOTO to at least 58 to demonstrate significant improvement in function at home and work related to knee pain  Baseline: 08/31/23: 41   10/03/23: 63/58 Goal status: ACHIEVED  2.  Pt will decrease worst knee pain no more than 2-3/10 on the NPRS in order to demonstrate clinically significant reduction in knee pain. Baseline: 08/31/23: 10/10 at worst.   10/03/23: 9/10 at worst (mainly hurting along region of infection, mid-length of incision) Goal status: NOT MET   3.  Pt will demonstrate safe reciprocal negotiation of flight of stairs without LOB or buckling of post-op knee as needed for accessing second level of home to complete her workout routine (Bowflex on 2nd level) Baseline: 08/31/23: Patient requires cueing and demo for negotiation of 4 steps with FWW.    10/03/23: pt able to negotiate 4-step staircase in gym x 3 with single handrail use and no buckling or pain Goal status: MOSTLY MET   4.  Pt will increase strength of R quadriceps and tested hip musculature to 4+/5 MMT grade or greater in order to demonstrate improvement in strength and function  Baseline: 08/31/23: Quads and hip flexors 4-/5.   10/03/23: met for all tested musculature (see chart above) Goal status: ACHIEVED  5.  Pt will ambulate 660 feet or greater with no AD and no increase in knee pain without significant gait deviation or decreased stance time as needed for independent community-level ambulation  Baseline: 08/31/23: Pt ambulates at limited household distance with FWW.   10/03/23: Ambulated > 660 feet with no buckling and no increase in knee pain.  Goal status: ACHIEVED   PLAN: PT FREQUENCY: 1-2x/week  PT DURATION: 4 weeks  PLANNED INTERVENTIONS: Therapeutic exercises, Therapeutic activity, Neuromuscular re-education, Balance training, Gait training, Patient/Family education,  Self Care, Joint mobilization, Electrical stimulation, Cryotherapy, Moist heat, Manual therapy, and Re-evaluation.  PLAN FOR NEXT SESSION: Continue with advanced-phase rehab with focus on progressive standing/stepping activity, stair negotiation, CKC strengthening. Complete outdoor/uneven terrain tasks as able. Monitor for infection along operative incision and refer out if needed.  Update remaining goals (long-term goals # 2 and # 3) next visit and determine readiness for discharge.     Consuela Mimes, PT, DPT #[id number]  Andrea Zhang, PT, DPT  10/26/2023, 10:10 AM

## 2023-10-26 ENCOUNTER — Ambulatory Visit: Payer: BC Managed Care – PPO | Admitting: Physical Therapy

## 2023-10-26 DIAGNOSIS — R262 Difficulty in walking, not elsewhere classified: Secondary | ICD-10-CM

## 2023-10-26 DIAGNOSIS — M25561 Pain in right knee: Secondary | ICD-10-CM

## 2023-10-26 DIAGNOSIS — M6281 Muscle weakness (generalized): Secondary | ICD-10-CM

## 2023-10-26 DIAGNOSIS — M25661 Stiffness of right knee, not elsewhere classified: Secondary | ICD-10-CM

## 2023-10-31 ENCOUNTER — Ambulatory Visit: Payer: BC Managed Care – PPO

## 2023-10-31 DIAGNOSIS — M25561 Pain in right knee: Secondary | ICD-10-CM | POA: Diagnosis not present

## 2023-10-31 DIAGNOSIS — M25661 Stiffness of right knee, not elsewhere classified: Secondary | ICD-10-CM

## 2023-10-31 DIAGNOSIS — M6281 Muscle weakness (generalized): Secondary | ICD-10-CM

## 2023-10-31 DIAGNOSIS — R262 Difficulty in walking, not elsewhere classified: Secondary | ICD-10-CM

## 2023-10-31 NOTE — Therapy (Signed)
OUTPATIENT PHYSICAL THERAPY TREATMENT    Patient Name: Andrea Zhang MRN: [id number] DOB:[birth year], 62 y.o., female Today's Date: 10/31/2023  END OF SESSION:  PT End of Session - 10/31/23 1054     Visit Number 14    Number of Visits 17    Date for PT Re-Evaluation 10/26/23    Authorization Type [employer]    PT Start Time 0932    PT Stop Time 1015    PT Time Calculation (min) 43 min    Equipment Utilized During Treatment Gait belt    Activity Tolerance Patient tolerated treatment well    Behavior During Therapy WFL for tasks assessed/performed                  Past Medical History:  Diagnosis Date   Anemia    iron deficiency and b12 deficiency   Anxiety    Aortic atherosclerosis (HCC)    Arthritis    Asthma    Diabetes mellitus without complication (HCC)    Fasciitis    left foot   GERD (gastroesophageal reflux disease)    History of kidney stones    Hypercholesterolemia    Hypertension    Migraines    MIGRAINES HAVE IMPROVED SINCE RECEIVING IRON   Pneumonia    Primary osteoarthritis of right knee    Tachycardia    Past Surgical History:  Procedure Laterality Date   CHOLECYSTECTOMY  2004   COLONOSCOPY WITH ESOPHAGOGASTRODUODENOSCOPY (EGD)  02/2018   ESOPHAGOGASTRODUODENOSCOPY N/A 10/16/2020   Procedure: ESOPHAGOGASTRODUODENOSCOPY (EGD);  Surgeon: Regis Bill, MD;  Location: [facility] ENDOSCOPY;  Service: Endoscopy;  Laterality: N/A;   ESOPHAGOGASTRODUODENOSCOPY (EGD) WITH PROPOFOL N/A 06/06/2016   Procedure: ESOPHAGOGASTRODUODENOSCOPY (EGD) WITH PROPOFOL;  Surgeon: Scot Jun, MD;  Location: [facility] ENDOSCOPY;  Service: Endoscopy;  Laterality: N/A;   EXTRACORPOREAL SHOCK WAVE LITHOTRIPSY  2010   JOINT REPLACEMENT  2013   LT TKR   SAVORY DILATION N/A 06/06/2016   Procedure: SAVORY DILATION;  Surgeon: Scot Jun, MD;  Location: [facility] ENDOSCOPY;  Service: Endoscopy;  Laterality: N/A;   SAVORY DILATION  02/2018   SHOULDER ARTHROSCOPY  WITH OPEN ROTATOR CUFF REPAIR Right 05/24/2018   Procedure: right shoulder arthroscopy, extensive arthroscopic debridement, decompression, open rotator cuff repair, biceps tenodesis;  Surgeon: Christena Flake, MD;  Location: [facility] ORS;  Service: Orthopedics;  Laterality: Right;   TONSILLECTOMY  1978   TOTAL KNEE ARTHROPLASTY Right 08/15/2023   Procedure: TOTAL KNEE ARTHROPLASTY;  Surgeon: Christena Flake, MD;  Location: [facility] ORS;  Service: Orthopedics;  Laterality: Right;   Patient Active Problem List   Diagnosis Date Noted   Status post total knee replacement using cement, right 08/15/2023   Coronary artery calcification 09/09/2019   Aortic atherosclerosis (HCC) 09/09/2019   Allergic rhinitis 08/06/2019   Asthma 08/06/2019   Diabetes mellitus, type 2 (HCC) 08/06/2019   Dysphagia 08/06/2019   GERD (gastroesophageal reflux disease) 08/06/2019   Hypercalcemia 08/06/2019   Hypertension 08/06/2019   Migraines 08/06/2019   Osteoarthritis 08/06/2019   Folate deficiency 03/03/2019   Morbid obesity with BMI of 40.0-44.9, adult (HCC) 06/05/2018   Status post right rotator cuff repair 06/05/2018   Degenerative joint disease of right shoulder 05/24/2018   Degenerative tear of glenoid labrum, right 05/24/2018   Injury of tendon of long head of right biceps 05/24/2018   Nontraumatic complete tear of right rotator cuff 05/14/2018   Rotator cuff tendinitis, right 05/14/2018   Iron deficiency anemia 01/15/2018   B12 deficiency 09/07/2017  Microalbuminuric diabetic nephropathy (HCC) 09/07/2017   Primary osteoarthritis of right knee 02/19/2015   Restless leg syndrome 10/25/2012   Hyperlipidemia with target LDL less than 100 05/20/2012    PCP: Myrene Buddy, NP  REFERRING PROVIDER: Dedra Skeens, PA-C  REFERRING DIAG: [phone] (ICD-10-CM) - Presence of right artificial knee joint   RATIONALE FOR EVALUATION AND TREATMENT: Rehabilitation  THERAPY DIAG: Difficulty in walking, not elsewhere  classified  Muscle weakness (generalized)  Stiffness of right knee, not elsewhere classified  ONSET DATE: R TKA 08/15/23  FOLLOW-UP APPT SCHEDULED WITH REFERRING PROVIDER: Not yet scheduled, staples removed yesterday   PERTINENT HISTORY: Pt is a 62 year old female s/p R TKA 08/15/23. Hx of L TKA 11 years ago. Patient reports notable pain with staples in incision and attempting to bend knee - they were removed yesterday. Pt reports wearing shoes for first time Tuesday night and had notable pain from bunion in L foot. She is wearing thicker sock on L foot now and this seems to help. Patient reports using wide size shoes to accommodate her bunion. Patient has completed home health therapy. Per home health paperwork, SPPB 10/12, TUG 15.02 sec.   PAIN:   Pain Intensity: Present: 3/10, Best: 0/10, Worst: 10/10 Pain location: Posterior knee/popliteal region Pain quality: aching  Radiating pain: Yes , feels bruised into shin  Swelling: Yes   Numbness/Tingling: No Aggravating factors: bending knee, prolonged sitting Relieving factors: ice/cold compression machine,   History of prior back, hip, or knee injury, pain, surgery, or therapy: Yes; hx of bilateral bunion that is sypmtomatic   Imaging: Yes ;  CLINICAL DATA:  Postop knee replacement. DG 08/15/23   EXAM: PORTABLE RIGHT KNEE - 1-2 VIEW   COMPARISON:  None Available.   FINDINGS: Right knee arthroplasty in expected alignment. No periprosthetic lucency or fracture. There has been patellar resurfacing. Recent postsurgical change includes air and edema in the soft tissues and joint space. Anterior skin staples in place.   IMPRESSION: Right knee arthroplasty without immediate postoperative complication.   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 No  Living Environment Lives with: lives with their spouse; her son stayed with pt first week after surgery. Husband is back to work. Pt is at home alone during work week at this time. Pt has sister who is hemiplegic who previously stayed with her.  Lives in: House/apartment Ramped entry, grab bar installed. Step-in shower. Bench in shower. Pt uses raised commode seat to sit down and get dressed. Concrete driveway to enter home.   Patient Goals: No assistive device, able to sit at her desk for work at her computer.    OBJECTIVE:     Gross Musculoskeletal Assessment Tremor: None Bulk: Atrophy of R>L quadriceps Tone: Normal No sign of acute infection   GAIT: Distance walked: 30 ft Assistive device utilized: Walker - 2 wheeled Level of assistance: SBA Comments: Dec terminal knee extension, dec heel strike, dec step length R, dec stance time R/antalgic pattern    AROM AROM (Normal range in degrees) AROM 08/31/23 AROM 10/03/23  Hip Right 08/31/23 Left 08/31/23 Right 10/03/23 Left 10/03/23  Flexion (125)      Extension (15)  Abduction (40)      Adduction       Internal Rotation (45)      External Rotation (45)            Knee      Flexion (135) 101 135 125 WNL  Extension (0) 0 +3 0 WNL        Ankle      Dorsiflexion (20) WNL WNL    Plantarflexion (50) WNL WNL    Inversion (35)      Eversion (15      (* = pain; Blank rows = not tested)  LE MMT: MMT (out of 5) Right 08/31/23 Left 08/31/23 Right 10/03/23 Left 10/03/23  Hip flexion 4- 4 4+ 5-  Hip extension      Hip abduction (seated) 5 5 5 5   Hip adduction (seated) 5 5 5 5   Hip internal rotation      Hip external rotation      Knee flexion 5 5 5 5   Knee extension 4- 5 4+ 5  Ankle dorsiflexion      Ankle plantarflexion      Ankle  inversion      Ankle eversion      (* = pain; Blank rows = not tested)  *Pt demonstrates SLR with no extensor lag   Sensation Deferred  Reflexes Deferred  Muscle Length Hamstrings: R: Positive L: Negative Quadriceps ([place]): R: Not examined L: Not examined     TODAY'S TREATMENT: 10/31/23    SUBJECTIVE STATEMENT:   Pt stated on 10/31/2023: " I don't know where the dizziness is coming form. No clots anywhere, my lab is great. 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 I get dizzy with exertion."  Patient had negative CT angiogram for any acute pulmonary condition/PE. Pt does have other findings, including possible thyroid nodule, which will be further investigated. Pt reports no significant R knee pain at arrival. She reports more pain in R knee with trying to bend it or relax with knee extended in evening. She has pain with sitting in rolling office chair with her feet/toes resting on chair legs.  10/31/2023: " I am feeling better. Have pain in my outside part of the knee when I sit for long time and when I stand up and walk. The small opening on my scar looks much better now."   *Incision is generally well healed, one small region with scab around 2 mm x 2 mm along superior 1/3 of incision. No drainage. No open wound along incision at this time.    Manual Therapy - for symptom modulation, soft tissue sensitivity and mobility, joint mobility, ROM  R knee ROM within pt tolerance; x 10 reps  -full range noted without restriction Tibiofemoral mobilizations, A-P; gr I-II for pain control; 2 x 30 sec bouts Scar massage; x 3 minutes along superior portion of incision  Patellar mobilization;  2 x 30 sec bouts with emphasis on alternating superior/inferior glide of patella   Therapeutic Exercise - for knee ROM, strengthening to improve ability to perform closed-chain functional movements e.g. transfers, squatting, stair negotiation  Knee ROM 0- 115 without pain and soft end feel.    Supine TKE #5 3 x 10 reps Supine SLR; 2x15, 5# AW Side lying Hip ABD #4 4 x 6 reps  Standing Hip ext #4 4 x 6 reps Bridges with BTB 2 x 20 rpes STS with BTB  Neuromuscular Re-Ed Monster walk with BTB Side stepping with BTB Backward stepping  with BTB   Not to day:  Sit to stand with Goblet hold, 6.6-lb medball; 2x10  -pause for intermittent lightheadedness  Hurdle stepping in // bars: 3 consecutive 6-inch hurdles; 5x D/B length of bars  Forward step up; 6-inch step, unilateral LUE support; 1x10 -for HEP review  Lateral step-up; 6-inch step, unilateral LUE support; 1x10  -for HEP review   PATIENT EDUCATION:  Discussed one remaining visit for this calendar year, and extending plan into January (once insurance re-starts and visit count for the year re-starts) as needed if pt is not ready to finish plan of care next week.     *not today* NuStep level 5 (seat 5) x 5 minutes to improve L knee mobility and cardiorespiratory endurance.  Seated LAQ 4# AW 2 x 12 each LE with 3 second hold ; TRX squats 2 x 10 Dynamic march along blue agility with 3# AW; 5x D/B Sit to stand from standard-height chair; 2x8  Standing knee flexion stretch on 2nd step x 10 with 5 second hold Low load, long duration stretch for knee extension, lower leg resting on pillow, 5-lb cuff weight; x 2 minutes Forward to retro step on blue agility ladder; 4x D/B for terminal knee extension and gait stability Standing marching 3# AW 2 x 10 each LE   Standing hip abduction 3# AW 2 x 10 each LE  Standing hamstring curls 2#AW 2 x 10 each LE  Seated hamstring stretch 3 x 30 seconds     PATIENT EDUCATION:  Education details: see above for patient education details Person educated: Patient Education method: Explanation, Demonstration, and Handouts Education comprehension: verbalized understanding and returned demonstration   HOME EXERCISE PROGRAM:  Access Code: [email or web address] URL:  [email or web address] Date: 10/26/2023 Prepared by: Consuela Mimes  Exercises - Seated Hamstring Stretch  - 2 x daily - 7 x weekly - 3 sets - 30sec hold - Active Straight Leg Raise with Quad Set  - 2 x daily - 7 x weekly - 2 sets - 10 reps - Sit to Stand  - 1 x daily - 7 x weekly - 3 sets - 5-10 reps - Forward Step Up with Counter Support  - 1 x daily - 7 x weekly - 3 sets - 5-10 reps - Mini Squat with Counter Support  - 1 x daily - 7 x weekly - 2-3 sets - 5-10 reps - Lateral Step Up with Counter Support  - 2 x daily - 7 x weekly - 2-3 sets - 5-100 reps   ASSESSMENT:  CLINICAL IMPRESSION:    10/31/2023: PT examined IT band and Bakers Cyst. BP 120/82 prior after sci-fit HR 85BPM.  Pt has met the remainder of the two goals of strength and walking without AD. Positive tenderness with palpation to IT band insertion site. PT introduced pt to IT band stretch. Pt has dizziness with Change in position and with exertion. PT continued with strengthening stretching and dynamic neuromotor control  activities to improve pt balance, stability and strength.  Pt demonstrated no LOB. Pt has mild dizziness with exertion which subsides with rest. Pt advised to see her PCP for further cardiac work up, hydration, thyroid mass and Migraine pills to prevent falls 2/2 to dizziness. Pt tol tx well. Pt has met all her goal. FOTO not assessed 2/2 to no Staff available to provide the test. Pt is discharged from therapy. Pt agrees.   Region of infection along mid-incision is fortunately largely healed. No signs of infection noted at this time;  no drainage, no erythema, no increased tissue temperature. Incision is largely closed with only one very small scab along superior 1/3 of incision. Pt has excellent R knee ROM. She is progressing well with open-chain strengthening. Pt is challenged with intermittent episodes of postural dizziness for which she is continuing follow-up with MD. Pt has final visit scheduled  on 12/24 and may consider discharge at that time pending update of long-term goals # 2 and #3. Pt is approaching readiness for discharge at this time. Pt will continue to benefit from continued skilled PT services to address deficits and improve function.   OBJECTIVE IMPAIRMENTS: Abnormal gait, decreased balance, decreased mobility, difficulty walking, decreased ROM, decreased strength, increased edema, impaired flexibility, and pain.   ACTIVITY LIMITATIONS: carrying, lifting, bending, sitting, squatting, stairs, transfers, bed mobility, and locomotion level  PARTICIPATION LIMITATIONS: meal prep, cleaning, laundry, driving, shopping, community activity, and occupation  PERSONAL FACTORS: Past/current experiences, complicated orthopedic history, and 3+ comorbidities: (Type 2 DM, diabetic nephropathy, anxiety, anemia, HTN, and aortic atherosclerosis) are also affecting patient's functional outcome.   REHAB POTENTIAL: Good  CLINICAL DECISION MAKING: Evolving/moderate complexity  EVALUATION COMPLEXITY: Moderate   GOALS: Goals reviewed with patient? Yes  SHORT TERM GOALS: Target date: 09/21/2023  Pt will be independent with HEP to improve strength and decrease knee pain to improve pain-free function at home and work. Baseline: 08/31/23: Home health exercises reviewed; HEP updated and new handout given to pt.   10/03/23: Pt is compliant with HEP and verbalizes understanding of exercises given.  Goal status: ACHIEVED  By 4 weeks pt will perform TUG in less than 14 seconds indicative of improved home mobility and reduced fall risk Baseline: 08/31/23: 15.02 per home health baseline   10/03/23: 9.4 seconds.  Goal status: ACHIEVED   LONG TERM GOALS: Target date: 10/26/2023  Pt will increase FOTO to at least 58 to demonstrate significant improvement in function at home and work related to knee pain  Baseline: 08/31/23: 41   10/03/23: 63/58 Goal status: N/A   2.  Pt will decrease worst knee  pain no more than 2-3/10 on the NPRS in order to demonstrate clinically significant reduction in knee pain. Baseline: 08/31/23: 10/10 at worst.   10/03/23: 9/10 at worst (mainly hurting along region of infection, mid-length of incision) Goal status: Met    3.  Pt will demonstrate safe reciprocal negotiation of flight of stairs without LOB or buckling of post-op knee as needed for accessing second level of home to complete her workout routine (Bowflex on 2nd level) Baseline: 08/31/23: Patient requires cueing and demo for negotiation of 4 steps with FWW.    10/03/23: pt able to negotiate 4-step staircase in gym x 3 with single handrail use and no buckling or pain Goal status: Met ( Mild IT band tightness)   4.  Pt will increase strength of R quadriceps and tested hip musculature to 4+/5 MMT grade or greater in order to demonstrate improvement in strength and function  Baseline: 08/31/23: Quads and hip flexors 4-/5.   10/03/23: met for all tested musculature (see chart above) Goal status: Achieved 4/6 10/31/23  5.  Pt will ambulate 660 feet or greater with no AD and no increase in knee pain without significant gait deviation or decreased stance time as needed for independent community-level ambulation  Baseline: 08/31/23: Pt ambulates at limited household distance with FWW.   10/03/23: Ambulated > 660 feet with no buckling and no increase in knee pain.  Goal status: Met: Pt walked  in to the Gym from the Car and back to the car and walks at home without LOB and without AD.    PLAN: PT FREQUENCY:   PT DURATION:   PLANNED INTERVENTIONS: Therapeutic exercises, Therapeutic activity, Neuromuscular re-education, Balance training, Gait training, Patient/Family education, Self Care, Joint mobilization, Electrical stimulation, Cryotherapy, Moist heat, Manual therapy, and Re-evaluation.  PLAN FOR NEXT SESSION: Discharged  Janet Berlin PT DPT 10:57 AM,10/31/23

## 2023-11-09 ENCOUNTER — Encounter: Payer: Self-pay | Admitting: Oncology

## 2023-11-09 NOTE — Telephone Encounter (Signed)
 It was duplicate

## 2023-11-29 ENCOUNTER — Inpatient Hospital Stay: Payer: BC Managed Care – PPO

## 2023-11-29 ENCOUNTER — Encounter: Payer: Self-pay | Admitting: Oncology

## 2023-11-29 ENCOUNTER — Inpatient Hospital Stay: Payer: BC Managed Care – PPO | Attending: Oncology | Admitting: Oncology

## 2023-11-29 VITALS — BP 139/99 | HR 91 | Temp 97.8°F | Resp 17 | Wt 164.0 lb

## 2023-11-29 DIAGNOSIS — Z8042 Family history of malignant neoplasm of prostate: Secondary | ICD-10-CM | POA: Insufficient documentation

## 2023-11-29 DIAGNOSIS — M1711 Unilateral primary osteoarthritis, right knee: Secondary | ICD-10-CM | POA: Insufficient documentation

## 2023-11-29 DIAGNOSIS — Z8379 Family history of other diseases of the digestive system: Secondary | ICD-10-CM | POA: Insufficient documentation

## 2023-11-29 DIAGNOSIS — Z818 Family history of other mental and behavioral disorders: Secondary | ICD-10-CM | POA: Diagnosis not present

## 2023-11-29 DIAGNOSIS — Z820 Family history of epilepsy and other diseases of the nervous system: Secondary | ICD-10-CM | POA: Insufficient documentation

## 2023-11-29 DIAGNOSIS — J45909 Unspecified asthma, uncomplicated: Secondary | ICD-10-CM | POA: Insufficient documentation

## 2023-11-29 DIAGNOSIS — K222 Esophageal obstruction: Secondary | ICD-10-CM | POA: Diagnosis not present

## 2023-11-29 DIAGNOSIS — E119 Type 2 diabetes mellitus without complications: Secondary | ICD-10-CM | POA: Insufficient documentation

## 2023-11-29 DIAGNOSIS — Z87442 Personal history of urinary calculi: Secondary | ICD-10-CM | POA: Insufficient documentation

## 2023-11-29 DIAGNOSIS — Z9049 Acquired absence of other specified parts of digestive tract: Secondary | ICD-10-CM | POA: Diagnosis not present

## 2023-11-29 DIAGNOSIS — Z9089 Acquired absence of other organs: Secondary | ICD-10-CM | POA: Insufficient documentation

## 2023-11-29 DIAGNOSIS — Z886 Allergy status to analgesic agent status: Secondary | ICD-10-CM | POA: Diagnosis not present

## 2023-11-29 DIAGNOSIS — Z888 Allergy status to other drugs, medicaments and biological substances status: Secondary | ICD-10-CM | POA: Diagnosis not present

## 2023-11-29 DIAGNOSIS — I10 Essential (primary) hypertension: Secondary | ICD-10-CM | POA: Insufficient documentation

## 2023-11-29 DIAGNOSIS — Z823 Family history of stroke: Secondary | ICD-10-CM | POA: Diagnosis not present

## 2023-11-29 DIAGNOSIS — Z811 Family history of alcohol abuse and dependence: Secondary | ICD-10-CM | POA: Insufficient documentation

## 2023-11-29 DIAGNOSIS — Z8249 Family history of ischemic heart disease and other diseases of the circulatory system: Secondary | ICD-10-CM | POA: Insufficient documentation

## 2023-11-29 DIAGNOSIS — Z832 Family history of diseases of the blood and blood-forming organs and certain disorders involving the immune mechanism: Secondary | ICD-10-CM | POA: Insufficient documentation

## 2023-11-29 DIAGNOSIS — Z825 Family history of asthma and other chronic lower respiratory diseases: Secondary | ICD-10-CM | POA: Insufficient documentation

## 2023-11-29 DIAGNOSIS — Z79899 Other long term (current) drug therapy: Secondary | ICD-10-CM | POA: Diagnosis not present

## 2023-11-29 DIAGNOSIS — E785 Hyperlipidemia, unspecified: Secondary | ICD-10-CM | POA: Diagnosis not present

## 2023-11-29 DIAGNOSIS — E538 Deficiency of other specified B group vitamins: Secondary | ICD-10-CM | POA: Diagnosis not present

## 2023-11-29 DIAGNOSIS — D509 Iron deficiency anemia, unspecified: Secondary | ICD-10-CM | POA: Insufficient documentation

## 2023-11-29 DIAGNOSIS — Z8701 Personal history of pneumonia (recurrent): Secondary | ICD-10-CM | POA: Insufficient documentation

## 2023-11-29 DIAGNOSIS — R0602 Shortness of breath: Secondary | ICD-10-CM | POA: Diagnosis not present

## 2023-11-29 DIAGNOSIS — Z881 Allergy status to other antibiotic agents status: Secondary | ICD-10-CM | POA: Diagnosis not present

## 2023-11-29 LAB — CBC WITH DIFFERENTIAL/PLATELET
Abs Immature Granulocytes: 0.05 10*3/uL (ref 0.00–0.07)
Basophils Absolute: 0.1 10*3/uL (ref 0.0–0.1)
Basophils Relative: 1 %
Eosinophils Absolute: 0.2 10*3/uL (ref 0.0–0.5)
Eosinophils Relative: 2 %
HCT: 46.4 % — ABNORMAL HIGH (ref 36.0–46.0)
Hemoglobin: 15.1 g/dL — ABNORMAL HIGH (ref 12.0–15.0)
Immature Granulocytes: 1 %
Lymphocytes Relative: 18 %
Lymphs Abs: 1.6 10*3/uL (ref 0.7–4.0)
MCH: 29.2 pg (ref 26.0–34.0)
MCHC: 32.5 g/dL (ref 30.0–36.0)
MCV: 89.6 fL (ref 80.0–100.0)
Monocytes Absolute: 0.9 10*3/uL (ref 0.1–1.0)
Monocytes Relative: 10 %
Neutro Abs: 6.3 10*3/uL (ref 1.7–7.7)
Neutrophils Relative %: 68 %
Platelets: 368 10*3/uL (ref 150–400)
RBC: 5.18 MIL/uL — ABNORMAL HIGH (ref 3.87–5.11)
RDW: 12.5 % (ref 11.5–15.5)
WBC: 9 10*3/uL (ref 4.0–10.5)
nRBC: 0 % (ref 0.0–0.2)

## 2023-11-29 LAB — IRON AND TIBC
Iron: 61 ug/dL (ref 28–170)
Saturation Ratios: 19 % (ref 10.4–31.8)
TIBC: 330 ug/dL (ref 250–450)
UIBC: 269 ug/dL

## 2023-11-29 LAB — FERRITIN: Ferritin: 171 ng/mL (ref 11–307)

## 2023-11-29 NOTE — Progress Notes (Signed)
I connected with Andrea Zhang on 11/29/23 at  1:00 PM EST by video enabled telemedicine visit and verified that I am speaking with the correct person using two identifiers.   I discussed the limitations, risks, security and privacy concerns of performing an evaluation and management service by telemedicine and the availability of in-person appointments. I also discussed with the patient that there may be a patient responsible charge related to this service. The patient expressed understanding and agreed to proceed.  Other persons participating in the visit and their role in the encounter:  cancer center  Patient's location:  cancer center Provider's location:  home  Chief Complaint: Routine follow-up of iron deficiency anemia  History of present illness: patient is a 63 year old female with a past medical history significant for hypertension hyperlipidemia, asthma diabetes osteoarthritis among other medical problems.  She also has chronic arthritis for which she sees Dr. Gavin Potters and has been getting joint injections.  She was seen by her PCP on 01/12/2018 with symptoms of lightheadedness and worsening shortness of breath.  She has been earlier seen by Dr. Lady Gary from cardiology on 12/19/2017 and underwent stress test and echocardiogram that was normal.  Blood work done on 01/12/2018 was as follows: CBC showed white count of 8.4, H&H of 6.7/22.6 with an MCV of 81 and a platelet count of 324.  Differential on the CBC was normal.  Iron study showed TIBC that was elevated at 546.  Percentage iron saturation was 36.  Ferritin levels were not checked.  CMP and TSH were within normal.  B12 levels in November 2018 were low at 241.  Upper endoscopy from July 27 showed a Schatzki's ring which was dilated and evidence of gastritis.  Duodenum was normal.  She has had 2-3 EGDs in the past for strictures requiring dilatation.  She has had colonoscopy back in 2011 which was apparently normal.   Results of blood work from  01/15/2018 were as follows: CBC showed white count of 7.4, H&H of 7.5/24 with an MCV of 78.2 and a platelet count of 299.  Ferritin levels were low at 7.  Iron studies showed a low iron saturation and elevated TIBC of 560 haptoglobin was normal, celiac disease panel was negative, myeloma panel revealed no monoclonal protein.  B12 level was low low at 210.  Reticulocyte count was elevated at 7.5 indicating response to anemia.  Folate level was normal at 7.6.  Urinalysis did not reveal any hematuria.  Stool H. pylori antigen was negative   [facility] was [facility] and underwent EGD and colonoscopy in May 2019 which was apparently unremarkable.  Patient has not had a capsule endoscopy done yet.  Patient has required multiple IV iron infusions in the past but none since April 2023    Interval history: patient had superficial skin infection after her right knee arthroplasty in October 2024.  She was also recently seen by cardiology for symptoms of shortness of breath and underwent CT angio chest which did not show any evidence of pulmonary embolism.  She denies any blood in her stool or urine presently.   Review of Systems  Constitutional:  Negative for chills, fever, malaise/fatigue and weight loss.  HENT:  Negative for congestion, ear discharge and nosebleeds.   Eyes:  Negative for blurred vision.  Respiratory:  Negative for cough, hemoptysis, sputum production, shortness of breath and wheezing.   Cardiovascular:  Negative for chest pain, palpitations, orthopnea and claudication.  Gastrointestinal:  Negative for abdominal pain, blood  in stool, constipation, diarrhea, heartburn, melena, nausea and vomiting.  Genitourinary:  Negative for dysuria, flank pain, frequency, hematuria and urgency.  Musculoskeletal:  Negative for back pain, joint pain and myalgias.  Skin:  Negative for rash.  Neurological:  Negative for dizziness, tingling, focal weakness, seizures, weakness and headaches.   Endo/Heme/Allergies:  Does not bruise/bleed easily.  Psychiatric/Behavioral:  Negative for depression and suicidal ideas. The patient does not have insomnia.     Allergies  Allergen Reactions   Iodinated Contrast Media Other (See Comments)    Becomes 'unresponsive' to ORAL and IV DYE BETADINE ON THE SKIN IS OKAY unresponsive Patient could hear things but not responsive Becomes 'unresponsive' to ORAL and IV DYE BETADINE ON THE SKIN IS OKAY  Patient could hear things but not responsive Becomes 'unresponsive' to ORAL and IV DYE BETADINE ON THE SKIN IS OKAY Becomes 'unresponsive' to ORAL and IV DYE BETADINE ON THE SKIN IS OKAY unresponsive Patient could hear things but not responsiveBecomes 'unresponsive' to ORAL and IV DYE BETADINE ON THE SKIN IS OKAY   Iodine     Other reaction(s): Other (See Comments) Becomes 'unresponsive' to ORAL and IV DYE BETADINE ON THE SKIN IS OKAY unresponsive Patient could hear things but not responsive Becomes 'unresponsive' to ORAL and IV DYE BETADINE ON THE SKIN IS OKAY  Patient could hear things but not responsive Becomes 'unresponsive' to ORAL and IV DYE BETADINE ON THE SKIN IS OKAY Becomes 'unresponsive' to ORAL and IV DYE BETADINE ON THE SKIN IS OKAY unresponsive Patient could hear things but not responsiveBecomes 'unresponsive' to ORAL and IV DYE BETADINE ON THE SKIN IS OKAY   Diclofenac Hives    HORRIBLE RASH with both PATCH OR CREAM   Ibuprofen Itching   Zyrtec [Cetirizine] Other (See Comments)    HEADACHE    Cephalexin Rash   Maxalt [Rizatriptan Benzoate] Other (See Comments)    'Heart Races'   Orphenadrine Citrate Other (See Comments)    Patient unsure of this allergy.   Zithromax [Azithromycin] Other (See Comments)    Severe Abdominal Cramps   Zomig [Zolmitriptan] Other (See Comments)    'Heart Races'    Past Medical History:  Diagnosis Date   Anemia    iron deficiency and b12 deficiency   Anxiety    Aortic  atherosclerosis (HCC)    Arthritis    Asthma    Diabetes mellitus without complication (HCC)    Fasciitis    left foot   GERD (gastroesophageal reflux disease)    History of kidney stones    Hypercholesterolemia    Hypertension    Migraines    MIGRAINES HAVE IMPROVED SINCE RECEIVING IRON   Pneumonia    Primary osteoarthritis of right knee    Tachycardia     Past Surgical History:  Procedure Laterality Date   CHOLECYSTECTOMY  2004   COLONOSCOPY WITH ESOPHAGOGASTRODUODENOSCOPY (EGD)  02/2018   ESOPHAGOGASTRODUODENOSCOPY N/A 10/16/2020   Procedure: ESOPHAGOGASTRODUODENOSCOPY (EGD);  Surgeon: Regis Bill, MD;  Location[facility] END[facility]COPY;  Service: Endoscopy;  Laterality: N/A;   ESOPHAGOGASTRODUODENOSCOPY (EGD) WITH PROPOFOL N/A 06/06/2016   Procedure: ESOPHAGOGASTRODUODENOSCOPY (EGD) WITH PROPOFOL;  Surgeon: Scot Jun, MD;  Location: [facility] ENDOSCOPY;  Service: Endoscopy;  Laterality: N/A;   EXTRACORPOREAL SHOCK WAVE LITHOTRIPSY  2010   JOINT REPLACEMENT  2013   LT TKR   SAVORY DILATION N/A 06/06/2016   Procedure: SAVORY DILATION;  Surgeon: Scot Jun, MD;  Location: [facility] ENDOSCOPY;  Service: Endoscopy;  Laterality: N/A;  SAVORY DILATION  02/2018   SHOULDER ARTHROSCOPY WITH OPEN ROTATOR CUFF REPAIR Right 05/24/2018   Procedure: right shoulder arthroscopy, extensive arthroscopic debridement, decompression, open rotator cuff repair, biceps tenodesis;  Surgeon: Christena Flake, MD;  Location: [facility] ORS;  Service: Orthopedics;  Laterality: Right;   TONSILLECTOMY  1978   TOTAL KNEE ARTHROPLASTY Right 08/15/2023   Procedure: TOTAL KNEE ARTHROPLASTY;  Surgeon: Christena Flake, MD;  Location: [facility] ORS;  Service: Orthopedics;  Laterality: Right;    Social History   Socioeconomic History   Marital status: Married    Spouse name: Darcel Bayley   Number of children: Not on file   Years of education: Not on file   Highest education level: Not on file  Occupational History   Not on  file  Tobacco Use   Smoking status: Never   Smokeless tobacco: Never  Vaping Use   Vaping status: Never Used  Substance and Sexual Activity   Alcohol use: No   Drug use: No   Sexual activity: Not Currently  Other Topics Concern   Not on file  Social History Narrative   Not on file   Social Drivers of Health   Financial Resource Strain: Not on file  Food Insecurity: No Food Insecurity (08/15/2023)   Hunger Vital Sign    Worried About Running Out of Food in the Last Year: Never true    Ran Out of Food in the Last Year: Never true  Transportation Needs: No Transportation Needs (08/15/2023)   PRAPARE - Administrator, Civil Service (Medical): No    Lack of Transportation (Non-Medical): No  Physical Activity: Not on file  Stress: Not on file  Social Connections: Not on file  Intimate Partner Violence: Not At Risk (08/15/2023)   Humiliation, Afraid, Rape, and Kick questionnaire    Fear of Current or Ex-Partner: No    Emotionally Abused: No    Physically Abused: No    Sexually Abused: No    Family History  Problem Relation Age of Onset   COPD Mother    Heart disease Mother    Asthma Mother    Schizophrenia Mother    Hemophilia Father    Prostate cancer Father    Heart disease Father    Epilepsy Sister    Stroke Sister    Alcohol abuse Sister    Hypertension Sister    Epilepsy Brother    Lupus Daughter    Gallbladder disease Daughter    Breast cancer Neg Hx      Current Outpatient Medications:    acetaminophen (TYLENOL) 500 MG tablet, Take 1,000 mg by mouth every 6 (six) hours as needed., Disp: , Rfl:    albuterol (PROVENTIL HFA;VENTOLIN HFA) 108 (90 Base) MCG/ACT inhaler, Inhale 2 puffs into the lungs every 6 (six) hours as needed for wheezing or shortness of breath., Disp: , Rfl:    atenolol (TENORMIN) 50 MG tablet, Take 25 mg by mouth daily., Disp: , Rfl:    Atogepant (QULIPTA PO), Take by mouth daily., Disp: , Rfl:    carboxymethylcellulose (REFRESH  PLUS) 0.5 % SOLN, Place 1 drop into both eyes 3 (three) times daily as needed., Disp: , Rfl:    cetirizine (ZYRTEC) 10 MG tablet, Take 10 mg by mouth as needed for allergies., Disp: , Rfl:    Cholecalciferol 2000 units CAPS, Take 2,000 Units by mouth daily., Disp: , Rfl:    cyclobenzaprine (FLEXERIL) 5 MG tablet, Take 5 mg by mouth 3 (three) times daily  as needed for muscle spasms., Disp: , Rfl:    estrogens, conjugated, (PREMARIN) 0.625 MG tablet, Take 0.625 mg by mouth 3 (three) times a week. Take daily for 21 days then do not take for 7 days., Disp: , Rfl:    fluticasone (FLONASE) 50 MCG/ACT nasal spray, Place 1 spray into both nostrils as needed for allergies or rhinitis., Disp: , Rfl:    gabapentin (NEURONTIN) 800 MG tablet, Take by mouth. 7 pm, Disp: , Rfl:    Magnesium 250 MG TABS, Take 1 tablet by mouth daily., Disp: , Rfl:    Menthol, Topical Analgesic, (BIOFREEZE ROLL-ON EX), Apply topically as needed., Disp: , Rfl:    Multiple Vitamin (MULTI-VITAMIN) tablet, Take 1 tablet by mouth daily., Disp: , Rfl:    naratriptan (AMERGE) 2.5 MG tablet, Take 2.5 mg by mouth as needed for migraine. Take one (1) tablet at onset of headache; if returns or does not resolve, may repeat after 4 hours; do not exceed five (5) mg in 24 hours., Disp: , Rfl:    omeprazole (PRILOSEC) 40 MG capsule, Take 40 mg by mouth 2 (two) times daily., Disp: , Rfl:    ondansetron (ZOFRAN) 4 MG tablet, Take 1 tablet (4 mg total) by mouth every 6 (six) hours as needed for nausea., Disp: 28 tablet, Rfl: 0   OVER THE COUNTER MEDICATION, 1 tablet daily. Iron Repair Plus - folate - vit B12 - 25 mcg -iron 20 mg, Disp: , Rfl:    oxyCODONE (ROXICODONE) 5 MG immediate release tablet, Take 1-2 tablets (5-10 mg total) by mouth every 4 (four) hours as needed for moderate pain or severe pain., Disp: 40 tablet, Rfl: 0   OZEMPIC, 0.25 OR 0.5 MG/DOSE, 2 MG/3ML SOPN, Inject into the skin. Sunday, Disp: , Rfl:    PREVIDENT 5000 BOOSTER PLUS  1.1 % PSTE, Place onto teeth., Disp: , Rfl:    promethazine (PHENERGAN) 25 MG tablet, Take by mouth every 8 (eight) hours as needed., Disp: , Rfl:    simvastatin (ZOCOR) 10 MG tablet, Take 1 tablet by mouth at bedtime., Disp: , Rfl:    SUMAtriptan (IMITREX) 100 MG tablet, Take 0.5 tablets (50 mg total) by mouth every 2 (two) hours as needed for migraine., Disp: , Rfl:    aspirin EC 325 MG tablet, Take by mouth., Disp: , Rfl:    Menthol, Topical Analgesic, 4 % GEL, Apply topically., Disp: , Rfl:   No results found.  No images are attached to the encounter.      Latest Ref Rng & Units 10/13/2023    8:41 AM  CMP  Glucose 70 - 99 mg/dL 829   BUN 8 - 23 mg/dL 7   Creatinine 5.62 - 1.30 mg/dL 8.65   Sodium 784 - 696 mmol/L 139   Potassium 3.5 - 5.1 mmol/L 4.3   Chloride 98 - 111 mmol/L 100       Latest Ref Rng & Units 11/29/2023   12:40 PM  CBC  WBC 4.0 - 10.5 K/uL 9.0   Hemoglobin 12.0 - 15.0 g/dL 29.5   Hematocrit 28.4 - 46.0 % 46.4   Platelets 150 - 400 K/uL 368      Observation/objective: Appears in no acute distress over video visit today.  Breathing is nonlabored  Assessment and plan: Patient is a 63 year old female with history of iron deficiency anemia possibly secondary to hiatal hernia  Hemoglobin is currently stable between 14-15.  Iron studies are currently pending.  She last received IV  iron in July 2024.  Suspect iron deficiency secondary to large hiatal hernia.  She has had a complete GI workup done in the past and she denies any blood in her stool or urine presently.  I will repeat CBC ferritin and iron studies in 6 months in 1 year and see her back in 1 year.  She will let us know in the interim if she wants to get her labs checked in a sooner if she is symptomatic  Follow-up instructions: As above  I discussed the assessment and treatment plan with the patient. The patient was provided an opportunity to ask questions and all were answered. The patient agreed with  the plan and demonstrated an understanding of the instructions.   The patient was advised to call back or seek an in-person evaluation if the symptoms worsen or if the condition fails to improve as anticipated.  I provided 11 minutes of face-to-face video visit time during this encounter, and > 50% was spent counseling as documented under my assessment & plan.  Visit Diagnosis: 1. Iron deficiency anemia, unspecified iron deficiency anemia type   2. Folate deficiency     Dr. Owens Shark, MD, MPH [facility] Tel- [phone] 11/29/2023 1:55 PM

## 2023-11-29 NOTE — Progress Notes (Signed)
Patient here for oncology follow-up appointment, concerns of some SOB and dizziness

## 2023-11-30 NOTE — Addendum Note (Signed)
Addended byBlinda Leatherwood, Ebony Yorio H on: 11/30/2023 10:36 AM   Modules accepted: Orders

## 2024-02-07 ENCOUNTER — Other Ambulatory Visit: Payer: Self-pay | Admitting: Nurse Practitioner

## 2024-02-07 DIAGNOSIS — Z1231 Encounter for screening mammogram for malignant neoplasm of breast: Secondary | ICD-10-CM

## 2024-04-16 ENCOUNTER — Ambulatory Visit
Admission: RE | Admit: 2024-04-16 | Discharge: 2024-04-16 | Disposition: A | Discharge status: Home / Self Care | Source: Ambulatory Visit | Attending: Nurse Practitioner | Admitting: Nurse Practitioner

## 2024-04-16 DIAGNOSIS — Z1231 Encounter for screening mammogram for malignant neoplasm of breast: Secondary | ICD-10-CM | POA: Insufficient documentation

## 2024-05-22 ENCOUNTER — Inpatient Hospital Stay: Attending: Oncology

## 2024-05-22 DIAGNOSIS — E538 Deficiency of other specified B group vitamins: Secondary | ICD-10-CM | POA: Insufficient documentation

## 2024-05-22 DIAGNOSIS — D509 Iron deficiency anemia, unspecified: Secondary | ICD-10-CM | POA: Insufficient documentation

## 2024-05-22 DIAGNOSIS — Z79899 Other long term (current) drug therapy: Secondary | ICD-10-CM | POA: Diagnosis not present

## 2024-05-22 LAB — CBC
HCT: 47.2 % — ABNORMAL HIGH (ref 36.0–46.0)
Hemoglobin: 15.7 g/dL — ABNORMAL HIGH (ref 12.0–15.0)
MCH: 29.6 pg (ref 26.0–34.0)
MCHC: 33.3 g/dL (ref 30.0–36.0)
MCV: 88.9 fL (ref 80.0–100.0)
Platelets: 382 K/uL (ref 150–400)
RBC: 5.31 MIL/uL — ABNORMAL HIGH (ref 3.87–5.11)
RDW: 13.4 % (ref 11.5–15.5)
WBC: 9 K/uL (ref 4.0–10.5)
nRBC: 0 % (ref 0.0–0.2)

## 2024-05-22 LAB — IRON AND TIBC
Iron: 70 ug/dL (ref 28–170)
Saturation Ratios: 21 % (ref 10.4–31.8)
TIBC: 333 ug/dL (ref 250–450)
UIBC: 263 ug/dL

## 2024-05-22 LAB — FERRITIN: Ferritin: 149 ng/mL (ref 11–307)

## 2024-05-29 ENCOUNTER — Other Ambulatory Visit: Payer: BC Managed Care – PPO

## 2024-07-09 NOTE — Therapy (Unsigned)
 OUTPATIENT PHYSICAL THERAPY SHOULDER/UPPER QUARTER EVALUATION  Patient Name: Andrea Zhang MRN: [id number] DOB:Aug 14, 1961, 63 y.o., female Today's Date: 07/10/2024  END OF SESSION:  PT End of Session - 07/10/24 0729     Visit Number 1    Number of Visits 13    Date for PT Re-Evaluation 08/29/24    Authorization Type BCBS 2025, 30 visits    PT Start Time 0729    PT Stop Time 0819    PT Time Calculation (min) 50 min    Behavior During Therapy [facility] for tasks assessed/performed          Past Medical History:  Diagnosis Date   Anemia    iron  deficiency and b12 deficiency   Anxiety    Aortic atherosclerosis (HCC)    Arthritis    Asthma    Diabetes mellitus without complication (HCC)    Fasciitis    left foot   GERD (gastroesophageal reflux disease)    History of kidney stones    Hypercholesterolemia    Hypertension    Migraines    MIGRAINES HAVE IMPROVED SINCE RECEIVING IRON    Pneumonia    Primary osteoarthritis of right knee    Tachycardia    Past Surgical History:  Procedure Laterality Date   CHOLECYSTECTOMY  2004   COLONOSCOPY WITH ESOPHAGOGASTRODUODENOSCOPY (EGD)  02/2018   ESOPHAGOGASTRODUODENOSCOPY N/A 10/16/2020   Procedure: ESOPHAGOGASTRODUODENOSCOPY (EGD);  Surgeon: Maryruth Ole DASEN, MD;  Location: [facility] ENDOSCOPY;  Service: Endoscopy;  Laterality: N/A;   ESOPHAGOGASTRODUODENOSCOPY (EGD) WITH PROPOFOL  N/A 06/06/2016   Procedure: ESOPHAGOGASTRODUODENOSCOPY (EGD) WITH PROPOFOL ;  Surgeon: Lamar DASEN Holmes, MD;  Location: [facility] ENDOSCOPY;  Service: Endoscopy;  Laterality: N/A;   EXTRACORPOREAL SHOCK WAVE LITHOTRIPSY  2010   JOINT REPLACEMENT  2013   LT TKR   SAVORY DILATION N/A 06/06/2016   Procedure: SAVORY DILATION;  Surgeon: Lamar DASEN Holmes, MD;  Location: [facility] ENDOSCOPY;  Service: Endoscopy;  Laterality: N/A;   SAVORY DILATION  02/2018   SHOULDER ARTHROSCOPY WITH OPEN ROTATOR CUFF REPAIR Right 05/24/2018   Procedure: right shoulder arthroscopy, extensive  arthroscopic debridement, decompression, open rotator cuff repair, biceps tenodesis;  Surgeon: Edie Norleen PARAS, MD;  Location: [facility] ORS;  Service: Orthopedics;  Laterality: Right;   TONSILLECTOMY  1978   TOTAL KNEE ARTHROPLASTY Right 08/15/2023   Procedure: TOTAL KNEE ARTHROPLASTY;  Surgeon: Edie Norleen PARAS, MD;  Location: [facility] ORS;  Service: Orthopedics;  Laterality: Right;   Patient Active Problem List   Diagnosis Date Noted   Status post total knee replacement using cement, right 08/15/2023   Coronary artery calcification 09/09/2019   Aortic atherosclerosis (HCC) 09/09/2019   Allergic rhinitis 08/06/2019   Asthma 08/06/2019   Diabetes mellitus, type 2 (HCC) 08/06/2019   Dysphagia 08/06/2019   GERD (gastroesophageal reflux disease) 08/06/2019   Hypercalcemia 08/06/2019   Hypertension 08/06/2019   Migraines 08/06/2019   Osteoarthritis 08/06/2019   Folate deficiency 03/03/2019   Morbid obesity with BMI of 40.0-44.9, adult (HCC) 06/05/2018   Status post right rotator cuff repair 06/05/2018   Degenerative joint disease of right shoulder 05/24/2018   Degenerative tear of glenoid labrum, right 05/24/2018   Injury of tendon of long head of right biceps 05/24/2018   Nontraumatic complete tear of right rotator cuff 05/14/2018   Rotator cuff tendinitis, right 05/14/2018   Iron  deficiency anemia 01/15/2018   B12 deficiency 09/07/2017   Microalbuminuric diabetic nephropathy (HCC) 09/07/2017   Primary osteoarthritis of right knee 02/19/2015   Restless leg syndrome 10/25/2012   Hyperlipidemia with  target LDL less than 100 05/20/2012    PCP: Lauraine Lamarr Leak, NP  REFERRING PROVIDER: Krystal Doyne, PA-C  REFERRING DIAG:  [phone] (ICD-10-CM) - Bursitis of left shoulder  M50.30 (ICD-10-CM) - Other cervical disc degeneration, unspecified cervical region  M54.2 (ICD-10-CM) - Cervicalgia    RATIONALE FOR EVALUATION AND TREATMENT: Rehabilitation  THERAPY DIAG: Other cervical disc degeneration,  unspecified cervical region  Left shoulder pain, unspecified chronicity  Cervicalgia  Muscle weakness (generalized)  ONSET DATE: Last 3 months, progressively worsening   FOLLOW-UP APPT SCHEDULED WITH REFERRING PROVIDER: No    SUBJECTIVE:                                                                                                                                                                                         SUBJECTIVE STATEMENT:  Pt  is a 63 year old female referred for L upper quarter pain with Hx of bursitis and cervical DDD  PERTINENT HISTORY: Pt  is a 63 year old female referred for L upper quarter pain with Hx of bursitis and cervical DDD.   Atraumatic onset of L shoulder/paracervical pain. Pt reports having notable pain along anterior deltopectoral groove initially, more recently along posterior cuff/posterior deltoid region. Pt reports notable challenge with folding blanket and with reaching L arm forward - sensation of catching. Pain with turning arm outward. Hx of injections in shoulder with short-term benefit. Pt reports trigger point injections with short-term relief of occipital neuralgia/headaches, but these came back with severe intensity. (-) rheumatoid factor. Pt reports doing okay with donning/doffing bra and grooming/self-care ADLs. Hx of R RCR. Pt has migraine Hx and is awaiting botox treatment.   PAIN:  Pain Intensity: Present: 3/10, Best: 0/10, Worst: 10/10 Pain location: Pain into L upper trap, L posterior cuff/posterior deltoid Pain Quality: sharp pain intermittently, dull pain at rest Radiating: Yes , intermittent periscapular pain and pain sometimes to R side also  Numbness/Tingling: No Focal Weakness: Yes, difficulty gripping, aching in her hands  Aggravating factors: forward reaching, outward reaching/external rotation, folding blankets, reaching out of driver's window Relieving factors: not moving, TENS unit 24-hour pain behavior: worse later in  day  History of prior shoulder or neck/shoulder injury, pain, surgery, or therapy: No, only for contralateral shoulder Hx of RCR  Falls: Has patient fallen in last 6 months? No, Number of falls: N/A Dominant hand: right  Imaging: No   Red flags (personal history of cancer, chills/fever, night sweats, nausea, vomiting, unrelenting pain, unexplained weight gain/loss): Positive for nausea only   -Nausea with migraines    PRECAUTIONS: None  WEIGHT BEARING RESTRICTIONS: No  FALLS: Has patient fallen in last 6 months? 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  Recent memory is intact.  Remote memory is intact.  Attention span and concentration are intact.  Expressive speech is intact.  Patient's fund of knowledge is within normal limits for educational level.    Gross Musculoskeletal Assessment Tremor: None Bulk: Normal Tone: Normal  Posture Rounded shoulders/protracted scapulae, mild kyphotic posturing  Cervical Screen AROM: WFL and painless with overpressure in all planes Spurlings A (ipsilateral lateral flexion/axial compression): R: Negative (localized pain R paracervical) L: Negative (pain L occipital region) Distraction/compression: Negative Repeated movement: No centralization or peripheralization with protraction or retraction Hoffman Sign (cervical cord compression): R: Negative L: Negative  AROM AROM (Normal range in degrees) AROM 07/10/24  Cervical  Flexion (50) 40  Extension (80) 53  Right  lateral flexion (45) 43  Left lateral flexion (45) 35*  Right rotation (85) 75  Left rotation (85) 68   Right Left  Shoulder    Flexion 173 163  Extension    Abduction WNL 172  External Rotation 75 66  Internal Rotation WNL 67  Hands Behind Head    Hands Behind Back        Elbow    Flexion    Extension    Pronation    Supination    (* = pain; Blank rows = not tested)  UE MMT: MMT (out of 5) Right 07/10/24 Left 07/10/24      Shoulder   Flexion 4 4-*  Extension    Abduction 4-* 3+*  External rotation 4- 4-  Internal rotation 4+ 4+*  Horizontal abduction    Horizontal adduction    Lower Trapezius    Rhomboids        Elbow  Flexion 5 5  Extension 5 5  Pronation    Supination        (* = pain; Blank rows = not tested)  Sensation Grossly intact to light touch bilateral UE as determined by testing dermatomes C2-T2. Proprioception and hot/cold testing deferred on this date.  Reflexes R/L Biceps (L3/4): 2+/2+  Triceps (S1/2): 2+/2+  Brachioradialis: 2+/2  Palpation Location LEFT  RIGHT           Subocciptials    Cervical paraspinals 1   Upper Trapezius 2   Levator Scapulae    Rhomboid Major/Minor    Sternoclavicular joint    Acromioclavicular joint 1   Coracoid process 1   Long head of biceps 2   Supraspinatus 2   Infraspinatus 2   Subscapularis    Teres Minor    Teres Major    Pectoralis Major    Pectoralis Minor    Anterior Deltoid 1   Lateral Deltoid 1   Posterior Deltoid 1   Latissimus Dorsi    Sternocleidomastoid    (Blank rows = not tested) Graded on 0-4 scale (0 = no pain, 1 = pain, 2 = pain with wincing/grimacing/flinching, 3 = pain with withdrawal, 4 = unwilling to allow palpation), (Blank rows = not tested)   Accessory Motions/Glides Deferred to visit #  2.     SPECIAL TESTS Rotator Cuff  Drop Arm Test: Negative Painful Arc (Pain from 60 to 120 degrees scaption): Negative Infraspinatus Muscle Test: Negative  Subacromial  Impingement Hawkins-Kennedy: Negative Neer (Block scapula, PROM flexion): Negative Painful Arc (Pain from 60 to 120 degrees scaption): Negative Empty Can: Negative (less painful in internally rotated position) External Rotation Resistance: Negative Horizontal Adduction: Negative Scapular Assist: Positive   Bicep Tendon Pathology Speed (shoulder flexion to 90, external rotation, full elbow extension, and forearm supination with resistance: Positive Yergason's (resisted shoulder ER and supination/biceps tendon pathology): Positive     TODAY'S TREATMENT     07/10/2024   Therapeutic Exercise - for HEP establishment, discussion on appropriate exercise/activity modification, PT education   Reviewed baseline home exercises and provided handout for [employer] program (see Access Code); tactile cueing and therapist demonstration utilized as needed for carryover of proper technique to HEP.    Patient education on current condition, anatomy involved, prognosis, plan of care. Discussion on activity modification to prevent flare-up of condition, including avoidance of repetitive motion into abduction/ER/painful ROM.     PATIENT EDUCATION:  Education details: see above for patient education details Person educated: Patient Education method: Explanation, demonstration, handouts Education comprehension: verbalized understanding and returned demonstration   HOME EXERCISE PROGRAM:  Access Code: [email or web address] URL: [email or web address] Date: 07/10/2024 Prepared by: Venetia Endo  Exercises - Seated Scapular Retraction  - 2 x daily - 7 x weekly - 2 sets - 10 reps - 5sec hold - Standing Shoulder Posterior Capsule Stretch  - 2 x daily - 7 x weekly - 3 sets - 30sec hold - Seated Upper Trapezius Stretch  - 2 x daily - 7 x weekly - 3 sets - 30sec hold  ASSESSMENT:  CLINICAL IMPRESSION: Patient is a 63 y.o. female who was seen today for physical therapy evaluation and treatment for L  shoulder/upper quarter pain with Hx of cervical DDD and cervicalgia. Pt does not have clinical presentation or testing consistent with classic radiculopathy or large rotator cuff tear. She has normal L shoulder PROM and low clinical index of suspicion for adhesive capsulitis in spite of demographics matching this. Pt has negative provocative tests for impingement, but positive scapular assistance test. Pt has positive testing for biceps tendonitis. Initial HEP focused on specific stretching for pain relief and periscapular isometrics for scapular control. Pt has current deficits in: L shoulder AROM, dec shoulder flexor/abductor and posterior cuff strength, postural dysfunction/protracted and anteriorly tilted scapulae, and sensitivity along L bicipital tendon region/UT/supraspinatus/infraspinatus. Pt will continue to benefit from skilled PT services to address deficits and improve function.  OBJECTIVE IMPAIRMENTS: decreased ROM, decreased strength, impaired flexibility, impaired UE functional use, postural dysfunction, and pain.   ACTIVITY LIMITATIONS: carrying, lifting, and reach over head  PARTICIPATION LIMITATIONS: meal prep and community activity  PERSONAL FACTORS: Past/current experiences, Time since onset of injury/illness/exacerbation, and 3+ comorbidities: (anemia, anxiety, atherosclerosis, asthma, Type 2 DM, HTN, OA) are also affecting patient's functional outcome.   REHAB POTENTIAL: Good  CLINICAL DECISION MAKING: Evolving/moderate complexity  EVALUATION COMPLEXITY: Moderate   GOALS: Goals reviewed with patient? Yes  SHORT TERM GOALS: Target date: 08/01/2024  Pt will be independent with HEP to improve strength and decrease shoulder pain to improve pain-free function at home and work. Baseline: 07/10/24: Baseline HEP initiated Goal status: INITIAL   LONG TERM GOALS: Target date: 08/29/2024  Pt will have L shoulder AROM within 5 degrees of contralateral upper limb or greater for all  planes of motion  tested without reproduction of pain as needed for reaching, household chores, and lifting tasks.  Baseline:  07/10/24: Deficit relative to RUE with flexion, abduction, ER.  Goal status: INITIAL  2.  Pt will decrease worst shoulder pain by at least 3 points on the NPRS in order to demonstrate clinically significant reduction in shoulder pain. Baseline: 07/10/24: 10/10 at worst  Goal status: INITIAL  3.  Pt will decrease quick DASH score by at least 8% in order to demonstrate clinically significant reduction in disability related to shoulder pain        Baseline: 07/10/24: 47.7% Goal status: INITIAL  4. Pt will increase strength to at least 4/5 or greater MMT grade in order to demonstrate improvement in strength and function for L shoulder/L upper limb      Baseline: 07/10/24: L shoulder MMTs 3+ to 4- with exception of strong and painful IR.  Goal status: INITIAL   PLAN: PT FREQUENCY: 1-2x/week  PT DURATION: 6 weeks  PLANNED INTERVENTIONS: Therapeutic exercises, Therapeutic activity, Neuromuscular re-education, Balance training, Gait training, Patient/Family education, Self Care, Joint mobilization, Joint manipulation, Vestibular training, Canalith repositioning, Orthotic/Fit training, DME instructions, Dry Needling, Electrical stimulation, Spinal manipulation, Spinal mobilization, Cryotherapy, Moist heat, Taping, Traction, Ultrasound, Ionotophoresis 4mg /ml Dexamethasone , Manual therapy, and Re-evaluation.  PLAN FOR NEXT SESSION: Check passive accessories for scapulothoracic junction, GHJ. Continue with scapular control drills. Initiate isometrics for LHB and external rotators. Periscapular isotonics.     Venetia Endo, PT, DPT #[id number]  Venetia ONEIDA Endo, PT 07/10/2024, 10:43 AM

## 2024-07-10 ENCOUNTER — Encounter: Payer: Self-pay | Admitting: Physical Therapy

## 2024-07-10 ENCOUNTER — Ambulatory Visit: Attending: Orthopedic Surgery | Admitting: Physical Therapy

## 2024-07-10 DIAGNOSIS — M5030 Other cervical disc degeneration, unspecified cervical region: Secondary | ICD-10-CM | POA: Diagnosis present

## 2024-07-10 DIAGNOSIS — M25512 Pain in left shoulder: Secondary | ICD-10-CM | POA: Diagnosis present

## 2024-07-10 DIAGNOSIS — M542 Cervicalgia: Secondary | ICD-10-CM | POA: Insufficient documentation

## 2024-07-10 DIAGNOSIS — M6281 Muscle weakness (generalized): Secondary | ICD-10-CM | POA: Insufficient documentation

## 2024-07-14 NOTE — Therapy (Unsigned)
 OUTPATIENT PHYSICAL THERAPY SHOULDER/UPPER QUARTER TREATMENT  Patient Name: Andrea Zhang MRN: [id number] DOB:September 26, 1961, 63 y.o., female Today's Date: 07/15/2024  END OF SESSION:  PT End of Session - 07/15/24 0731     Visit Number 2    Number of Visits 13    Date for PT Re-Evaluation 08/29/24    Authorization Type BCBS 2025, 30 visits    PT Start Time 0732    PT Stop Time 0815    PT Time Calculation (min) 43 min    Behavior During Therapy WFL for tasks assessed/performed           Past Medical History:  Diagnosis Date   Anemia    iron  deficiency and b12 deficiency   Anxiety    Aortic atherosclerosis (HCC)    Arthritis    Asthma    Diabetes mellitus without complication (HCC)    Fasciitis    left foot   GERD (gastroesophageal reflux disease)    History of kidney stones    Hypercholesterolemia    Hypertension    Migraines    MIGRAINES HAVE IMPROVED SINCE RECEIVING IRON    Pneumonia    Primary osteoarthritis of right knee    Tachycardia    Past Surgical History:  Procedure Laterality Date   CHOLECYSTECTOMY  2004   COLONOSCOPY WITH ESOPHAGOGASTRODUODENOSCOPY (EGD)  02/2018   ESOPHAGOGASTRODUODENOSCOPY N/A 10/16/2020   Procedure: ESOPHAGOGASTRODUODENOSCOPY (EGD);  Surgeon: Maryruth Ole DASEN, MD;  Location: [facility] ENDOSCOPY;  Service: Endoscopy;  Laterality: N/A;   ESOPHAGOGASTRODUODENOSCOPY (EGD) WITH PROPOFOL  N/A 06/06/2016   Procedure: ESOPHAGOGASTRODUODENOSCOPY (EGD) WITH PROPOFOL ;  Surgeon: Lamar DASEN Holmes, MD;  Location: [facility] ENDOSCOPY;  Service: Endoscopy;  Laterality: N/A;   EXTRACORPOREAL SHOCK WAVE LITHOTRIPSY  2010   JOINT REPLACEMENT  2013   LT TKR   SAVORY DILATION N/A 06/06/2016   Procedure: SAVORY DILATION;  Surgeon: Lamar DASEN Holmes, MD;  Location: [facility] ENDOSCOPY;  Service: Endoscopy;  Laterality: N/A;   SAVORY DILATION  02/2018   SHOULDER ARTHROSCOPY WITH OPEN ROTATOR CUFF REPAIR Right 05/24/2018   Procedure: right shoulder arthroscopy,  extensive arthroscopic debridement, decompression, open rotator cuff repair, biceps tenodesis;  Surgeon: Edie Norleen PARAS, MD;  Location: [facility] ORS;  Service: Orthopedics;  Laterality: Right;   TONSILLECTOMY  1978   TOTAL KNEE ARTHROPLASTY Right 08/15/2023   Procedure: TOTAL KNEE ARTHROPLASTY;  Surgeon: Edie Norleen PARAS, MD;  Location: [facility] ORS;  Service: Orthopedics;  Laterality: Right;   Patient Active Problem List   Diagnosis Date Noted   Status post total knee replacement using cement, right 08/15/2023   Coronary artery calcification 09/09/2019   Aortic atherosclerosis (HCC) 09/09/2019   Allergic rhinitis 08/06/2019   Asthma 08/06/2019   Diabetes mellitus, type 2 (HCC) 08/06/2019   Dysphagia 08/06/2019   GERD (gastroesophageal reflux disease) 08/06/2019   Hypercalcemia 08/06/2019   Hypertension 08/06/2019   Migraines 08/06/2019   Osteoarthritis 08/06/2019   Folate deficiency 03/03/2019   Morbid obesity with BMI of 40.0-44.9, adult (HCC) 06/05/2018   Status post right rotator cuff repair 06/05/2018   Degenerative joint disease of right shoulder 05/24/2018   Degenerative tear of glenoid labrum, right 05/24/2018   Injury of tendon of long head of right biceps 05/24/2018   Nontraumatic complete tear of right rotator cuff 05/14/2018   Rotator cuff tendinitis, right 05/14/2018   Iron  deficiency anemia 01/15/2018   B12 deficiency 09/07/2017   Microalbuminuric diabetic nephropathy (HCC) 09/07/2017   Primary osteoarthritis of right knee 02/19/2015   Restless leg syndrome 10/25/2012   Hyperlipidemia  with target LDL less than 100 05/20/2012    PCP: Lauraine Lamarr Leak, NP  REFERRING PROVIDER: Krystal Doyne, PA-C  REFERRING DIAG:  [phone] (ICD-10-CM) - Bursitis of left shoulder  M50.30 (ICD-10-CM) - Other cervical disc degeneration, unspecified cervical region  M54.2 (ICD-10-CM) - Cervicalgia    RATIONALE FOR EVALUATION AND TREATMENT: Rehabilitation  THERAPY DIAG: Left shoulder pain,  unspecified chronicity  Other cervical disc degeneration, unspecified cervical region  Cervicalgia  Muscle weakness (generalized)  ONSET DATE: Last 3 months, progressively worsening   FOLLOW-UP APPT SCHEDULED WITH REFERRING PROVIDER: No   PERTINENT HISTORY: Pt  is a 63 year old female referred for L upper quarter pain with Hx of bursitis and cervical DDD.   Atraumatic onset of L shoulder/paracervical pain. Pt reports having notable pain along anterior deltopectoral groove initially, more recently along posterior cuff/posterior deltoid region. Pt reports notable challenge with folding blanket and with reaching L arm forward - sensation of catching. Pain with turning arm outward. Hx of injections in shoulder with short-term benefit. Pt reports trigger point injections with short-term relief of occipital neuralgia/headaches, but these came back with severe intensity. (-) rheumatoid factor. Pt reports doing okay with donning/doffing bra and grooming/self-care ADLs. Hx of R RCR. Pt has migraine Hx and is awaiting botox treatment.   PAIN:  Pain Intensity: Present: 3/10, Best: 0/10, Worst: 10/10 Pain location: Pain into L upper trap, L posterior cuff/posterior deltoid Pain Quality: sharp pain intermittently, dull pain at rest Radiating: Yes , intermittent periscapular pain and pain sometimes to R side also  Numbness/Tingling: No Focal Weakness: Yes, difficulty gripping, aching in her hands  Aggravating factors: forward reaching, outward reaching/external rotation, folding blankets, reaching out of driver's window Relieving factors: not moving, TENS unit 24-hour pain behavior: worse later in day  History of prior shoulder or neck/shoulder injury, pain, surgery, or therapy: No, only for contralateral shoulder Hx of RCR  Falls: Has patient fallen in last 6 months? No, Number of falls: N/A Dominant hand: right  Imaging: No   Red flags (personal history of cancer, chills/fever, night sweats,  nausea, vomiting, unrelenting pain, unexplained weight gain/loss): Positive for nausea only   -Nausea with migraines    PRECAUTIONS: None  WEIGHT BEARING RESTRICTIONS: No  FALLS: Has patient fallen in last 6 months? 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   Hobbies: Walking exercise after dinner, sewing, genealogy online  Patient Goals: Reduce pain, strengthen upper limb     OBJECTIVE (data from initial evaluation unless otherwise dated):   Patient Surveys  QuickDASH: 47.7%  Posture Rounded shoulders/protracted scapulae, mild kyphotic posturing  Cervical Screen AROM: WFL and painless with overpressure in all planes Spurlings A (ipsilateral lateral flexion/axial compression): R: Negative (localized pain R paracervical) L: Negative (pain L occipital region) Distraction/compression: Negative Repeated movement: No centralization or peripheralization with protraction or retraction Hoffman Sign (cervical cord compression): R: Negative L: Negative  AROM AROM (Normal range in degrees) AROM 07/10/24  Cervical  Flexion (50) 40  Extension (80) 53  Right lateral flexion (45) 43  Left lateral flexion (45) 35*  Right rotation (85) 75  Left rotation (85) 68   Right Left  Shoulder    Flexion 173 163  Extension    Abduction WNL 172  External Rotation 75 66  Internal Rotation WNL 67  Hands Behind Head    Hands Behind Back        Elbow    Flexion    Extension    Pronation    Supination    (* = pain; Blank rows = not tested)  UE MMT: MMT (out of 5) Right 07/10/24 Left 07/10/24      Shoulder   Flexion 4 4-*  Extension    Abduction 4-* 3+*  External rotation 4- 4-  Internal rotation 4+ 4+*  Horizontal abduction    Horizontal adduction     Lower Trapezius    Rhomboids        Elbow  Flexion 5 5  Extension 5 5  Pronation    Supination        (* = pain; Blank rows = not tested)  Sensation Grossly intact to light touch bilateral UE as determined by testing dermatomes C2-T2. Proprioception and hot/cold testing deferred on this date.  Reflexes R/L Biceps (L3/4): 2+/2+  Triceps (S1/2): 2+/2+  Brachioradialis: 2+/2  Palpation Location LEFT  RIGHT           Subocciptials    Cervical paraspinals 1   Upper Trapezius 2   Levator Scapulae    Rhomboid Major/Minor    Sternoclavicular joint    Acromioclavicular joint 1   Coracoid process 1   Long head of biceps 2   Supraspinatus 2   Infraspinatus 2   Subscapularis    Teres Minor    Teres Major    Pectoralis Major    Pectoralis Minor    Anterior Deltoid 1   Lateral Deltoid 1   Posterior Deltoid 1   Latissimus Dorsi    Sternocleidomastoid    (Blank rows = not tested) Graded on 0-4 scale (0 = no pain, 1 = pain, 2 = pain with wincing/grimacing/flinching, 3 = pain with withdrawal, 4 = unwilling to allow palpation), (Blank rows = not tested)    SPECIAL TESTS Rotator Cuff  Drop Arm Test: Negative Painful Arc (Pain from 60 to 120 degrees scaption): Negative Infraspinatus Muscle Test: Negative  Subacromial Impingement Hawkins-Kennedy: Negative Neer (Block scapula, PROM flexion): Negative Painful Arc (Pain from 60 to 120 degrees scaption): Negative Empty Can: Negative (less painful in internally rotated position) External Rotation Resistance: Negative Horizontal Adduction: Negative Scapular Assist: Positive   Bicep Tendon Pathology Speed (shoulder flexion to 90, external rotation, full elbow extension, and forearm supination with resistance: Positive Yergason's (resisted shoulder ER and supination/biceps tendon pathology): Positive   Accessory Motions/Glides (updated 07/15/24) Glenohumeral: WNL with no notable mobility deficits Scapulothoracic: Mild  hypomobility with protraction and upward rotation    TODAY'S TREATMENT     07/15/2024   SUBJECTIVE STATEMENT:   Patient reports 2/10 pain at arrival to PT. Patient reports doing okay with her initial HEP.    Therapeutic Exercise - for improved soft tissue flexibility and extensibility as needed for ROM, improved strength as needed to improve capacity for overhead activity and lifting   Upper body ergometer, 2 minutes forward, 2 minutes backward - for tissue warm-up to improve muscle performance, improved soft tissue mobility/extensibility - subjective obtained during this time  Shoulder isometrics; flexion, ER; x 10, 5 sec  -fleeting discomfort with ER isometrics  PATIENT EDUCATION: HEP update and review; discussed expectations for post-exercise soreness and modifying force with isometrics prn.   *next visit* Tband walkouts   Manual Therapy - for symptom modulation, soft tissue sensitivity and mobility, joint mobility, ROM  Passive accessories updated (see above)  STM/DTM/TPR: L upper trapezius, L posterior cuff; x 8 minutes Scapulothoracic mobilization, focus on protraction and upward rotation; 2 x 30 sec bouts each;    Neuromuscular Re-education - for scapular control/scapulothoracic stability, postural re-edu  Serratus punch, supine; 2 x 10 High row; 2 x 10, Green Tband; 2 x 10, 3 sec hold      PATIENT EDUCATION:  Education details: see above for patient education details Person educated: Patient Education method: Explanation, demonstration, handouts Education comprehension: verbalized understanding and returned demonstration   HOME EXERCISE PROGRAM:  Access Code: [email or web address] URL: [email or web address] Date: 07/15/2024 Prepared by: Venetia Endo  Exercises - Seated Scapular Retraction  - 2 x daily - 7 x weekly - 2 sets - 10 reps - 5sec hold - Standing Shoulder Posterior Capsule Stretch  - 2 x daily - 7 x weekly - 3 sets - 30sec hold - Seated  Upper Trapezius Stretch  - 2 x daily - 7 x weekly - 3 sets - 30sec hold - Isometric Shoulder Flexion at Wall  - 2 x daily - 7 x weekly - 2 sets - 10 reps - 5sec hold - Isometric Shoulder External Rotation at Wall  - 2 x daily - 7 x weekly - 2 sets - 10 reps - 5sec hold   ASSESSMENT:  CLINICAL IMPRESSION: Patient tolerates initial session relatively well with brief c/o cramping and lateral deltoid/posterior cuff discomfort with isometric external rotation. Pt tolerates other drills well and has fair response with manual intervention. Pt exhibits essentially full L shoulder PROM, but she is limited notably with active ER/ABD and forward reaching tasks. Pt has current deficits in: L shoulder AROM, dec shoulder flexor/abductor and posterior cuff strength, postural dysfunction/protracted and anteriorly tilted scapulae, and sensitivity along L bicipital tendon region/UT/supraspinatus/infraspinatus. Pt will continue to benefit from skilled PT services to address deficits and improve function.  OBJECTIVE IMPAIRMENTS: decreased ROM, decreased strength, impaired flexibility, impaired UE functional use, postural dysfunction, and pain.   ACTIVITY LIMITATIONS: carrying, lifting, and reach over head  PARTICIPATION LIMITATIONS: meal prep and community activity  PERSONAL FACTORS: Past/current experiences, Time since onset of injury/illness/exacerbation, and 3+ comorbidities: (anemia, anxiety, atherosclerosis, asthma, Type 2 DM, HTN, OA) are also affecting patient's functional outcome.   REHAB POTENTIAL: Good  CLINICAL DECISION MAKING: Evolving/moderate complexity  EVALUATION COMPLEXITY: Moderate   GOALS: Goals reviewed with patient? Yes  SHORT TERM GOALS: Target date: 08/01/2024  Pt will be independent with HEP to improve strength and decrease shoulder pain to improve pain-free function at home and work. Baseline: 07/10/24: Baseline HEP initiated Goal status: INITIAL   LONG TERM GOALS: Target date:  08/29/2024  Pt will have L shoulder AROM within 5 degrees of contralateral upper limb or greater for all planes of motion tested without reproduction of pain as needed for reaching, household chores, and lifting tasks.  Baseline:  07/10/24: Deficit relative to RUE with flexion, abduction, ER.  Goal status: INITIAL  2.  Pt will decrease worst shoulder pain by at least 3 points on the NPRS in order to demonstrate clinically significant reduction in shoulder pain. Baseline: 07/10/24: 10/10 at worst  Goal status: INITIAL  3.  Pt will decrease quick DASH score by at least 8% in order to demonstrate clinically significant reduction in disability related to shoulder pain        Baseline: 07/10/24: 47.7% Goal status: INITIAL  4. Pt will increase strength to at least 4/5 or greater MMT grade in order to demonstrate  improvement in strength and function for L shoulder/L upper limb      Baseline: 07/10/24: L shoulder MMTs 3+ to 4- with exception of strong and painful IR.  Goal status: INITIAL   PLAN: PT FREQUENCY: 1-2x/week  PT DURATION: 6 weeks  PLANNED INTERVENTIONS: Therapeutic exercises, Therapeutic activity, Neuromuscular re-education, Balance training, Gait training, Patient/Family education, Self Care, Joint mobilization, Joint manipulation, Vestibular training, Canalith repositioning, Orthotic/Fit training, DME instructions, Dry Needling, Electrical stimulation, Spinal manipulation, Spinal mobilization, Cryotherapy, Moist heat, Taping, Traction, Ultrasound, Ionotophoresis 4mg /ml Dexamethasone , Manual therapy, and Re-evaluation.  PLAN FOR NEXT SESSION: Continue with scapular control drills. Continue isometrics for LHB and external rotators. Periscapular isotonics.     Venetia Endo, PT, DPT #[id number]  Venetia ONEIDA Endo, PT 07/15/2024, 8:15 AM

## 2024-07-15 ENCOUNTER — Ambulatory Visit: Admitting: Physical Therapy

## 2024-07-15 ENCOUNTER — Encounter: Payer: Self-pay | Admitting: Physical Therapy

## 2024-07-15 DIAGNOSIS — M542 Cervicalgia: Secondary | ICD-10-CM

## 2024-07-15 DIAGNOSIS — M5030 Other cervical disc degeneration, unspecified cervical region: Secondary | ICD-10-CM

## 2024-07-15 DIAGNOSIS — M6281 Muscle weakness (generalized): Secondary | ICD-10-CM

## 2024-07-15 DIAGNOSIS — M25512 Pain in left shoulder: Secondary | ICD-10-CM

## 2024-07-17 ENCOUNTER — Ambulatory Visit: Admitting: Physical Therapy

## 2024-07-17 ENCOUNTER — Encounter: Payer: Self-pay | Admitting: Physical Therapy

## 2024-07-17 DIAGNOSIS — M5030 Other cervical disc degeneration, unspecified cervical region: Secondary | ICD-10-CM

## 2024-07-17 DIAGNOSIS — M542 Cervicalgia: Secondary | ICD-10-CM

## 2024-07-17 DIAGNOSIS — M6281 Muscle weakness (generalized): Secondary | ICD-10-CM

## 2024-07-17 DIAGNOSIS — M25512 Pain in left shoulder: Secondary | ICD-10-CM

## 2024-07-17 NOTE — Therapy (Signed)
 OUTPATIENT PHYSICAL THERAPY SHOULDER/UPPER QUARTER TREATMENT  Patient Name: Andrea Zhang MRN: [id number] DOB:Dec 21, 1960, 63 y.o., female Today's Date: 07/17/2024  END OF SESSION:  PT End of Session - 07/17/24 0730     Visit Number 3    Number of Visits 13    Date for PT Re-Evaluation 08/29/24    Authorization Type BCBS 2025, 30 visits    PT Start Time 0731    PT Stop Time 0815    PT Time Calculation (min) 44 min    Behavior During Therapy WFL for tasks assessed/performed           Past Medical History:  Diagnosis Date   Anemia    iron  deficiency and b12 deficiency   Anxiety    Aortic atherosclerosis (HCC)    Arthritis    Asthma    Diabetes mellitus without complication (HCC)    Fasciitis    left foot   GERD (gastroesophageal reflux disease)    History of kidney stones    Hypercholesterolemia    Hypertension    Migraines    MIGRAINES HAVE IMPROVED SINCE RECEIVING IRON    Pneumonia    Primary osteoarthritis of right knee    Tachycardia    Past Surgical History:  Procedure Laterality Date   CHOLECYSTECTOMY  2004   COLONOSCOPY WITH ESOPHAGOGASTRODUODENOSCOPY (EGD)  02/2018   ESOPHAGOGASTRODUODENOSCOPY N/A 10/16/2020   Procedure: ESOPHAGOGASTRODUODENOSCOPY (EGD);  Surgeon: Maryruth Ole DASEN, MD;  Location: [facility] ENDOSCOPY;  Service: Endoscopy;  Laterality: N/A;   ESOPHAGOGASTRODUODENOSCOPY (EGD) WITH PROPOFOL  N/A 06/06/2016   Procedure: ESOPHAGOGASTRODUODENOSCOPY (EGD) WITH PROPOFOL ;  Surgeon: Lamar DASEN Holmes, MD;  Location: [facility] ENDOSCOPY;  Service: Endoscopy;  Laterality: N/A;   EXTRACORPOREAL SHOCK WAVE LITHOTRIPSY  2010   JOINT REPLACEMENT  2013   LT TKR   SAVORY DILATION N/A 06/06/2016   Procedure: SAVORY DILATION;  Surgeon: Lamar DASEN Holmes, MD;  Location: [facility] ENDOSCOPY;  Service: Endoscopy;  Laterality: N/A;   SAVORY DILATION  02/2018   SHOULDER ARTHROSCOPY WITH OPEN ROTATOR CUFF REPAIR Right 05/24/2018   Procedure: right shoulder arthroscopy,  extensive arthroscopic debridement, decompression, open rotator cuff repair, biceps tenodesis;  Surgeon: Edie Norleen PARAS, MD;  Location: [facility] ORS;  Service: Orthopedics;  Laterality: Right;   TONSILLECTOMY  1978   TOTAL KNEE ARTHROPLASTY Right 08/15/2023   Procedure: TOTAL KNEE ARTHROPLASTY;  Surgeon: Edie Norleen PARAS, MD;  Location: [facility] ORS;  Service: Orthopedics;  Laterality: Right;   Patient Active Problem List   Diagnosis Date Noted   Status post total knee replacement using cement, right 08/15/2023   Coronary artery calcification 09/09/2019   Aortic atherosclerosis (HCC) 09/09/2019   Allergic rhinitis 08/06/2019   Asthma 08/06/2019   Diabetes mellitus, type 2 (HCC) 08/06/2019   Dysphagia 08/06/2019   GERD (gastroesophageal reflux disease) 08/06/2019   Hypercalcemia 08/06/2019   Hypertension 08/06/2019   Migraines 08/06/2019   Osteoarthritis 08/06/2019   Folate deficiency 03/03/2019   Morbid obesity with BMI of 40.0-44.9, adult (HCC) 06/05/2018   Status post right rotator cuff repair 06/05/2018   Degenerative joint disease of right shoulder 05/24/2018   Degenerative tear of glenoid labrum, right 05/24/2018   Injury of tendon of long head of right biceps 05/24/2018   Nontraumatic complete tear of right rotator cuff 05/14/2018   Rotator cuff tendinitis, right 05/14/2018   Iron  deficiency anemia 01/15/2018   B12 deficiency 09/07/2017   Microalbuminuric diabetic nephropathy (HCC) 09/07/2017   Primary osteoarthritis of right knee 02/19/2015   Restless leg syndrome 10/25/2012   Hyperlipidemia  with target LDL less than 100 05/20/2012    PCP: Lauraine Lamarr Leak, NP  REFERRING PROVIDER: Krystal Doyne, PA-C  REFERRING DIAG:  [phone] (ICD-10-CM) - Bursitis of left shoulder  M50.30 (ICD-10-CM) - Other cervical disc degeneration, unspecified cervical region  M54.2 (ICD-10-CM) - Cervicalgia    RATIONALE FOR EVALUATION AND TREATMENT: Rehabilitation  THERAPY DIAG: Left shoulder pain,  unspecified chronicity  Other cervical disc degeneration, unspecified cervical region  Cervicalgia  Muscle weakness (generalized)  ONSET DATE: Last 3 months, progressively worsening   FOLLOW-UP APPT SCHEDULED WITH REFERRING PROVIDER: No   PERTINENT HISTORY: Pt  is a 63 year old female referred for L upper quarter pain with Hx of bursitis and cervical DDD.   Atraumatic onset of L shoulder/paracervical pain. Pt reports having notable pain along anterior deltopectoral groove initially, more recently along posterior cuff/posterior deltoid region. Pt reports notable challenge with folding blanket and with reaching L arm forward - sensation of catching. Pain with turning arm outward. Hx of injections in shoulder with short-term benefit. Pt reports trigger point injections with short-term relief of occipital neuralgia/headaches, but these came back with severe intensity. (-) rheumatoid factor. Pt reports doing okay with donning/doffing bra and grooming/self-care ADLs. Hx of R RCR. Pt has migraine Hx and is awaiting botox treatment.   PAIN:  Pain Intensity: Present: 3/10, Best: 0/10, Worst: 10/10 Pain location: Pain into L upper trap, L posterior cuff/posterior deltoid Pain Quality: sharp pain intermittently, dull pain at rest Radiating: Yes , intermittent periscapular pain and pain sometimes to R side also  Numbness/Tingling: No Focal Weakness: Yes, difficulty gripping, aching in her hands  Aggravating factors: forward reaching, outward reaching/external rotation, folding blankets, reaching out of driver's window Relieving factors: not moving, TENS unit 24-hour pain behavior: worse later in day  History of prior shoulder or neck/shoulder injury, pain, surgery, or therapy: No, only for contralateral shoulder Hx of RCR  Falls: Has patient fallen in last 6 months? No, Number of falls: N/A Dominant hand: right  Imaging: No   Red flags (personal history of cancer, chills/fever, night sweats,  nausea, vomiting, unrelenting pain, unexplained weight gain/loss): Positive for nausea only   -Nausea with migraines    PRECAUTIONS: None  WEIGHT BEARING RESTRICTIONS: No  FALLS: Has patient fallen in last 6 months? 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   Hobbies: Walking exercise after dinner, sewing, genealogy online  Patient Goals: Reduce pain, strengthen upper limb     OBJECTIVE (data from initial evaluation unless otherwise dated):   Patient Surveys  QuickDASH: 47.7%  Posture Rounded shoulders/protracted scapulae, mild kyphotic posturing  Cervical Screen AROM: WFL and painless with overpressure in all planes Spurlings A (ipsilateral lateral flexion/axial compression): R: Negative (localized pain R paracervical) L: Negative (pain L occipital region) Distraction/compression: Negative Repeated movement: No centralization or peripheralization with protraction or retraction Hoffman Sign (cervical cord compression): R: Negative L: Negative  AROM AROM (Normal range in degrees) AROM 07/10/24  Cervical  Flexion (50) 40  Extension (80) 53  Right lateral flexion (45) 43  Left lateral flexion (45) 35*  Right rotation (85) 75  Left rotation (85) 68   Right Left  Shoulder    Flexion 173 163  Extension    Abduction WNL 172  External Rotation 75 66  Internal Rotation WNL 67  Hands Behind Head    Hands Behind Back        Elbow    Flexion    Extension    Pronation    Supination    (* = pain; Blank rows = not tested)  UE MMT: MMT (out of 5) Right 07/10/24 Left 07/10/24      Shoulder   Flexion 4 4-*  Extension    Abduction 4-* 3+*  External rotation 4- 4-  Internal rotation 4+ 4+*  Horizontal abduction    Horizontal adduction     Lower Trapezius    Rhomboids        Elbow  Flexion 5 5  Extension 5 5  Pronation    Supination        (* = pain; Blank rows = not tested)  Sensation Grossly intact to light touch bilateral UE as determined by testing dermatomes C2-T2. Proprioception and hot/cold testing deferred on this date.  Reflexes R/L Biceps (L3/4): 2+/2+  Triceps (S1/2): 2+/2+  Brachioradialis: 2+/2  Palpation Location LEFT  RIGHT           Subocciptials    Cervical paraspinals 1   Upper Trapezius 2   Levator Scapulae    Rhomboid Major/Minor    Sternoclavicular joint    Acromioclavicular joint 1   Coracoid process 1   Long head of biceps 2   Supraspinatus 2   Infraspinatus 2   Subscapularis    Teres Minor    Teres Major    Pectoralis Major    Pectoralis Minor    Anterior Deltoid 1   Lateral Deltoid 1   Posterior Deltoid 1   Latissimus Dorsi    Sternocleidomastoid    (Blank rows = not tested) Graded on 0-4 scale (0 = no pain, 1 = pain, 2 = pain with wincing/grimacing/flinching, 3 = pain with withdrawal, 4 = unwilling to allow palpation), (Blank rows = not tested)    SPECIAL TESTS Rotator Cuff  Drop Arm Test: Negative Painful Arc (Pain from 60 to 120 degrees scaption): Negative Infraspinatus Muscle Test: Negative  Subacromial Impingement Hawkins-Kennedy: Negative Neer (Block scapula, PROM flexion): Negative Painful Arc (Pain from 60 to 120 degrees scaption): Negative Empty Can: Negative (less painful in internally rotated position) External Rotation Resistance: Negative Horizontal Adduction: Negative Scapular Assist: Positive   Bicep Tendon Pathology Speed (shoulder flexion to 90, external rotation, full elbow extension, and forearm supination with resistance: Positive Yergason's (resisted shoulder ER and supination/biceps tendon pathology): Positive   Accessory Motions/Glides (updated 07/15/24) Glenohumeral: WNL with no notable mobility deficits Scapulothoracic: Mild  hypomobility with protraction and upward rotation     TODAY'S TREATMENT     07/17/2024   SUBJECTIVE STATEMENT:   Patient reports 3/10 pain at arrival. She states she may have had some soreness from isometrics after last visit. She reports discomfort with moving arm into outward/abducted position this AM. Patient reports doing okay after Monday.    Therapeutic Exercise - for improved soft tissue flexibility and extensibility as needed for ROM, improved strength as needed to improve capacity for overhead activity and lifting   Upper body ergometer, 2 minutes forward, 2 minutes backward - for tissue warm-up to improve muscle performance, improved soft tissue mobility/extensibility - subjective obtained during this time  Shoulder isometrics, at wall with folded towel; flexion, ER; reviewed  PATIENT EDUCATION: HEP reviewed; reiterated modifying force with isometrics prn.   *next visit* Tband walkouts   Manual Therapy - for symptom modulation,  soft tissue sensitivity and mobility, joint mobility, ROM    STM/DTM/TPR: L upper trapezius, L posterior cuff; x 8 minutes  L shoulder PROM check: no notable motion loss in any planes, mild pain with end-range flexion  Scapulothoracic mobilization, focus on protraction and upward rotation; 1 x 30 sec bouts each;   -excellent scapular mobility noted today    Neuromuscular Re-education - for scapular control/scapulothoracic stability, postural re-edu  Serratus punch, supine; 2 x 10, with 4-lb Dbells  High row; 2 x 10, Green Tband; 2 x 10, 3 sec hold   Serratus slide at wall with foam roller; 2 x10  -demo and verbal cueing for hand placement     PATIENT EDUCATION:  Education details: see above for patient education details Person educated: Patient Education method: Explanation, demonstration, handouts Education comprehension: verbalized understanding and returned demonstration   HOME EXERCISE PROGRAM:  Access Code: [email or web address] URL:  [email or web address] Date: 07/15/2024 Prepared by: Venetia Endo  Exercises - Seated Scapular Retraction  - 2 x daily - 7 x weekly - 2 sets - 10 reps - 5sec hold - Standing Shoulder Posterior Capsule Stretch  - 2 x daily - 7 x weekly - 3 sets - 30sec hold - Seated Upper Trapezius Stretch  - 2 x daily - 7 x weekly - 3 sets - 30sec hold - Isometric Shoulder Flexion at Wall  - 2 x daily - 7 x weekly - 2 sets - 10 reps - 5sec hold - Isometric Shoulder External Rotation at Wall  - 2 x daily - 7 x weekly - 2 sets - 10 reps - 5sec hold   ASSESSMENT:  CLINICAL IMPRESSION: Patient has some discomfort with external rotation isometrics, but she otherwise tolerates overhead mobility exercises and scapular control work well. Pt has mild taut/tender upper trapezius and infraspinatus on L side with fair response with manual therapy. PROM is WNL with only mild discomfort briefly with end-range flexion. Pt has current deficits in: L shoulder AROM, dec shoulder flexor/abductor and posterior cuff strength, postural dysfunction/protracted and anteriorly tilted scapulae, and sensitivity along L bicipital tendon region/UT/supraspinatus/infraspinatus. Pt will continue to benefit from skilled PT services to address deficits and improve function.  OBJECTIVE IMPAIRMENTS: decreased ROM, decreased strength, impaired flexibility, impaired UE functional use, postural dysfunction, and pain.   ACTIVITY LIMITATIONS: carrying, lifting, and reach over head  PARTICIPATION LIMITATIONS: meal prep and community activity  PERSONAL FACTORS: Past/current experiences, Time since onset of injury/illness/exacerbation, and 3+ comorbidities: (anemia, anxiety, atherosclerosis, asthma, Type 2 DM, HTN, OA) are also affecting patient's functional outcome.   REHAB POTENTIAL: Good  CLINICAL DECISION MAKING: Evolving/moderate complexity  EVALUATION COMPLEXITY: Moderate   GOALS: Goals reviewed with patient? Yes  SHORT  TERM GOALS: Target date: 08/01/2024  Pt will be independent with HEP to improve strength and decrease shoulder pain to improve pain-free function at home and work. Baseline: 07/10/24: Baseline HEP initiated Goal status: INITIAL   LONG TERM GOALS: Target date: 08/29/2024  Pt will have L shoulder AROM within 5 degrees of contralateral upper limb or greater for all planes of motion tested without reproduction of pain as needed for reaching, household chores, and lifting tasks.  Baseline:  07/10/24: Deficit relative to RUE with flexion, abduction, ER.  Goal status: INITIAL  2.  Pt will decrease worst shoulder pain by at least 3 points on the NPRS in order to demonstrate clinically significant reduction in shoulder pain. Baseline: 07/10/24: 10/10 at worst  Goal status: INITIAL  3.  Pt will decrease quick  DASH score by at least 8% in order to demonstrate clinically significant reduction in disability related to shoulder pain        Baseline: 07/10/24: 47.7% Goal status: INITIAL  4. Pt will increase strength to at least 4/5 or greater MMT grade in order to demonstrate improvement in strength and function for L shoulder/L upper limb      Baseline: 07/10/24: L shoulder MMTs 3+ to 4- with exception of strong and painful IR.  Goal status: INITIAL   PLAN: PT FREQUENCY: 1-2x/week  PT DURATION: 6 weeks  PLANNED INTERVENTIONS: Therapeutic exercises, Therapeutic activity, Neuromuscular re-education, Balance training, Gait training, Patient/Family education, Self Care, Joint mobilization, Joint manipulation, Vestibular training, Canalith repositioning, Orthotic/Fit training, DME instructions, Dry Needling, Electrical stimulation, Spinal manipulation, Spinal mobilization, Cryotherapy, Moist heat, Taping, Traction, Ultrasound, Ionotophoresis 4mg /ml Dexamethasone , Manual therapy, and Re-evaluation.  PLAN FOR NEXT SESSION: Continue with scapular control drills. Continue isometrics for LHB and external rotators.  Periscapular isotonics.     Venetia Endo, PT, DPT #[id number]  Venetia ONEIDA Endo, PT 07/17/2024, 8:18 AM

## 2024-07-22 ENCOUNTER — Ambulatory Visit: Admitting: Physical Therapy

## 2024-07-22 DIAGNOSIS — M5030 Other cervical disc degeneration, unspecified cervical region: Secondary | ICD-10-CM

## 2024-07-22 DIAGNOSIS — M6281 Muscle weakness (generalized): Secondary | ICD-10-CM

## 2024-07-22 DIAGNOSIS — M25512 Pain in left shoulder: Secondary | ICD-10-CM

## 2024-07-22 DIAGNOSIS — M542 Cervicalgia: Secondary | ICD-10-CM

## 2024-07-22 NOTE — Therapy (Signed)
 OUTPATIENT PHYSICAL THERAPY SHOULDER/UPPER QUARTER TREATMENT  Patient Name: Andrea Zhang MRN: [id number] DOB:03-Oct-1961, 63 y.o., female Today's Date: 07/22/2024  END OF SESSION:  PT End of Session - 07/22/24 0731     Visit Number 4    Number of Visits 13    Date for PT Re-Evaluation 08/29/24    Authorization Type BCBS 2025, 30 visits    PT Start Time 0732    PT Stop Time 0814    PT Time Calculation (min) 42 min    Behavior During Therapy WFL for tasks assessed/performed           Past Medical History:  Diagnosis Date   Anemia    iron  deficiency and b12 deficiency   Anxiety    Aortic atherosclerosis (HCC)    Arthritis    Asthma    Diabetes mellitus without complication (HCC)    Fasciitis    left foot   GERD (gastroesophageal reflux disease)    History of kidney stones    Hypercholesterolemia    Hypertension    Migraines    MIGRAINES HAVE IMPROVED SINCE RECEIVING IRON    Pneumonia    Primary osteoarthritis of right knee    Tachycardia    Past Surgical History:  Procedure Laterality Date   CHOLECYSTECTOMY  2004   COLONOSCOPY WITH ESOPHAGOGASTRODUODENOSCOPY (EGD)  02/2018   ESOPHAGOGASTRODUODENOSCOPY N/A 10/16/2020   Procedure: ESOPHAGOGASTRODUODENOSCOPY (EGD);  Surgeon: Maryruth Ole DASEN, MD;  Location: [facility] ENDOSCOPY;  Service: Endoscopy;  Laterality: N/A;   ESOPHAGOGASTRODUODENOSCOPY (EGD) WITH PROPOFOL  N/A 06/06/2016   Procedure: ESOPHAGOGASTRODUODENOSCOPY (EGD) WITH PROPOFOL ;  Surgeon: Lamar DASEN Holmes, MD;  Location: [facility] ENDOSCOPY;  Service: Endoscopy;  Laterality: N/A;   EXTRACORPOREAL SHOCK WAVE LITHOTRIPSY  2010   JOINT REPLACEMENT  2013   LT TKR   SAVORY DILATION N/A 06/06/2016   Procedure: SAVORY DILATION;  Surgeon: Lamar DASEN Holmes, MD;  Location: [facility] ENDOSCOPY;  Service: Endoscopy;  Laterality: N/A;   SAVORY DILATION  02/2018   SHOULDER ARTHROSCOPY WITH OPEN ROTATOR CUFF REPAIR Right 05/24/2018   Procedure: right shoulder arthroscopy,  extensive arthroscopic debridement, decompression, open rotator cuff repair, biceps tenodesis;  Surgeon: Edie Norleen PARAS, MD;  Location: [facility] ORS;  Service: Orthopedics;  Laterality: Right;   TONSILLECTOMY  1978   TOTAL KNEE ARTHROPLASTY Right 08/15/2023   Procedure: TOTAL KNEE ARTHROPLASTY;  Surgeon: Edie Norleen PARAS, MD;  Location: [facility] ORS;  Service: Orthopedics;  Laterality: Right;   Patient Active Problem List   Diagnosis Date Noted   Status post total knee replacement using cement, right 08/15/2023   Coronary artery calcification 09/09/2019   Aortic atherosclerosis (HCC) 09/09/2019   Allergic rhinitis 08/06/2019   Asthma 08/06/2019   Diabetes mellitus, type 2 (HCC) 08/06/2019   Dysphagia 08/06/2019   GERD (gastroesophageal reflux disease) 08/06/2019   Hypercalcemia 08/06/2019   Hypertension 08/06/2019   Migraines 08/06/2019   Osteoarthritis 08/06/2019   Folate deficiency 03/03/2019   Morbid obesity with BMI of 40.0-44.9, adult (HCC) 06/05/2018   Status post right rotator cuff repair 06/05/2018   Degenerative joint disease of right shoulder 05/24/2018   Degenerative tear of glenoid labrum, right 05/24/2018   Injury of tendon of long head of right biceps 05/24/2018   Nontraumatic complete tear of right rotator cuff 05/14/2018   Rotator cuff tendinitis, right 05/14/2018   Iron  deficiency anemia 01/15/2018   B12 deficiency 09/07/2017   Microalbuminuric diabetic nephropathy (HCC) 09/07/2017   Primary osteoarthritis of right knee 02/19/2015   Restless leg syndrome 10/25/2012   Hyperlipidemia  with target LDL less than 100 05/20/2012    PCP: Lauraine Lamarr Leak, NP  REFERRING PROVIDER: Krystal Doyne, PA-C  REFERRING DIAG:  [phone] (ICD-10-CM) - Bursitis of left shoulder  M50.30 (ICD-10-CM) - Other cervical disc degeneration, unspecified cervical region  M54.2 (ICD-10-CM) - Cervicalgia    RATIONALE FOR EVALUATION AND TREATMENT: Rehabilitation  THERAPY DIAG: Left shoulder pain,  unspecified chronicity  Other cervical disc degeneration, unspecified cervical region  Cervicalgia  Muscle weakness (generalized)  ONSET DATE: Last 3 months, progressively worsening   FOLLOW-UP APPT SCHEDULED WITH REFERRING PROVIDER: No   PERTINENT HISTORY: Pt  is a 63 year old female referred for L upper quarter pain with Hx of bursitis and cervical DDD.   Atraumatic onset of L shoulder/paracervical pain. Pt reports having notable pain along anterior deltopectoral groove initially, more recently along posterior cuff/posterior deltoid region. Pt reports notable challenge with folding blanket and with reaching L arm forward - sensation of catching. Pain with turning arm outward. Hx of injections in shoulder with short-term benefit. Pt reports trigger point injections with short-term relief of occipital neuralgia/headaches, but these came back with severe intensity. (-) rheumatoid factor. Pt reports doing okay with donning/doffing bra and grooming/self-care ADLs. Hx of R RCR 2019. Pt has migraine Hx and is awaiting botox treatment.   PAIN:  Pain Intensity: Present: 3/10, Best: 0/10, Worst: 10/10 Pain location: Pain into L upper trap, L posterior cuff/posterior deltoid Pain Quality: sharp pain intermittently, dull pain at rest Radiating: Yes , intermittent periscapular pain and pain sometimes to R side also  Numbness/Tingling: No Focal Weakness: Yes, difficulty gripping, aching in her hands  Aggravating factors: forward reaching, outward reaching/external rotation, folding blankets, reaching out of driver's window Relieving factors: not moving, TENS unit 24-hour pain behavior: worse later in day  History of prior shoulder or neck/shoulder injury, pain, surgery, or therapy: No, only for contralateral shoulder Hx of RCR  Falls: Has patient fallen in last 6 months? No, Number of falls: N/A Dominant hand: right  Imaging: No   Red flags (personal history of cancer, chills/fever, night sweats,  nausea, vomiting, unrelenting pain, unexplained weight gain/loss): Positive for nausea only   -Nausea with migraines    PRECAUTIONS: None  WEIGHT BEARING RESTRICTIONS: No  FALLS: Has patient fallen in last 6 months? 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   Hobbies: Walking exercise after dinner, sewing, genealogy online  Patient Goals: Reduce pain, strengthen upper limb     OBJECTIVE (data from initial evaluation unless otherwise dated):   Patient Surveys  QuickDASH: 47.7%  Posture Rounded shoulders/protracted scapulae, mild kyphotic posturing  Cervical Screen AROM: WFL and painless with overpressure in all planes Spurlings A (ipsilateral lateral flexion/axial compression): R: Negative (localized pain R paracervical) L: Negative (pain L occipital region) Distraction/compression: Negative Repeated movement: No centralization or peripheralization with protraction or retraction Hoffman Sign (cervical cord compression): R: Negative L: Negative  AROM AROM (Normal range in degrees) AROM 07/10/24  Cervical  Flexion (50) 40  Extension (80) 53  Right lateral flexion (45) 43  Left lateral flexion (45) 35*  Right rotation (85) 75  Left rotation (85) 68   Right Left  Shoulder    Flexion 173 163  Extension    Abduction WNL 172  External Rotation 75  66  Internal Rotation WNL 67  Hands Behind Head    Hands Behind Back        Elbow    Flexion    Extension    Pronation    Supination    (* = pain; Blank rows = not tested)  UE MMT: MMT (out of 5) Right 07/10/24 Left 07/10/24      Shoulder   Flexion 4 4-*  Extension    Abduction 4-* 3+*  External rotation 4- 4-  Internal rotation 4+ 4+*  Horizontal abduction    Horizontal adduction     Lower Trapezius    Rhomboids        Elbow  Flexion 5 5  Extension 5 5  Pronation    Supination        (* = pain; Blank rows = not tested)  Sensation Grossly intact to light touch bilateral UE as determined by testing dermatomes C2-T2. Proprioception and hot/cold testing deferred on this date.  Reflexes R/L Biceps (L3/4): 2+/2+  Triceps (S1/2): 2+/2+  Brachioradialis: 2+/2  Palpation Location LEFT  RIGHT           Subocciptials    Cervical paraspinals 1   Upper Trapezius 2   Levator Scapulae    Rhomboid Major/Minor    Sternoclavicular joint    Acromioclavicular joint 1   Coracoid process 1   Long head of biceps 2   Supraspinatus 2   Infraspinatus 2   Subscapularis    Teres Minor    Teres Major    Pectoralis Major    Pectoralis Minor    Anterior Deltoid 1   Lateral Deltoid 1   Posterior Deltoid 1   Latissimus Dorsi    Sternocleidomastoid    (Blank rows = not tested) Graded on 0-4 scale (0 = no pain, 1 = pain, 2 = pain with wincing/grimacing/flinching, 3 = pain with withdrawal, 4 = unwilling to allow palpation), (Blank rows = not tested)    SPECIAL TESTS Rotator Cuff  Drop Arm Test: Negative Painful Arc (Pain from 60 to 120 degrees scaption): Negative Infraspinatus Muscle Test: Negative  Subacromial Impingement Hawkins-Kennedy: Negative Neer (Block scapula, PROM flexion): Negative Painful Arc (Pain from 60 to 120 degrees scaption): Negative Empty Can: Negative (less painful in internally rotated position) External Rotation Resistance: Negative Horizontal Adduction: Negative Scapular Assist: Positive   Bicep Tendon Pathology Speed (shoulder flexion to 90, external rotation, full elbow extension, and forearm supination with resistance: Positive Yergason's (resisted shoulder ER and supination/biceps tendon pathology): Positive   Accessory Motions/Glides (updated 07/15/24) Glenohumeral: WNL with no notable mobility deficits Scapulothoracic: Mild  hypomobility with protraction and upward rotation      TODAY'S TREATMENT     07/22/2024   SUBJECTIVE STATEMENT:   Patient reports 3/10 pain at arrival. She reports various regions of musculoskeletal pain, and she is considering follow-up with rheumatology. Patient reports comorbid R shoulder pain with Hx of RCR in 2019.    Therapeutic Exercise - for improved soft tissue flexibility and extensibility as needed for ROM, improved strength as needed to improve capacity for overhead activity and lifting   Upper body ergometer, 2 minutes forward, 2 minutes backward - for tissue warm-up to improve muscle performance, improved soft tissue mobility/extensibility - subjective obtained during this time  Sidelying external rotation, 2-lb weight; 2 x 10   Theraband external rotation isometric walkout, Red Tband; 2 x 10 Theraband internal rotation isometric walkout, Red Tband; 2 x 10 Theraband flexion isometric walkout, Red Tband;  2 x 10  PATIENT EDUCATION: HEP reviewed;    *not today* Shoulder isometrics, at wall with folded towel; flexion, ER; reviewed     Manual Therapy - for symptom modulation, soft tissue sensitivity and mobility, joint mobility, ROM    STM/DTM/TPR: L anterior deltoid and pec major, L upper trapezius, L posterior cuff; x 8 minutes  L shoulder PROM check: no notable motion loss in any planes, mild pain with end-range flexion  Scapulothoracic mobilization, focus on protraction and upward rotation; 1 x 30 sec bouts each;   -excellent scapular mobility noted today    Neuromuscular Re-education - for scapular control/scapulothoracic stability, postural re-edu   Serratus slide at wall with foam roller; 2 x10  -demo and verbal cueing for hand placement  Standing mid-row with Blue Tband; 2 x 10, 3 sec hold  -tactile cueing for scap retraction/depression   *not today* Serratus punch, supine; 2 x 10, with 4-lb Dbells   High row; 2 x 10, Green Tband; 2 x 10, 3 sec  hold    PATIENT EDUCATION:  Education details: see above for patient education details Person educated: Patient Education method: Explanation, demonstration, handouts Education comprehension: verbalized understanding and returned demonstration   HOME EXERCISE PROGRAM:  Access Code: [email or web address] URL: [email or web address] Date: 07/15/2024 Prepared by: Venetia Endo  Exercises - Seated Scapular Retraction  - 2 x daily - 7 x weekly - 2 sets - 10 reps - 5sec hold - Standing Shoulder Posterior Capsule Stretch  - 2 x daily - 7 x weekly - 3 sets - 30sec hold - Seated Upper Trapezius Stretch  - 2 x daily - 7 x weekly - 3 sets - 30sec hold - Isometric Shoulder Flexion at Wall  - 2 x daily - 7 x weekly - 2 sets - 10 reps - 5sec hold - Isometric Shoulder External Rotation at Wall  - 2 x daily - 7 x weekly - 2 sets - 10 reps - 5sec hold   ASSESSMENT:  CLINICAL IMPRESSION: Patient has comorbid R shoulder pain limiting her level of function. She's had some various musculoskeletal complaints for which she is considering f/u with rheumatology. Patient exhibits excellent PROM of L shoulder and WNL scapulothoracic mobility. We modestly progressed with reactive isometrics today, and these are well tolerated. Pt has current deficits in: L shoulder AROM, dec shoulder flexor/abductor and posterior cuff strength, postural dysfunction/protracted and anteriorly tilted scapulae, and sensitivity along L bicipital tendon region/UT/supraspinatus/infraspinatus. Pt will continue to benefit from skilled PT services to address deficits and improve function.  OBJECTIVE IMPAIRMENTS: decreased ROM, decreased strength, impaired flexibility, impaired UE functional use, postural dysfunction, and pain.   ACTIVITY LIMITATIONS: carrying, lifting, and reach over head  PARTICIPATION LIMITATIONS: meal prep and community activity  PERSONAL FACTORS: Past/current experiences, Time since onset of  injury/illness/exacerbation, and 3+ comorbidities: (anemia, anxiety, atherosclerosis, asthma, Type 2 DM, HTN, OA) are also affecting patient's functional outcome.   REHAB POTENTIAL: Good  CLINICAL DECISION MAKING: Evolving/moderate complexity  EVALUATION COMPLEXITY: Moderate   GOALS: Goals reviewed with patient? Yes  SHORT TERM GOALS: Target date: 08/01/2024  Pt will be independent with HEP to improve strength and decrease shoulder pain to improve pain-free function at home and work. Baseline: 07/10/24: Baseline HEP initiated Goal status: INITIAL   LONG TERM GOALS: Target date: 08/29/2024  Pt will have L shoulder AROM within 5 degrees of contralateral upper limb or greater for all planes of motion tested without reproduction of pain as needed for reaching, household chores, and lifting  tasks.  Baseline:  07/10/24: Deficit relative to RUE with flexion, abduction, ER.  Goal status: INITIAL  2.  Pt will decrease worst shoulder pain by at least 3 points on the NPRS in order to demonstrate clinically significant reduction in shoulder pain. Baseline: 07/10/24: 10/10 at worst  Goal status: INITIAL  3.  Pt will decrease quick DASH score by at least 8% in order to demonstrate clinically significant reduction in disability related to shoulder pain        Baseline: 07/10/24: 47.7% Goal status: INITIAL  4. Pt will increase strength to at least 4/5 or greater MMT grade in order to demonstrate improvement in strength and function for L shoulder/L upper limb      Baseline: 07/10/24: L shoulder MMTs 3+ to 4- with exception of strong and painful IR.  Goal status: INITIAL   PLAN: PT FREQUENCY: 1-2x/week  PT DURATION: 6 weeks  PLANNED INTERVENTIONS: Therapeutic exercises, Therapeutic activity, Neuromuscular re-education, Balance training, Gait training, Patient/Family education, Self Care, Joint mobilization, Joint manipulation, Vestibular training, Canalith repositioning, Orthotic/Fit training, DME  instructions, Dry Needling, Electrical stimulation, Spinal manipulation, Spinal mobilization, Cryotherapy, Moist heat, Taping, Traction, Ultrasound, Ionotophoresis 4mg /ml Dexamethasone , Manual therapy, and Re-evaluation.  PLAN FOR NEXT SESSION: Continue with scapular control drills. Continue isometrics for LHB and external rotators. Periscapular isotonics.     Venetia Endo, PT, DPT #[id number]  Venetia ONEIDA Endo, PT 07/22/2024, 8:21 AM

## 2024-07-24 ENCOUNTER — Ambulatory Visit: Admitting: Physical Therapy

## 2024-07-24 ENCOUNTER — Encounter: Payer: Self-pay | Admitting: Physical Therapy

## 2024-07-24 DIAGNOSIS — M25512 Pain in left shoulder: Secondary | ICD-10-CM

## 2024-07-24 DIAGNOSIS — M5030 Other cervical disc degeneration, unspecified cervical region: Secondary | ICD-10-CM

## 2024-07-24 DIAGNOSIS — M6281 Muscle weakness (generalized): Secondary | ICD-10-CM

## 2024-07-24 DIAGNOSIS — M542 Cervicalgia: Secondary | ICD-10-CM

## 2024-07-24 NOTE — Therapy (Signed)
 OUTPATIENT PHYSICAL THERAPY SHOULDER/UPPER QUARTER TREATMENT  Patient Name: Andrea Zhang MRN: [id number] DOB:10-31-1961, 63 y.o., female Today's Date: 07/24/2024  END OF SESSION:  PT End of Session - 07/24/24 0726     Visit Number 5    Number of Visits 13    Date for PT Re-Evaluation 08/29/24    Authorization Type BCBS 2025, 30 visits    PT Start Time 0730    PT Stop Time 0812    PT Time Calculation (min) 42 min    Behavior During Therapy WFL for tasks assessed/performed           Past Medical History:  Diagnosis Date   Anemia    iron  deficiency and b12 deficiency   Anxiety    Aortic atherosclerosis (HCC)    Arthritis    Asthma    Diabetes mellitus without complication (HCC)    Fasciitis    left foot   GERD (gastroesophageal reflux disease)    History of kidney stones    Hypercholesterolemia    Hypertension    Migraines    MIGRAINES HAVE IMPROVED SINCE RECEIVING IRON    Pneumonia    Primary osteoarthritis of right knee    Tachycardia    Past Surgical History:  Procedure Laterality Date   CHOLECYSTECTOMY  2004   COLONOSCOPY WITH ESOPHAGOGASTRODUODENOSCOPY (EGD)  02/2018   ESOPHAGOGASTRODUODENOSCOPY N/A 10/16/2020   Procedure: ESOPHAGOGASTRODUODENOSCOPY (EGD);  Surgeon: Maryruth Ole DASEN, MD;  Location: [facility] ENDOSCOPY;  Service: Endoscopy;  Laterality: N/A;   ESOPHAGOGASTRODUODENOSCOPY (EGD) WITH PROPOFOL  N/A 06/06/2016   Procedure: ESOPHAGOGASTRODUODENOSCOPY (EGD) WITH PROPOFOL ;  Surgeon: Lamar DASEN Holmes, MD;  Location: [facility] ENDOSCOPY;  Service: Endoscopy;  Laterality: N/A;   EXTRACORPOREAL SHOCK WAVE LITHOTRIPSY  2010   JOINT REPLACEMENT  2013   LT TKR   SAVORY DILATION N/A 06/06/2016   Procedure: SAVORY DILATION;  Surgeon: Lamar DASEN Holmes, MD;  Location: [facility] ENDOSCOPY;  Service: Endoscopy;  Laterality: N/A;   SAVORY DILATION  02/2018   SHOULDER ARTHROSCOPY WITH OPEN ROTATOR CUFF REPAIR Right 05/24/2018   Procedure: right shoulder arthroscopy,  extensive arthroscopic debridement, decompression, open rotator cuff repair, biceps tenodesis;  Surgeon: Edie Norleen PARAS, MD;  Location: [facility] ORS;  Service: Orthopedics;  Laterality: Right;   TONSILLECTOMY  1978   TOTAL KNEE ARTHROPLASTY Right 08/15/2023   Procedure: TOTAL KNEE ARTHROPLASTY;  Surgeon: Edie Norleen PARAS, MD;  Location: [facility] ORS;  Service: Orthopedics;  Laterality: Right;   Patient Active Problem List   Diagnosis Date Noted   Status post total knee replacement using cement, right 08/15/2023   Coronary artery calcification 09/09/2019   Aortic atherosclerosis (HCC) 09/09/2019   Allergic rhinitis 08/06/2019   Asthma 08/06/2019   Diabetes mellitus, type 2 (HCC) 08/06/2019   Dysphagia 08/06/2019   GERD (gastroesophageal reflux disease) 08/06/2019   Hypercalcemia 08/06/2019   Hypertension 08/06/2019   Migraines 08/06/2019   Osteoarthritis 08/06/2019   Folate deficiency 03/03/2019   Morbid obesity with BMI of 40.0-44.9, adult (HCC) 06/05/2018   Status post right rotator cuff repair 06/05/2018   Degenerative joint disease of right shoulder 05/24/2018   Degenerative tear of glenoid labrum, right 05/24/2018   Injury of tendon of long head of right biceps 05/24/2018   Nontraumatic complete tear of right rotator cuff 05/14/2018   Rotator cuff tendinitis, right 05/14/2018   Iron  deficiency anemia 01/15/2018   B12 deficiency 09/07/2017   Microalbuminuric diabetic nephropathy (HCC) 09/07/2017   Primary osteoarthritis of right knee 02/19/2015   Restless leg syndrome 10/25/2012   Hyperlipidemia  with target LDL less than 100 05/20/2012    PCP: Lauraine Lamarr Leak, NP  REFERRING PROVIDER: Krystal Doyne, PA-C  REFERRING DIAG:  [phone] (ICD-10-CM) - Bursitis of left shoulder  M50.30 (ICD-10-CM) - Other cervical disc degeneration, unspecified cervical region  M54.2 (ICD-10-CM) - Cervicalgia    RATIONALE FOR EVALUATION AND TREATMENT: Rehabilitation  THERAPY DIAG: Left shoulder pain,  unspecified chronicity  Other cervical disc degeneration, unspecified cervical region  Cervicalgia  Muscle weakness (generalized)  ONSET DATE: Last 3 months, progressively worsening   FOLLOW-UP APPT SCHEDULED WITH REFERRING PROVIDER: No   PERTINENT HISTORY: Pt  is a 63 year old female referred for L upper quarter pain with Hx of bursitis and cervical DDD.   Atraumatic onset of L shoulder/paracervical pain. Pt reports having notable pain along anterior deltopectoral groove initially, more recently along posterior cuff/posterior deltoid region. Pt reports notable challenge with folding blanket and with reaching L arm forward - sensation of catching. Pain with turning arm outward. Hx of injections in shoulder with short-term benefit. Pt reports trigger point injections with short-term relief of occipital neuralgia/headaches, but these came back with severe intensity. (-) rheumatoid factor. Pt reports doing okay with donning/doffing bra and grooming/self-care ADLs. Hx of R RCR 2019. Pt has migraine Hx and is awaiting botox treatment.   PAIN:  Pain Intensity: Present: 3/10, Best: 0/10, Worst: 10/10 Pain location: Pain into L upper trap, L posterior cuff/posterior deltoid Pain Quality: sharp pain intermittently, dull pain at rest Radiating: Yes , intermittent periscapular pain and pain sometimes to R side also  Numbness/Tingling: No Focal Weakness: Yes, difficulty gripping, aching in her hands  Aggravating factors: forward reaching, outward reaching/external rotation, folding blankets, reaching out of driver's window Relieving factors: not moving, TENS unit 24-hour pain behavior: worse later in day  History of prior shoulder or neck/shoulder injury, pain, surgery, or therapy: No, only for contralateral shoulder Hx of RCR  Falls: Has patient fallen in last 6 months? No, Number of falls: N/A Dominant hand: right  Imaging: No   Red flags (personal history of cancer, chills/fever, night sweats,  nausea, vomiting, unrelenting pain, unexplained weight gain/loss): Positive for nausea only   -Nausea with migraines    PRECAUTIONS: None  WEIGHT BEARING RESTRICTIONS: No  FALLS: Has patient fallen in last 6 months? 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   Hobbies: Walking exercise after dinner, sewing, genealogy online  Patient Goals: Reduce pain, strengthen upper limb     OBJECTIVE (data from initial evaluation unless otherwise dated):   Patient Surveys  QuickDASH: 47.7%  Posture Rounded shoulders/protracted scapulae, mild kyphotic posturing  Cervical Screen AROM: WFL and painless with overpressure in all planes Spurlings A (ipsilateral lateral flexion/axial compression): R: Negative (localized pain R paracervical) L: Negative (pain L occipital region) Distraction/compression: Negative Repeated movement: No centralization or peripheralization with protraction or retraction Hoffman Sign (cervical cord compression): R: Negative L: Negative  AROM AROM (Normal range in degrees) AROM 07/10/24  Cervical  Flexion (50) 40  Extension (80) 53  Right lateral flexion (45) 43  Left lateral flexion (45) 35*  Right rotation (85) 75  Left rotation (85) 68   Right Left  Shoulder    Flexion 173 163  Extension    Abduction WNL 172  External Rotation 75  66  Internal Rotation WNL 67  Hands Behind Head    Hands Behind Back        Elbow    Flexion    Extension    Pronation    Supination    (* = pain; Blank rows = not tested)  UE MMT: MMT (out of 5) Right 07/10/24 Left 07/10/24      Shoulder   Flexion 4 4-*  Extension    Abduction 4-* 3+*  External rotation 4- 4-  Internal rotation 4+ 4+*  Horizontal abduction    Horizontal adduction     Lower Trapezius    Rhomboids        Elbow  Flexion 5 5  Extension 5 5  Pronation    Supination        (* = pain; Blank rows = not tested)  Sensation Grossly intact to light touch bilateral UE as determined by testing dermatomes C2-T2. Proprioception and hot/cold testing deferred on this date.  Reflexes R/L Biceps (L3/4): 2+/2+  Triceps (S1/2): 2+/2+  Brachioradialis: 2+/2  Palpation Location LEFT  RIGHT           Subocciptials    Cervical paraspinals 1   Upper Trapezius 2   Levator Scapulae    Rhomboid Major/Minor    Sternoclavicular joint    Acromioclavicular joint 1   Coracoid process 1   Long head of biceps 2   Supraspinatus 2   Infraspinatus 2   Subscapularis    Teres Minor    Teres Major    Pectoralis Major    Pectoralis Minor    Anterior Deltoid 1   Lateral Deltoid 1   Posterior Deltoid 1   Latissimus Dorsi    Sternocleidomastoid    (Blank rows = not tested) Graded on 0-4 scale (0 = no pain, 1 = pain, 2 = pain with wincing/grimacing/flinching, 3 = pain with withdrawal, 4 = unwilling to allow palpation), (Blank rows = not tested)    SPECIAL TESTS Rotator Cuff  Drop Arm Test: Negative Painful Arc (Pain from 60 to 120 degrees scaption): Negative Infraspinatus Muscle Test: Negative  Subacromial Impingement Hawkins-Kennedy: Negative Neer (Block scapula, PROM flexion): Negative Painful Arc (Pain from 60 to 120 degrees scaption): Negative Empty Can: Negative (less painful in internally rotated position) External Rotation Resistance: Negative Horizontal Adduction: Negative Scapular Assist: Positive   Bicep Tendon Pathology Speed (shoulder flexion to 90, external rotation, full elbow extension, and forearm supination with resistance: Positive Yergason's (resisted shoulder ER and supination/biceps tendon pathology): Positive   Accessory Motions/Glides (updated 07/15/24) Glenohumeral: WNL with no notable mobility deficits Scapulothoracic: Mild  hypomobility with protraction and upward rotation      TODAY'S TREATMENT     07/24/2024   SUBJECTIVE STATEMENT:   Patient reports 4/10 pain at arrival. Pt reports more general achy pain with rain this AM. Patient reports some soreness after last visit, but she feels she did well yesterday.    Therapeutic Exercise - for improved soft tissue flexibility and extensibility as needed for ROM, improved strength as needed to improve capacity for overhead activity and lifting   Upper body ergometer, 2 minutes forward, 2 minutes backward - for tissue warm-up to improve muscle performance, improved soft tissue mobility/extensibility - subjective obtained during this time  Sidelying external rotation, 2-lb weight; 2 x 10   -pain-free ROM only Theraband external rotation isometric walkout, Red Tband; 2 x 10 Theraband internal rotation isometric walkout, Red Tband; 2 x 10 Theraband flexion isometric walkout, Red  Tband; 2 x 10  PATIENT EDUCATION: HEP reviewed;    *not today* Shoulder isometrics, at wall with folded towel; flexion, ER; reviewed     Manual Therapy - for symptom modulation, soft tissue sensitivity and mobility, joint mobility, ROM   L shoulder PROM check: no notable motion loss in any planes, mild pain with end-range flexion  *not today* Scapulothoracic mobilization, focus on protraction and upward rotation; 1 x 30 sec bouts each;   -excellent scapular mobility noted today    Neuromuscular Re-education - soft tissue work and mechanical stimulation for desensitization/neuromodulatory effect; for scapular control/scapulothoracic stability, postural re-edu  STM/DTM/TPR: L anterior deltoid and pec major, L upper trapezius; x 8 minutes Rhythmic stabilization; x 3 min Serratus slide at wall with foam roller; 2 x10  -demo and verbal cueing for hand placement  *next visit* Standing mid-row with Blue Tband; 2 x 10, 3 sec hold  -tactile cueing for scap  retraction/depression   *not today* Serratus punch, supine; 2 x 10, with 4-lb Dbells   High row; 2 x 10, Green Tband; 2 x 10, 3 sec hold    PATIENT EDUCATION:  Education details: see above for patient education details Person educated: Patient Education method: Explanation, demonstration, handouts Education comprehension: verbalized understanding and returned demonstration   HOME EXERCISE PROGRAM:  Access Code: [email or web address] URL: [email or web address] Date: 07/15/2024 Prepared by: Venetia Endo  Exercises - Seated Scapular Retraction  - 2 x daily - 7 x weekly - 2 sets - 10 reps - 5sec hold - Standing Shoulder Posterior Capsule Stretch  - 2 x daily - 7 x weekly - 3 sets - 30sec hold - Seated Upper Trapezius Stretch  - 2 x daily - 7 x weekly - 3 sets - 30sec hold - Isometric Shoulder Flexion at Wall  - 2 x daily - 7 x weekly - 2 sets - 10 reps - 5sec hold - Isometric Shoulder External Rotation at Wall  - 2 x daily - 7 x weekly - 2 sets - 10 reps - 5sec hold   ASSESSMENT:  CLINICAL IMPRESSION: Patient has slightly higher NPRS this AM with rainy/cloudy weather. She reports tolerating last visit well and is maintaining her HEP. She has exhibited excellent passive ROM since outset of care, but she is still limited with tolerance of active shoulder elevation and overhead activities. Pt has taut/tender periscapular mm and upper trap that can bother her continuously through her workday. We continued with RTC/LHB isometrics and periscapular re-training today with pt tolerating these drills well. Pt has current deficits in: L shoulder AROM, dec shoulder flexor/abductor and posterior cuff strength, postural dysfunction/protracted and anteriorly tilted scapulae, and sensitivity along L bicipital tendon region/UT/supraspinatus/infraspinatus. Pt will continue to benefit from skilled PT services to address deficits and improve function.  OBJECTIVE IMPAIRMENTS: decreased ROM, decreased  strength, impaired flexibility, impaired UE functional use, postural dysfunction, and pain.   ACTIVITY LIMITATIONS: carrying, lifting, and reach over head  PARTICIPATION LIMITATIONS: meal prep and community activity  PERSONAL FACTORS: Past/current experiences, Time since onset of injury/illness/exacerbation, and 3+ comorbidities: (anemia, anxiety, atherosclerosis, asthma, Type 2 DM, HTN, OA) are also affecting patient's functional outcome.   REHAB POTENTIAL: Good  CLINICAL DECISION MAKING: Evolving/moderate complexity  EVALUATION COMPLEXITY: Moderate   GOALS: Goals reviewed with patient? Yes  SHORT TERM GOALS: Target date: 08/01/2024  Pt will be independent with HEP to improve strength and decrease shoulder pain to improve pain-free function at home and work. Baseline: 07/10/24: Baseline HEP initiated Goal status: INITIAL  LONG TERM GOALS: Target date: 08/29/2024  Pt will have L shoulder AROM within 5 degrees of contralateral upper limb or greater for all planes of motion tested without reproduction of pain as needed for reaching, household chores, and lifting tasks.  Baseline:  07/10/24: Deficit relative to RUE with flexion, abduction, ER.  Goal status: INITIAL  2.  Pt will decrease worst shoulder pain by at least 3 points on the NPRS in order to demonstrate clinically significant reduction in shoulder pain. Baseline: 07/10/24: 10/10 at worst  Goal status: INITIAL  3.  Pt will decrease quick DASH score by at least 8% in order to demonstrate clinically significant reduction in disability related to shoulder pain        Baseline: 07/10/24: 47.7% Goal status: INITIAL  4. Pt will increase strength to at least 4/5 or greater MMT grade in order to demonstrate improvement in strength and function for L shoulder/L upper limb      Baseline: 07/10/24: L shoulder MMTs 3+ to 4- with exception of strong and painful IR.  Goal status: INITIAL   PLAN: PT FREQUENCY: 1-2x/week  PT DURATION: 6  weeks  PLANNED INTERVENTIONS: Therapeutic exercises, Therapeutic activity, Neuromuscular re-education, Balance training, Gait training, Patient/Family education, Self Care, Joint mobilization, Joint manipulation, Vestibular training, Canalith repositioning, Orthotic/Fit training, DME instructions, Dry Needling, Electrical stimulation, Spinal manipulation, Spinal mobilization, Cryotherapy, Moist heat, Taping, Traction, Ultrasound, Ionotophoresis 4mg /ml Dexamethasone , Manual therapy, and Re-evaluation.  PLAN FOR NEXT SESSION: Continue with scapular control drills. Continue isometrics for LHB and external rotators. Periscapular isotonics.     Venetia Endo, PT, DPT #[id number]  Venetia ONEIDA Endo, PT 07/24/2024, 7:27 AM

## 2024-07-28 NOTE — Therapy (Unsigned)
 OUTPATIENT PHYSICAL THERAPY SHOULDER/UPPER QUARTER TREATMENT  Patient Name: Andrea Zhang MRN: [id number] DOB:1961-01-22, 63 y.o., female Today's Date: 07/29/2024  END OF SESSION:  PT End of Session - 07/29/24 0736     Visit Number 6    Number of Visits 13    Date for Recertification  08/29/24    Authorization Type BCBS 2025, 30 visits    PT Start Time [phone]    PT Stop Time 0818    PT Time Calculation (min) 45 min    Behavior During Therapy [facility] for tasks assessed/performed          Past Medical History:  Diagnosis Date   Anemia    iron  deficiency and b12 deficiency   Anxiety    Aortic atherosclerosis (HCC)    Arthritis    Asthma    Diabetes mellitus without complication (HCC)    Fasciitis    left foot   GERD (gastroesophageal reflux disease)    History of kidney stones    Hypercholesterolemia    Hypertension    Migraines    MIGRAINES HAVE IMPROVED SINCE RECEIVING IRON    Pneumonia    Primary osteoarthritis of right knee    Tachycardia    Past Surgical History:  Procedure Laterality Date   CHOLECYSTECTOMY  2004   COLONOSCOPY WITH ESOPHAGOGASTRODUODENOSCOPY (EGD)  02/2018   ESOPHAGOGASTRODUODENOSCOPY N/A 10/16/2020   Procedure: ESOPHAGOGASTRODUODENOSCOPY (EGD);  Surgeon: Maryruth Ole DASEN, MD;  Location: [facility] ENDOSCOPY;  Service: Endoscopy;  Laterality: N/A;   ESOPHAGOGASTRODUODENOSCOPY (EGD) WITH PROPOFOL  N/A 06/06/2016   Procedure: ESOPHAGOGASTRODUODENOSCOPY (EGD) WITH PROPOFOL ;  Surgeon: Lamar DASEN Holmes, MD;  Location: [facility] ENDOSCOPY;  Service: Endoscopy;  Laterality: N/A;   EXTRACORPOREAL SHOCK WAVE LITHOTRIPSY  2010   JOINT REPLACEMENT  2013   LT TKR   SAVORY DILATION N/A 06/06/2016   Procedure: SAVORY DILATION;  Surgeon: Lamar DASEN Holmes, MD;  Location: [facility] ENDOSCOPY;  Service: Endoscopy;  Laterality: N/A;   SAVORY DILATION  02/2018   SHOULDER ARTHROSCOPY WITH OPEN ROTATOR CUFF REPAIR Right 05/24/2018   Procedure: right shoulder arthroscopy, extensive  arthroscopic debridement, decompression, open rotator cuff repair, biceps tenodesis;  Surgeon: Edie Norleen PARAS, MD;  Location: [facility] ORS;  Service: Orthopedics;  Laterality: Right;   TONSILLECTOMY  1978   TOTAL KNEE ARTHROPLASTY Right 08/15/2023   Procedure: TOTAL KNEE ARTHROPLASTY;  Surgeon: Edie Norleen PARAS, MD;  Location: [facility] ORS;  Service: Orthopedics;  Laterality: Right;   Patient Active Problem List   Diagnosis Date Noted   Status post total knee replacement using cement, right 08/15/2023   Coronary artery calcification 09/09/2019   Aortic atherosclerosis (HCC) 09/09/2019   Allergic rhinitis 08/06/2019   Asthma 08/06/2019   Diabetes mellitus, type 2 (HCC) 08/06/2019   Dysphagia 08/06/2019   GERD (gastroesophageal reflux disease) 08/06/2019   Hypercalcemia 08/06/2019   Hypertension 08/06/2019   Migraines 08/06/2019   Osteoarthritis 08/06/2019   Folate deficiency 03/03/2019   Morbid obesity with BMI of 40.0-44.9, adult (HCC) 06/05/2018   Status post right rotator cuff repair 06/05/2018   Degenerative joint disease of right shoulder 05/24/2018   Degenerative tear of glenoid labrum, right 05/24/2018   Injury of tendon of long head of right biceps 05/24/2018   Nontraumatic complete tear of right rotator cuff 05/14/2018   Rotator cuff tendinitis, right 05/14/2018   Iron  deficiency anemia 01/15/2018   B12 deficiency 09/07/2017   Microalbuminuric diabetic nephropathy (HCC) 09/07/2017   Primary osteoarthritis of right knee 02/19/2015   Restless leg syndrome 10/25/2012   Hyperlipidemia with  target LDL less than 100 05/20/2012    PCP: Lauraine Lamarr Leak, NP  REFERRING PROVIDER: Krystal Doyne, PA-C  REFERRING DIAG:  [phone] (ICD-10-CM) - Bursitis of left shoulder  M50.30 (ICD-10-CM) - Other cervical disc degeneration, unspecified cervical region  M54.2 (ICD-10-CM) - Cervicalgia    RATIONALE FOR EVALUATION AND TREATMENT: Rehabilitation  THERAPY DIAG: Left shoulder pain, unspecified  chronicity  Other cervical disc degeneration, unspecified cervical region  Cervicalgia  Muscle weakness (generalized)  ONSET DATE: Last 3 months, progressively worsening   FOLLOW-UP APPT SCHEDULED WITH REFERRING PROVIDER: No   PERTINENT HISTORY: Pt  is a 63 year old female referred for L upper quarter pain with Hx of bursitis and cervical DDD.   Atraumatic onset of L shoulder/paracervical pain. Pt reports having notable pain along anterior deltopectoral groove initially, more recently along posterior cuff/posterior deltoid region. Pt reports notable challenge with folding blanket and with reaching L arm forward - sensation of catching. Pain with turning arm outward. Hx of injections in shoulder with short-term benefit. Pt reports trigger point injections with short-term relief of occipital neuralgia/headaches, but these came back with severe intensity. (-) rheumatoid factor. Pt reports doing okay with donning/doffing bra and grooming/self-care ADLs. Hx of R RCR 2019. Pt has migraine Hx and is awaiting botox treatment.   PAIN:  Pain Intensity: Present: 3/10, Best: 0/10, Worst: 10/10 Pain location: Pain into L upper trap, L posterior cuff/posterior deltoid Pain Quality: sharp pain intermittently, dull pain at rest Radiating: Yes , intermittent periscapular pain and pain sometimes to R side also  Numbness/Tingling: No Focal Weakness: Yes, difficulty gripping, aching in her hands  Aggravating factors: forward reaching, outward reaching/external rotation, folding blankets, reaching out of driver's window Relieving factors: not moving, TENS unit 24-hour pain behavior: worse later in day  History of prior shoulder or neck/shoulder injury, pain, surgery, or therapy: No, only for contralateral shoulder Hx of RCR  Falls: Has patient fallen in last 6 months? No, Number of falls: N/A Dominant hand: right  Imaging: No   Red flags (personal history of cancer, chills/fever, night sweats, nausea,  vomiting, unrelenting pain, unexplained weight gain/loss): Positive for nausea only   -Nausea with migraines    PRECAUTIONS: None  WEIGHT BEARING RESTRICTIONS: No  FALLS: Has patient fallen in last 6 months? 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   Hobbies: Walking exercise after dinner, sewing, genealogy online  Patient Goals: Reduce pain, strengthen upper limb     OBJECTIVE (data from initial evaluation unless otherwise dated):   Patient Surveys  QuickDASH: 47.7%  Posture Rounded shoulders/protracted scapulae, mild kyphotic posturing  Cervical Screen AROM: WFL and painless with overpressure in all planes Spurlings A (ipsilateral lateral flexion/axial compression): R: Negative (localized pain R paracervical) L: Negative (pain L occipital region) Distraction/compression: Negative Repeated movement: No centralization or peripheralization with protraction or retraction Hoffman Sign (cervical cord compression): R: Negative L: Negative  AROM AROM (Normal range in degrees) AROM 07/10/24  Cervical  Flexion (50) 40  Extension (80) 53  Right lateral flexion (45) 43  Left lateral flexion (45) 35*  Right rotation (85) 75  Left rotation (85) 68   Right Left  Shoulder    Flexion 173 163  Extension    Abduction WNL 172  External Rotation 75 66  Internal Rotation WNL 67  Hands Behind Head    Hands Behind Back        Elbow    Flexion    Extension    Pronation    Supination    (* = pain; Blank rows = not tested)  UE MMT: MMT (out of 5) Right 07/10/24 Left 07/10/24      Shoulder   Flexion 4 4-*  Extension    Abduction 4-* 3+*  External rotation 4- 4-  Internal rotation 4+ 4+*  Horizontal abduction    Horizontal adduction     Lower Trapezius    Rhomboids        Elbow  Flexion 5 5  Extension 5 5  Pronation    Supination        (* = pain; Blank rows = not tested)  Sensation Grossly intact to light touch bilateral UE as determined by testing dermatomes C2-T2. Proprioception and hot/cold testing deferred on this date.  Reflexes R/L Biceps (L3/4): 2+/2+  Triceps (S1/2): 2+/2+  Brachioradialis: 2+/2  Palpation Location LEFT  RIGHT           Subocciptials    Cervical paraspinals 1   Upper Trapezius 2   Levator Scapulae    Rhomboid Major/Minor    Sternoclavicular joint    Acromioclavicular joint 1   Coracoid process 1   Long head of biceps 2   Supraspinatus 2   Infraspinatus 2   Subscapularis    Teres Minor    Teres Major    Pectoralis Major    Pectoralis Minor    Anterior Deltoid 1   Lateral Deltoid 1   Posterior Deltoid 1   Latissimus Dorsi    Sternocleidomastoid    (Blank rows = not tested) Graded on 0-4 scale (0 = no pain, 1 = pain, 2 = pain with wincing/grimacing/flinching, 3 = pain with withdrawal, 4 = unwilling to allow palpation), (Blank rows = not tested)    SPECIAL TESTS Rotator Cuff  Drop Arm Test: Negative Painful Arc (Pain from 60 to 120 degrees scaption): Negative Infraspinatus Muscle Test: Negative  Subacromial Impingement Hawkins-Kennedy: Negative Neer (Block scapula, PROM flexion): Negative Painful Arc (Pain from 60 to 120 degrees scaption): Negative Empty Can: Negative (less painful in internally rotated position) External Rotation Resistance: Negative Horizontal Adduction: Negative Scapular Assist: Positive   Bicep Tendon Pathology Speed (shoulder flexion to 90, external rotation, full elbow extension, and forearm supination with resistance: Positive Yergason's (resisted shoulder ER and supination/biceps tendon pathology): Positive   Accessory Motions/Glides (updated 07/15/24) Glenohumeral: WNL with no notable mobility deficits Scapulothoracic: Mild  hypomobility with protraction and upward rotation      TODAY'S TREATMENT     07/29/2024   SUBJECTIVE STATEMENT:   Patient reports 4/10 pain at arrival to PT. Patient reports various musculoskeletal issues in upper and lower quarter at arrival today. She reports bilateral knee aching and pain into hands and bilateral shoulders recently. Pt reports tolerating last PT visit well. She denies recent fever, N&V, or other signs of infection. No recent upper limb numbness/tingling. She does attest to R temporal HA this AM.   No pain reproduced with C-spine traction/decompression. No paresthesias or UE radiating pain   Therapeutic Exercise - for improved soft tissue flexibility and extensibility as needed for ROM, improved strength as needed to improve capacity for overhead activity and lifting   Upper body ergometer, 2 minutes forward, 2 minutes backward - for tissue warm-up to improve muscle performance, improved soft tissue  mobility/extensibility - subjective obtained during this time  Sidelying external rotation, 2-lb weight; 2 x 10   -pain-free ROM only Theraband external rotation isometric walkout, Red Tband; 2 x 10 Theraband flexion isometric walkout, Red Tband; 2 x 10  PATIENT EDUCATION: HEP updated/reviewed. We discussed various contextual factors contributing to pain experience, including sleep/stress/environment/illness.   *not today* Theraband internal rotation isometric walkout, Red Tband; 2 x 10 Shoulder isometrics, at wall with folded towel; flexion, ER; reviewed     Manual Therapy - for symptom modulation, soft tissue sensitivity and mobility, joint mobility, ROM   L shoulder PROM check: no notable motion loss in any planes, mild pain with end-range flexion  *not today* Scapulothoracic mobilization, focus on protraction and upward rotation; 1 x 30 sec bouts each;   -excellent scapular mobility noted today    Neuromuscular Re-education - soft tissue work and mechanical  stimulation for desensitization/neuromodulatory effect; for scapular control/scapulothoracic stability, postural re-edu  STM/DTM/TPR: L anterior deltoid and pec major, bilat upper trapezius; x 8 minutes Standing mid-row with Blue Tband; 2 x 12, 3 sec hold  -tactile cueing for scap retraction/depression  High row; 2 x 10, Green Tband; 2 x 10, 3 sec hold      *not today* Rhythmic stabilization; x 3 min Serratus slide at wall with foam roller; 2 x10  -demo and verbal cueing for hand placement Serratus punch, supine; 2 x 10, with 4-lb Dbells    PATIENT EDUCATION:  Education details: see above for patient education details Person educated: Patient Education method: Explanation, demonstration, handouts Education comprehension: verbalized understanding and returned demonstration   HOME EXERCISE PROGRAM:  Access Code: [email or web address] URL: [email or web address] Date: 07/29/2024 Prepared by: Venetia Endo  Exercises - Standing Shoulder Posterior Capsule Stretch  - 2 x daily - 7 x weekly - 3 sets - 30sec hold - Seated Upper Trapezius Stretch  - 2 x daily - 7 x weekly - 3 sets - 30sec hold - Shoulder External Rotation Reactive Isometrics  - 2 x daily - 7 x weekly - 2 sets - 10 reps - Standing Shoulder Flexion Reactive Isometrics with Elbow Extended  - 2 x daily - 7 x weekly - 2 sets - 10 reps - Standing Shoulder Row with Anchored Resistance  - 2 x daily - 7 x weekly - 2 sets - 10 reps   ASSESSMENT:  CLINICAL IMPRESSION: Patient has various musculoskeletal complaints affecting bilat UE and bilateral knees recently without notable trauma or recent signs of infection or systemic illness. Patient is opting to follow up with rheumatology regarding various issues occurring simultaneously. Pt has ongoing L>R shoulder pain. Hx of R RCR with good post-op rehab results; it has been bothering her in recent weeks without known specific aggravating factor. Pt is able to continue with  isometric work and periscapular re-training without notable aggravation of symptoms. HEP updated for reactive isometrics this AM.  Pt has current deficits in: L shoulder AROM, dec shoulder flexor/abductor and posterior cuff strength, postural dysfunction/protracted and anteriorly tilted scapulae, and sensitivity along L bicipital tendon region/UT/supraspinatus/infraspinatus. Pt will continue to benefit from skilled PT services to address deficits and improve function.  OBJECTIVE IMPAIRMENTS: decreased ROM, decreased strength, impaired flexibility, impaired UE functional use, postural dysfunction, and pain.   ACTIVITY LIMITATIONS: carrying, lifting, and reach over head  PARTICIPATION LIMITATIONS: meal prep and community activity  PERSONAL FACTORS: Past/current experiences, Time since onset of injury/illness/exacerbation, and 3+ comorbidities: (anemia, anxiety, atherosclerosis, asthma, Type 2 DM, HTN, OA) are also affecting patient's  functional outcome.   REHAB POTENTIAL: Good  CLINICAL DECISION MAKING: Evolving/moderate complexity  EVALUATION COMPLEXITY: Moderate   GOALS: Goals reviewed with patient? Yes  SHORT TERM GOALS: Target date: 08/01/2024  Pt will be independent with HEP to improve strength and decrease shoulder pain to improve pain-free function at home and work. Baseline: 07/10/24: Baseline HEP initiated Goal status: INITIAL   LONG TERM GOALS: Target date: 08/29/2024  Pt will have L shoulder AROM within 5 degrees of contralateral upper limb or greater for all planes of motion tested without reproduction of pain as needed for reaching, household chores, and lifting tasks.  Baseline:  07/10/24: Deficit relative to RUE with flexion, abduction, ER.  Goal status: INITIAL  2.  Pt will decrease worst shoulder pain by at least 3 points on the NPRS in order to demonstrate clinically significant reduction in shoulder pain. Baseline: 07/10/24: 10/10 at worst  Goal status: INITIAL  3.  Pt  will decrease quick DASH score by at least 8% in order to demonstrate clinically significant reduction in disability related to shoulder pain        Baseline: 07/10/24: 47.7% Goal status: INITIAL  4. Pt will increase strength to at least 4/5 or greater MMT grade in order to demonstrate improvement in strength and function for L shoulder/L upper limb      Baseline: 07/10/24: L shoulder MMTs 3+ to 4- with exception of strong and painful IR.  Goal status: INITIAL   PLAN: PT FREQUENCY: 1-2x/week  PT DURATION: 6 weeks  PLANNED INTERVENTIONS: Therapeutic exercises, Therapeutic activity, Neuromuscular re-education, Balance training, Gait training, Patient/Family education, Self Care, Joint mobilization, Joint manipulation, Vestibular training, Canalith repositioning, Orthotic/Fit training, DME instructions, Dry Needling, Electrical stimulation, Spinal manipulation, Spinal mobilization, Cryotherapy, Moist heat, Taping, Traction, Ultrasound, Ionotophoresis 4mg /ml Dexamethasone , Manual therapy, and Re-evaluation.  PLAN FOR NEXT SESSION: Continue with scapular control drills. Continue isometrics for LHB and external rotators. Periscapular isotonics.     Venetia Endo, PT, DPT #[id number]  Venetia ONEIDA Endo, PT 07/29/2024, 8:22 AM

## 2024-07-29 ENCOUNTER — Ambulatory Visit: Admitting: Physical Therapy

## 2024-07-29 ENCOUNTER — Encounter: Payer: Self-pay | Admitting: Physical Therapy

## 2024-07-29 DIAGNOSIS — M6281 Muscle weakness (generalized): Secondary | ICD-10-CM

## 2024-07-29 DIAGNOSIS — M5030 Other cervical disc degeneration, unspecified cervical region: Secondary | ICD-10-CM

## 2024-07-29 DIAGNOSIS — M542 Cervicalgia: Secondary | ICD-10-CM

## 2024-07-29 DIAGNOSIS — M25512 Pain in left shoulder: Secondary | ICD-10-CM

## 2024-07-31 ENCOUNTER — Encounter: Payer: Self-pay | Admitting: Physical Therapy

## 2024-07-31 ENCOUNTER — Ambulatory Visit: Admitting: Physical Therapy

## 2024-07-31 DIAGNOSIS — M5030 Other cervical disc degeneration, unspecified cervical region: Secondary | ICD-10-CM | POA: Diagnosis not present

## 2024-07-31 DIAGNOSIS — M542 Cervicalgia: Secondary | ICD-10-CM

## 2024-07-31 DIAGNOSIS — M25512 Pain in left shoulder: Secondary | ICD-10-CM

## 2024-07-31 DIAGNOSIS — M6281 Muscle weakness (generalized): Secondary | ICD-10-CM

## 2024-07-31 NOTE — Therapy (Signed)
 OUTPATIENT PHYSICAL THERAPY SHOULDER/UPPER QUARTER TREATMENT  Patient Name: Andrea Zhang MRN: [id number] DOB:1961-01-23, 63 y.o., female Today's Date: 07/31/2024  END OF SESSION:  PT End of Session - 07/31/24 0736     Visit Number 7    Number of Visits 13    Date for Recertification  08/29/24    Authorization Type BCBS 2025, 30 visits    PT Start Time [phone]    PT Stop Time 0818    PT Time Calculation (min) 42 min    Behavior During Therapy WFL for tasks assessed/performed          Past Medical History:  Diagnosis Date   Anemia    iron  deficiency and b12 deficiency   Anxiety    Aortic atherosclerosis    Arthritis    Asthma    Diabetes mellitus without complication (HCC)    Fasciitis    left foot   GERD (gastroesophageal reflux disease)    History of kidney stones    Hypercholesterolemia    Hypertension    Migraines    MIGRAINES HAVE IMPROVED SINCE RECEIVING IRON    Pneumonia    Primary osteoarthritis of right knee    Tachycardia    Past Surgical History:  Procedure Laterality Date   CHOLECYSTECTOMY  2004   COLONOSCOPY WITH ESOPHAGOGASTRODUODENOSCOPY (EGD)  02/2018   ESOPHAGOGASTRODUODENOSCOPY N/A 10/16/2020   Procedure: ESOPHAGOGASTRODUODENOSCOPY (EGD);  Surgeon: Maryruth Ole DASEN, MD;  Location: [facility] ENDOSCOPY;  Service: Endoscopy;  Laterality: N/A;   ESOPHAGOGASTRODUODENOSCOPY (EGD) WITH PROPOFOL  N/A 06/06/2016   Procedure: ESOPHAGOGASTRODUODENOSCOPY (EGD) WITH PROPOFOL ;  Surgeon: Lamar DASEN Holmes, MD;  Location: [facility] ENDOSCOPY;  Service: Endoscopy;  Laterality: N/A;   EXTRACORPOREAL SHOCK WAVE LITHOTRIPSY  2010   JOINT REPLACEMENT  2013   LT TKR   SAVORY DILATION N/A 06/06/2016   Procedure: SAVORY DILATION;  Surgeon: Lamar DASEN Holmes, MD;  Location: [facility] ENDOSCOPY;  Service: Endoscopy;  Laterality: N/A;   SAVORY DILATION  02/2018   SHOULDER ARTHROSCOPY WITH OPEN ROTATOR CUFF REPAIR Right 05/24/2018   Procedure: right shoulder arthroscopy, extensive  arthroscopic debridement, decompression, open rotator cuff repair, biceps tenodesis;  Surgeon: Edie Norleen PARAS, MD;  Location: [facility] ORS;  Service: Orthopedics;  Laterality: Right;   TONSILLECTOMY  1978   TOTAL KNEE ARTHROPLASTY Right 08/15/2023   Procedure: TOTAL KNEE ARTHROPLASTY;  Surgeon: Edie Norleen PARAS, MD;  Location: [facility] ORS;  Service: Orthopedics;  Laterality: Right;   Patient Active Problem List   Diagnosis Date Noted   Status post total knee replacement using cement, right 08/15/2023   Coronary artery calcification 09/09/2019   Aortic atherosclerosis 09/09/2019   Allergic rhinitis 08/06/2019   Asthma 08/06/2019   Diabetes mellitus, type 2 (HCC) 08/06/2019   Dysphagia 08/06/2019   GERD (gastroesophageal reflux disease) 08/06/2019   Hypercalcemia 08/06/2019   Hypertension 08/06/2019   Migraines 08/06/2019   Osteoarthritis 08/06/2019   Folate deficiency 03/03/2019   Morbid obesity with BMI of 40.0-44.9, adult (HCC) 06/05/2018   Status post right rotator cuff repair 06/05/2018   Degenerative joint disease of right shoulder 05/24/2018   Degenerative tear of glenoid labrum, right 05/24/2018   Injury of tendon of long head of right biceps 05/24/2018   Nontraumatic complete tear of right rotator cuff 05/14/2018   Rotator cuff tendinitis, right 05/14/2018   Iron  deficiency anemia 01/15/2018   B12 deficiency 09/07/2017   Microalbuminuric diabetic nephropathy (HCC) 09/07/2017   Primary osteoarthritis of right knee 02/19/2015   Restless leg syndrome 10/25/2012   Hyperlipidemia with target LDL  less than 100 05/20/2012    PCP: Lauraine Lamarr Leak, NP  REFERRING PROVIDER: Krystal Doyne, PA-C  REFERRING DIAG:  [phone] (ICD-10-CM) - Bursitis of left shoulder  M50.30 (ICD-10-CM) - Other cervical disc degeneration, unspecified cervical region  M54.2 (ICD-10-CM) - Cervicalgia    RATIONALE FOR EVALUATION AND TREATMENT: Rehabilitation  THERAPY DIAG: Left shoulder pain, unspecified  chronicity  Other cervical disc degeneration, unspecified cervical region  Cervicalgia  Muscle weakness (generalized)  ONSET DATE: Last 3 months, progressively worsening   FOLLOW-UP APPT SCHEDULED WITH REFERRING PROVIDER: No   PERTINENT HISTORY: Pt  is a 63 year old female referred for L upper quarter pain with Hx of bursitis and cervical DDD.   Atraumatic onset of L shoulder/paracervical pain. Pt reports having notable pain along anterior deltopectoral groove initially, more recently along posterior cuff/posterior deltoid region. Pt reports notable challenge with folding blanket and with reaching L arm forward - sensation of catching. Pain with turning arm outward. Hx of injections in shoulder with short-term benefit. Pt reports trigger point injections with short-term relief of occipital neuralgia/headaches, but these came back with severe intensity. (-) rheumatoid factor. Pt reports doing okay with donning/doffing bra and grooming/self-care ADLs. Hx of R RCR 2019. Pt has migraine Hx and is awaiting botox treatment.   PAIN:  Pain Intensity: Present: 3/10, Best: 0/10, Worst: 10/10 Pain location: Pain into L upper trap, L posterior cuff/posterior deltoid Pain Quality: sharp pain intermittently, dull pain at rest Radiating: Yes , intermittent periscapular pain and pain sometimes to R side also  Numbness/Tingling: No Focal Weakness: Yes, difficulty gripping, aching in her hands  Aggravating factors: forward reaching, outward reaching/external rotation, folding blankets, reaching out of driver's window Relieving factors: not moving, TENS unit 24-hour pain behavior: worse later in day  History of prior shoulder or neck/shoulder injury, pain, surgery, or therapy: No, only for contralateral shoulder Hx of RCR  Falls: Has patient fallen in last 6 months? No, Number of falls: N/A Dominant hand: right  Imaging: No   Red flags (personal history of cancer, chills/fever, night sweats, nausea,  vomiting, unrelenting pain, unexplained weight gain/loss): Positive for nausea only   -Nausea with migraines    PRECAUTIONS: None  WEIGHT BEARING RESTRICTIONS: No  FALLS: Has patient fallen in last 6 months? 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   Hobbies: Walking exercise after dinner, sewing, genealogy online  Patient Goals: Reduce pain, strengthen upper limb     OBJECTIVE (data from initial evaluation unless otherwise dated):   Patient Surveys  QuickDASH: 47.7%  Posture Rounded shoulders/protracted scapulae, mild kyphotic posturing  Cervical Screen AROM: WFL and painless with overpressure in all planes Spurlings A (ipsilateral lateral flexion/axial compression): R: Negative (localized pain R paracervical) L: Negative (pain L occipital region) Distraction/compression: Negative Repeated movement: No centralization or peripheralization with protraction or retraction Hoffman Sign (cervical cord compression): R: Negative L: Negative  AROM AROM (Normal range in degrees) AROM 07/10/24  Cervical  Flexion (50) 40  Extension (80) 53  Right lateral flexion (45) 43  Left lateral flexion (45) 35*  Right rotation (85) 75  Left rotation (85) 68   Right Left  Shoulder    Flexion 173 163  Extension    Abduction WNL 172  External Rotation 75 66  Internal  Rotation WNL 67  Hands Behind Head    Hands Behind Back        Elbow    Flexion    Extension    Pronation    Supination    (* = pain; Blank rows = not tested)  UE MMT: MMT (out of 5) Right 07/10/24 Left 07/10/24      Shoulder   Flexion 4 4-*  Extension    Abduction 4-* 3+*  External rotation 4- 4-  Internal rotation 4+ 4+*  Horizontal abduction    Horizontal adduction     Lower Trapezius    Rhomboids        Elbow  Flexion 5 5  Extension 5 5  Pronation    Supination        (* = pain; Blank rows = not tested)  Sensation Grossly intact to light touch bilateral UE as determined by testing dermatomes C2-T2. Proprioception and hot/cold testing deferred on this date.  Reflexes R/L Biceps (L3/4): 2+/2+  Triceps (S1/2): 2+/2+  Brachioradialis: 2+/2  Palpation Location LEFT  RIGHT           Subocciptials    Cervical paraspinals 1   Upper Trapezius 2   Levator Scapulae    Rhomboid Major/Minor    Sternoclavicular joint    Acromioclavicular joint 1   Coracoid process 1   Long head of biceps 2   Supraspinatus 2   Infraspinatus 2   Subscapularis    Teres Minor    Teres Major    Pectoralis Major    Pectoralis Minor    Anterior Deltoid 1   Lateral Deltoid 1   Posterior Deltoid 1   Latissimus Dorsi    Sternocleidomastoid    (Blank rows = not tested) Graded on 0-4 scale (0 = no pain, 1 = pain, 2 = pain with wincing/grimacing/flinching, 3 = pain with withdrawal, 4 = unwilling to allow palpation), (Blank rows = not tested)    SPECIAL TESTS Rotator Cuff  Drop Arm Test: Negative Painful Arc (Pain from 60 to 120 degrees scaption): Negative Infraspinatus Muscle Test: Negative  Subacromial Impingement Hawkins-Kennedy: Negative Neer (Block scapula, PROM flexion): Negative Painful Arc (Pain from 60 to 120 degrees scaption): Negative Empty Can: Negative (less painful in internally rotated position) External Rotation Resistance: Negative Horizontal Adduction: Negative Scapular Assist: Positive   Bicep Tendon Pathology Speed (shoulder flexion to 90, external rotation, full elbow extension, and forearm supination with resistance: Positive Yergason's (resisted shoulder ER and supination/biceps tendon pathology): Positive   Accessory Motions/Glides (updated 07/15/24) Glenohumeral: WNL with no notable mobility deficits Scapulothoracic: Mild  hypomobility with protraction and upward rotation      TODAY'S TREATMENT     07/31/2024   SUBJECTIVE STATEMENT:   Patient reports 2/10 pain affecting her L shoulder at arrival to PT. Patient reports some other comorbid issues including R shoulder s/p RCR and bilat knee pain. Pt reports tolerating new home exercises well.     Therapeutic Exercise - for improved soft tissue flexibility and extensibility as needed for ROM, improved strength as needed to improve capacity for overhead activity and lifting   Upper body ergometer, 2 minutes forward, 2 minutes backward - for tissue warm-up to improve muscle performance, improved soft tissue mobility/extensibility - subjective obtained during this time  Sidelying external rotation, 2-lb weight; 2 x 10   -pain-free ROM only  Supine shoulder flexion with 1-lb Dbell; 1 x 12 Supine shoulder flexion AROM; 1 x 12  PATIENT EDUCATION: HEP reviewed. Discussed  expected progression with PT.    *not today* Theraband external rotation isometric walkout, Red Tband; 2 x 10 Theraband flexion isometric walkout, Red Tband; 2 x 10 Theraband internal rotation isometric walkout, Red Tband; 2 x 10 Shoulder isometrics, at wall with folded towel; flexion, ER; reviewed     Manual Therapy - for symptom modulation, soft tissue sensitivity and mobility, joint mobility, ROM   Suboccipital release and upper cervical distraction with; x 8 minutes  L shoulder PROM check: no notable motion loss in any planes, mild pain with end-range flexion and ABD  *not today* Scapulothoracic mobilization, focus on protraction and upward rotation; 1 x 30 sec bouts each;   -excellent scapular mobility noted today    Neuromuscular Re-education - soft tissue work and mechanical stimulation for desensitization/neuromodulatory effect; for scapular control/scapulothoracic stability, postural re-edu  STM/DTM/TPR: L anterior deltoid and pec major, bilat upper trapezius; x 8  minutes  Bilat UT DTM/TPR; x 5 min  Standing mid-row with Blue Tband; 2 x 12, 3 sec hold  -tactile cueing for scap retraction/depression    *next visit* High row; 2 x 10, Green Tband; 2 x 10, 3 sec hold     *not today* Rhythmic stabilization; x 3 min Serratus slide at wall with foam roller; 2 x10  -demo and verbal cueing for hand placement Serratus punch, supine; 2 x 10, with 4-lb Dbells    PATIENT EDUCATION:  Education details: see above for patient education details Person educated: Patient Education method: Explanation, demonstration, handouts Education comprehension: verbalized understanding and returned demonstration   HOME EXERCISE PROGRAM:  Access Code: [email or web address] URL: [email or web address] Date: 07/29/2024 Prepared by: Venetia Endo  Exercises - Standing Shoulder Posterior Capsule Stretch  - 2 x daily - 7 x weekly - 3 sets - 30sec hold - Seated Upper Trapezius Stretch  - 2 x daily - 7 x weekly - 3 sets - 30sec hold - Shoulder External Rotation Reactive Isometrics  - 2 x daily - 7 x weekly - 2 sets - 10 reps - Standing Shoulder Flexion Reactive Isometrics with Elbow Extended  - 2 x daily - 7 x weekly - 2 sets - 10 reps - Standing Shoulder Row with Anchored Resistance  - 2 x daily - 7 x weekly - 2 sets - 10 reps   ASSESSMENT:  CLINICAL IMPRESSION: Patient has tolerated progression of isometrics at home relatively well. She has pain with long-lever shoulder flexion after several repetitions with addition of 1-lb Dbell. Pt exhibits good shoulder flexion AROM bilaterally, though reaching out with load can still reproduced shoulder pain. We spent more time addressing suboccipital sensitivity and HA/paracervical symptoms today given notable central pain at arrival. Pt responds fairly well with manual techniques for upper cervical pain - self-massage for suboccipital mm was reviewed. Pt has current deficits in: L shoulder AROM, dec shoulder flexor/abductor  and posterior cuff strength, postural dysfunction/protracted and anteriorly tilted scapulae, and sensitivity along L bicipital tendon region/UT/supraspinatus/infraspinatus. Pt will continue to benefit from skilled PT services to address deficits and improve function.  OBJECTIVE IMPAIRMENTS: decreased ROM, decreased strength, impaired flexibility, impaired UE functional use, postural dysfunction, and pain.   ACTIVITY LIMITATIONS: carrying, lifting, and reach over head  PARTICIPATION LIMITATIONS: meal prep and community activity  PERSONAL FACTORS: Past/current experiences, Time since onset of injury/illness/exacerbation, and 3+ comorbidities: (anemia, anxiety, atherosclerosis, asthma, Type 2 DM, HTN, OA) are also affecting patient's functional outcome.   REHAB POTENTIAL: Good  CLINICAL DECISION MAKING: Evolving/moderate complexity  EVALUATION COMPLEXITY: Moderate  GOALS: Goals reviewed with patient? Yes  SHORT TERM GOALS: Target date: 08/01/2024  Pt will be independent with HEP to improve strength and decrease shoulder pain to improve pain-free function at home and work. Baseline: 07/10/24: Baseline HEP initiated Goal status: INITIAL   LONG TERM GOALS: Target date: 08/29/2024  Pt will have L shoulder AROM within 5 degrees of contralateral upper limb or greater for all planes of motion tested without reproduction of pain as needed for reaching, household chores, and lifting tasks.  Baseline:  07/10/24: Deficit relative to RUE with flexion, abduction, ER.  Goal status: INITIAL  2.  Pt will decrease worst shoulder pain by at least 3 points on the NPRS in order to demonstrate clinically significant reduction in shoulder pain. Baseline: 07/10/24: 10/10 at worst  Goal status: INITIAL  3.  Pt will decrease quick DASH score by at least 8% in order to demonstrate clinically significant reduction in disability related to shoulder pain        Baseline: 07/10/24: 47.7% Goal status: INITIAL  4. Pt  will increase strength to at least 4/5 or greater MMT grade in order to demonstrate improvement in strength and function for L shoulder/L upper limb      Baseline: 07/10/24: L shoulder MMTs 3+ to 4- with exception of strong and painful IR.  Goal status: INITIAL   PLAN: PT FREQUENCY: 1-2x/week  PT DURATION: 6 weeks  PLANNED INTERVENTIONS: Therapeutic exercises, Therapeutic activity, Neuromuscular re-education, Balance training, Gait training, Patient/Family education, Self Care, Joint mobilization, Joint manipulation, Vestibular training, Canalith repositioning, Orthotic/Fit training, DME instructions, Dry Needling, Electrical stimulation, Spinal manipulation, Spinal mobilization, Cryotherapy, Moist heat, Taping, Traction, Ultrasound, Ionotophoresis 4mg /ml Dexamethasone , Manual therapy, and Re-evaluation.  PLAN FOR NEXT SESSION: Continue with scapular control drills. Continue isometrics for LHB and external rotators. Periscapular isotonics.     Venetia Endo, PT, DPT #[id number]  Venetia ONEIDA Endo, PT 07/31/2024, 7:40 AM

## 2024-08-05 ENCOUNTER — Encounter: Payer: Self-pay | Admitting: Physical Therapy

## 2024-08-05 ENCOUNTER — Ambulatory Visit: Admitting: Physical Therapy

## 2024-08-05 DIAGNOSIS — M5030 Other cervical disc degeneration, unspecified cervical region: Secondary | ICD-10-CM | POA: Diagnosis not present

## 2024-08-05 DIAGNOSIS — M25512 Pain in left shoulder: Secondary | ICD-10-CM

## 2024-08-05 DIAGNOSIS — M6281 Muscle weakness (generalized): Secondary | ICD-10-CM

## 2024-08-05 DIAGNOSIS — M542 Cervicalgia: Secondary | ICD-10-CM

## 2024-08-05 NOTE — Therapy (Signed)
 OUTPATIENT PHYSICAL THERAPY SHOULDER/UPPER QUARTER TREATMENT  Patient Name: Andrea Zhang MRN: [id number] DOB:1961-08-25, 63 y.o., female Today's Date: 08/05/2024  END OF SESSION:  PT End of Session - 08/05/24 0733     Visit Number 8    Number of Visits 13    Date for Recertification  08/29/24    Authorization Type BCBS 2025, 30 visits    PT Start Time 0730    PT Stop Time 0812    PT Time Calculation (min) 42 min    Behavior During Therapy WFL for tasks assessed/performed           Past Medical History:  Diagnosis Date   Anemia    iron  deficiency and b12 deficiency   Anxiety    Aortic atherosclerosis    Arthritis    Asthma    Diabetes mellitus without complication (HCC)    Fasciitis    left foot   GERD (gastroesophageal reflux disease)    History of kidney stones    Hypercholesterolemia    Hypertension    Migraines    MIGRAINES HAVE IMPROVED SINCE RECEIVING IRON    Pneumonia    Primary osteoarthritis of right knee    Tachycardia    Past Surgical History:  Procedure Laterality Date   CHOLECYSTECTOMY  2004   COLONOSCOPY WITH ESOPHAGOGASTRODUODENOSCOPY (EGD)  02/2018   ESOPHAGOGASTRODUODENOSCOPY N/A 10/16/2020   Procedure: ESOPHAGOGASTRODUODENOSCOPY (EGD);  Surgeon: Maryruth Ole DASEN, MD;  Location: [facility] ENDOSCOPY;  Service: Endoscopy;  Laterality: N/A;   ESOPHAGOGASTRODUODENOSCOPY (EGD) WITH PROPOFOL  N/A 06/06/2016   Procedure: ESOPHAGOGASTRODUODENOSCOPY (EGD) WITH PROPOFOL ;  Surgeon: Lamar DASEN Holmes, MD;  Location: [facility] ENDOSCOPY;  Service: Endoscopy;  Laterality: N/A;   EXTRACORPOREAL SHOCK WAVE LITHOTRIPSY  2010   JOINT REPLACEMENT  2013   LT TKR   SAVORY DILATION N/A 06/06/2016   Procedure: SAVORY DILATION;  Surgeon: Lamar DASEN Holmes, MD;  Location: [facility] ENDOSCOPY;  Service: Endoscopy;  Laterality: N/A;   SAVORY DILATION  02/2018   SHOULDER ARTHROSCOPY WITH OPEN ROTATOR CUFF REPAIR Right 05/24/2018   Procedure: right shoulder arthroscopy, extensive  arthroscopic debridement, decompression, open rotator cuff repair, biceps tenodesis;  Surgeon: Edie Norleen PARAS, MD;  Location: [facility] ORS;  Service: Orthopedics;  Laterality: Right;   TONSILLECTOMY  1978   TOTAL KNEE ARTHROPLASTY Right 08/15/2023   Procedure: TOTAL KNEE ARTHROPLASTY;  Surgeon: Edie Norleen PARAS, MD;  Location: [facility] ORS;  Service: Orthopedics;  Laterality: Right;   Patient Active Problem List   Diagnosis Date Noted   Status post total knee replacement using cement, right 08/15/2023   Coronary artery calcification 09/09/2019   Aortic atherosclerosis 09/09/2019   Allergic rhinitis 08/06/2019   Asthma 08/06/2019   Diabetes mellitus, type 2 (HCC) 08/06/2019   Dysphagia 08/06/2019   GERD (gastroesophageal reflux disease) 08/06/2019   Hypercalcemia 08/06/2019   Hypertension 08/06/2019   Migraines 08/06/2019   Osteoarthritis 08/06/2019   Folate deficiency 03/03/2019   Morbid obesity with BMI of 40.0-44.9, adult (HCC) 06/05/2018   Status post right rotator cuff repair 06/05/2018   Degenerative joint disease of right shoulder 05/24/2018   Degenerative tear of glenoid labrum, right 05/24/2018   Injury of tendon of long head of right biceps 05/24/2018   Nontraumatic complete tear of right rotator cuff 05/14/2018   Rotator cuff tendinitis, right 05/14/2018   Iron  deficiency anemia 01/15/2018   B12 deficiency 09/07/2017   Microalbuminuric diabetic nephropathy (HCC) 09/07/2017   Primary osteoarthritis of right knee 02/19/2015   Restless leg syndrome 10/25/2012   Hyperlipidemia with target  LDL less than 100 05/20/2012    PCP: Lauraine Lamarr Leak, NP  REFERRING PROVIDER: Krystal Doyne, PA-C  REFERRING DIAG:  [phone] (ICD-10-CM) - Bursitis of left shoulder  M50.30 (ICD-10-CM) - Other cervical disc degeneration, unspecified cervical region  M54.2 (ICD-10-CM) - Cervicalgia    RATIONALE FOR EVALUATION AND TREATMENT: Rehabilitation  THERAPY DIAG: Other cervical disc degeneration,  unspecified cervical region  Left shoulder pain, unspecified chronicity  Cervicalgia  Muscle weakness (generalized)  ONSET DATE: Last 3 months, progressively worsening   FOLLOW-UP APPT SCHEDULED WITH REFERRING PROVIDER: No   PERTINENT HISTORY: Pt  is a 63 year old female referred for L upper quarter pain with Hx of bursitis and cervical DDD.   Atraumatic onset of L shoulder/paracervical pain. Pt reports having notable pain along anterior deltopectoral groove initially, more recently along posterior cuff/posterior deltoid region. Pt reports notable challenge with folding blanket and with reaching L arm forward - sensation of catching. Pain with turning arm outward. Hx of injections in shoulder with short-term benefit. Pt reports trigger point injections with short-term relief of occipital neuralgia/headaches, but these came back with severe intensity. (-) rheumatoid factor. Pt reports doing okay with donning/doffing bra and grooming/self-care ADLs. Hx of R RCR 2019. Pt has migraine Hx and is awaiting botox treatment.   PAIN:  Pain Intensity: Present: 3/10, Best: 0/10, Worst: 10/10 Pain location: Pain into L upper trap, L posterior cuff/posterior deltoid Pain Quality: sharp pain intermittently, dull pain at rest Radiating: Yes , intermittent periscapular pain and pain sometimes to R side also  Numbness/Tingling: No Focal Weakness: Yes, difficulty gripping, aching in her hands  Aggravating factors: forward reaching, outward reaching/external rotation, folding blankets, reaching out of driver's window Relieving factors: not moving, TENS unit 24-hour pain behavior: worse later in day  History of prior shoulder or neck/shoulder injury, pain, surgery, or therapy: No, only for contralateral shoulder Hx of RCR  Falls: Has patient fallen in last 6 months? No, Number of falls: N/A Dominant hand: right  Imaging: No   Red flags (personal history of cancer, chills/fever, night sweats, nausea,  vomiting, unrelenting pain, unexplained weight gain/loss): Positive for nausea only   -Nausea with migraines    PRECAUTIONS: None  WEIGHT BEARING RESTRICTIONS: No  FALLS: Has patient fallen in last 6 months? 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   Hobbies: Walking exercise after dinner, sewing, genealogy online  Patient Goals: Reduce pain, strengthen upper limb     OBJECTIVE (data from initial evaluation unless otherwise dated):   Patient Surveys  QuickDASH: 47.7%  Posture Rounded shoulders/protracted scapulae, mild kyphotic posturing  Cervical Screen AROM: WFL and painless with overpressure in all planes Spurlings A (ipsilateral lateral flexion/axial compression): R: Negative (localized pain R paracervical) L: Negative (pain L occipital region) Distraction/compression: Negative Repeated movement: No centralization or peripheralization with protraction or retraction Hoffman Sign (cervical cord compression): R: Negative L: Negative  AROM AROM (Normal range in degrees) AROM 07/10/24  Cervical  Flexion (50) 40  Extension (80) 53  Right lateral flexion (45) 43  Left lateral flexion (45) 35*  Right rotation (85) 75  Left rotation (85) 68   Right Left  Shoulder    Flexion 173 163  Extension    Abduction WNL 172  External Rotation 75 66  Internal Rotation WNL 67  Hands Behind Head    Hands Behind Back        Elbow    Flexion    Extension    Pronation    Supination    (* = pain; Blank rows = not tested)  UE MMT: MMT (out of 5) Right 07/10/24 Left 07/10/24      Shoulder   Flexion 4 4-*  Extension    Abduction 4-* 3+*  External rotation 4- 4-  Internal rotation 4+ 4+*  Horizontal abduction    Horizontal adduction     Lower Trapezius    Rhomboids        Elbow  Flexion 5 5  Extension 5 5  Pronation    Supination        (* = pain; Blank rows = not tested)  Sensation Grossly intact to light touch bilateral UE as determined by testing dermatomes C2-T2. Proprioception and hot/cold testing deferred on this date.  Reflexes R/L Biceps (L3/4): 2+/2+  Triceps (S1/2): 2+/2+  Brachioradialis: 2+/2  Palpation Location LEFT  RIGHT           Subocciptials    Cervical paraspinals 1   Upper Trapezius 2   Levator Scapulae    Rhomboid Major/Minor    Sternoclavicular joint    Acromioclavicular joint 1   Coracoid process 1   Long head of biceps 2   Supraspinatus 2   Infraspinatus 2   Subscapularis    Teres Minor    Teres Major    Pectoralis Major    Pectoralis Minor    Anterior Deltoid 1   Lateral Deltoid 1   Posterior Deltoid 1   Latissimus Dorsi    Sternocleidomastoid    (Blank rows = not tested) Graded on 0-4 scale (0 = no pain, 1 = pain, 2 = pain with wincing/grimacing/flinching, 3 = pain with withdrawal, 4 = unwilling to allow palpation), (Blank rows = not tested)    SPECIAL TESTS Rotator Cuff  Drop Arm Test: Negative Painful Arc (Pain from 60 to 120 degrees scaption): Negative Infraspinatus Muscle Test: Negative  Subacromial Impingement Hawkins-Kennedy: Negative Neer (Block scapula, PROM flexion): Negative Painful Arc (Pain from 60 to 120 degrees scaption): Negative Empty Can: Negative (less painful in internally rotated position) External Rotation Resistance: Negative Horizontal Adduction: Negative Scapular Assist: Positive   Bicep Tendon Pathology Speed (shoulder flexion to 90, external rotation, full elbow extension, and forearm supination with resistance: Positive Yergason's (resisted shoulder ER and supination/biceps tendon pathology): Positive   Accessory Motions/Glides (updated 07/15/24) Glenohumeral: WNL with no notable mobility deficits Scapulothoracic: Mild  hypomobility with protraction and upward rotation      TODAY'S TREATMENT     08/05/2024   SUBJECTIVE STATEMENT:   Patient reports 2/10 pain at arrival. Patient reports migraine symptoms this AM - primarily feeling HA without aura or sensitivity to sensory stimulation. Patient reports icing her shoulder after last visit - only time she has had to do date. Patient reports doing okay with updated HEP from last week.     Therapeutic Exercise - for improved soft tissue flexibility and extensibility as needed for ROM, improved strength as needed to improve capacity for overhead activity and lifting   Upper body ergometer, 2 minutes forward, 2 minutes backward - for tissue warm-up to improve muscle performance, improved soft tissue mobility/extensibility - subjective obtained during this time  Sidelying external rotation, 3-lb weight; 2 x 10   -pain-free ROM only  Supine shoulder flexion with 1-lb  Dbell; 2x10  PATIENT EDUCATION: HEP reviewed. Discussed expected progression with PT.    *not today* Supine shoulder flexion AROM; 1 x 12 Theraband external rotation isometric walkout, Red Tband; 2 x 10 Theraband flexion isometric walkout, Red Tband; 2 x 10 Theraband internal rotation isometric walkout, Red Tband; 2 x 10 Shoulder isometrics, at wall with folded towel; flexion, ER; reviewed     Manual Therapy - for symptom modulation, soft tissue sensitivity and mobility, joint mobility, ROM   Suboccipital release and upper cervical distraction with; x 8 minutes  L shoulder PROM check: no notable motion loss in any planes, no pain with end-range elevation  *not today* Scapulothoracic mobilization, focus on protraction and upward rotation; 1 x 30 sec bouts each;   -excellent scapular mobility noted today    Neuromuscular Re-education - soft tissue work and mechanical stimulation for desensitization/neuromodulatory effect; for scapular control/scapulothoracic stability, postural  re-edu  STM/DTM/TPR: L anterior deltoid and pec major, bilat upper trapezius; x 5 minutes  Bilat UT DTM/TPR; x 5 min  High row; 2 x 10, Green Tband; 2 x 10, 3 sec hold   Standing mid-row with Blue Tband; 2 x 12, 3 sec hold  -tactile cueing for scap retraction/depression   Serratus slide at wall with foam roller; 2 x10  -yellow Tband around wrists for posterior cuff activation with serratus slide   *not today* Rhythmic stabilization; x 3 min Serratus punch, supine; 2 x 10, with 4-lb Dbells    PATIENT EDUCATION:  Education details: see above for patient education details Person educated: Patient Education method: Explanation, demonstration, handouts Education comprehension: verbalized understanding and returned demonstration   HOME EXERCISE PROGRAM:  Access Code: [email or web address] URL: [email or web address] Date: 07/29/2024 Prepared by: Venetia Endo  Exercises - Standing Shoulder Posterior Capsule Stretch  - 2 x daily - 7 x weekly - 3 sets - 30sec hold - Seated Upper Trapezius Stretch  - 2 x daily - 7 x weekly - 3 sets - 30sec hold - Shoulder External Rotation Reactive Isometrics  - 2 x daily - 7 x weekly - 2 sets - 10 reps - Standing Shoulder Flexion Reactive Isometrics with Elbow Extended  - 2 x daily - 7 x weekly - 2 sets - 10 reps - Standing Shoulder Row with Anchored Resistance  - 2 x daily - 7 x weekly - 2 sets - 10 reps   ASSESSMENT:  CLINICAL IMPRESSION: Patient has improved tolerance of resisted long-lever forward flexion versus last week and is able to continue with higher-level periscapular isotonics and serratus activation work without notable pain. Pt has normal L shoulder PROM in all planes and no pain with end-range shoulder elevation. Pt has current deficits in: L shoulder AROM, dec shoulder flexor/abductor and posterior cuff strength, postural dysfunction/protracted and anteriorly tilted scapulae, and sensitivity along L bicipital tendon  region/UT/supraspinatus/infraspinatus. Pt will continue to benefit from skilled PT services to address deficits and improve function.  OBJECTIVE IMPAIRMENTS: decreased ROM, decreased strength, impaired flexibility, impaired UE functional use, postural dysfunction, and pain.   ACTIVITY LIMITATIONS: carrying, lifting, and reach over head  PARTICIPATION LIMITATIONS: meal prep and community activity  PERSONAL FACTORS: Past/current experiences, Time since onset of injury/illness/exacerbation, and 3+ comorbidities: (anemia, anxiety, atherosclerosis, asthma, Type 2 DM, HTN, OA) are also affecting patient's functional outcome.   REHAB POTENTIAL: Good  CLINICAL DECISION MAKING: Evolving/moderate complexity  EVALUATION COMPLEXITY: Moderate   GOALS: Goals reviewed with patient? Yes  SHORT TERM GOALS: Target date: 08/01/2024  Pt will be independent with  HEP to improve strength and decrease shoulder pain to improve pain-free function at home and work. Baseline: 07/10/24: Baseline HEP initiated Goal status: INITIAL   LONG TERM GOALS: Target date: 08/29/2024  Pt will have L shoulder AROM within 5 degrees of contralateral upper limb or greater for all planes of motion tested without reproduction of pain as needed for reaching, household chores, and lifting tasks.  Baseline:  07/10/24: Deficit relative to RUE with flexion, abduction, ER.  Goal status: INITIAL  2.  Pt will decrease worst shoulder pain by at least 3 points on the NPRS in order to demonstrate clinically significant reduction in shoulder pain. Baseline: 07/10/24: 10/10 at worst  Goal status: INITIAL  3.  Pt will decrease quick DASH score by at least 8% in order to demonstrate clinically significant reduction in disability related to shoulder pain        Baseline: 07/10/24: 47.7% Goal status: INITIAL  4. Pt will increase strength to at least 4/5 or greater MMT grade in order to demonstrate improvement in strength and function for L  shoulder/L upper limb      Baseline: 07/10/24: L shoulder MMTs 3+ to 4- with exception of strong and painful IR.  Goal status: INITIAL   PLAN: PT FREQUENCY: 1-2x/week  PT DURATION: 6 weeks  PLANNED INTERVENTIONS: Therapeutic exercises, Therapeutic activity, Neuromuscular re-education, Balance training, Gait training, Patient/Family education, Self Care, Joint mobilization, Joint manipulation, Vestibular training, Canalith repositioning, Orthotic/Fit training, DME instructions, Dry Needling, Electrical stimulation, Spinal manipulation, Spinal mobilization, Cryotherapy, Moist heat, Taping, Traction, Ultrasound, Ionotophoresis 4mg /ml Dexamethasone , Manual therapy, and Re-evaluation.  PLAN FOR NEXT SESSION: Continue with scapular control drills. Continue isometrics for LHB and external rotators. Periscapular isotonics.     Venetia Endo, PT, DPT #[id number]  Venetia ONEIDA Endo, PT 08/05/2024, 7:33 AM

## 2024-08-06 NOTE — Therapy (Unsigned)
 OUTPATIENT PHYSICAL THERAPY SHOULDER/UPPER QUARTER TREATMENT  Patient Name: Andrea Zhang MRN: [id number] DOB:03-May-1961, 63 y.o., female Today's Date: 08/06/2024  END OF SESSION:     Past Medical History:  Diagnosis Date   Anemia    iron  deficiency and b12 deficiency   Anxiety    Aortic atherosclerosis    Arthritis    Asthma    Diabetes mellitus without complication (HCC)    Fasciitis    left foot   GERD (gastroesophageal reflux disease)    History of kidney stones    Hypercholesterolemia    Hypertension    Migraines    MIGRAINES HAVE IMPROVED SINCE RECEIVING IRON    Pneumonia    Primary osteoarthritis of right knee    Tachycardia    Past Surgical History:  Procedure Laterality Date   CHOLECYSTECTOMY  2004   COLONOSCOPY WITH ESOPHAGOGASTRODUODENOSCOPY (EGD)  02/2018   ESOPHAGOGASTRODUODENOSCOPY N/A 10/16/2020   Procedure: ESOPHAGOGASTRODUODENOSCOPY (EGD);  Surgeon: Maryruth Ole DASEN, MD;  Location: [facility] ENDOSCOPY;  Service: Endoscopy;  Laterality: N/A;   ESOPHAGOGASTRODUODENOSCOPY (EGD) WITH PROPOFOL  N/A 06/06/2016   Procedure: ESOPHAGOGASTRODUODENOSCOPY (EGD) WITH PROPOFOL ;  Surgeon: Lamar DASEN Holmes, MD;  Location: [facility] ENDOSCOPY;  Service: Endoscopy;  Laterality: N/A;   EXTRACORPOREAL SHOCK WAVE LITHOTRIPSY  2010   JOINT REPLACEMENT  2013   LT TKR   SAVORY DILATION N/A 06/06/2016   Procedure: SAVORY DILATION;  Surgeon: Lamar DASEN Holmes, MD;  Location: [facility] ENDOSCOPY;  Service: Endoscopy;  Laterality: N/A;   SAVORY DILATION  02/2018   SHOULDER ARTHROSCOPY WITH OPEN ROTATOR CUFF REPAIR Right 05/24/2018   Procedure: right shoulder arthroscopy, extensive arthroscopic debridement, decompression, open rotator cuff repair, biceps tenodesis;  Surgeon: Edie Norleen PARAS, MD;  Location: [facility] ORS;  Service: Orthopedics;  Laterality: Right;   TONSILLECTOMY  1978   TOTAL KNEE ARTHROPLASTY Right 08/15/2023   Procedure: TOTAL KNEE ARTHROPLASTY;  Surgeon: Edie Norleen PARAS, MD;   Location: [facility] ORS;  Service: Orthopedics;  Laterality: Right;   Patient Active Problem List   Diagnosis Date Noted   Status post total knee replacement using cement, right 08/15/2023   Coronary artery calcification 09/09/2019   Aortic atherosclerosis 09/09/2019   Allergic rhinitis 08/06/2019   Asthma 08/06/2019   Diabetes mellitus, type 2 (HCC) 08/06/2019   Dysphagia 08/06/2019   GERD (gastroesophageal reflux disease) 08/06/2019   Hypercalcemia 08/06/2019   Hypertension 08/06/2019   Migraines 08/06/2019   Osteoarthritis 08/06/2019   Folate deficiency 03/03/2019   Morbid obesity with BMI of 40.0-44.9, adult (HCC) 06/05/2018   Status post right rotator cuff repair 06/05/2018   Degenerative joint disease of right shoulder 05/24/2018   Degenerative tear of glenoid labrum, right 05/24/2018   Injury of tendon of long head of right biceps 05/24/2018   Nontraumatic complete tear of right rotator cuff 05/14/2018   Rotator cuff tendinitis, right 05/14/2018   Iron  deficiency anemia 01/15/2018   B12 deficiency 09/07/2017   Microalbuminuric diabetic nephropathy (HCC) 09/07/2017   Primary osteoarthritis of right knee 02/19/2015   Restless leg syndrome 10/25/2012   Hyperlipidemia with target LDL less than 100 05/20/2012    PCP: Lauraine Lamarr Leak, NP  REFERRING PROVIDER: Krystal Doyne, PA-C  REFERRING DIAG:  [phone] (ICD-10-CM) - Bursitis of left shoulder  M50.30 (ICD-10-CM) - Other cervical disc degeneration, unspecified cervical region  M54.2 (ICD-10-CM) - Cervicalgia    RATIONALE FOR EVALUATION AND TREATMENT: Rehabilitation  THERAPY DIAG: Left shoulder pain, unspecified chronicity  Other cervical disc degeneration, unspecified cervical region  Cervicalgia  Muscle weakness (generalized)  ONSET DATE: Last 3 months, progressively worsening   FOLLOW-UP APPT SCHEDULED WITH REFERRING PROVIDER: No   PERTINENT HISTORY: Pt  is a 63 year old female referred for L upper quarter pain  with Hx of bursitis and cervical DDD.   Atraumatic onset of L shoulder/paracervical pain. Pt reports having notable pain along anterior deltopectoral groove initially, more recently along posterior cuff/posterior deltoid region. Pt reports notable challenge with folding blanket and with reaching L arm forward - sensation of catching. Pain with turning arm outward. Hx of injections in shoulder with short-term benefit. Pt reports trigger point injections with short-term relief of occipital neuralgia/headaches, but these came back with severe intensity. (-) rheumatoid factor. Pt reports doing okay with donning/doffing bra and grooming/self-care ADLs. Hx of R RCR 2019. Pt has migraine Hx and is awaiting botox treatment.   PAIN:  Pain Intensity: Present: 3/10, Best: 0/10, Worst: 10/10 Pain location: Pain into L upper trap, L posterior cuff/posterior deltoid Pain Quality: sharp pain intermittently, dull pain at rest Radiating: Yes , intermittent periscapular pain and pain sometimes to R side also  Numbness/Tingling: No Focal Weakness: Yes, difficulty gripping, aching in her hands  Aggravating factors: forward reaching, outward reaching/external rotation, folding blankets, reaching out of driver's window Relieving factors: not moving, TENS unit 24-hour pain behavior: worse later in day  History of prior shoulder or neck/shoulder injury, pain, surgery, or therapy: No, only for contralateral shoulder Hx of RCR  Falls: Has patient fallen in last 6 months? No, Number of falls: N/A Dominant hand: right  Imaging: No   Red flags (personal history of cancer, chills/fever, night sweats, nausea, vomiting, unrelenting pain, unexplained weight gain/loss): Positive for nausea only   -Nausea with migraines    PRECAUTIONS: None  WEIGHT BEARING RESTRICTIONS: No  FALLS: Has patient fallen in last 6 months? 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   Hobbies: Walking exercise after dinner, sewing, genealogy online  Patient Goals: Reduce pain, strengthen upper limb     OBJECTIVE (data from initial evaluation unless otherwise dated):   Patient Surveys  QuickDASH: 47.7%  Posture Rounded shoulders/protracted scapulae, mild kyphotic posturing  Cervical Screen AROM: WFL and painless with overpressure in all planes Spurlings A (ipsilateral lateral flexion/axial compression): R: Negative (localized pain R paracervical) L: Negative (pain L occipital region) Distraction/compression: Negative Repeated movement: No centralization or peripheralization with protraction or retraction Hoffman Sign (cervical cord compression): R: Negative L: Negative  AROM AROM (Normal range in degrees) AROM 07/10/24  Cervical  Flexion (50) 40  Extension (80) 53  Right lateral flexion (45) 43  Left lateral flexion (45) 35*  Right rotation (85) 75  Left rotation (85) 68   Right Left  Shoulder    Flexion 173 163  Extension    Abduction WNL 172  External Rotation 75 66  Internal Rotation WNL 67  Hands Behind Head    Hands Behind Back        Elbow    Flexion    Extension    Pronation    Supination    (* = pain; Blank rows = not tested)  UE MMT: MMT (out of 5) Right 07/10/24 Left 07/10/24      Shoulder   Flexion 4 4-*  Extension    Abduction 4-*  3+*  External rotation 4- 4-  Internal rotation 4+ 4+*  Horizontal abduction    Horizontal adduction    Lower Trapezius    Rhomboids        Elbow  Flexion 5 5  Extension 5 5  Pronation    Supination        (* = pain; Blank rows = not tested)  Sensation Grossly intact to light touch bilateral UE as determined by testing dermatomes C2-T2. Proprioception and hot/cold testing deferred on this  date.  Reflexes R/L Biceps (L3/4): 2+/2+  Triceps (S1/2): 2+/2+  Brachioradialis: 2+/2  Palpation Location LEFT  RIGHT           Subocciptials    Cervical paraspinals 1   Upper Trapezius 2   Levator Scapulae    Rhomboid Major/Minor    Sternoclavicular joint    Acromioclavicular joint 1   Coracoid process 1   Long head of biceps 2   Supraspinatus 2   Infraspinatus 2   Subscapularis    Teres Minor    Teres Major    Pectoralis Major    Pectoralis Minor    Anterior Deltoid 1   Lateral Deltoid 1   Posterior Deltoid 1   Latissimus Dorsi    Sternocleidomastoid    (Blank rows = not tested) Graded on 0-4 scale (0 = no pain, 1 = pain, 2 = pain with wincing/grimacing/flinching, 3 = pain with withdrawal, 4 = unwilling to allow palpation), (Blank rows = not tested)    SPECIAL TESTS Rotator Cuff  Drop Arm Test: Negative Painful Arc (Pain from 60 to 120 degrees scaption): Negative Infraspinatus Muscle Test: Negative  Subacromial Impingement Hawkins-Kennedy: Negative Neer (Block scapula, PROM flexion): Negative Painful Arc (Pain from 60 to 120 degrees scaption): Negative Empty Can: Negative (less painful in internally rotated position) External Rotation Resistance: Negative Horizontal Adduction: Negative Scapular Assist: Positive   Bicep Tendon Pathology Speed (shoulder flexion to 90, external rotation, full elbow extension, and forearm supination with resistance: Positive Yergason's (resisted shoulder ER and supination/biceps tendon pathology): Positive   Accessory Motions/Glides (updated 07/15/24) Glenohumeral: WNL with no notable mobility deficits Scapulothoracic: Mild hypomobility with protraction and upward rotation      TODAY'S TREATMENT     08/06/2024   SUBJECTIVE STATEMENT:   Patient reports 2/10 pain at arrival. Patient reports migraine symptoms this AM - primarily feeling HA without aura or sensitivity to sensory stimulation. Patient reports icing her  shoulder after last visit - only time she has had to do date. Patient reports doing okay with updated HEP from last week.     Therapeutic Exercise - for improved soft tissue flexibility and extensibility as needed for ROM, improved strength as needed to improve capacity for overhead activity and lifting   Upper body ergometer, 2 minutes forward, 2 minutes backward - for tissue warm-up to improve muscle performance, improved soft tissue mobility/extensibility - subjective obtained during this time  Sidelying external rotation, 3-lb weight; 2 x 10   -pain-free ROM only  Supine shoulder flexion with 1-lb Dbell; 2x10  PATIENT EDUCATION: HEP reviewed. Discussed expected progression with PT.    *not today* Supine shoulder flexion AROM; 1 x 12 Theraband external rotation isometric walkout, Red Tband; 2 x 10 Theraband flexion isometric walkout, Red Tband; 2 x 10 Theraband internal rotation isometric walkout, Red Tband; 2 x 10 Shoulder isometrics, at wall with folded towel; flexion, ER; reviewed     Manual Therapy - for symptom modulation, soft tissue sensitivity and mobility,  joint mobility, ROM   Suboccipital release and upper cervical distraction with; x 8 minutes  L shoulder PROM check: no notable motion loss in any planes, no pain with end-range elevation  *not today* Scapulothoracic mobilization, focus on protraction and upward rotation; 1 x 30 sec bouts each;   -excellent scapular mobility noted today    Neuromuscular Re-education - soft tissue work and mechanical stimulation for desensitization/neuromodulatory effect; for scapular control/scapulothoracic stability, postural re-edu  STM/DTM/TPR: L anterior deltoid and pec major, bilat upper trapezius; x 5 minutes  Bilat UT DTM/TPR; x 5 min  High row; 2 x 10, Green Tband; 2 x 10, 3 sec hold   Standing mid-row with Blue Tband; 2 x 12, 3 sec hold  -tactile cueing for scap retraction/depression   Serratus slide at wall with  foam roller; 2 x10  -yellow Tband around wrists for posterior cuff activation with serratus slide   *not today* Rhythmic stabilization; x 3 min Serratus punch, supine; 2 x 10, with 4-lb Dbells    PATIENT EDUCATION:  Education details: see above for patient education details Person educated: Patient Education method: Explanation, demonstration, handouts Education comprehension: verbalized understanding and returned demonstration   HOME EXERCISE PROGRAM:  Access Code: [email or web address] URL: [email or web address] Date: 07/29/2024 Prepared by: Venetia Endo  Exercises - Standing Shoulder Posterior Capsule Stretch  - 2 x daily - 7 x weekly - 3 sets - 30sec hold - Seated Upper Trapezius Stretch  - 2 x daily - 7 x weekly - 3 sets - 30sec hold - Shoulder External Rotation Reactive Isometrics  - 2 x daily - 7 x weekly - 2 sets - 10 reps - Standing Shoulder Flexion Reactive Isometrics with Elbow Extended  - 2 x daily - 7 x weekly - 2 sets - 10 reps - Standing Shoulder Row with Anchored Resistance  - 2 x daily - 7 x weekly - 2 sets - 10 reps   ASSESSMENT:  CLINICAL IMPRESSION: Patient has improved tolerance of resisted long-lever forward flexion versus last week and is able to continue with higher-level periscapular isotonics and serratus activation work without notable pain. Pt has normal L shoulder PROM in all planes and no pain with end-range shoulder elevation. Pt has current deficits in: L shoulder AROM, dec shoulder flexor/abductor and posterior cuff strength, postural dysfunction/protracted and anteriorly tilted scapulae, and sensitivity along L bicipital tendon region/UT/supraspinatus/infraspinatus. Pt will continue to benefit from skilled PT services to address deficits and improve function.  OBJECTIVE IMPAIRMENTS: decreased ROM, decreased strength, impaired flexibility, impaired UE functional use, postural dysfunction, and pain.   ACTIVITY LIMITATIONS: carrying, lifting, and  reach over head  PARTICIPATION LIMITATIONS: meal prep and community activity  PERSONAL FACTORS: Past/current experiences, Time since onset of injury/illness/exacerbation, and 3+ comorbidities: (anemia, anxiety, atherosclerosis, asthma, Type 2 DM, HTN, OA) are also affecting patient's functional outcome.   REHAB POTENTIAL: Good  CLINICAL DECISION MAKING: Evolving/moderate complexity  EVALUATION COMPLEXITY: Moderate   GOALS: Goals reviewed with patient? Yes  SHORT TERM GOALS: Target date: 08/01/2024  Pt will be independent with HEP to improve strength and decrease shoulder pain to improve pain-free function at home and work. Baseline: 07/10/24: Baseline HEP initiated Goal status: INITIAL   LONG TERM GOALS: Target date: 08/29/2024  Pt will have L shoulder AROM within 5 degrees of contralateral upper limb or greater for all planes of motion tested without reproduction of pain as needed for reaching, household chores, and lifting tasks.  Baseline:  07/10/24: Deficit relative to RUE with flexion, abduction,  ER.  Goal status: INITIAL  2.  Pt will decrease worst shoulder pain by at least 3 points on the NPRS in order to demonstrate clinically significant reduction in shoulder pain. Baseline: 07/10/24: 10/10 at worst  Goal status: INITIAL  3.  Pt will decrease quick DASH score by at least 8% in order to demonstrate clinically significant reduction in disability related to shoulder pain        Baseline: 07/10/24: 47.7% Goal status: INITIAL  4. Pt will increase strength to at least 4/5 or greater MMT grade in order to demonstrate improvement in strength and function for L shoulder/L upper limb      Baseline: 07/10/24: L shoulder MMTs 3+ to 4- with exception of strong and painful IR.  Goal status: INITIAL   PLAN: PT FREQUENCY: 1-2x/week  PT DURATION: 6 weeks  PLANNED INTERVENTIONS: Therapeutic exercises, Therapeutic activity, Neuromuscular re-education, Balance training, Gait training,  Patient/Family education, Self Care, Joint mobilization, Joint manipulation, Vestibular training, Canalith repositioning, Orthotic/Fit training, DME instructions, Dry Needling, Electrical stimulation, Spinal manipulation, Spinal mobilization, Cryotherapy, Moist heat, Taping, Traction, Ultrasound, Ionotophoresis 4mg /ml Dexamethasone , Manual therapy, and Re-evaluation.  PLAN FOR NEXT SESSION: Continue with scapular control drills. Continue isometrics for LHB and external rotators. Periscapular isotonics.     Venetia Endo, PT, DPT #[id number]  Venetia ONEIDA Endo, PT 08/06/2024, 9:21 PM

## 2024-08-07 ENCOUNTER — Ambulatory Visit: Attending: Orthopedic Surgery | Admitting: Physical Therapy

## 2024-08-07 DIAGNOSIS — M5030 Other cervical disc degeneration, unspecified cervical region: Secondary | ICD-10-CM | POA: Insufficient documentation

## 2024-08-07 DIAGNOSIS — M542 Cervicalgia: Secondary | ICD-10-CM | POA: Diagnosis present

## 2024-08-07 DIAGNOSIS — M25512 Pain in left shoulder: Secondary | ICD-10-CM | POA: Insufficient documentation

## 2024-08-07 DIAGNOSIS — M6281 Muscle weakness (generalized): Secondary | ICD-10-CM | POA: Diagnosis present

## 2024-08-12 ENCOUNTER — Encounter: Payer: Self-pay | Admitting: Physical Therapy

## 2024-08-12 ENCOUNTER — Ambulatory Visit: Admitting: Physical Therapy

## 2024-08-12 DIAGNOSIS — M5030 Other cervical disc degeneration, unspecified cervical region: Secondary | ICD-10-CM

## 2024-08-12 DIAGNOSIS — M6281 Muscle weakness (generalized): Secondary | ICD-10-CM

## 2024-08-12 DIAGNOSIS — M542 Cervicalgia: Secondary | ICD-10-CM

## 2024-08-12 DIAGNOSIS — M25512 Pain in left shoulder: Secondary | ICD-10-CM | POA: Diagnosis not present

## 2024-08-12 NOTE — Therapy (Signed)
 OUTPATIENT PHYSICAL THERAPY TREATMENT AND PROGRESS NOTE   Dates of reporting period  07/10/24   to   08/12/24   Patient Name: Andrea Zhang MRN: [id number] DOB:06-18-1961, 63 y.o., female Today's Date: 08/12/2024  END OF SESSION:  PT End of Session - 08/12/24 0732     Visit Number 10    Number of Visits 13    Date for Recertification  08/29/24    Authorization Type BCBS 2025, 30 visits    PT Start Time 0730    PT Stop Time 0814    PT Time Calculation (min) 44 min    Behavior During Therapy [facility] for tasks assessed/performed           Past Medical History:  Diagnosis Date   Anemia    iron  deficiency and b12 deficiency   Anxiety    Aortic atherosclerosis    Arthritis    Asthma    Diabetes mellitus without complication (HCC)    Fasciitis    left foot   GERD (gastroesophageal reflux disease)    History of kidney stones    Hypercholesterolemia    Hypertension    Migraines    MIGRAINES HAVE IMPROVED SINCE RECEIVING IRON    Pneumonia    Primary osteoarthritis of right knee    Tachycardia    Past Surgical History:  Procedure Laterality Date   CHOLECYSTECTOMY  2004   COLONOSCOPY WITH ESOPHAGOGASTRODUODENOSCOPY (EGD)  02/2018   ESOPHAGOGASTRODUODENOSCOPY N/A 10/16/2020   Procedure: ESOPHAGOGASTRODUODENOSCOPY (EGD);  Surgeon: Maryruth Ole DASEN, MD;  Location: [facility] ENDOSCOPY;  Service: Endoscopy;  Laterality: N/A;   ESOPHAGOGASTRODUODENOSCOPY (EGD) WITH PROPOFOL  N/A 06/06/2016   Procedure: ESOPHAGOGASTRODUODENOSCOPY (EGD) WITH PROPOFOL ;  Surgeon: Lamar DASEN Holmes, MD;  Location: [facility] ENDOSCOPY;  Service: Endoscopy;  Laterality: N/A;   EXTRACORPOREAL SHOCK WAVE LITHOTRIPSY  2010   JOINT REPLACEMENT  2013   LT TKR   SAVORY DILATION N/A 06/06/2016   Procedure: SAVORY DILATION;  Surgeon: Lamar DASEN Holmes, MD;  Location: [facility] ENDOSCOPY;  Service: Endoscopy;  Laterality: N/A;   SAVORY DILATION  02/2018   SHOULDER ARTHROSCOPY WITH OPEN ROTATOR CUFF REPAIR Right 05/24/2018    Procedure: right shoulder arthroscopy, extensive arthroscopic debridement, decompression, open rotator cuff repair, biceps tenodesis;  Surgeon: Edie Norleen PARAS, MD;  Location: [facility] ORS;  Service: Orthopedics;  Laterality: Right;   TONSILLECTOMY  1978   TOTAL KNEE ARTHROPLASTY Right 08/15/2023   Procedure: TOTAL KNEE ARTHROPLASTY;  Surgeon: Edie Norleen PARAS, MD;  Location: [facility] ORS;  Service: Orthopedics;  Laterality: Right;   Patient Active Problem List   Diagnosis Date Noted   Status post total knee replacement using cement, right 08/15/2023   Coronary artery calcification 09/09/2019   Aortic atherosclerosis 09/09/2019   Allergic rhinitis 08/06/2019   Asthma 08/06/2019   Diabetes mellitus, type 2 (HCC) 08/06/2019   Dysphagia 08/06/2019   GERD (gastroesophageal reflux disease) 08/06/2019   Hypercalcemia 08/06/2019   Hypertension 08/06/2019   Migraines 08/06/2019   Osteoarthritis 08/06/2019   Folate deficiency 03/03/2019   Morbid obesity with BMI of 40.0-44.9, adult (HCC) 06/05/2018   Status post right rotator cuff repair 06/05/2018   Degenerative joint disease of right shoulder 05/24/2018   Degenerative tear of glenoid labrum, right 05/24/2018   Injury of tendon of long head of right biceps 05/24/2018   Nontraumatic complete tear of right rotator cuff 05/14/2018   Rotator cuff tendinitis, right 05/14/2018   Iron  deficiency anemia 01/15/2018   B12 deficiency 09/07/2017   Microalbuminuric diabetic nephropathy (HCC) 09/07/2017   Primary  osteoarthritis of right knee 02/19/2015   Restless leg syndrome 10/25/2012   Hyperlipidemia with target LDL less than 100 05/20/2012    PCP: Lauraine Lamarr Leak, NP  REFERRING PROVIDER: Krystal Doyne, PA-C  REFERRING DIAG:  [phone] (ICD-10-CM) - Bursitis of left shoulder  M50.30 (ICD-10-CM) - Other cervical disc degeneration, unspecified cervical region  M54.2 (ICD-10-CM) - Cervicalgia    RATIONALE FOR EVALUATION AND TREATMENT:  Rehabilitation  THERAPY DIAG: Left shoulder pain, unspecified chronicity  Other cervical disc degeneration, unspecified cervical region  Cervicalgia  Muscle weakness (generalized)  ONSET DATE: Last 3 months, progressively worsening   FOLLOW-UP APPT SCHEDULED WITH REFERRING PROVIDER: No   PERTINENT HISTORY: Pt  is a 63 year old female referred for L upper quarter pain with Hx of bursitis and cervical DDD.   Atraumatic onset of L shoulder/paracervical pain. Pt reports having notable pain along anterior deltopectoral groove initially, more recently along posterior cuff/posterior deltoid region. Pt reports notable challenge with folding blanket and with reaching L arm forward - sensation of catching. Pain with turning arm outward. Hx of injections in shoulder with short-term benefit. Pt reports trigger point injections with short-term relief of occipital neuralgia/headaches, but these came back with severe intensity. (-) rheumatoid factor. Pt reports doing okay with donning/doffing bra and grooming/self-care ADLs. Hx of R RCR 2019. Pt has migraine Hx and is awaiting botox treatment.   PAIN:  Pain Intensity: Present: 3/10, Best: 0/10, Worst: 10/10 Pain location: Pain into L upper trap, L posterior cuff/posterior deltoid Pain Quality: sharp pain intermittently, dull pain at rest Radiating: Yes , intermittent periscapular pain and pain sometimes to R side also  Numbness/Tingling: No Focal Weakness: Yes, difficulty gripping, aching in her hands  Aggravating factors: forward reaching, outward reaching/external rotation, folding blankets, reaching out of driver's window Relieving factors: not moving, TENS unit 24-hour pain behavior: worse later in day  History of prior shoulder or neck/shoulder injury, pain, surgery, or therapy: No, only for contralateral shoulder Hx of RCR  Falls: Has patient fallen in last 6 months? No, Number of falls: N/A Dominant hand: right  Imaging: No   Red flags  (personal history of cancer, chills/fever, night sweats, nausea, vomiting, unrelenting pain, unexplained weight gain/loss): Positive for nausea only   -Nausea with migraines    PRECAUTIONS: None  WEIGHT BEARING RESTRICTIONS: No  FALLS: Has patient fallen in last 6 months? 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   Hobbies: Walking exercise after dinner, sewing, genealogy online  Patient Goals: Reduce pain, strengthen upper limb     OBJECTIVE (data from initial evaluation unless otherwise dated):   Patient Surveys  QuickDASH: 47.7%  Posture Rounded shoulders/protracted scapulae, mild kyphotic posturing  Cervical Screen AROM: WFL and painless with overpressure in all planes Spurlings A (ipsilateral lateral flexion/axial compression): R: Negative (localized pain R paracervical) L: Negative (pain L occipital region) Distraction/compression: Negative Repeated movement: No centralization or peripheralization with protraction or retraction Hoffman Sign (cervical cord compression): R: Negative L: Negative  AROM AROM (Normal range in degrees) AROM 07/10/24 AROM 08/12/24  Cervical   Flexion (50) 40 48  Extension (80) 53 55  Right lateral flexion (45) 43 38  Left lateral flexion (45) 35* 32  Right rotation (85) 75 77  Left rotation (85) 68 67  Right Left Right Left  Shoulder      Flexion 173 163 172 156  Extension      Abduction WNL 172 176 168  External Rotation 75 66 78 83  Internal Rotation WNL 67 WNL 65  Hands Behind Head      Hands Behind Back            Elbow      Flexion      Extension      Pronation      Supination      (* = pain; Blank rows = not tested)  UE MMT: MMT (out of 5) Right 07/10/24 Left 07/10/24 Right 08/12/24  Left 08/12/24        Shoulder     Flexion 4 4-* 4 4-*  Extension      Abduction 4-* 3+* 4* (mild pain) 3+*  External rotation 4- 4- 4 4  Internal rotation 4+ 4+* 4+ 4+* (mild pain)  Horizontal abduction      Horizontal adduction      Lower Trapezius      Rhomboids            Elbow    Flexion 5 5    Extension 5 5    Pronation      Supination            (* = pain; Blank rows = not tested)  Sensation Grossly intact to light touch bilateral UE as determined by testing dermatomes C2-T2. Proprioception and hot/cold testing deferred on this date.  Reflexes R/L Biceps (L3/4): 2+/2+  Triceps (S1/2): 2+/2+  Brachioradialis: 2+/2  Palpation Location LEFT  RIGHT           Subocciptials    Cervical paraspinals 1   Upper Trapezius 2   Levator Scapulae    Rhomboid Major/Minor    Sternoclavicular joint    Acromioclavicular joint 1   Coracoid process 1   Long head of biceps 2   Supraspinatus 2   Infraspinatus 2   Subscapularis    Teres Minor    Teres Major    Pectoralis Major    Pectoralis Minor    Anterior Deltoid 1   Lateral Deltoid 1   Posterior Deltoid 1   Latissimus Dorsi    Sternocleidomastoid    (Blank rows = not tested) Graded on 0-4 scale (0 = no pain, 1 = pain, 2 = pain with wincing/grimacing/flinching, 3 = pain with withdrawal, 4 = unwilling to allow palpation), (Blank rows = not tested)    SPECIAL TESTS Rotator Cuff  Drop Arm Test: Negative Painful Arc (Pain from 60 to 120 degrees scaption): Negative Infraspinatus Muscle Test: Negative  Subacromial Impingement Hawkins-Kennedy: Negative Neer (Block scapula, PROM flexion): Negative Painful Arc (Pain from 60 to 120 degrees scaption): Negative Empty Can: Negative (less painful in internally rotated position) External Rotation Resistance: Negative Horizontal Adduction: Negative Scapular Assist: Positive   Bicep Tendon Pathology Speed (shoulder flexion to 90, external rotation, full elbow extension, and  forearm supination with resistance: Positive Yergason's (resisted shoulder ER and supination/biceps tendon pathology): Positive   Accessory Motions/Glides (updated 07/15/24) Glenohumeral: WNL with no notable mobility deficits Scapulothoracic: Mild hypomobility with protraction and upward rotation      TODAY'S TREATMENT     08/12/2024   SUBJECTIVE STATEMENT:   Patient reports minor pain at arrival, not even a one out of ten. Patient reports having some continuous discomfort in her neck and having to massage this. She reports intermittent difficulty  getting comfortable at night due to shoulder pain. Patient reports 50% improvement to date - she states she needs to be able to handle some weight.    *GOAL UPDATE PERFORMED   Therapeutic Exercise - for improved soft tissue flexibility and extensibility as needed for ROM, improved strength as needed to improve capacity for overhead activity and lifting   Upper body ergometer, 2 minutes forward, 2 minutes backward - for tissue warm-up to improve muscle performance, improved soft tissue mobility/extensibility - subjective obtained during this time  Sidelying external rotation, 3-lb weight; 2 x 10   -pain-free ROM only  Supine shoulder flexion with 1-lb Dbell 1x12  Supine shoulder flexion with 2-lb Dbell 1x12  PATIENT EDUCATION: HEP reviewed. Discussed expected progression with PT. Discussed current progress, POC.    *not today* Supine shoulder flexion AROM; 1 x 12 Theraband external rotation isometric walkout, Red Tband; 2 x 10 Theraband flexion isometric walkout, Red Tband; 2 x 10 Theraband internal rotation isometric walkout, Red Tband; 2 x 10 Shoulder isometrics, at wall with folded towel; flexion, ER; reviewed     Manual Therapy - for symptom modulation, soft tissue sensitivity and mobility, joint mobility, ROM   L shoulder PROM check: no notable motion loss in any planes, no pain with end-range elevation  *not  today* Suboccipital release and upper cervical distraction; x 8 minutes Scapulothoracic mobilization, focus on protraction and upward rotation; 1 x 30 sec bouts each;   -excellent scapular mobility noted today    Neuromuscular Re-education - soft tissue work and mechanical stimulation for desensitization/neuromodulatory effect; for scapular control/scapulothoracic stability, postural re-edu  Rhythmic stabilization, with 3-lb Dbell; 2 x 2 min   STM/DTM/TPR: L anterior deltoid and pec major, bilat upper trapezius; x 5 minutes  Wall slide with pillow case, pushing into ER for posterior cuff isometric; 2 x 10   *next visit* Standing mid-row with Blue Tband; 2 x 12, 3 sec hold  -tactile cueing for scap retraction/depression  Serratus slide at wall with foam roller; 2 x10   -yellow Tband around wrists for posterior cuff activation with serratus slide    *not today* Bilat UT DTM/TPR; x 5 min High row; 2 x 10, Green Tband; 2 x 10, 3 sec hold  Serratus punch, supine; 2 x 10, with 4-lb Dbells    PATIENT EDUCATION:  Education details: see above for patient education details Person educated: Patient Education method: Explanation, demonstration, handouts Education comprehension: verbalized understanding and returned demonstration   HOME EXERCISE PROGRAM:  Access Code: [email or web address] URL: [email or web address] Date: 07/29/2024 Prepared by: Venetia Endo  Exercises - Standing Shoulder Posterior Capsule Stretch  - 2 x daily - 7 x weekly - 3 sets - 30sec hold - Seated Upper Trapezius Stretch  - 2 x daily - 7 x weekly - 3 sets - 30sec hold - Shoulder External Rotation Reactive Isometrics  - 2 x daily - 7 x weekly - 2 sets - 10 reps - Standing Shoulder Flexion Reactive Isometrics with Elbow Extended  - 2 x daily - 7 x weekly - 2 sets - 10 reps - Standing Shoulder Row with Anchored Resistance  - 2 x daily - 7 x weekly - 2 sets - 10 reps   ASSESSMENT:  CLINICAL  IMPRESSION: Patient has made moderate improvement to date with shoulder AROM into ER/IR and MMTs for RTC. She is more limited with long-lever resistance e.g. flexion and abduction. She has attained clinically meaningful improvement in QuickDASH, but still has number consistent with moderate disability  score. She has improved NPRS >  MCID. She has not markedly improved shoulder flexion and abduction AROM, though she had functional AROM at baseline. Pt needs further work on attaining symmetrical AROM for shoulder elevation and restoration of functional strength. Pt has current deficits in: L shoulder AROM, dec shoulder flexor/abductor and posterior cuff strength, postural dysfunction/protracted and anteriorly tilted scapulae, and sensitivity along L bicipital tendon region/UT/supraspinatus/infraspinatus. Pt will continue to benefit from skilled PT services to address deficits and improve function.  OBJECTIVE IMPAIRMENTS: decreased ROM, decreased strength, impaired flexibility, impaired UE functional use, postural dysfunction, and pain.   ACTIVITY LIMITATIONS: carrying, lifting, and reach over head  PARTICIPATION LIMITATIONS: meal prep and community activity  PERSONAL FACTORS: Past/current experiences, Time since onset of injury/illness/exacerbation, and 3+ comorbidities: (anemia, anxiety, atherosclerosis, asthma, Type 2 DM, HTN, OA) are also affecting patient's functional outcome.   REHAB POTENTIAL: Good  CLINICAL DECISION MAKING: Evolving/moderate complexity  EVALUATION COMPLEXITY: Moderate   GOALS: Goals reviewed with patient? Yes  SHORT TERM GOALS: Target date: 08/01/2024  Pt will be independent with HEP to improve strength and decrease shoulder pain to improve pain-free function at home and work. Baseline: 07/10/24: Baseline HEP initiated 08/12/24: Pt is mostly compliant, she states I could do better.  Goal status: PARTIALLY MET    LONG TERM GOALS: Target date: 08/29/2024  Pt will have  L shoulder AROM within 5 degrees of contralateral upper limb or greater for all planes of motion tested without reproduction of pain as needed for reaching, household chores, and lifting tasks.  Baseline:  07/10/24: Deficit relative to RUE with flexion, abduction, ER.  08/12/24: Met for ER and IR; not met for flexion/abduction.  Goal status: ON-GOING  2.  Pt will decrease worst shoulder pain by at least 3 points on the NPRS in order to demonstrate clinically significant reduction in shoulder pain. Baseline: 07/10/24: 10/10 at worst  08/12/24: 6/10 at worst.  Goal status: ACHIEVED   3.  Pt will decrease quick DASH score by at least 8% in order to demonstrate clinically significant reduction in disability related to shoulder pain        Baseline: 07/10/24: 47.7% 08/12/24: 34.09% Goal status: ACHIEVED  4. Pt will increase strength to at least 4/5 or greater MMT grade in order to demonstrate improvement in strength and function for L shoulder/L upper limb      Baseline: 07/10/24: L shoulder MMTs 3+ to 4- with exception of strong and painful IR.  08/12/24: MMTs to 4/5 or greater for ER/IR, not met for flexion/ABD (long-lever resistance) Goal status: ON-GOING   PLAN: PT FREQUENCY: 1-2x/week  PT DURATION: 4 weeks  PLANNED INTERVENTIONS: Therapeutic exercises, Therapeutic activity, Neuromuscular re-education, Balance training, Gait training, Patient/Family education, Self Care, Joint mobilization, Joint manipulation, Vestibular training, Canalith repositioning, Orthotic/Fit training, DME instructions, Dry Needling, Electrical stimulation, Spinal manipulation, Spinal mobilization, Cryotherapy, Moist heat, Taping, Traction, Ultrasound, Ionotophoresis 4mg /ml Dexamethasone , Manual therapy, and Re-evaluation.  PLAN FOR NEXT SESSION: Continue with scapular control drills. Progressive long-lever resistance drills for flexion/ABD, posterior cuff strengthening. Periscapular isotonics.     Venetia Endo, PT, DPT  #[id number]  Venetia ONEIDA Endo, PT 08/12/2024, 8:21 AM

## 2024-08-13 NOTE — Therapy (Unsigned)
 OUTPATIENT PHYSICAL THERAPY TREATMENT   Patient Name: Andrea Zhang MRN: [id number] DOB:10/06/1961, 63 y.o., female Today's Date: 08/14/2024  END OF SESSION:  PT End of Session - 08/14/24 0730     Visit Number 11    Number of Visits 17    Date for Recertification  08/29/24    Authorization Type BCBS 2025, 30 visits    PT Start Time 0731    PT Stop Time 0814    PT Time Calculation (min) 43 min    Behavior During Therapy WFL for tasks assessed/performed           Past Medical History:  Diagnosis Date   Anemia    iron  deficiency and b12 deficiency   Anxiety    Aortic atherosclerosis    Arthritis    Asthma    Diabetes mellitus without complication (HCC)    Fasciitis    left foot   GERD (gastroesophageal reflux disease)    History of kidney stones    Hypercholesterolemia    Hypertension    Migraines    MIGRAINES HAVE IMPROVED SINCE RECEIVING IRON    Pneumonia    Primary osteoarthritis of right knee    Tachycardia    Past Surgical History:  Procedure Laterality Date   CHOLECYSTECTOMY  2004   COLONOSCOPY WITH ESOPHAGOGASTRODUODENOSCOPY (EGD)  02/2018   ESOPHAGOGASTRODUODENOSCOPY N/A 10/16/2020   Procedure: ESOPHAGOGASTRODUODENOSCOPY (EGD);  Surgeon: Maryruth Ole DASEN, MD;  Location: [facility] ENDOSCOPY;  Service: Endoscopy;  Laterality: N/A;   ESOPHAGOGASTRODUODENOSCOPY (EGD) WITH PROPOFOL  N/A 06/06/2016   Procedure: ESOPHAGOGASTRODUODENOSCOPY (EGD) WITH PROPOFOL ;  Surgeon: Lamar DASEN Holmes, MD;  Location: [facility] ENDOSCOPY;  Service: Endoscopy;  Laterality: N/A;   EXTRACORPOREAL SHOCK WAVE LITHOTRIPSY  2010   JOINT REPLACEMENT  2013   LT TKR   SAVORY DILATION N/A 06/06/2016   Procedure: SAVORY DILATION;  Surgeon: Lamar DASEN Holmes, MD;  Location: [facility] ENDOSCOPY;  Service: Endoscopy;  Laterality: N/A;   SAVORY DILATION  02/2018   SHOULDER ARTHROSCOPY WITH OPEN ROTATOR CUFF REPAIR Right 05/24/2018   Procedure: right shoulder arthroscopy, extensive arthroscopic  debridement, decompression, open rotator cuff repair, biceps tenodesis;  Surgeon: Edie Norleen PARAS, MD;  Location: [facility] ORS;  Service: Orthopedics;  Laterality: Right;   TONSILLECTOMY  1978   TOTAL KNEE ARTHROPLASTY Right 08/15/2023   Procedure: TOTAL KNEE ARTHROPLASTY;  Surgeon: Edie Norleen PARAS, MD;  Location: [facility] ORS;  Service: Orthopedics;  Laterality: Right;   Patient Active Problem List   Diagnosis Date Noted   Status post total knee replacement using cement, right 08/15/2023   Coronary artery calcification 09/09/2019   Aortic atherosclerosis 09/09/2019   Allergic rhinitis 08/06/2019   Asthma 08/06/2019   Diabetes mellitus, type 2 (HCC) 08/06/2019   Dysphagia 08/06/2019   GERD (gastroesophageal reflux disease) 08/06/2019   Hypercalcemia 08/06/2019   Hypertension 08/06/2019   Migraines 08/06/2019   Osteoarthritis 08/06/2019   Folate deficiency 03/03/2019   Morbid obesity with BMI of 40.0-44.9, adult (HCC) 06/05/2018   Status post right rotator cuff repair 06/05/2018   Degenerative joint disease of right shoulder 05/24/2018   Degenerative tear of glenoid labrum, right 05/24/2018   Injury of tendon of long head of right biceps 05/24/2018   Nontraumatic complete tear of right rotator cuff 05/14/2018   Rotator cuff tendinitis, right 05/14/2018   Iron  deficiency anemia 01/15/2018   B12 deficiency 09/07/2017   Microalbuminuric diabetic nephropathy (HCC) 09/07/2017   Primary osteoarthritis of right knee 02/19/2015   Restless leg syndrome 10/25/2012   Hyperlipidemia with target LDL  less than 100 05/20/2012    PCP: Lauraine Lamarr Leak, NP  REFERRING PROVIDER: Krystal Doyne, PA-C  REFERRING DIAG:  [phone] (ICD-10-CM) - Bursitis of left shoulder  M50.30 (ICD-10-CM) - Other cervical disc degeneration, unspecified cervical region  M54.2 (ICD-10-CM) - Cervicalgia    RATIONALE FOR EVALUATION AND TREATMENT: Rehabilitation  THERAPY DIAG: Left shoulder pain, unspecified chronicity  Other  cervical disc degeneration, unspecified cervical region  Cervicalgia  Muscle weakness (generalized)  ONSET DATE: Last 3 months, progressively worsening   FOLLOW-UP APPT SCHEDULED WITH REFERRING PROVIDER: No   PERTINENT HISTORY: Pt  is a 63 year old female referred for L upper quarter pain with Hx of bursitis and cervical DDD.   Atraumatic onset of L shoulder/paracervical pain. Pt reports having notable pain along anterior deltopectoral groove initially, more recently along posterior cuff/posterior deltoid region. Pt reports notable challenge with folding blanket and with reaching L arm forward - sensation of catching. Pain with turning arm outward. Hx of injections in shoulder with short-term benefit. Pt reports trigger point injections with short-term relief of occipital neuralgia/headaches, but these came back with severe intensity. (-) rheumatoid factor. Pt reports doing okay with donning/doffing bra and grooming/self-care ADLs. Hx of R RCR 2019. Pt has migraine Hx and is awaiting botox treatment.   PAIN:  Pain Intensity: Present: 3/10, Best: 0/10, Worst: 10/10 Pain location: Pain into L upper trap, L posterior cuff/posterior deltoid Pain Quality: sharp pain intermittently, dull pain at rest Radiating: Yes , intermittent periscapular pain and pain sometimes to R side also  Numbness/Tingling: No Focal Weakness: Yes, difficulty gripping, aching in her hands  Aggravating factors: forward reaching, outward reaching/external rotation, folding blankets, reaching out of driver's window Relieving factors: not moving, TENS unit 24-hour pain behavior: worse later in day  History of prior shoulder or neck/shoulder injury, pain, surgery, or therapy: No, only for contralateral shoulder Hx of RCR  Falls: Has patient fallen in last 6 months? No, Number of falls: N/A Dominant hand: right  Imaging: No   Red flags (personal history of cancer, chills/fever, night sweats, nausea, vomiting, unrelenting  pain, unexplained weight gain/loss): Positive for nausea only   -Nausea with migraines    PRECAUTIONS: None  WEIGHT BEARING RESTRICTIONS: No  FALLS: Has patient fallen in last 6 months? 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   Hobbies: Walking exercise after dinner, sewing, genealogy online  Patient Goals: Reduce pain, strengthen upper limb     OBJECTIVE (data from initial evaluation unless otherwise dated):   Patient Surveys  QuickDASH: 47.7%  Posture Rounded shoulders/protracted scapulae, mild kyphotic posturing  Cervical Screen AROM: WFL Spurlings A (ipsilateral lateral flexion/axial compression): R: Negative (localized pain R paracervical) L: Negative (pain L occipital region) Distraction/compression: Negative Repeated movement: No centralization or peripheralization with protraction or retraction Hoffman Sign (cervical cord compression): R: Negative L: Negative  AROM AROM (Normal range in degrees) AROM 07/10/24 AROM 08/12/24  Cervical   Flexion (50) 40 48  Extension (80) 53 55  Right lateral flexion (45) 43 38  Left lateral flexion (45) 35* 32  Right rotation (85) 75 77  Left rotation (85) 68 67   Right Left Right Left  Shoulder      Flexion 173 163 172 156  Extension  Abduction WNL 172 176 168  External Rotation 75 66 78 83  Internal Rotation WNL 67 WNL 65  Hands Behind Head      Hands Behind Back            Elbow      Flexion      Extension      Pronation      Supination      (* = pain; Blank rows = not tested)  UE MMT: MMT (out of 5) Right 07/10/24 Left 07/10/24 Right 08/12/24 Left 08/12/24        Shoulder     Flexion 4 4-* 4 4-*  Extension      Abduction 4-* 3+* 4* (mild pain) 3+*  External rotation 4-  4- 4 4  Internal rotation 4+ 4+* 4+ 4+* (mild pain)  Horizontal abduction      Horizontal adduction      Lower Trapezius      Rhomboids            Elbow    Flexion 5 5    Extension 5 5    Pronation      Supination            (* = pain; Blank rows = not tested)  Sensation Grossly intact to light touch bilateral UE as determined by testing dermatomes C2-T2. Proprioception and hot/cold testing deferred on this date.  Reflexes R/L Biceps (L3/4): 2+/2+  Triceps (S1/2): 2+/2+  Brachioradialis: 2+/2  Palpation Location LEFT  RIGHT           Subocciptials    Cervical paraspinals 1   Upper Trapezius 2   Levator Scapulae    Rhomboid Major/Minor    Sternoclavicular joint    Acromioclavicular joint 1   Coracoid process 1   Long head of biceps 2   Supraspinatus 2   Infraspinatus 2   Subscapularis    Teres Minor    Teres Major    Pectoralis Major    Pectoralis Minor    Anterior Deltoid 1   Lateral Deltoid 1   Posterior Deltoid 1   Latissimus Dorsi    Sternocleidomastoid    (Blank rows = not tested) Graded on 0-4 scale (0 = no pain, 1 = pain, 2 = pain with wincing/grimacing/flinching, 3 = pain with withdrawal, 4 = unwilling to allow palpation), (Blank rows = not tested)    SPECIAL TESTS Rotator Cuff  Drop Arm Test: Negative Painful Arc (Pain from 60 to 120 degrees scaption): Negative Infraspinatus Muscle Test: Negative  Subacromial Impingement Hawkins-Kennedy: Negative Neer (Block scapula, PROM flexion): Negative Painful Arc (Pain from 60 to 120 degrees scaption): Negative Empty Can: Negative (less painful in internally rotated position) External Rotation Resistance: Negative Horizontal Adduction: Negative Scapular Assist: Positive   Bicep Tendon Pathology Speed (shoulder flexion to 90, external rotation, full elbow extension, and forearm supination with resistance: Positive Yergason's (resisted shoulder ER and supination/biceps tendon pathology):  Positive   Accessory Motions/Glides (updated 07/15/24) Glenohumeral: WNL with no notable mobility deficits Scapulothoracic: Mild hypomobility with protraction and upward rotation      TODAY'S TREATMENT     08/14/2024   SUBJECTIVE STATEMENT:   Patient reports no recent sleep disturbance due to shoulder pain. She reports difficulty sleeping due to positioning. Patient reports minimal pain this AM. Patient reports some HA at arrival - no migraine Sx. Patient reports no major soreness after last visit.    Therapeutic Exercise - for improved soft tissue flexibility and extensibility  as needed for ROM, improved strength as needed to improve capacity for overhead activity and lifting   Upper body ergometer, 2 minutes forward, 2 minutes backward - for tissue warm-up to improve muscle performance, improved soft tissue mobility/extensibility - subjective obtained during this time  Sidelying external rotation, 3-lb weight; 3 x 10   -pain-free ROM only   Supine shoulder flexion with 2-lb Dbell 2 x 12  PATIENT EDUCATION: HEP reviewed. Discussed expected progression with PT. Discussed current progress, POC.    *not today* Supine shoulder flexion AROM; 1 x 12 Theraband external rotation isometric walkout, Red Tband; 2 x 10 Theraband flexion isometric walkout, Red Tband; 2 x 10 Theraband internal rotation isometric walkout, Red Tband; 2 x 10 Shoulder isometrics, at wall with folded towel; flexion, ER; reviewed    Neuromuscular Re-education - soft tissue work and mechanical stimulation for desensitization/neuromodulatory effect; for scapular control/scapulothoracic stability, postural re-edu  Rhythmic stabilization, with 4-lb Dbell; 1 x 2 min   STM/DTM/TPR: Bilat splenius cervicis C3-6, bilat upper trapezius; x 6 minutes  Nautilus lat pulldown; 50-lb, with straight bar; 2 x 10, 3 sec hold with scap retraction/depression  -also working into full overhead motion with eccentric  phase  Standing mid-row with Blue Tband; 2 x 12, 3 sec hold  -tactile cueing for scap retraction/depression    Standing bilat scaption to 90 deg, with 1-lb Dbells; 1 x 10   -increase volume next visit    *not today* Serratus slide at wall with foam roller; 2 x10   -yellow Tband around wrists for posterior cuff activation with serratus slide Wall slide with pillow case, pushing into ER for posterior cuff isometric; 2 x 10 Bilat UT DTM/TPR; x 5 min High row; 2 x 10, Green Tband; 2 x 10, 3 sec hold  Serratus punch, supine; 2 x 10, with 4-lb Dbells       PATIENT EDUCATION:  Education details: see above for patient education details Person educated: Patient Education method: Explanation, demonstration, handouts Education comprehension: verbalized understanding and returned demonstration   HOME EXERCISE PROGRAM:  Access Code: [email or web address] URL: [email or web address] Date: 07/29/2024 Prepared by: Venetia Endo  Exercises - Standing Shoulder Posterior Capsule Stretch  - 2 x daily - 7 x weekly - 3 sets - 30sec hold - Seated Upper Trapezius Stretch  - 2 x daily - 7 x weekly - 3 sets - 30sec hold - Shoulder External Rotation Reactive Isometrics  - 2 x daily - 7 x weekly - 2 sets - 10 reps - Standing Shoulder Flexion Reactive Isometrics with Elbow Extended  - 2 x daily - 7 x weekly - 2 sets - 10 reps - Standing Shoulder Row with Anchored Resistance  - 2 x daily - 7 x weekly - 2 sets - 10 reps   ASSESSMENT:  CLINICAL IMPRESSION: Patient has continuous paracervical pain and intermittent HA without classic migraine symptoms as this time. Primary c/o shoulder and upper arm pain is minimal today, and pt is able to markedly progress with long-lever shoulder flexion isotonics and posterior cuff strengthening work. Pt has current deficits in: L shoulder AROM, dec shoulder flexor/abductor and posterior cuff strength, postural dysfunction/protracted and anteriorly tilted scapulae,  and sensitivity along L bicipital tendon region/UT/supraspinatus/infraspinatus. Pt will continue to benefit from skilled PT services to address deficits and improve function.  OBJECTIVE IMPAIRMENTS: decreased ROM, decreased strength, impaired flexibility, impaired UE functional use, postural dysfunction, and pain.   ACTIVITY LIMITATIONS: carrying, lifting, and reach over head  PARTICIPATION LIMITATIONS: meal prep  and community activity  PERSONAL FACTORS: Past/current experiences, Time since onset of injury/illness/exacerbation, and 3+ comorbidities: (anemia, anxiety, atherosclerosis, asthma, Type 2 DM, HTN, OA) are also affecting patient's functional outcome.   REHAB POTENTIAL: Good  CLINICAL DECISION MAKING: Evolving/moderate complexity  EVALUATION COMPLEXITY: Moderate   GOALS: Goals reviewed with patient? Yes  SHORT TERM GOALS: Target date: 08/01/2024  Pt will be independent with HEP to improve strength and decrease shoulder pain to improve pain-free function at home and work. Baseline: 07/10/24: Baseline HEP initiated 08/12/24: Pt is mostly compliant, she states I could do better.  Goal status: PARTIALLY MET    LONG TERM GOALS: Target date: 08/29/2024  Pt will have L shoulder AROM within 5 degrees of contralateral upper limb or greater for all planes of motion tested without reproduction of pain as needed for reaching, household chores, and lifting tasks.  Baseline:  07/10/24: Deficit relative to RUE with flexion, abduction, ER.  08/12/24: Met for ER and IR; not met for flexion/abduction.  Goal status: ON-GOING  2.  Pt will decrease worst shoulder pain by at least 3 points on the NPRS in order to demonstrate clinically significant reduction in shoulder pain. Baseline: 07/10/24: 10/10 at worst  08/12/24: 6/10 at worst.  Goal status: ACHIEVED   3.  Pt will decrease quick DASH score by at least 8% in order to demonstrate clinically significant reduction in disability related to  shoulder pain        Baseline: 07/10/24: 47.7% 08/12/24: 34.09% Goal status: ACHIEVED  4. Pt will increase strength to at least 4/5 or greater MMT grade in order to demonstrate improvement in strength and function for L shoulder/L upper limb      Baseline: 07/10/24: L shoulder MMTs 3+ to 4- with exception of strong and painful IR.  08/12/24: MMTs to 4/5 or greater for ER/IR, not met for flexion/ABD (long-lever resistance) Goal status: ON-GOING   PLAN: PT FREQUENCY: 1-2x/week  PT DURATION: 4 weeks  PLANNED INTERVENTIONS: Therapeutic exercises, Therapeutic activity, Neuromuscular re-education, Balance training, Gait training, Patient/Family education, Self Care, Joint mobilization, Joint manipulation, Vestibular training, Canalith repositioning, Orthotic/Fit training, DME instructions, Dry Needling, Electrical stimulation, Spinal manipulation, Spinal mobilization, Cryotherapy, Moist heat, Taping, Traction, Ultrasound, Ionotophoresis 4mg /ml Dexamethasone , Manual therapy, and Re-evaluation.  PLAN FOR NEXT SESSION: Continue with scapular control drills. Progressive long-lever resistance drills for flexion/ABD, posterior cuff strengthening. Periscapular isotonics.     Venetia Endo, PT, DPT #[id number]  Venetia ONEIDA Endo, PT 08/14/2024, 10:11 AM

## 2024-08-14 ENCOUNTER — Ambulatory Visit: Admitting: Physical Therapy

## 2024-08-14 DIAGNOSIS — M25512 Pain in left shoulder: Secondary | ICD-10-CM

## 2024-08-14 DIAGNOSIS — M6281 Muscle weakness (generalized): Secondary | ICD-10-CM

## 2024-08-14 DIAGNOSIS — M5030 Other cervical disc degeneration, unspecified cervical region: Secondary | ICD-10-CM

## 2024-08-14 DIAGNOSIS — M542 Cervicalgia: Secondary | ICD-10-CM

## 2024-08-19 ENCOUNTER — Ambulatory Visit: Admitting: Physical Therapy

## 2024-08-19 ENCOUNTER — Encounter: Payer: Self-pay | Admitting: Physical Therapy

## 2024-08-19 DIAGNOSIS — M25512 Pain in left shoulder: Secondary | ICD-10-CM

## 2024-08-19 DIAGNOSIS — M5030 Other cervical disc degeneration, unspecified cervical region: Secondary | ICD-10-CM

## 2024-08-19 DIAGNOSIS — M542 Cervicalgia: Secondary | ICD-10-CM

## 2024-08-19 DIAGNOSIS — M6281 Muscle weakness (generalized): Secondary | ICD-10-CM

## 2024-08-19 NOTE — Therapy (Signed)
 OUTPATIENT PHYSICAL THERAPY TREATMENT   Patient Name: Andrea Zhang MRN: [id number] DOB:[birth year], 63 y.o., female Today's Date: 08/19/2024  END OF SESSION:  PT End of Session - 08/19/24 0727     Visit Number 12    Number of Visits 17    Date for Recertification  08/29/24    Authorization Type BCBS 2025, 30 visits    PT Start Time 0730    PT Stop Time 0816    PT Time Calculation (min) 46 min    Behavior During Therapy WFL for tasks assessed/performed            Past Medical History:  Diagnosis Date   Anemia    iron  deficiency and b12 deficiency   Anxiety    Aortic atherosclerosis    Arthritis    Asthma    Diabetes mellitus without complication (HCC)    Fasciitis    left foot   GERD (gastroesophageal reflux disease)    History of kidney stones    Hypercholesterolemia    Hypertension    Migraines    MIGRAINES HAVE IMPROVED SINCE RECEIVING IRON    Pneumonia    Primary osteoarthritis of right knee    Tachycardia    Past Surgical History:  Procedure Laterality Date   CHOLECYSTECTOMY  2004   COLONOSCOPY WITH ESOPHAGOGASTRODUODENOSCOPY (EGD)  02/2018   ESOPHAGOGASTRODUODENOSCOPY N/A 10/16/2020   Procedure: ESOPHAGOGASTRODUODENOSCOPY (EGD);  Surgeon: Maryruth Ole DASEN, MD;  Location: [facility] ENDOSCOPY;  Service: Endoscopy;  Laterality: N/A;   ESOPHAGOGASTRODUODENOSCOPY (EGD) WITH PROPOFOL  N/A 06/06/2016   Procedure: ESOPHAGOGASTRODUODENOSCOPY (EGD) WITH PROPOFOL ;  Surgeon: Lamar DASEN Holmes, MD;  Location: [facility] ENDOSCOPY;  Service: Endoscopy;  Laterality: N/A;   EXTRACORPOREAL SHOCK WAVE LITHOTRIPSY  2010   JOINT REPLACEMENT  2013   LT TKR   SAVORY DILATION N/A 06/06/2016   Procedure: SAVORY DILATION;  Surgeon: Lamar DASEN Holmes, MD;  Location: [facility] ENDOSCOPY;  Service: Endoscopy;  Laterality: N/A;   SAVORY DILATION  02/2018   SHOULDER ARTHROSCOPY WITH OPEN ROTATOR CUFF REPAIR Right 05/24/2018   Procedure: right shoulder arthroscopy, extensive arthroscopic  debridement, decompression, open rotator cuff repair, biceps tenodesis;  Surgeon: Edie Norleen PARAS, MD;  Location: [facility] ORS;  Service: Orthopedics;  Laterality: Right;   TONSILLECTOMY  1978   TOTAL KNEE ARTHROPLASTY Right 08/15/2023   Procedure: TOTAL KNEE ARTHROPLASTY;  Surgeon: Edie Norleen PARAS, MD;  Location: [facility] ORS;  Service: Orthopedics;  Laterality: Right;   Patient Active Problem List   Diagnosis Date Noted   Status post total knee replacement using cement, right 08/15/2023   Coronary artery calcification 09/09/2019   Aortic atherosclerosis 09/09/2019   Allergic rhinitis 08/06/2019   Asthma 08/06/2019   Diabetes mellitus, type 2 (HCC) 08/06/2019   Dysphagia 08/06/2019   GERD (gastroesophageal reflux disease) 08/06/2019   Hypercalcemia 08/06/2019   Hypertension 08/06/2019   Migraines 08/06/2019   Osteoarthritis 08/06/2019   Folate deficiency 03/03/2019   Morbid obesity with BMI of 40.0-44.9, adult (HCC) 06/05/2018   Status post right rotator cuff repair 06/05/2018   Degenerative joint disease of right shoulder 05/24/2018   Degenerative tear of glenoid labrum, right 05/24/2018   Injury of tendon of long head of right biceps 05/24/2018   Nontraumatic complete tear of right rotator cuff 05/14/2018   Rotator cuff tendinitis, right 05/14/2018   Iron  deficiency anemia 01/15/2018   B12 deficiency 09/07/2017   Microalbuminuric diabetic nephropathy (HCC) 09/07/2017   Primary osteoarthritis of right knee 02/19/2015   Restless leg syndrome 10/25/2012   Hyperlipidemia with target  LDL less than 100 05/20/2012    PCP: Lauraine Lamarr Leak, NP  REFERRING PROVIDER: Krystal Doyne, PA-C  REFERRING DIAG:  [phone] (ICD-10-CM) - Bursitis of left shoulder  M50.30 (ICD-10-CM) - Other cervical disc degeneration, unspecified cervical region  M54.2 (ICD-10-CM) - Cervicalgia    RATIONALE FOR EVALUATION AND TREATMENT: Rehabilitation  THERAPY DIAG: Left shoulder pain, unspecified chronicity  Other  cervical disc degeneration, unspecified cervical region  Cervicalgia  Muscle weakness (generalized)  ONSET DATE: Last 3 months, progressively worsening   FOLLOW-UP APPT SCHEDULED WITH REFERRING PROVIDER: No   PERTINENT HISTORY: Pt  is a 63 year old female referred for L upper quarter pain with Hx of bursitis and cervical DDD.   Atraumatic onset of L shoulder/paracervical pain. Pt reports having notable pain along anterior deltopectoral groove initially, more recently along posterior cuff/posterior deltoid region. Pt reports notable challenge with folding blanket and with reaching L arm forward - sensation of catching. Pain with turning arm outward. Hx of injections in shoulder with short-term benefit. Pt reports trigger point injections with short-term relief of occipital neuralgia/headaches, but these came back with severe intensity. (-) rheumatoid factor. Pt reports doing okay with donning/doffing bra and grooming/self-care ADLs. Hx of R RCR 2019. Pt has migraine Hx and is awaiting botox treatment.   PAIN:  Pain Intensity: Present: 3/10, Best: 0/10, Worst: 10/10 Pain location: Pain into L upper trap, L posterior cuff/posterior deltoid Pain Quality: sharp pain intermittently, dull pain at rest Radiating: Yes , intermittent periscapular pain and pain sometimes to R side also  Numbness/Tingling: No Focal Weakness: Yes, difficulty gripping, aching in her hands  Aggravating factors: forward reaching, outward reaching/external rotation, folding blankets, reaching out of driver's window Relieving factors: not moving, TENS unit 24-hour pain behavior: worse later in day  History of prior shoulder or neck/shoulder injury, pain, surgery, or therapy: No, only for contralateral shoulder Hx of RCR  Falls: Has patient fallen in last 6 months? No, Number of falls: N/A Dominant hand: right  Imaging: No   Red flags (personal history of cancer, chills/fever, night sweats, nausea, vomiting, unrelenting  pain, unexplained weight gain/loss): Positive for nausea only   -Nausea with migraines    PRECAUTIONS: None  WEIGHT BEARING RESTRICTIONS: No  FALLS: Has patient fallen in last 6 months? 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   Hobbies: Walking exercise after dinner, sewing, genealogy online  Patient Goals: Reduce pain, strengthen upper limb     OBJECTIVE (data from initial evaluation unless otherwise dated):   Patient Surveys  QuickDASH: 47.7%  Posture Rounded shoulders/protracted scapulae, mild kyphotic posturing  Cervical Screen AROM: WFL Spurlings A (ipsilateral lateral flexion/axial compression): R: Negative (localized pain R paracervical) L: Negative (pain L occipital region) Distraction/compression: Negative Repeated movement: No centralization or peripheralization with protraction or retraction Hoffman Sign (cervical cord compression): R: Negative L: Negative  AROM AROM (Normal range in degrees) AROM 07/10/24 AROM 08/12/24  Cervical   Flexion (50) 40 48  Extension (80) 53 55  Right lateral flexion (45) 43 38  Left lateral flexion (45) 35* 32  Right rotation (85) 75 77  Left rotation (85) 68 67   Right Left Right Left  Shoulder      Flexion 173 163 172 156  Extension  Abduction WNL 172 176 168  External Rotation 75 66 78 83  Internal Rotation WNL 67 WNL 65  Hands Behind Head      Hands Behind Back            Elbow      Flexion      Extension      Pronation      Supination      (* = pain; Blank rows = not tested)  UE MMT: MMT (out of 5) Right 07/10/24 Left 07/10/24 Right 08/12/24 Left 08/12/24        Shoulder     Flexion 4 4-* 4 4-*  Extension      Abduction 4-* 3+* 4* (mild pain) 3+*  External rotation 4-  4- 4 4  Internal rotation 4+ 4+* 4+ 4+* (mild pain)  Horizontal abduction      Horizontal adduction      Lower Trapezius      Rhomboids            Elbow    Flexion 5 5    Extension 5 5    Pronation      Supination            (* = pain; Blank rows = not tested)  Sensation Grossly intact to light touch bilateral UE as determined by testing dermatomes C2-T2. Proprioception and hot/cold testing deferred on this date.  Reflexes R/L Biceps (L3/4): 2+/2+  Triceps (S1/2): 2+/2+  Brachioradialis: 2+/2  Palpation Location LEFT  RIGHT           Subocciptials    Cervical paraspinals 1   Upper Trapezius 2   Levator Scapulae    Rhomboid Major/Minor    Sternoclavicular joint    Acromioclavicular joint 1   Coracoid process 1   Long head of biceps 2   Supraspinatus 2   Infraspinatus 2   Subscapularis    Teres Minor    Teres Major    Pectoralis Major    Pectoralis Minor    Anterior Deltoid 1   Lateral Deltoid 1   Posterior Deltoid 1   Latissimus Dorsi    Sternocleidomastoid    (Blank rows = not tested) Graded on 0-4 scale (0 = no pain, 1 = pain, 2 = pain with wincing/grimacing/flinching, 3 = pain with withdrawal, 4 = unwilling to allow palpation), (Blank rows = not tested)    SPECIAL TESTS Rotator Cuff  Drop Arm Test: Negative Painful Arc (Pain from 60 to 120 degrees scaption): Negative Infraspinatus Muscle Test: Negative  Subacromial Impingement Hawkins-Kennedy: Negative Neer (Block scapula, PROM flexion): Negative Painful Arc (Pain from 60 to 120 degrees scaption): Negative Empty Can: Negative (less painful in internally rotated position) External Rotation Resistance: Negative Horizontal Adduction: Negative Scapular Assist: Positive   Bicep Tendon Pathology Speed (shoulder flexion to 90, external rotation, full elbow extension, and forearm supination with resistance: Positive Yergason's (resisted shoulder ER and supination/biceps tendon pathology):  Positive   Accessory Motions/Glides (updated 07/15/24) Glenohumeral: WNL with no notable mobility deficits Scapulothoracic: Mild hypomobility with protraction and upward rotation      TODAY'S TREATMENT     08/19/2024   SUBJECTIVE STATEMENT:   Patient reports no notable baseline pain. She reports some left paracervical pain at arrival to PT; she denies notable upper arm pain. She reports improvement with reaching behind into ER to grab seatbelt. Pt reports notable pain with resistance into forward elevation.   Therapeutic Exercise - for improved soft tissue flexibility and extensibility  as needed for ROM, improved strength as needed to improve capacity for overhead activity and lifting   Upper body ergometer, 2 minutes forward, 2 minutes backward - for tissue warm-up to improve muscle performance, improved soft tissue mobility/extensibility - subjective obtained during this time  Sidelying external rotation, 3-lb weight; 3 x 10   -pain-free ROM only   Supine shoulder flexion with 2-lb Dbell 2 x 12  PATIENT EDUCATION: HEP reviewed.    *not today* Supine shoulder flexion AROM; 1 x 12 Theraband external rotation isometric walkout, Red Tband; 2 x 10 Theraband flexion isometric walkout, Red Tband; 2 x 10 Theraband internal rotation isometric walkout, Red Tband; 2 x 10 Shoulder isometrics, at wall with folded towel; flexion, ER; reviewed    Neuromuscular Re-education - soft tissue work and mechanical stimulation for desensitization/neuromodulatory effect; for scapular control/scapulothoracic stability, postural re-edu  Rhythmic stabilization, with 4-lb Dbell; 2 x 2 min   STM/DTM/TPR: Bilat splenius cervicis C3-6, bilat upper trapezius; x 8 minutes   Standing mid-row with Blue Tband; 2 x 12, 3 sec hold  -tactile cueing for scap retraction/depression    Standing bilat scaption to 90 deg, with 2-lb Dbells; 2 x 10       *not today* Nautilus lat pulldown; 50-lb, with straight  bar; 2 x 10, 3 sec hold with scap retraction/depression  -also working into full overhead motion with eccentric phase Serratus slide at wall with foam roller; 2 x10   -yellow Tband around wrists for posterior cuff activation with serratus slide Wall slide with pillow case, pushing into ER for posterior cuff isometric; 2 x 10 Bilat UT DTM/TPR; x 5 min High row; 2 x 10, Green Tband; 2 x 10, 3 sec hold  Serratus punch, supine; 2 x 10, with 4-lb Dbells       PATIENT EDUCATION:  Education details: see above for patient education details Person educated: Patient Education method: Explanation, demonstration, handouts Education comprehension: verbalized understanding and returned demonstration   HOME EXERCISE PROGRAM:  Access Code: [email or web address] URL: [email or web address] Date: 07/29/2024 Prepared by: Venetia Endo  Exercises - Standing Shoulder Posterior Capsule Stretch  - 2 x daily - 7 x weekly - 3 sets - 30sec hold - Seated Upper Trapezius Stretch  - 2 x daily - 7 x weekly - 3 sets - 30sec hold - Shoulder External Rotation Reactive Isometrics  - 2 x daily - 7 x weekly - 2 sets - 10 reps - Standing Shoulder Flexion Reactive Isometrics with Elbow Extended  - 2 x daily - 7 x weekly - 2 sets - 10 reps - Standing Shoulder Row with Anchored Resistance  - 2 x daily - 7 x weekly - 2 sets - 10 reps   ASSESSMENT:  CLINICAL IMPRESSION: Patient fortunately has markedly improved tolerance of AROM into functional ER and normal PROM in all planes. Patient is able to continue progression of posterior cuff strengthening and resisted forward elevation with good tolerance. She has comorbid migraine symptoms and intermittent back pain that does limit her performance with periscapular isotonics. Pt has current deficits in: L shoulder AROM, dec shoulder flexor/abductor and posterior cuff strength, postural dysfunction/protracted and anteriorly tilted scapulae, and sensitivity along L bicipital  tendon region/UT/supraspinatus/infraspinatus. Pt will continue to benefit from skilled PT services to address deficits and improve function.  OBJECTIVE IMPAIRMENTS: decreased ROM, decreased strength, impaired flexibility, impaired UE functional use, postural dysfunction, and pain.   ACTIVITY LIMITATIONS: carrying, lifting, and reach over head  PARTICIPATION LIMITATIONS: meal prep and community activity  PERSONAL FACTORS: Past/current experiences, Time since onset of injury/illness/exacerbation, and 3+ comorbidities: (anemia, anxiety, atherosclerosis, asthma, Type 2 DM, HTN, OA) are also affecting patient's functional outcome.   REHAB POTENTIAL: Good  CLINICAL DECISION MAKING: Evolving/moderate complexity  EVALUATION COMPLEXITY: Moderate   GOALS: Goals reviewed with patient? Yes  SHORT TERM GOALS: Target date: 08/01/2024  Pt will be independent with HEP to improve strength and decrease shoulder pain to improve pain-free function at home and work. Baseline: 07/10/24: Baseline HEP initiated 08/12/24: Pt is mostly compliant, she states I could do better.  Goal status: PARTIALLY MET    LONG TERM GOALS: Target date: 08/29/2024  Pt will have L shoulder AROM within 5 degrees of contralateral upper limb or greater for all planes of motion tested without reproduction of pain as needed for reaching, household chores, and lifting tasks.  Baseline:  07/10/24: Deficit relative to RUE with flexion, abduction, ER.  08/12/24: Met for ER and IR; not met for flexion/abduction.  Goal status: ON-GOING  2.  Pt will decrease worst shoulder pain by at least 3 points on the NPRS in order to demonstrate clinically significant reduction in shoulder pain. Baseline: 07/10/24: 10/10 at worst  08/12/24: 6/10 at worst.  Goal status: ACHIEVED   3.  Pt will decrease quick DASH score by at least 8% in order to demonstrate clinically significant reduction in disability related to shoulder pain        Baseline: 07/10/24:  47.7% 08/12/24: 34.09% Goal status: ACHIEVED  4. Pt will increase strength to at least 4/5 or greater MMT grade in order to demonstrate improvement in strength and function for L shoulder/L upper limb      Baseline: 07/10/24: L shoulder MMTs 3+ to 4- with exception of strong and painful IR.  08/12/24: MMTs to 4/5 or greater for ER/IR, not met for flexion/ABD (long-lever resistance) Goal status: ON-GOING   PLAN: PT FREQUENCY: 1-2x/week  PT DURATION: 4 weeks  PLANNED INTERVENTIONS: Therapeutic exercises, Therapeutic activity, Neuromuscular re-education, Balance training, Gait training, Patient/Family education, Self Care, Joint mobilization, Joint manipulation, Vestibular training, Canalith repositioning, Orthotic/Fit training, DME instructions, Dry Needling, Electrical stimulation, Spinal manipulation, Spinal mobilization, Cryotherapy, Moist heat, Taping, Traction, Ultrasound, Ionotophoresis 4mg /ml Dexamethasone , Manual therapy, and Re-evaluation.  PLAN FOR NEXT SESSION: Continue with scapular control drills. Progressive long-lever resistance drills for flexion/ABD, posterior cuff strengthening. Periscapular isotonics.     Venetia Endo, PT, DPT #[id number]  Venetia ONEIDA Endo, PT 08/19/2024, 8:25 AM

## 2024-08-21 ENCOUNTER — Ambulatory Visit: Admitting: Physical Therapy

## 2024-08-21 DIAGNOSIS — M25512 Pain in left shoulder: Secondary | ICD-10-CM | POA: Diagnosis not present

## 2024-08-21 DIAGNOSIS — M6281 Muscle weakness (generalized): Secondary | ICD-10-CM

## 2024-08-21 DIAGNOSIS — M542 Cervicalgia: Secondary | ICD-10-CM

## 2024-08-21 DIAGNOSIS — M5030 Other cervical disc degeneration, unspecified cervical region: Secondary | ICD-10-CM

## 2024-08-21 NOTE — Therapy (Signed)
 OUTPATIENT PHYSICAL THERAPY TREATMENT   Patient Name: Andrea Zhang MRN: [id number] DOB:[birth year], 63 y.o., female Today's Date: 08/21/2024  END OF SESSION:  PT End of Session - 08/21/24 0724     Visit Number 13    Number of Visits 17    Date for Recertification  08/29/24    Authorization Type BCBS 2025, 30 visits    PT Start Time 0724    PT Stop Time 0806    PT Time Calculation (min) 42 min    Behavior During Therapy WFL for tasks assessed/performed            Past Medical History:  Diagnosis Date   Anemia    iron  deficiency and b12 deficiency   Anxiety    Aortic atherosclerosis    Arthritis    Asthma    Diabetes mellitus without complication (HCC)    Fasciitis    left foot   GERD (gastroesophageal reflux disease)    History of kidney stones    Hypercholesterolemia    Hypertension    Migraines    MIGRAINES HAVE IMPROVED SINCE RECEIVING IRON    Pneumonia    Primary osteoarthritis of right knee    Tachycardia    Past Surgical History:  Procedure Laterality Date   CHOLECYSTECTOMY  2004   COLONOSCOPY WITH ESOPHAGOGASTRODUODENOSCOPY (EGD)  02/2018   ESOPHAGOGASTRODUODENOSCOPY N/A 10/16/2020   Procedure: ESOPHAGOGASTRODUODENOSCOPY (EGD);  Surgeon: Maryruth Ole DASEN, MD;  Location: [facility] ENDOSCOPY;  Service: Endoscopy;  Laterality: N/A;   ESOPHAGOGASTRODUODENOSCOPY (EGD) WITH PROPOFOL  N/A 06/06/2016   Procedure: ESOPHAGOGASTRODUODENOSCOPY (EGD) WITH PROPOFOL ;  Surgeon: Lamar DASEN Holmes, MD;  Location: [facility] ENDOSCOPY;  Service: Endoscopy;  Laterality: N/A;   EXTRACORPOREAL SHOCK WAVE LITHOTRIPSY  2010   JOINT REPLACEMENT  2013   LT TKR   SAVORY DILATION N/A 06/06/2016   Procedure: SAVORY DILATION;  Surgeon: Lamar DASEN Holmes, MD;  Location: [facility] ENDOSCOPY;  Service: Endoscopy;  Laterality: N/A;   SAVORY DILATION  02/2018   SHOULDER ARTHROSCOPY WITH OPEN ROTATOR CUFF REPAIR Right 05/24/2018   Procedure: right shoulder arthroscopy, extensive arthroscopic  debridement, decompression, open rotator cuff repair, biceps tenodesis;  Surgeon: Edie Norleen PARAS, MD;  Location: [facility] ORS;  Service: Orthopedics;  Laterality: Right;   TONSILLECTOMY  1978   TOTAL KNEE ARTHROPLASTY Right 08/15/2023   Procedure: TOTAL KNEE ARTHROPLASTY;  Surgeon: Edie Norleen PARAS, MD;  Location: [facility] ORS;  Service: Orthopedics;  Laterality: Right;   Patient Active Problem List   Diagnosis Date Noted   Status post total knee replacement using cement, right 08/15/2023   Coronary artery calcification 09/09/2019   Aortic atherosclerosis 09/09/2019   Allergic rhinitis 08/06/2019   Asthma 08/06/2019   Diabetes mellitus, type 2 (HCC) 08/06/2019   Dysphagia 08/06/2019   GERD (gastroesophageal reflux disease) 08/06/2019   Hypercalcemia 08/06/2019   Hypertension 08/06/2019   Migraines 08/06/2019   Osteoarthritis 08/06/2019   Folate deficiency 03/03/2019   Morbid obesity with BMI of 40.0-44.9, adult (HCC) 06/05/2018   Status post right rotator cuff repair 06/05/2018   Degenerative joint disease of right shoulder 05/24/2018   Degenerative tear of glenoid labrum, right 05/24/2018   Injury of tendon of long head of right biceps 05/24/2018   Nontraumatic complete tear of right rotator cuff 05/14/2018   Rotator cuff tendinitis, right 05/14/2018   Iron  deficiency anemia 01/15/2018   B12 deficiency 09/07/2017   Microalbuminuric diabetic nephropathy (HCC) 09/07/2017   Primary osteoarthritis of right knee 02/19/2015   Restless leg syndrome 10/25/2012   Hyperlipidemia with target  LDL less than 100 05/20/2012    PCP: Lauraine Lamarr Leak, NP  REFERRING PROVIDER: Krystal Doyne, PA-C  REFERRING DIAG:  [phone] (ICD-10-CM) - Bursitis of left shoulder  M50.30 (ICD-10-CM) - Other cervical disc degeneration, unspecified cervical region  M54.2 (ICD-10-CM) - Cervicalgia    RATIONALE FOR EVALUATION AND TREATMENT: Rehabilitation  THERAPY DIAG: Left shoulder pain, unspecified chronicity  Other  cervical disc degeneration, unspecified cervical region  Cervicalgia  Muscle weakness (generalized)  ONSET DATE: Last 3 months, progressively worsening   FOLLOW-UP APPT SCHEDULED WITH REFERRING PROVIDER: No   PERTINENT HISTORY: Pt  is a 63 year old female referred for L upper quarter pain with Hx of bursitis and cervical DDD.   Atraumatic onset of L shoulder/paracervical pain. Pt reports having notable pain along anterior deltopectoral groove initially, more recently along posterior cuff/posterior deltoid region. Pt reports notable challenge with folding blanket and with reaching L arm forward - sensation of catching. Pain with turning arm outward. Hx of injections in shoulder with short-term benefit. Pt reports trigger point injections with short-term relief of occipital neuralgia/headaches, but these came back with severe intensity. (-) rheumatoid factor. Pt reports doing okay with donning/doffing bra and grooming/self-care ADLs. Hx of R RCR 2019. Pt has migraine Hx and is awaiting botox treatment.   PAIN:  Pain Intensity: Present: 3/10, Best: 0/10, Worst: 10/10 Pain location: Pain into L upper trap, L posterior cuff/posterior deltoid Pain Quality: sharp pain intermittently, dull pain at rest Radiating: Yes , intermittent periscapular pain and pain sometimes to R side also  Numbness/Tingling: No Focal Weakness: Yes, difficulty gripping, aching in her hands  Aggravating factors: forward reaching, outward reaching/external rotation, folding blankets, reaching out of driver's window Relieving factors: not moving, TENS unit 24-hour pain behavior: worse later in day  History of prior shoulder or neck/shoulder injury, pain, surgery, or therapy: No, only for contralateral shoulder Hx of RCR  Falls: Has patient fallen in last 6 months? No, Number of falls: N/A Dominant hand: right  Imaging: No   Red flags (personal history of cancer, chills/fever, night sweats, nausea, vomiting, unrelenting  pain, unexplained weight gain/loss): Positive for nausea only   -Nausea with migraines    PRECAUTIONS: None  WEIGHT BEARING RESTRICTIONS: No  FALLS: Has patient fallen in last 6 months? 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   Hobbies: Walking exercise after dinner, sewing, genealogy online  Patient Goals: Reduce pain, strengthen upper limb     OBJECTIVE (data from initial evaluation unless otherwise dated):   Patient Surveys  QuickDASH: 47.7%  Posture Rounded shoulders/protracted scapulae, mild kyphotic posturing  Cervical Screen AROM: WFL Spurlings A (ipsilateral lateral flexion/axial compression): R: Negative (localized pain R paracervical) L: Negative (pain L occipital region) Distraction/compression: Negative Repeated movement: No centralization or peripheralization with protraction or retraction Hoffman Sign (cervical cord compression): R: Negative L: Negative  AROM AROM (Normal range in degrees) AROM 07/10/24 AROM 08/12/24  Cervical   Flexion (50) 40 48  Extension (80) 53 55  Right lateral flexion (45) 43 38  Left lateral flexion (45) 35* 32  Right rotation (85) 75 77  Left rotation (85) 68 67   Right Left Right Left  Shoulder      Flexion 173 163 172 156  Extension  Abduction WNL 172 176 168  External Rotation 75 66 78 83  Internal Rotation WNL 67 WNL 65  Hands Behind Head      Hands Behind Back            Elbow      Flexion      Extension      Pronation      Supination      (* = pain; Blank rows = not tested)  UE MMT: MMT (out of 5) Right 07/10/24 Left 07/10/24 Right 08/12/24 Left 08/12/24        Shoulder     Flexion 4 4-* 4 4-*  Extension      Abduction 4-* 3+* 4* (mild pain) 3+*  External rotation 4-  4- 4 4  Internal rotation 4+ 4+* 4+ 4+* (mild pain)  Horizontal abduction      Horizontal adduction      Lower Trapezius      Rhomboids            Elbow    Flexion 5 5    Extension 5 5    Pronation      Supination            (* = pain; Blank rows = not tested)  Sensation Grossly intact to light touch bilateral UE as determined by testing dermatomes C2-T2. Proprioception and hot/cold testing deferred on this date.  Reflexes R/L Biceps (L3/4): 2+/2+  Triceps (S1/2): 2+/2+  Brachioradialis: 2+/2  Palpation Location LEFT  RIGHT           Subocciptials    Cervical paraspinals 1   Upper Trapezius 2   Levator Scapulae    Rhomboid Major/Minor    Sternoclavicular joint    Acromioclavicular joint 1   Coracoid process 1   Long head of biceps 2   Supraspinatus 2   Infraspinatus 2   Subscapularis    Teres Minor    Teres Major    Pectoralis Major    Pectoralis Minor    Anterior Deltoid 1   Lateral Deltoid 1   Posterior Deltoid 1   Latissimus Dorsi    Sternocleidomastoid    (Blank rows = not tested) Graded on 0-4 scale (0 = no pain, 1 = pain, 2 = pain with wincing/grimacing/flinching, 3 = pain with withdrawal, 4 = unwilling to allow palpation), (Blank rows = not tested)    SPECIAL TESTS Rotator Cuff  Drop Arm Test: Negative Painful Arc (Pain from 60 to 120 degrees scaption): Negative Infraspinatus Muscle Test: Negative  Subacromial Impingement Hawkins-Kennedy: Negative Neer (Block scapula, PROM flexion): Negative Painful Arc (Pain from 60 to 120 degrees scaption): Negative Empty Can: Negative (less painful in internally rotated position) External Rotation Resistance: Negative Horizontal Adduction: Negative Scapular Assist: Positive   Bicep Tendon Pathology Speed (shoulder flexion to 90, external rotation, full elbow extension, and forearm supination with resistance: Positive Yergason's (resisted shoulder ER and supination/biceps tendon pathology):  Positive   Accessory Motions/Glides (updated 07/15/24) Glenohumeral: WNL with no notable mobility deficits Scapulothoracic: Mild hypomobility with protraction and upward rotation      TODAY'S TREATMENT     08/21/2024   SUBJECTIVE STATEMENT:   Patient reports no notable pain at arrival to PT. Patient reports doing well with moving boxes and items in garage.    Therapeutic Exercise - for improved soft tissue flexibility and extensibility as needed for ROM, improved strength as needed to improve capacity for overhead activity and lifting   Upper body ergometer,  2 minutes forward, 2 minutes backward - for tissue warm-up to improve muscle performance, improved soft tissue mobility/extensibility - subjective obtained during this time  Sidelying external rotation, 4-lb weight; 2 x 10   -pain-free ROM only   Supine shoulder flexion with 3-lb Dbell 2 x 10  PATIENT EDUCATION: HEP reviewed.    *not today* Supine shoulder flexion AROM; 1 x 12 Theraband external rotation isometric walkout, Red Tband; 2 x 10 Theraband flexion isometric walkout, Red Tband; 2 x 10 Theraband internal rotation isometric walkout, Red Tband; 2 x 10 Shoulder isometrics, at wall with folded towel; flexion, ER; reviewed    Neuromuscular Re-education - soft tissue work and mechanical stimulation for desensitization/neuromodulatory effect; for scapular control/scapulothoracic stability, postural re-edu  Rhythmic stabilization, with 5-lb Dbell; 2 x 2 min   STM/DTM/TPR: Bilat splenius cervicis C3-6, bilat upper trapezius; x 6 minutes  Nautilus lat pulldown; 50-lb, with straight bar; 2 x 10, 3 sec hold with scap retraction/depression  -also working into full overhead motion with eccentric phase  Standing mid-row with Blue Tband; 2 x 12, 3 sec hold  -tactile cueing for scap retraction/depression    Standing bilat scaption, with 2-lb Dbells; 2 x 10   -mirror feedback for technique/angle    *not  today* Serratus slide at wall with foam roller; 2 x10   -yellow Tband around wrists for posterior cuff activation with serratus slide Wall slide with pillow case, pushing into ER for posterior cuff isometric; 2 x 10 Bilat UT DTM/TPR; x 5 min High row; 2 x 10, Green Tband; 2 x 10, 3 sec hold  Serratus punch, supine; 2 x 10, with 4-lb Dbells       PATIENT EDUCATION:  Education details: see above for patient education details Person educated: Patient Education method: Explanation, demonstration, handouts Education comprehension: verbalized understanding and returned demonstration   HOME EXERCISE PROGRAM:  Access Code: [email or web address] URL: [email or web address] Date: 07/29/2024 Prepared by: Venetia Endo  Exercises - Standing Shoulder Posterior Capsule Stretch  - 2 x daily - 7 x weekly - 3 sets - 30sec hold - Seated Upper Trapezius Stretch  - 2 x daily - 7 x weekly - 3 sets - 30sec hold - Shoulder External Rotation Reactive Isometrics  - 2 x daily - 7 x weekly - 2 sets - 10 reps - Standing Shoulder Flexion Reactive Isometrics with Elbow Extended  - 2 x daily - 7 x weekly - 2 sets - 10 reps - Standing Shoulder Row with Anchored Resistance  - 2 x daily - 7 x weekly - 2 sets - 10 reps   ASSESSMENT:  CLINICAL IMPRESSION: Patient has minimal symptoms this AM with her being off work much of the week. She has good L shoulder ROM and mild sensitivity along paracervical musculature. We are able to continue progression of intensity for resisted shoulder elevation and RTC/periscapular isotonics. Pt has current deficits in: L shoulder AROM, dec shoulder flexor/abductor and posterior cuff strength, postural dysfunction/protracted and anteriorly tilted scapulae, and sensitivity along L bicipital tendon region/UT/supraspinatus/infraspinatus. Pt will continue to benefit from skilled PT services to address deficits and improve function.  OBJECTIVE IMPAIRMENTS: decreased ROM, decreased  strength, impaired flexibility, impaired UE functional use, postural dysfunction, and pain.   ACTIVITY LIMITATIONS: carrying, lifting, and reach over head  PARTICIPATION LIMITATIONS: meal prep and community activity  PERSONAL FACTORS: Past/current experiences, Time since onset of injury/illness/exacerbation, and 3+ comorbidities: (anemia, anxiety, atherosclerosis, asthma, Type 2 DM, HTN, OA) are also affecting patient's functional outcome.  REHAB POTENTIAL: Good  CLINICAL DECISION MAKING: Evolving/moderate complexity  EVALUATION COMPLEXITY: Moderate   GOALS: Goals reviewed with patient? Yes  SHORT TERM GOALS: Target date: 08/01/2024  Pt will be independent with HEP to improve strength and decrease shoulder pain to improve pain-free function at home and work. Baseline: 07/10/24: Baseline HEP initiated 08/12/24: Pt is mostly compliant, she states I could do better.  Goal status: PARTIALLY MET    LONG TERM GOALS: Target date: 08/29/2024  Pt will have L shoulder AROM within 5 degrees of contralateral upper limb or greater for all planes of motion tested without reproduction of pain as needed for reaching, household chores, and lifting tasks.  Baseline:  07/10/24: Deficit relative to RUE with flexion, abduction, ER.  08/12/24: Met for ER and IR; not met for flexion/abduction.  Goal status: ON-GOING  2.  Pt will decrease worst shoulder pain by at least 3 points on the NPRS in order to demonstrate clinically significant reduction in shoulder pain. Baseline: 07/10/24: 10/10 at worst  08/12/24: 6/10 at worst.  Goal status: ACHIEVED   3.  Pt will decrease quick DASH score by at least 8% in order to demonstrate clinically significant reduction in disability related to shoulder pain        Baseline: 07/10/24: 47.7% 08/12/24: 34.09% Goal status: ACHIEVED  4. Pt will increase strength to at least 4/5 or greater MMT grade in order to demonstrate improvement in strength and function for L shoulder/L  upper limb      Baseline: 07/10/24: L shoulder MMTs 3+ to 4- with exception of strong and painful IR.  08/12/24: MMTs to 4/5 or greater for ER/IR, not met for flexion/ABD (long-lever resistance) Goal status: ON-GOING   PLAN: PT FREQUENCY: 1-2x/week  PT DURATION: 4 weeks  PLANNED INTERVENTIONS: Therapeutic exercises, Therapeutic activity, Neuromuscular re-education, Balance training, Gait training, Patient/Family education, Self Care, Joint mobilization, Joint manipulation, Vestibular training, Canalith repositioning, Orthotic/Fit training, DME instructions, Dry Needling, Electrical stimulation, Spinal manipulation, Spinal mobilization, Cryotherapy, Moist heat, Taping, Traction, Ultrasound, Ionotophoresis 4mg /ml Dexamethasone , Manual therapy, and Re-evaluation.  PLAN FOR NEXT SESSION: Continue with scapular control drills. Progressive long-lever resistance drills for flexion/ABD, posterior cuff strengthening. Periscapular isotonics.     Venetia Endo, PT, DPT #[id number]  Venetia ONEIDA Endo, PT 08/21/2024, 7:29 AM

## 2024-08-26 ENCOUNTER — Encounter: Admitting: Physical Therapy

## 2024-08-28 ENCOUNTER — Ambulatory Visit: Admitting: Physical Therapy

## 2024-08-28 DIAGNOSIS — M5030 Other cervical disc degeneration, unspecified cervical region: Secondary | ICD-10-CM

## 2024-08-28 DIAGNOSIS — M25512 Pain in left shoulder: Secondary | ICD-10-CM

## 2024-08-28 DIAGNOSIS — M542 Cervicalgia: Secondary | ICD-10-CM

## 2024-08-28 DIAGNOSIS — M6281 Muscle weakness (generalized): Secondary | ICD-10-CM

## 2024-08-28 NOTE — Therapy (Signed)
 OUTPATIENT PHYSICAL THERAPY TREATMENT   Patient Name: Andrea Zhang MRN: [id number] DOB:[birth year], 63 y.o., female Today's Date: 08/28/2024  END OF SESSION:  PT End of Session - 08/28/24 0726     Visit Number 14    Number of Visits 17    Date for Recertification  08/29/24    Authorization Type BCBS 2025, 30 visits    PT Start Time 0728    PT Stop Time 0813    PT Time Calculation (min) 45 min    Behavior During Therapy WFL for tasks assessed/performed            Past Medical History:  Diagnosis Date   Anemia    iron  deficiency and b12 deficiency   Anxiety    Aortic atherosclerosis    Arthritis    Asthma    Diabetes mellitus without complication (HCC)    Fasciitis    left foot   GERD (gastroesophageal reflux disease)    History of kidney stones    Hypercholesterolemia    Hypertension    Migraines    MIGRAINES HAVE IMPROVED SINCE RECEIVING IRON    Pneumonia    Primary osteoarthritis of right knee    Tachycardia    Past Surgical History:  Procedure Laterality Date   CHOLECYSTECTOMY  2004   COLONOSCOPY WITH ESOPHAGOGASTRODUODENOSCOPY (EGD)  02/2018   ESOPHAGOGASTRODUODENOSCOPY N/A 10/16/2020   Procedure: ESOPHAGOGASTRODUODENOSCOPY (EGD);  Surgeon: Maryruth Ole DASEN, MD;  Location: [facility] ENDOSCOPY;  Service: Endoscopy;  Laterality: N/A;   ESOPHAGOGASTRODUODENOSCOPY (EGD) WITH PROPOFOL  N/A 06/06/2016   Procedure: ESOPHAGOGASTRODUODENOSCOPY (EGD) WITH PROPOFOL ;  Surgeon: Lamar DASEN Holmes, MD;  Location: [facility] ENDOSCOPY;  Service: Endoscopy;  Laterality: N/A;   EXTRACORPOREAL SHOCK WAVE LITHOTRIPSY  2010   JOINT REPLACEMENT  2013   LT TKR   SAVORY DILATION N/A 06/06/2016   Procedure: SAVORY DILATION;  Surgeon: Lamar DASEN Holmes, MD;  Location: [facility] ENDOSCOPY;  Service: Endoscopy;  Laterality: N/A;   SAVORY DILATION  02/2018   SHOULDER ARTHROSCOPY WITH OPEN ROTATOR CUFF REPAIR Right 05/24/2018   Procedure: right shoulder arthroscopy, extensive arthroscopic  debridement, decompression, open rotator cuff repair, biceps tenodesis;  Surgeon: Edie Norleen PARAS, MD;  Location: [facility] ORS;  Service: Orthopedics;  Laterality: Right;   TONSILLECTOMY  1978   TOTAL KNEE ARTHROPLASTY Right 08/15/2023   Procedure: TOTAL KNEE ARTHROPLASTY;  Surgeon: Edie Norleen PARAS, MD;  Location: [facility] ORS;  Service: Orthopedics;  Laterality: Right;   Patient Active Problem List   Diagnosis Date Noted   Status post total knee replacement using cement, right 08/15/2023   Coronary artery calcification 09/09/2019   Aortic atherosclerosis 09/09/2019   Allergic rhinitis 08/06/2019   Asthma 08/06/2019   Diabetes mellitus, type 2 (HCC) 08/06/2019   Dysphagia 08/06/2019   GERD (gastroesophageal reflux disease) 08/06/2019   Hypercalcemia 08/06/2019   Hypertension 08/06/2019   Migraines 08/06/2019   Osteoarthritis 08/06/2019   Folate deficiency 03/03/2019   Morbid obesity with BMI of 40.0-44.9, adult (HCC) 06/05/2018   Status post right rotator cuff repair 06/05/2018   Degenerative joint disease of right shoulder 05/24/2018   Degenerative tear of glenoid labrum, right 05/24/2018   Injury of tendon of long head of right biceps 05/24/2018   Nontraumatic complete tear of right rotator cuff 05/14/2018   Rotator cuff tendinitis, right 05/14/2018   Iron  deficiency anemia 01/15/2018   B12 deficiency 09/07/2017   Microalbuminuric diabetic nephropathy (HCC) 09/07/2017   Primary osteoarthritis of right knee 02/19/2015   Restless leg syndrome 10/25/2012   Hyperlipidemia with target  LDL less than 100 05/20/2012    PCP: Lauraine Lamarr Leak, NP  REFERRING PROVIDER: Krystal Doyne, PA-C  REFERRING DIAG:  [phone] (ICD-10-CM) - Bursitis of left shoulder  M50.30 (ICD-10-CM) - Other cervical disc degeneration, unspecified cervical region  M54.2 (ICD-10-CM) - Cervicalgia    RATIONALE FOR EVALUATION AND TREATMENT: Rehabilitation  THERAPY DIAG: Left shoulder pain, unspecified chronicity  Other  cervical disc degeneration, unspecified cervical region  Cervicalgia  Muscle weakness (generalized)  ONSET DATE: Last 3 months, progressively worsening   FOLLOW-UP APPT SCHEDULED WITH REFERRING PROVIDER: No   PERTINENT HISTORY: Pt  is a 63 year old female referred for L upper quarter pain with Hx of bursitis and cervical DDD.   Atraumatic onset of L shoulder/paracervical pain. Pt reports having notable pain along anterior deltopectoral groove initially, more recently along posterior cuff/posterior deltoid region. Pt reports notable challenge with folding blanket and with reaching L arm forward - sensation of catching. Pain with turning arm outward. Hx of injections in shoulder with short-term benefit. Pt reports trigger point injections with short-term relief of occipital neuralgia/headaches, but these came back with severe intensity. (-) rheumatoid factor. Pt reports doing okay with donning/doffing bra and grooming/self-care ADLs. Hx of R RCR 2019. Pt has migraine Hx and is awaiting botox treatment.   PAIN:  Pain Intensity: Present: 3/10, Best: 0/10, Worst: 10/10 Pain location: Pain into L upper trap, L posterior cuff/posterior deltoid Pain Quality: sharp pain intermittently, dull pain at rest Radiating: Yes , intermittent periscapular pain and pain sometimes to R side also  Numbness/Tingling: No Focal Weakness: Yes, difficulty gripping, aching in her hands  Aggravating factors: forward reaching, outward reaching/external rotation, folding blankets, reaching out of driver's window Relieving factors: not moving, TENS unit 24-hour pain behavior: worse later in day  History of prior shoulder or neck/shoulder injury, pain, surgery, or therapy: No, only for contralateral shoulder Hx of RCR  Falls: Has patient fallen in last 6 months? No, Number of falls: N/A Dominant hand: right  Imaging: No   Red flags (personal history of cancer, chills/fever, night sweats, nausea, vomiting, unrelenting  pain, unexplained weight gain/loss): Positive for nausea only   -Nausea with migraines    PRECAUTIONS: None  WEIGHT BEARING RESTRICTIONS: No  FALLS: Has patient fallen in last 6 months? 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   Hobbies: Walking exercise after dinner, sewing, genealogy online  Patient Goals: Reduce pain, strengthen upper limb     OBJECTIVE (data from initial evaluation unless otherwise dated):   Patient Surveys  QuickDASH: 47.7%  Posture Rounded shoulders/protracted scapulae, mild kyphotic posturing  Cervical Screen AROM: WFL Spurlings A (ipsilateral lateral flexion/axial compression): R: Negative (localized pain R paracervical) L: Negative (pain L occipital region) Distraction/compression: Negative Repeated movement: No centralization or peripheralization with protraction or retraction Hoffman Sign (cervical cord compression): R: Negative L: Negative  AROM AROM (Normal range in degrees) AROM 07/10/24 AROM 08/12/24  Cervical   Flexion (50) 40 48  Extension (80) 53 55  Right lateral flexion (45) 43 38  Left lateral flexion (45) 35* 32  Right rotation (85) 75 77  Left rotation (85) 68 67   Right Left Right Left  Shoulder      Flexion 173 163 172 156  Extension  Abduction WNL 172 176 168  External Rotation 75 66 78 83  Internal Rotation WNL 67 WNL 65  Hands Behind Head      Hands Behind Back            Elbow      Flexion      Extension      Pronation      Supination      (* = pain; Blank rows = not tested)  UE MMT: MMT (out of 5) Right 07/10/24 Left 07/10/24 Right 08/12/24 Left 08/12/24        Shoulder     Flexion 4 4-* 4 4-*  Extension      Abduction 4-* 3+* 4* (mild pain) 3+*  External rotation 4-  4- 4 4  Internal rotation 4+ 4+* 4+ 4+* (mild pain)  Horizontal abduction      Horizontal adduction      Lower Trapezius      Rhomboids            Elbow    Flexion 5 5    Extension 5 5    Pronation      Supination            (* = pain; Blank rows = not tested)  Sensation Grossly intact to light touch bilateral UE as determined by testing dermatomes C2-T2. Proprioception and hot/cold testing deferred on this date.  Reflexes R/L Biceps (L3/4): 2+/2+  Triceps (S1/2): 2+/2+  Brachioradialis: 2+/2  Palpation Location LEFT  RIGHT           Subocciptials    Cervical paraspinals 1   Upper Trapezius 2   Levator Scapulae    Rhomboid Major/Minor    Sternoclavicular joint    Acromioclavicular joint 1   Coracoid process 1   Long head of biceps 2   Supraspinatus 2   Infraspinatus 2   Subscapularis    Teres Minor    Teres Major    Pectoralis Major    Pectoralis Minor    Anterior Deltoid 1   Lateral Deltoid 1   Posterior Deltoid 1   Latissimus Dorsi    Sternocleidomastoid    (Blank rows = not tested) Graded on 0-4 scale (0 = no pain, 1 = pain, 2 = pain with wincing/grimacing/flinching, 3 = pain with withdrawal, 4 = unwilling to allow palpation), (Blank rows = not tested)    SPECIAL TESTS Rotator Cuff  Drop Arm Test: Negative Painful Arc (Pain from 60 to 120 degrees scaption): Negative Infraspinatus Muscle Test: Negative  Subacromial Impingement Hawkins-Kennedy: Negative Neer (Block scapula, PROM flexion): Negative Painful Arc (Pain from 60 to 120 degrees scaption): Negative Empty Can: Negative (less painful in internally rotated position) External Rotation Resistance: Negative Horizontal Adduction: Negative Scapular Assist: Positive   Bicep Tendon Pathology Speed (shoulder flexion to 90, external rotation, full elbow extension, and forearm supination with resistance: Positive Yergason's (resisted shoulder ER and supination/biceps tendon pathology):  Positive   Accessory Motions/Glides (updated 07/15/24) Glenohumeral: WNL with no notable mobility deficits Scapulothoracic: Mild hypomobility with protraction and upward rotation      TODAY'S TREATMENT     08/28/2024   SUBJECTIVE STATEMENT:   Patient reports 2/10 pain at arrival. She reports multiple little things/mild discomfort affecting various regions. She reports mild paracervical discomfort on L side.    Therapeutic Exercise - for improved soft tissue flexibility and extensibility as needed for ROM, improved strength as needed to improve capacity for overhead activity and lifting  Upper body ergometer, 2 minutes forward, 2 minutes backward - for tissue warm-up to improve muscle performance, improved soft tissue mobility/extensibility - subjective obtained during this time  Sidelying external rotation, 4-lb weight; 2 x 10   -pain-free ROM only   PATIENT EDUCATION: HEP updated and reviewed.    *not today* Supine shoulder flexion with 3-lb Dbell 2 x 10 Supine shoulder flexion AROM; 1 x 12 Theraband external rotation isometric walkout, Red Tband; 2 x 10 Theraband flexion isometric walkout, Red Tband; 2 x 10 Theraband internal rotation isometric walkout, Red Tband; 2 x 10 Shoulder isometrics, at wall with folded towel; flexion, ER; reviewed    Neuromuscular Re-education - soft tissue work and mechanical stimulation for desensitization/neuromodulatory effect; for scapular control/scapulothoracic stability, postural re-edu  Rhythmic stabilization, with 5-lb Dbell; 1 x 3 min   STM/DTM/TPR: Bilat splenius cervicis C3-6, bilat upper trapezius; x 6 minutes  Nautilus lat pulldown; with straight bar; 1x10 with 5-lb, 1x10 with 60-lb;  3 sec hold with scap retraction/depression  -also working into full overhead motion with eccentric phase  Standing mid-row with Black Tband; 2 x 10; 3 sec hold  -tactile cueing for scap retraction/depression    Standing bilat scaption, with  2-lb Dbells; 2 x 10   -mirror feedback for technique/angle    *not today* Serratus slide at wall with foam roller; 2 x10   -yellow Tband around wrists for posterior cuff activation with serratus slide Wall slide with pillow case, pushing into ER for posterior cuff isometric; 2 x 10 Bilat UT DTM/TPR; x 5 min High row; 2 x 10, Green Tband; 2 x 10, 3 sec hold  Serratus punch, supine; 2 x 10, with 4-lb Dbells       PATIENT EDUCATION:  Education details: see above for patient education details Person educated: Patient Education method: Explanation, demonstration, handouts Education comprehension: verbalized understanding and returned demonstration   HOME EXERCISE PROGRAM:  Access Code: [email or web address] URL: [email or web address] Date: 08/28/2024 Prepared by: Venetia Endo  Exercises - Standing Shoulder Posterior Capsule Stretch  - 2 x daily - 7 x weekly - 3 sets - 30sec hold - Seated Upper Trapezius Stretch  - 2 x daily - 7 x weekly - 3 sets - 30sec hold - Shoulder External Rotation Reactive Isometrics  - 2 x daily - 7 x weekly - 2 sets - 10 reps - Standing Shoulder Flexion Reactive Isometrics with Elbow Extended  - 2 x daily - 7 x weekly - 2 sets - 10 reps - Standing Shoulder Row with Anchored Resistance  - 2 x daily - 7 x weekly - 2 sets - 10 reps - Scaption with Dumbbells  - 1 x daily - 7 x weekly - 2 sets - 10 reps   ASSESSMENT:  CLINICAL IMPRESSION: Patient is doing well with gradual progression of PRE and LHB/RTC strengthening. Pt fortunately has mild symptoms this AM and tolerated resisted drills better with successive weeks in PT. HEP was further updated for RTC/long head of biceps strengthening. Pt has current deficits in: L shoulder AROM, dec shoulder flexor/abductor and posterior cuff strength, postural dysfunction/protracted and anteriorly tilted scapulae, and sensitivity along L bicipital tendon region/UT/supraspinatus/infraspinatus. Pt will continue to  benefit from skilled PT services to address deficits and improve function.  OBJECTIVE IMPAIRMENTS: decreased ROM, decreased strength, impaired flexibility, impaired UE functional use, postural dysfunction, and pain.   ACTIVITY LIMITATIONS: carrying, lifting, and reach over head  PARTICIPATION LIMITATIONS: meal prep and community activity  PERSONAL FACTORS: Past/current experiences, Time since  onset of injury/illness/exacerbation, and 3+ comorbidities: (anemia, anxiety, atherosclerosis, asthma, Type 2 DM, HTN, OA) are also affecting patient's functional outcome.   REHAB POTENTIAL: Good  CLINICAL DECISION MAKING: Evolving/moderate complexity  EVALUATION COMPLEXITY: Moderate   GOALS: Goals reviewed with patient? Yes  SHORT TERM GOALS: Target date: 08/01/2024  Pt will be independent with HEP to improve strength and decrease shoulder pain to improve pain-free function at home and work. Baseline: 07/10/24: Baseline HEP initiated 08/12/24: Pt is mostly compliant, she states I could do better.  Goal status: PARTIALLY MET    LONG TERM GOALS: Target date: 08/29/2024  Pt will have L shoulder AROM within 5 degrees of contralateral upper limb or greater for all planes of motion tested without reproduction of pain as needed for reaching, household chores, and lifting tasks.  Baseline:  07/10/24: Deficit relative to RUE with flexion, abduction, ER.  08/12/24: Met for ER and IR; not met for flexion/abduction.  Goal status: ON-GOING  2.  Pt will decrease worst shoulder pain by at least 3 points on the NPRS in order to demonstrate clinically significant reduction in shoulder pain. Baseline: 07/10/24: 10/10 at worst  08/12/24: 6/10 at worst.  Goal status: ACHIEVED   3.  Pt will decrease quick DASH score by at least 8% in order to demonstrate clinically significant reduction in disability related to shoulder pain        Baseline: 07/10/24: 47.7% 08/12/24: 34.09% Goal status: ACHIEVED  4. Pt will  increase strength to at least 4/5 or greater MMT grade in order to demonstrate improvement in strength and function for L shoulder/L upper limb      Baseline: 07/10/24: L shoulder MMTs 3+ to 4- with exception of strong and painful IR.  08/12/24: MMTs to 4/5 or greater for ER/IR, not met for flexion/ABD (long-lever resistance) Goal status: ON-GOING   PLAN: PT FREQUENCY: 1-2x/week  PT DURATION: 4 weeks  PLANNED INTERVENTIONS: Therapeutic exercises, Therapeutic activity, Neuromuscular re-education, Balance training, Gait training, Patient/Family education, Self Care, Joint mobilization, Joint manipulation, Vestibular training, Canalith repositioning, Orthotic/Fit training, DME instructions, Dry Needling, Electrical stimulation, Spinal manipulation, Spinal mobilization, Cryotherapy, Moist heat, Taping, Traction, Ultrasound, Ionotophoresis 4mg /ml Dexamethasone , Manual therapy, and Re-evaluation.  PLAN FOR NEXT SESSION: Continue with scapular control drills. Progressive long-lever resistance drills for flexion/ABD, posterior cuff strengthening. Periscapular isotonics.     Venetia Endo, PT, DPT #[id number]  Venetia ONEIDA Endo, PT 08/28/2024, 8:13 AM

## 2024-09-04 ENCOUNTER — Ambulatory Visit: Admitting: Physical Therapy

## 2024-09-04 DIAGNOSIS — M5030 Other cervical disc degeneration, unspecified cervical region: Secondary | ICD-10-CM

## 2024-09-04 DIAGNOSIS — M25512 Pain in left shoulder: Secondary | ICD-10-CM | POA: Diagnosis not present

## 2024-09-04 DIAGNOSIS — M6281 Muscle weakness (generalized): Secondary | ICD-10-CM

## 2024-09-04 DIAGNOSIS — M542 Cervicalgia: Secondary | ICD-10-CM

## 2024-09-04 NOTE — Therapy (Unsigned)
 OUTPATIENT PHYSICAL THERAPY TREATMENT   Patient Name: Andrea Zhang MRN: [id number] DOB:Jul 11, 1961, 63 y.o., female Today's Date: 09/04/2024  END OF SESSION:  PT End of Session - 09/04/24 0832     Visit Number 15    Number of Visits 17    Date for Recertification  08/29/24    Authorization Type BCBS 2025, 30 visits    PT Start Time 0735    PT Stop Time 0818    PT Time Calculation (min) 43 min    Behavior During Therapy WFL for tasks assessed/performed             Past Medical History:  Diagnosis Date   Anemia    iron  deficiency and b12 deficiency   Anxiety    Aortic atherosclerosis    Arthritis    Asthma    Diabetes mellitus without complication (HCC)    Fasciitis    left foot   GERD (gastroesophageal reflux disease)    History of kidney stones    Hypercholesterolemia    Hypertension    Migraines    MIGRAINES HAVE IMPROVED SINCE RECEIVING IRON    Pneumonia    Primary osteoarthritis of right knee    Tachycardia    Past Surgical History:  Procedure Laterality Date   CHOLECYSTECTOMY  2004   COLONOSCOPY WITH ESOPHAGOGASTRODUODENOSCOPY (EGD)  02/2018   ESOPHAGOGASTRODUODENOSCOPY N/A 10/16/2020   Procedure: ESOPHAGOGASTRODUODENOSCOPY (EGD);  Surgeon: Maryruth Ole DASEN, MD;  Location: [facility] ENDOSCOPY;  Service: Endoscopy;  Laterality: N/A;   ESOPHAGOGASTRODUODENOSCOPY (EGD) WITH PROPOFOL  N/A 06/06/2016   Procedure: ESOPHAGOGASTRODUODENOSCOPY (EGD) WITH PROPOFOL ;  Surgeon: Lamar DASEN Holmes, MD;  Location: [facility] ENDOSCOPY;  Service: Endoscopy;  Laterality: N/A;   EXTRACORPOREAL SHOCK WAVE LITHOTRIPSY  2010   JOINT REPLACEMENT  2013   LT TKR   SAVORY DILATION N/A 06/06/2016   Procedure: SAVORY DILATION;  Surgeon: Lamar DASEN Holmes, MD;  Location: [facility] ENDOSCOPY;  Service: Endoscopy;  Laterality: N/A;   SAVORY DILATION  02/2018   SHOULDER ARTHROSCOPY WITH OPEN ROTATOR CUFF REPAIR Right 05/24/2018   Procedure: right shoulder arthroscopy, extensive arthroscopic  debridement, decompression, open rotator cuff repair, biceps tenodesis;  Surgeon: Edie Norleen PARAS, MD;  Location: [facility] ORS;  Service: Orthopedics;  Laterality: Right;   TONSILLECTOMY  1978   TOTAL KNEE ARTHROPLASTY Right 08/15/2023   Procedure: TOTAL KNEE ARTHROPLASTY;  Surgeon: Edie Norleen PARAS, MD;  Location: [facility] ORS;  Service: Orthopedics;  Laterality: Right;   Patient Active Problem List   Diagnosis Date Noted   Status post total knee replacement using cement, right 08/15/2023   Coronary artery calcification 09/09/2019   Aortic atherosclerosis 09/09/2019   Allergic rhinitis 08/06/2019   Asthma 08/06/2019   Diabetes mellitus, type 2 (HCC) 08/06/2019   Dysphagia 08/06/2019   GERD (gastroesophageal reflux disease) 08/06/2019   Hypercalcemia 08/06/2019   Hypertension 08/06/2019   Migraines 08/06/2019   Osteoarthritis 08/06/2019   Folate deficiency 03/03/2019   Morbid obesity with BMI of 40.0-44.9, adult (HCC) 06/05/2018   Status post right rotator cuff repair 06/05/2018   Degenerative joint disease of right shoulder 05/24/2018   Degenerative tear of glenoid labrum, right 05/24/2018   Injury of tendon of long head of right biceps 05/24/2018   Nontraumatic complete tear of right rotator cuff 05/14/2018   Rotator cuff tendinitis, right 05/14/2018   Iron  deficiency anemia 01/15/2018   B12 deficiency 09/07/2017   Microalbuminuric diabetic nephropathy (HCC) 09/07/2017   Primary osteoarthritis of right knee 02/19/2015   Restless leg syndrome 10/25/2012   Hyperlipidemia with  target LDL less than 100 05/20/2012    PCP: Lauraine Lamarr Leak, NP  REFERRING PROVIDER: Krystal Doyne, PA-C  REFERRING DIAG:  [phone] (ICD-10-CM) - Bursitis of left shoulder  M50.30 (ICD-10-CM) - Other cervical disc degeneration, unspecified cervical region  M54.2 (ICD-10-CM) - Cervicalgia    RATIONALE FOR EVALUATION AND TREATMENT: Rehabilitation  THERAPY DIAG: Left shoulder pain, unspecified chronicity  Other  cervical disc degeneration, unspecified cervical region  Cervicalgia  Muscle weakness (generalized)  ONSET DATE: Last 3 months, progressively worsening   FOLLOW-UP APPT SCHEDULED WITH REFERRING PROVIDER: No   PERTINENT HISTORY: Pt  is a 63 year old female referred for L upper quarter pain with Hx of bursitis and cervical DDD.   Atraumatic onset of L shoulder/paracervical pain. Pt reports having notable pain along anterior deltopectoral groove initially, more recently along posterior cuff/posterior deltoid region. Pt reports notable challenge with folding blanket and with reaching L arm forward - sensation of catching. Pain with turning arm outward. Hx of injections in shoulder with short-term benefit. Pt reports trigger point injections with short-term relief of occipital neuralgia/headaches, but these came back with severe intensity. (-) rheumatoid factor. Pt reports doing okay with donning/doffing bra and grooming/self-care ADLs. Hx of R RCR 2019. Pt has migraine Hx and is awaiting botox treatment.   PAIN:  Pain Intensity: Present: 3/10, Best: 0/10, Worst: 10/10 Pain location: Pain into L upper trap, L posterior cuff/posterior deltoid Pain Quality: sharp pain intermittently, dull pain at rest Radiating: Yes , intermittent periscapular pain and pain sometimes to R side also  Numbness/Tingling: No Focal Weakness: Yes, difficulty gripping, aching in her hands  Aggravating factors: forward reaching, outward reaching/external rotation, folding blankets, reaching out of driver's window Relieving factors: not moving, TENS unit 24-hour pain behavior: worse later in day  History of prior shoulder or neck/shoulder injury, pain, surgery, or therapy: No, only for contralateral shoulder Hx of RCR  Falls: Has patient fallen in last 6 months? No, Number of falls: N/A Dominant hand: right  Imaging: No   Red flags (personal history of cancer, chills/fever, night sweats, nausea, vomiting, unrelenting  pain, unexplained weight gain/loss): Positive for nausea only   -Nausea with migraines    PRECAUTIONS: None  WEIGHT BEARING RESTRICTIONS: No  FALLS: Has patient fallen in last 6 months? 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   Hobbies: Walking exercise after dinner, sewing, genealogy online  Patient Goals: Reduce pain, strengthen upper limb     OBJECTIVE (data from initial evaluation unless otherwise dated):   Patient Surveys  QuickDASH: 47.7%  Posture Rounded shoulders/protracted scapulae, mild kyphotic posturing  Cervical Screen AROM: WFL Spurlings A (ipsilateral lateral flexion/axial compression): R: Negative (localized pain R paracervical) L: Negative (pain L occipital region) Distraction/compression: Negative Repeated movement: No centralization or peripheralization with protraction or retraction Hoffman Sign (cervical cord compression): R: Negative L: Negative  AROM AROM (Normal range in degrees) AROM 07/10/24 AROM 08/12/24  Cervical   Flexion (50) 40 48  Extension (80) 53 55  Right lateral flexion (45) 43 38  Left lateral flexion (45) 35* 32  Right rotation (85) 75 77  Left rotation (85) 68 67   Right Left Right Left  Shoulder      Flexion 173 163 172 156  Extension  Abduction WNL 172 176 168  External Rotation 75 66 78 83  Internal Rotation WNL 67 WNL 65  Hands Behind Head      Hands Behind Back            Elbow      Flexion      Extension      Pronation      Supination      (* = pain; Blank rows = not tested)  UE MMT: MMT (out of 5) Right 07/10/24 Left 07/10/24 Right 08/12/24 Left 08/12/24        Shoulder     Flexion 4 4-* 4 4-*  Extension      Abduction 4-* 3+* 4* (mild pain) 3+*  External rotation 4-  4- 4 4  Internal rotation 4+ 4+* 4+ 4+* (mild pain)  Horizontal abduction      Horizontal adduction      Lower Trapezius      Rhomboids            Elbow    Flexion 5 5    Extension 5 5    Pronation      Supination            (* = pain; Blank rows = not tested)  Sensation Grossly intact to light touch bilateral UE as determined by testing dermatomes C2-T2. Proprioception and hot/cold testing deferred on this date.  Reflexes R/L Biceps (L3/4): 2+/2+  Triceps (S1/2): 2+/2+  Brachioradialis: 2+/2  Palpation Location LEFT  RIGHT           Subocciptials    Cervical paraspinals 1   Upper Trapezius 2   Levator Scapulae    Rhomboid Major/Minor    Sternoclavicular joint    Acromioclavicular joint 1   Coracoid process 1   Long head of biceps 2   Supraspinatus 2   Infraspinatus 2   Subscapularis    Teres Minor    Teres Major    Pectoralis Major    Pectoralis Minor    Anterior Deltoid 1   Lateral Deltoid 1   Posterior Deltoid 1   Latissimus Dorsi    Sternocleidomastoid    (Blank rows = not tested) Graded on 0-4 scale (0 = no pain, 1 = pain, 2 = pain with wincing/grimacing/flinching, 3 = pain with withdrawal, 4 = unwilling to allow palpation), (Blank rows = not tested)    SPECIAL TESTS Rotator Cuff  Drop Arm Test: Negative Painful Arc (Pain from 60 to 120 degrees scaption): Negative Infraspinatus Muscle Test: Negative  Subacromial Impingement Hawkins-Kennedy: Negative Neer (Block scapula, PROM flexion): Negative Painful Arc (Pain from 60 to 120 degrees scaption): Negative Empty Can: Negative (less painful in internally rotated position) External Rotation Resistance: Negative Horizontal Adduction: Negative Scapular Assist: Positive   Bicep Tendon Pathology Speed (shoulder flexion to 90, external rotation, full elbow extension, and forearm supination with resistance: Positive Yergason's (resisted shoulder ER and supination/biceps tendon pathology):  Positive   Accessory Motions/Glides (updated 07/15/24) Glenohumeral: WNL with no notable mobility deficits Scapulothoracic: Mild hypomobility with protraction and upward rotation      TODAY'S TREATMENT     09/04/2024   SUBJECTIVE STATEMENT:   Patient reports 1/10 pain at arrival. Patient reports multiple regions of musculoskeletal pain outside of upper quarter pain for which she was referred. Pt is following up with Krystal Doyne, PA-C tomorrow to discuss polyarthralgia.    Therapeutic Exercise - for improved soft tissue flexibility and extensibility as needed for ROM, improved strength  as needed to improve capacity for overhead activity and lifting   Upper body ergometer, 2 minutes forward, 2 minutes backward - for tissue warm-up to improve muscle performance, improved soft tissue mobility/extensibility - subjective obtained during this time  Sidelying external rotation, 4-lb weight; 2 x 10   -pain-free ROM only   PATIENT EDUCATION: HEP updated and reviewed.    *not today* Supine shoulder flexion with 3-lb Dbell 2 x 10 Supine shoulder flexion AROM; 1 x 12 Theraband external rotation isometric walkout, Red Tband; 2 x 10 Theraband flexion isometric walkout, Red Tband; 2 x 10 Theraband internal rotation isometric walkout, Red Tband; 2 x 10 Shoulder isometrics, at wall with folded towel; flexion, ER; reviewed    Neuromuscular Re-education - soft tissue work and mechanical stimulation for desensitization/neuromodulatory effect; for scapular control/scapulothoracic stability, postural re-edu  Rhythmic stabilization, with 5-lb Dbell; 1 x 3 min   STM/DTM/TPR: Bilat splenius cervicis C3-6, bilat upper trapezius; x 8 minutes  Nautilus lat pulldown; with straight bar; 2x10  with 70-lb;  3 sec hold with scap retraction/depression  -also working into full overhead motion with eccentric phase  Nautilus chest press with straight bar; 1 x 12 with 20-lb;    Standing bilat scaption; 1x10  with 3-lb Dbells, 1x10 with 2-lb Dbells   -mirror feedback for technique/angle    *not today* Standing mid-row with Black Tband; 2 x 10; 3 sec hold  -tactile cueing for scap retraction/depression Serratus slide at wall with foam roller; 2 x10   -yellow Tband around wrists for posterior cuff activation with serratus slide Wall slide with pillow case, pushing into ER for posterior cuff isometric; 2 x 10 Bilat UT DTM/TPR; x 5 min High row; 2 x 10, Green Tband; 2 x 10, 3 sec hold  Serratus punch, supine; 2 x 10, with 4-lb Dbells       PATIENT EDUCATION:  Education details: see above for patient education details Person educated: Patient Education method: Explanation, demonstration, handouts Education comprehension: verbalized understanding and returned demonstration   HOME EXERCISE PROGRAM:  Access Code: [email or web address] URL: [email or web address] Date: 08/28/2024 Prepared by: Venetia Endo  Exercises - Standing Shoulder Posterior Capsule Stretch  - 2 x daily - 7 x weekly - 3 sets - 30sec hold - Seated Upper Trapezius Stretch  - 2 x daily - 7 x weekly - 3 sets - 30sec hold - Shoulder External Rotation Reactive Isometrics  - 2 x daily - 7 x weekly - 2 sets - 10 reps - Standing Shoulder Flexion Reactive Isometrics with Elbow Extended  - 2 x daily - 7 x weekly - 2 sets - 10 reps - Standing Shoulder Row with Anchored Resistance  - 2 x daily - 7 x weekly - 2 sets - 10 reps - Scaption with Dumbbells  - 1 x daily - 7 x weekly - 2 sets - 10 reps   ASSESSMENT:  CLINICAL IMPRESSION: Patient has polyarthralgia affecting hands, L hip, and feet. He is following up with Krystal Doyne, PA-C on 09/05/24 to discuss these issues. Pt reports continuous pain affecting cervicothoracic region during her long work hour at computer. She feels that pain is minimal at arrival this AM and has progressed well with in-office active intervention and HEP. Pt's various musculoskeletal issues may warrant  new referral pending f/u with orthopedics. Pt has current deficits in: L shoulder AROM, dec shoulder flexor/abductor and posterior cuff strength, postural dysfunction/protracted and anteriorly tilted scapulae, and sensitivity along L bicipital tendon region/UT/supraspinatus/infraspinatus. Pt will continue to  benefit from skilled PT services to address deficits and improve function.  OBJECTIVE IMPAIRMENTS: decreased ROM, decreased strength, impaired flexibility, impaired UE functional use, postural dysfunction, and pain.   ACTIVITY LIMITATIONS: carrying, lifting, and reach over head  PARTICIPATION LIMITATIONS: meal prep and community activity  PERSONAL FACTORS: Past/current experiences, Time since onset of injury/illness/exacerbation, and 3+ comorbidities: (anemia, anxiety, atherosclerosis, asthma, Type 2 DM, HTN, OA) are also affecting patient's functional outcome.   REHAB POTENTIAL: Good  CLINICAL DECISION MAKING: Evolving/moderate complexity  EVALUATION COMPLEXITY: Moderate   GOALS: Goals reviewed with patient? Yes  SHORT TERM GOALS: Target date: 08/01/2024  Pt will be independent with HEP to improve strength and decrease shoulder pain to improve pain-free function at home and work. Baseline: 07/10/24: Baseline HEP initiated 08/12/24: Pt is mostly compliant, she states I could do better.  Goal status: PARTIALLY MET    LONG TERM GOALS: Target date: 08/29/2024  Pt will have L shoulder AROM within 5 degrees of contralateral upper limb or greater for all planes of motion tested without reproduction of pain as needed for reaching, household chores, and lifting tasks.  Baseline:  07/10/24: Deficit relative to RUE with flexion, abduction, ER.  08/12/24: Met for ER and IR; not met for flexion/abduction.  Goal status: ON-GOING  2.  Pt will decrease worst shoulder pain by at least 3 points on the NPRS in order to demonstrate clinically significant reduction in shoulder pain. Baseline: 07/10/24:  10/10 at worst  08/12/24: 6/10 at worst.  Goal status: ACHIEVED   3.  Pt will decrease quick DASH score by at least 8% in order to demonstrate clinically significant reduction in disability related to shoulder pain        Baseline: 07/10/24: 47.7% 08/12/24: 34.09% Goal status: ACHIEVED  4. Pt will increase strength to at least 4/5 or greater MMT grade in order to demonstrate improvement in strength and function for L shoulder/L upper limb      Baseline: 07/10/24: L shoulder MMTs 3+ to 4- with exception of strong and painful IR.  08/12/24: MMTs to 4/5 or greater for ER/IR, not met for flexion/ABD (long-lever resistance) Goal status: ON-GOING   PLAN: PT FREQUENCY: 1-2x/week  PT DURATION: 4 weeks  PLANNED INTERVENTIONS: Therapeutic exercises, Therapeutic activity, Neuromuscular re-education, Balance training, Gait training, Patient/Family education, Self Care, Joint mobilization, Joint manipulation, Vestibular training, Canalith repositioning, Orthotic/Fit training, DME instructions, Dry Needling, Electrical stimulation, Spinal manipulation, Spinal mobilization, Cryotherapy, Moist heat, Taping, Traction, Ultrasound, Ionotophoresis 4mg /ml Dexamethasone , Manual therapy, and Re-evaluation.  PLAN FOR NEXT SESSION: Continue with scapular control drills. Progressive long-lever resistance drills for flexion/ABD, posterior cuff strengthening. Periscapular isotonics.     Venetia Endo, PT, DPT #[id number]  Venetia ONEIDA Endo, PT 09/04/2024, 8:32 AM

## 2024-09-11 ENCOUNTER — Ambulatory Visit: Attending: Orthopedic Surgery | Admitting: Physical Therapy

## 2024-09-11 ENCOUNTER — Encounter: Payer: Self-pay | Admitting: Physical Therapy

## 2024-09-11 DIAGNOSIS — M25512 Pain in left shoulder: Secondary | ICD-10-CM | POA: Insufficient documentation

## 2024-09-11 DIAGNOSIS — M5030 Other cervical disc degeneration, unspecified cervical region: Secondary | ICD-10-CM | POA: Insufficient documentation

## 2024-09-11 DIAGNOSIS — M6281 Muscle weakness (generalized): Secondary | ICD-10-CM | POA: Diagnosis present

## 2024-09-11 DIAGNOSIS — M542 Cervicalgia: Secondary | ICD-10-CM | POA: Insufficient documentation

## 2024-09-11 NOTE — Therapy (Signed)
 OUTPATIENT PHYSICAL THERAPY TREATMENT   Patient Name: Andrea Zhang MRN: [id number] DOB:Mar 23, 1961, 63 y.o., female Today's Date: 09/11/2024  END OF SESSION:  PT End of Session - 09/11/24 0737     Visit Number 16    Number of Visits 17    Date for Recertification  08/29/24    Authorization Type BCBS 2025, 30 visits    PT Start Time [phone]    PT Stop Time 0815    PT Time Calculation (min) 42 min    Behavior During Therapy WFL for tasks assessed/performed             Past Medical History:  Diagnosis Date   Anemia    iron  deficiency and b12 deficiency   Anxiety    Aortic atherosclerosis    Arthritis    Asthma    Diabetes mellitus without complication (HCC)    Fasciitis    left foot   GERD (gastroesophageal reflux disease)    History of kidney stones    Hypercholesterolemia    Hypertension    Migraines    MIGRAINES HAVE IMPROVED SINCE RECEIVING IRON    Pneumonia    Primary osteoarthritis of right knee    Tachycardia    Past Surgical History:  Procedure Laterality Date   CHOLECYSTECTOMY  2004   COLONOSCOPY WITH ESOPHAGOGASTRODUODENOSCOPY (EGD)  02/2018   ESOPHAGOGASTRODUODENOSCOPY N/A 10/16/2020   Procedure: ESOPHAGOGASTRODUODENOSCOPY (EGD);  Surgeon: Maryruth Ole DASEN, MD;  Location: [facility] ENDOSCOPY;  Service: Endoscopy;  Laterality: N/A;   ESOPHAGOGASTRODUODENOSCOPY (EGD) WITH PROPOFOL  N/A 06/06/2016   Procedure: ESOPHAGOGASTRODUODENOSCOPY (EGD) WITH PROPOFOL ;  Surgeon: Lamar DASEN Holmes, MD;  Location: [facility] ENDOSCOPY;  Service: Endoscopy;  Laterality: N/A;   EXTRACORPOREAL SHOCK WAVE LITHOTRIPSY  2010   JOINT REPLACEMENT  2013   LT TKR   SAVORY DILATION N/A 06/06/2016   Procedure: SAVORY DILATION;  Surgeon: Lamar DASEN Holmes, MD;  Location: [facility] ENDOSCOPY;  Service: Endoscopy;  Laterality: N/A;   SAVORY DILATION  02/2018   SHOULDER ARTHROSCOPY WITH OPEN ROTATOR CUFF REPAIR Right 05/24/2018   Procedure: right shoulder arthroscopy, extensive arthroscopic  debridement, decompression, open rotator cuff repair, biceps tenodesis;  Surgeon: Edie Norleen PARAS, MD;  Location: [facility] ORS;  Service: Orthopedics;  Laterality: Right;   TONSILLECTOMY  1978   TOTAL KNEE ARTHROPLASTY Right 08/15/2023   Procedure: TOTAL KNEE ARTHROPLASTY;  Surgeon: Edie Norleen PARAS, MD;  Location: [facility] ORS;  Service: Orthopedics;  Laterality: Right;   Patient Active Problem List   Diagnosis Date Noted   Status post total knee replacement using cement, right 08/15/2023   Coronary artery calcification 09/09/2019   Aortic atherosclerosis 09/09/2019   Allergic rhinitis 08/06/2019   Asthma 08/06/2019   Diabetes mellitus, type 2 (HCC) 08/06/2019   Dysphagia 08/06/2019   GERD (gastroesophageal reflux disease) 08/06/2019   Hypercalcemia 08/06/2019   Hypertension 08/06/2019   Migraines 08/06/2019   Osteoarthritis 08/06/2019   Folate deficiency 03/03/2019   Morbid obesity with BMI of 40.0-44.9, adult (HCC) 06/05/2018   Status post right rotator cuff repair 06/05/2018   Degenerative joint disease of right shoulder 05/24/2018   Degenerative tear of glenoid labrum, right 05/24/2018   Injury of tendon of long head of right biceps 05/24/2018   Nontraumatic complete tear of right rotator cuff 05/14/2018   Rotator cuff tendinitis, right 05/14/2018   Iron  deficiency anemia 01/15/2018   B12 deficiency 09/07/2017   Microalbuminuric diabetic nephropathy (HCC) 09/07/2017   Primary osteoarthritis of right knee 02/19/2015   Restless leg syndrome 10/25/2012   Hyperlipidemia with  target LDL less than 100 05/20/2012    PCP: Lauraine Lamarr Leak, NP  REFERRING PROVIDER: Krystal Doyne, PA-C  REFERRING DIAG:  [id number] (ICD-10-CM) - Bursitis of left shoulder  M50.30 (ICD-10-CM) - Other cervical disc degeneration, unspecified cervical region  M54.2 (ICD-10-CM) - Cervicalgia    RATIONALE FOR EVALUATION AND TREATMENT: Rehabilitation  THERAPY DIAG: Left shoulder pain, unspecified chronicity  Other  cervical disc degeneration, unspecified cervical region  Cervicalgia  Muscle weakness (generalized)  ONSET DATE: Last 3 months, progressively worsening   FOLLOW-UP APPT SCHEDULED WITH REFERRING PROVIDER: No   PERTINENT HISTORY: Pt  is a 63 year old female referred for L upper quarter pain with Hx of bursitis and cervical DDD.   Atraumatic onset of L shoulder/paracervical pain. Pt reports having notable pain along anterior deltopectoral groove initially, more recently along posterior cuff/posterior deltoid region. Pt reports notable challenge with folding blanket and with reaching L arm forward - sensation of catching. Pain with turning arm outward. Hx of injections in shoulder with short-term benefit. Pt reports trigger point injections with short-term relief of occipital neuralgia/headaches, but these came back with severe intensity. (-) rheumatoid factor. Pt reports doing okay with donning/doffing bra and grooming/self-care ADLs. Hx of R RCR 2019. Pt has migraine Hx and is awaiting botox treatment.   PAIN:  Pain Intensity: Present: 3/10, Best: 0/10, Worst: 10/10 Pain location: Pain into L upper trap, L posterior cuff/posterior deltoid Pain Quality: sharp pain intermittently, dull pain at rest Radiating: Yes , intermittent periscapular pain and pain sometimes to R side also  Numbness/Tingling: No Focal Weakness: Yes, difficulty gripping, aching in her hands  Aggravating factors: forward reaching, outward reaching/external rotation, folding blankets, reaching out of driver's window Relieving factors: not moving, TENS unit 24-hour pain behavior: worse later in day  History of prior shoulder or neck/shoulder injury, pain, surgery, or therapy: No, only for contralateral shoulder Hx of RCR  Falls: Has patient fallen in last 6 months? No, Number of falls: N/A Dominant hand: right  Imaging: No   Red flags (personal history of cancer, chills/fever, night sweats, nausea, vomiting, unrelenting  pain, unexplained weight gain/loss): Positive for nausea only   -Nausea with migraines    PRECAUTIONS: None  WEIGHT BEARING RESTRICTIONS: No  FALLS: Has patient fallen in last 6 months? 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   Hobbies: Walking exercise after dinner, sewing, genealogy online  Patient Goals: Reduce pain, strengthen upper limb     OBJECTIVE (data from initial evaluation unless otherwise dated):   Patient Surveys  QuickDASH: 47.7%  Posture Rounded shoulders/protracted scapulae, mild kyphotic posturing  Cervical Screen AROM: WFL Spurlings A (ipsilateral lateral flexion/axial compression): R: Negative (localized pain R paracervical) L: Negative (pain L occipital region) Distraction/compression: Negative Repeated movement: No centralization or peripheralization with protraction or retraction Hoffman Sign (cervical cord compression): R: Negative L: Negative  AROM AROM (Normal range in degrees) AROM 07/10/24 AROM 08/12/24  Cervical   Flexion (50) 40 48  Extension (80) 53 55  Right lateral flexion (45) 43 38  Left lateral flexion (45) 35* 32  Right rotation (85) 75 77  Left rotation (85) 68 67   Right Left Right Left  Shoulder      Flexion 173 163 172 156  Extension  Abduction WNL 172 176 168  External Rotation 75 66 78 83  Internal Rotation WNL 67 WNL 65  Hands Behind Head      Hands Behind Back            Elbow      Flexion      Extension      Pronation      Supination      (* = pain; Blank rows = not tested)  UE MMT: MMT (out of 5) Right 07/10/24 Left 07/10/24 Right 08/12/24 Left 08/12/24        Shoulder     Flexion 4 4-* 4 4-*  Extension      Abduction 4-* 3+* 4* (mild pain) 3+*  External rotation 4-  4- 4 4  Internal rotation 4+ 4+* 4+ 4+* (mild pain)  Horizontal abduction      Horizontal adduction      Lower Trapezius      Rhomboids            Elbow    Flexion 5 5    Extension 5 5    Pronation      Supination            (* = pain; Blank rows = not tested)  Sensation Grossly intact to light touch bilateral UE as determined by testing dermatomes C2-T2. Proprioception and hot/cold testing deferred on this date.  Reflexes R/L Biceps (L3/4): 2+/2+  Triceps (S1/2): 2+/2+  Brachioradialis: 2+/2  Palpation Location LEFT  RIGHT           Subocciptials    Cervical paraspinals 1   Upper Trapezius 2   Levator Scapulae    Rhomboid Major/Minor    Sternoclavicular joint    Acromioclavicular joint 1   Coracoid process 1   Long head of biceps 2   Supraspinatus 2   Infraspinatus 2   Subscapularis    Teres Minor    Teres Major    Pectoralis Major    Pectoralis Minor    Anterior Deltoid 1   Lateral Deltoid 1   Posterior Deltoid 1   Latissimus Dorsi    Sternocleidomastoid    (Blank rows = not tested) Graded on 0-4 scale (0 = no pain, 1 = pain, 2 = pain with wincing/grimacing/flinching, 3 = pain with withdrawal, 4 = unwilling to allow palpation), (Blank rows = not tested)    SPECIAL TESTS Rotator Cuff  Drop Arm Test: Negative Painful Arc (Pain from 60 to 120 degrees scaption): Negative Infraspinatus Muscle Test: Negative  Subacromial Impingement Hawkins-Kennedy: Negative Neer (Block scapula, PROM flexion): Negative Painful Arc (Pain from 60 to 120 degrees scaption): Negative Empty Can: Negative (less painful in internally rotated position) External Rotation Resistance: Negative Horizontal Adduction: Negative Scapular Assist: Positive   Bicep Tendon Pathology Speed (shoulder flexion to 90, external rotation, full elbow extension, and forearm supination with resistance: Positive Yergason's (resisted shoulder ER and supination/biceps tendon pathology):  Positive   Accessory Motions/Glides (updated 07/15/24) Glenohumeral: WNL with no notable mobility deficits Scapulothoracic: Mild hypomobility with protraction and upward rotation      TODAY'S TREATMENT     09/11/2024   SUBJECTIVE STATEMENT:   Patient reports starting prednisone  taper per Krystal Doyne, PA-C. Patient reports having botox injection for migraine management yesterday. Pt was instructed to refrain from direct pressure onto injected areas (paracervical, suboccipital, periorbital, upper traps) for 24 hours. Pt follows up with podiatry after this appointment today.    Therapeutic Exercise - for improved  soft tissue flexibility and extensibility as needed for ROM, improved strength as needed to improve capacity for overhead activity and lifting   Upper body ergometer, 2 minutes forward, 2 minutes backward - for tissue warm-up to improve muscle performance, improved soft tissue mobility/extensibility - subjective obtained during this time  Sidelying external rotation, 4-lb weight; 2 x 15   -pain-free ROM only  PATIENT EDUCATION: discussed current progress and POC.    *not today* Supine shoulder flexion with 3-lb Dbell 2 x 10 Supine shoulder flexion AROM; 1 x 12 Theraband external rotation isometric walkout, Red Tband; 2 x 10 Theraband flexion isometric walkout, Red Tband; 2 x 10 Theraband internal rotation isometric walkout, Red Tband; 2 x 10 Shoulder isometrics, at wall with folded towel; flexion, ER; reviewed    Neuromuscular Re-education - soft tissue work and mechanical stimulation for desensitization/neuromodulatory effect; for scapular control/scapulothoracic stability, postural re-edu  Rhythmic stabilization, with 5-lb Dbell; 1 x 3 min   Nautilus standing row; 30-lbs, 2 single handles; 2 x 10  Nautilus lat pulldown; with straight bar; 2x12  with 70-lb;  3 sec hold with scap retraction/depression  -also working into full overhead motion with eccentric  phase  Nautilus chest press with straight bar; 1 x 15 with 20-lb;    Standing bilat scaption; 2x10 with 2-lb Dbells   -mirror feedback for technique/angle    *not today* STM/DTM/TPR: Bilat splenius cervicis C3-6, bilat upper trapezius; x 8 minutes Standing mid-row with Black Tband; 2 x 10; 3 sec hold  -tactile cueing for scap retraction/depression Serratus slide at wall with foam roller; 2 x10   -yellow Tband around wrists for posterior cuff activation with serratus slide Wall slide with pillow case, pushing into ER for posterior cuff isometric; 2 x 10 Bilat UT DTM/TPR; x 5 min High row; 2 x 10, Green Tband; 2 x 10, 3 sec hold  Serratus punch, supine; 2 x 10, with 4-lb Dbells       PATIENT EDUCATION:  Education details: see above for patient education details Person educated: Patient Education method: Explanation, demonstration, handouts Education comprehension: verbalized understanding and returned demonstration   HOME EXERCISE PROGRAM:  Access Code: [email or web address] URL: [email or web address] Date: 08/28/2024 Prepared by: Venetia Endo  Exercises - Standing Shoulder Posterior Capsule Stretch  - 2 x daily - 7 x weekly - 3 sets - 30sec hold - Seated Upper Trapezius Stretch  - 2 x daily - 7 x weekly - 3 sets - 30sec hold - Shoulder External Rotation Reactive Isometrics  - 2 x daily - 7 x weekly - 2 sets - 10 reps - Standing Shoulder Flexion Reactive Isometrics with Elbow Extended  - 2 x daily - 7 x weekly - 2 sets - 10 reps - Standing Shoulder Row with Anchored Resistance  - 2 x daily - 7 x weekly - 2 sets - 10 reps - Scaption with Dumbbells  - 1 x daily - 7 x weekly - 2 sets - 10 reps   ASSESSMENT:  CLINICAL IMPRESSION: Patient is undergoing further testing to check for inflammatory markers or autoimmune factors contributing to polyarthralgia. She has markedly improved symptoms overall with recent prednisone  taper (prescribed by Krystal Doyne, PA-C). She is  following up with podiatry today to discuss comorbid plantar foot pain. Pt fortunately has minimal shoulder pain this AM, and she did have mild shoulder symptoms at baseline prior to prednisone  Rx also. We worked on further progressive exercise and modest loading for RTC/LHB given recent steroid Rx. Pt will be  due for goal update next visit due to reaching predicted visit count. Pt has current deficits in: L shoulder AROM, dec shoulder flexor/abductor and posterior cuff strength, postural dysfunction/protracted and anteriorly tilted scapulae, and sensitivity along L bicipital tendon region/UT/supraspinatus/infraspinatus. Pt will continue to benefit from skilled PT services to address deficits and improve function.  OBJECTIVE IMPAIRMENTS: decreased ROM, decreased strength, impaired flexibility, impaired UE functional use, postural dysfunction, and pain.   ACTIVITY LIMITATIONS: carrying, lifting, and reach over head  PARTICIPATION LIMITATIONS: meal prep and community activity  PERSONAL FACTORS: Past/current experiences, Time since onset of injury/illness/exacerbation, and 3+ comorbidities: (anemia, anxiety, atherosclerosis, asthma, Type 2 DM, HTN, OA) are also affecting patient's functional outcome.   REHAB POTENTIAL: Good  CLINICAL DECISION MAKING: Evolving/moderate complexity  EVALUATION COMPLEXITY: Moderate   GOALS: Goals reviewed with patient? Yes  SHORT TERM GOALS: Target date: 08/01/2024  Pt will be independent with HEP to improve strength and decrease shoulder pain to improve pain-free function at home and work. Baseline: 07/10/24: Baseline HEP initiated 08/12/24: Pt is mostly compliant, she states I could do better.  Goal status: PARTIALLY MET    LONG TERM GOALS: Target date: 08/29/2024  Pt will have L shoulder AROM within 5 degrees of contralateral upper limb or greater for all planes of motion tested without reproduction of pain as needed for reaching, household chores, and  lifting tasks.  Baseline:  07/10/24: Deficit relative to RUE with flexion, abduction, ER.  08/12/24: Met for ER and IR; not met for flexion/abduction.  Goal status: ON-GOING  2.  Pt will decrease worst shoulder pain by at least 3 points on the NPRS in order to demonstrate clinically significant reduction in shoulder pain. Baseline: 07/10/24: 10/10 at worst  08/12/24: 6/10 at worst.  Goal status: ACHIEVED   3.  Pt will decrease quick DASH score by at least 8% in order to demonstrate clinically significant reduction in disability related to shoulder pain        Baseline: 07/10/24: 47.7% 08/12/24: 34.09% Goal status: ACHIEVED  4. Pt will increase strength to at least 4/5 or greater MMT grade in order to demonstrate improvement in strength and function for L shoulder/L upper limb      Baseline: 07/10/24: L shoulder MMTs 3+ to 4- with exception of strong and painful IR.  08/12/24: MMTs to 4/5 or greater for ER/IR, not met for flexion/ABD (long-lever resistance) Goal status: ON-GOING   PLAN: PT FREQUENCY: 1-2x/week  PT DURATION: 4 weeks  PLANNED INTERVENTIONS: Therapeutic exercises, Therapeutic activity, Neuromuscular re-education, Balance training, Gait training, Patient/Family education, Self Care, Joint mobilization, Joint manipulation, Vestibular training, Canalith repositioning, Orthotic/Fit training, DME instructions, Dry Needling, Electrical stimulation, Spinal manipulation, Spinal mobilization, Cryotherapy, Moist heat, Taping, Traction, Ultrasound, Ionotophoresis 4mg /ml Dexamethasone , Manual therapy, and Re-evaluation.  PLAN FOR NEXT SESSION: Continue with scapular control drills. Progressive long-lever resistance drills for flexion/ABD, posterior cuff strengthening. Periscapular isotonics.  Goal update next visit.    Venetia Endo, PT, DPT #[id number]  Venetia ONEIDA Endo, PT 09/11/2024, 8:19 AM

## 2024-09-18 ENCOUNTER — Ambulatory Visit: Admitting: Physical Therapy

## 2024-09-18 DIAGNOSIS — M25512 Pain in left shoulder: Secondary | ICD-10-CM | POA: Diagnosis not present

## 2024-09-18 DIAGNOSIS — M542 Cervicalgia: Secondary | ICD-10-CM

## 2024-09-18 DIAGNOSIS — M6281 Muscle weakness (generalized): Secondary | ICD-10-CM

## 2024-09-18 DIAGNOSIS — M5030 Other cervical disc degeneration, unspecified cervical region: Secondary | ICD-10-CM

## 2024-09-18 NOTE — Therapy (Signed)
 OUTPATIENT PHYSICAL THERAPY TREATMENT/GOAL UPDATE AND DISCHARGE   Patient Name: Andrea Zhang MRN: [id number] DOB:12-Feb-1961, 63 y.o., female Today's Date: 09/18/2024  END OF SESSION:  PT End of Session - 09/18/24 0735     Visit Number 17    Number of Visits 17    Date for Recertification  08/29/24    Authorization Type BCBS 2025, 30 visits    PT Start Time 0735    PT Stop Time 0821    PT Time Calculation (min) 46 min    Behavior During Therapy [facility] for tasks assessed/performed             Past Medical History:  Diagnosis Date   Anemia    iron  deficiency and b12 deficiency   Anxiety    Aortic atherosclerosis    Arthritis    Asthma    Diabetes mellitus without complication (HCC)    Fasciitis    left foot   GERD (gastroesophageal reflux disease)    History of kidney stones    Hypercholesterolemia    Hypertension    Migraines    MIGRAINES HAVE IMPROVED SINCE RECEIVING IRON    Pneumonia    Primary osteoarthritis of right knee    Tachycardia    Past Surgical History:  Procedure Laterality Date   CHOLECYSTECTOMY  2004   COLONOSCOPY WITH ESOPHAGOGASTRODUODENOSCOPY (EGD)  02/2018   ESOPHAGOGASTRODUODENOSCOPY N/A 10/16/2020   Procedure: ESOPHAGOGASTRODUODENOSCOPY (EGD);  Surgeon: Maryruth Ole DASEN, MD;  Location: [facility] ENDOSCOPY;  Service: Endoscopy;  Laterality: N/A;   ESOPHAGOGASTRODUODENOSCOPY (EGD) WITH PROPOFOL  N/A 06/06/2016   Procedure: ESOPHAGOGASTRODUODENOSCOPY (EGD) WITH PROPOFOL ;  Surgeon: Lamar DASEN Holmes, MD;  Location: [facility] ENDOSCOPY;  Service: Endoscopy;  Laterality: N/A;   EXTRACORPOREAL SHOCK WAVE LITHOTRIPSY  2010   JOINT REPLACEMENT  2013   LT TKR   SAVORY DILATION N/A 06/06/2016   Procedure: SAVORY DILATION;  Surgeon: Lamar DASEN Holmes, MD;  Location: [facility] ENDOSCOPY;  Service: Endoscopy;  Laterality: N/A;   SAVORY DILATION  02/2018   SHOULDER ARTHROSCOPY WITH OPEN ROTATOR CUFF REPAIR Right 05/24/2018   Procedure: right shoulder arthroscopy,  extensive arthroscopic debridement, decompression, open rotator cuff repair, biceps tenodesis;  Surgeon: Edie Norleen PARAS, MD;  Location: [facility] ORS;  Service: Orthopedics;  Laterality: Right;   TONSILLECTOMY  1978   TOTAL KNEE ARTHROPLASTY Right 08/15/2023   Procedure: TOTAL KNEE ARTHROPLASTY;  Surgeon: Edie Norleen PARAS, MD;  Location: [facility] ORS;  Service: Orthopedics;  Laterality: Right;   Patient Active Problem List   Diagnosis Date Noted   Status post total knee replacement using cement, right 08/15/2023   Coronary artery calcification 09/09/2019   Aortic atherosclerosis 09/09/2019   Allergic rhinitis 08/06/2019   Asthma 08/06/2019   Diabetes mellitus, type 2 (HCC) 08/06/2019   Dysphagia 08/06/2019   GERD (gastroesophageal reflux disease) 08/06/2019   Hypercalcemia 08/06/2019   Hypertension 08/06/2019   Migraines 08/06/2019   Osteoarthritis 08/06/2019   Folate deficiency 03/03/2019   Morbid obesity with BMI of 40.0-44.9, adult (HCC) 06/05/2018   Status post right rotator cuff repair 06/05/2018   Degenerative joint disease of right shoulder 05/24/2018   Degenerative tear of glenoid labrum, right 05/24/2018   Injury of tendon of long head of right biceps 05/24/2018   Nontraumatic complete tear of right rotator cuff 05/14/2018   Rotator cuff tendinitis, right 05/14/2018   Iron  deficiency anemia 01/15/2018   B12 deficiency 09/07/2017   Microalbuminuric diabetic nephropathy (HCC) 09/07/2017   Primary osteoarthritis of right knee 02/19/2015   Restless leg syndrome 10/25/2012  Hyperlipidemia with target LDL less than 100 05/20/2012    PCP: Lauraine Lamarr Leak, NP  REFERRING PROVIDER: Krystal Doyne, PA-C  REFERRING DIAG:  [phone] (ICD-10-CM) - Bursitis of left shoulder  M50.30 (ICD-10-CM) - Other cervical disc degeneration, unspecified cervical region  M54.2 (ICD-10-CM) - Cervicalgia    RATIONALE FOR EVALUATION AND TREATMENT: Rehabilitation  THERAPY DIAG: Left shoulder pain,  unspecified chronicity  Other cervical disc degeneration, unspecified cervical region  Cervicalgia  Muscle weakness (generalized)  ONSET DATE: Last 3 months, progressively worsening   FOLLOW-UP APPT SCHEDULED WITH REFERRING PROVIDER: No   PERTINENT HISTORY: Pt  is a 63 year old female referred for L upper quarter pain with Hx of bursitis and cervical DDD.   Atraumatic onset of L shoulder/paracervical pain. Pt reports having notable pain along anterior deltopectoral groove initially, more recently along posterior cuff/posterior deltoid region. Pt reports notable challenge with folding blanket and with reaching L arm forward - sensation of catching. Pain with turning arm outward. Hx of injections in shoulder with short-term benefit. Pt reports trigger point injections with short-term relief of occipital neuralgia/headaches, but these came back with severe intensity. (-) rheumatoid factor. Pt reports doing okay with donning/doffing bra and grooming/self-care ADLs. Hx of R RCR 2019. Pt has migraine Hx and is awaiting botox treatment.   PAIN:  Pain Intensity: Present: 3/10, Best: 0/10, Worst: 10/10 Pain location: Pain into L upper trap, L posterior cuff/posterior deltoid Pain Quality: sharp pain intermittently, dull pain at rest Radiating: Yes , intermittent periscapular pain and pain sometimes to R side also  Numbness/Tingling: No Focal Weakness: Yes, difficulty gripping, aching in her hands  Aggravating factors: forward reaching, outward reaching/external rotation, folding blankets, reaching out of driver's window Relieving factors: not moving, TENS unit 24-hour pain behavior: worse later in day  History of prior shoulder or neck/shoulder injury, pain, surgery, or therapy: No, only for contralateral shoulder Hx of RCR  Falls: Has patient fallen in last 6 months? No, Number of falls: N/A Dominant hand: right  Imaging: No   Red flags (personal history of cancer, chills/fever, night sweats,  nausea, vomiting, unrelenting pain, unexplained weight gain/loss): Positive for nausea only   -Nausea with migraines    PRECAUTIONS: None  WEIGHT BEARING RESTRICTIONS: No  FALLS: Has patient fallen in last 6 months? 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   Hobbies: Walking exercise after dinner, sewing, genealogy online  Patient Goals: Reduce pain, strengthen upper limb     OBJECTIVE (data from initial evaluation unless otherwise dated):   Patient Surveys  QuickDASH: 47.7%  Posture Rounded shoulders/protracted scapulae, mild kyphotic posturing  Cervical Screen AROM: WFL Spurlings A (ipsilateral lateral flexion/axial compression): R: Negative (localized pain R paracervical) L: Negative (pain L occipital region) Distraction/compression: Negative Repeated movement: No centralization or peripheralization with protraction or retraction Hoffman Sign (cervical cord compression): R: Negative L: Negative  AROM AROM (Normal range in degrees) AROM 07/10/24 AROM 08/12/24 AROM 09/18/24  Cervical    Flexion (50) 40 48 60  Extension (80) 53 55 60*  Right lateral flexion (45) 43 38 50  Left lateral flexion (45) 35* 32 45  Right rotation (85) 75 77 82  Left rotation (85) 68 67 77   Right Left Right Left Right Left  Shoulder  Flexion 173 163 172 156 167 159  Extension        Abduction WNL 172 176 168 178 168  External Rotation 75 66 78 83 67 87  Internal Rotation WNL 67 WNL 65 WNL WNL  Hands Behind Head        Hands Behind Back                Elbow        Flexion        Extension        Pronation        Supination        (* = pain; Blank rows = not tested)  UE MMT: MMT (out of 5) Right 07/10/24 Left 07/10/24 Right 08/12/24  Left 08/12/24 Right 09/18/24 Left 09/18/24          Shoulder       Flexion 4 4-* 4 4-* 4+ 4+  Extension        Abduction 4-* 3+* 4* (mild pain) 3+* 4 4*  External rotation 4- 4- 4 4 4 4   Internal rotation 4+ 4+* 4+ 4+* (mild pain) 5 4+  Horizontal abduction        Horizontal adduction        Lower Trapezius        Rhomboids                Elbow      Flexion 5 5      Extension 5 5      Pronation        Supination                (* = pain; Blank rows = not tested)  Sensation Grossly intact to light touch bilateral UE as determined by testing dermatomes C2-T2. Proprioception and hot/cold testing deferred on this date.  Reflexes R/L Biceps (L3/4): 2+/2+  Triceps (S1/2): 2+/2+  Brachioradialis: 2+/2  Palpation Location LEFT  RIGHT           Subocciptials    Cervical paraspinals 1   Upper Trapezius 2   Levator Scapulae    Rhomboid Major/Minor    Sternoclavicular joint    Acromioclavicular joint 1   Coracoid process 1   Long head of biceps 2   Supraspinatus 2   Infraspinatus 2   Subscapularis    Teres Minor    Teres Major    Pectoralis Major    Pectoralis Minor    Anterior Deltoid 1   Lateral Deltoid 1   Posterior Deltoid 1   Latissimus Dorsi    Sternocleidomastoid    (Blank rows = not tested) Graded on 0-4 scale (0 = no pain, 1 = pain, 2 = pain with wincing/grimacing/flinching, 3 = pain with withdrawal, 4 = unwilling to allow palpation), (Blank rows = not tested)    SPECIAL TESTS Rotator Cuff  Drop Arm Test: Negative Painful Arc (Pain from 60 to 120 degrees scaption): Negative Infraspinatus Muscle Test: Negative  Subacromial Impingement Hawkins-Kennedy: Negative Neer (Block scapula, PROM flexion): Negative Painful Arc (Pain from 60 to 120 degrees scaption): Negative Empty Can: Negative (less painful in internally rotated position) External Rotation Resistance: Negative Horizontal Adduction: Negative Scapular Assist: Positive   Bicep Tendon  Pathology Speed (shoulder flexion to 90, external rotation, full elbow extension, and forearm supination with resistance: Positive Yergason's (resisted shoulder ER and supination/biceps tendon pathology): Positive   Accessory Motions/Glides (updated 07/15/24) Glenohumeral: WNL with no notable mobility deficits Scapulothoracic: Mild  hypomobility with protraction and upward rotation      TODAY'S TREATMENT     09/18/2024   SUBJECTIVE STATEMENT:   Patient is at end of steroid taper. Pt had f/u with podiatry and was advised on proper footwear- got X-rays noting hallux valgus and pes planus deformity. Patient reports doing relatively well with L shoulder condition. She feels she is doing well with computer work and household chores. Pt agrees with transitioning to HEP following completion of final scheduled visit today.     *GOAL UPDATE PERFORMED   Therapeutic Exercise - for improved soft tissue flexibility and extensibility as needed for ROM, improved strength as needed to improve capacity for overhead activity and lifting   Upper body ergometer, 2 minutes forward, 2 minutes backward - for tissue warm-up to improve muscle performance, improved soft tissue mobility/extensibility - subjective obtained during this time  Sidelying external rotation, 4-lb weight; 2 x 15   -pain-free ROM only  PATIENT EDUCATION: discussed current progress and continuing with discharge HEP. HEP reviewed and we discussed principles of exercise progression.    *not today* Supine shoulder flexion with 3-lb Dbell 2 x 10 Supine shoulder flexion AROM; 1 x 12 Theraband external rotation isometric walkout, Red Tband; 2 x 10 Theraband flexion isometric walkout, Red Tband; 2 x 10 Theraband internal rotation isometric walkout, Red Tband; 2 x 10 Shoulder isometrics, at wall with folded towel; flexion, ER; reviewed    Neuromuscular Re-education - soft tissue work and mechanical stimulation for  desensitization/neuromodulatory effect; for scapular control/scapulothoracic stability, postural re-edu   STM/DTM/TPR: Bilat splenius cervicis C3-6, bilat upper trapezius; x 8 minutes  Passive cervical sidebend stretch, bilat; 2 x 30 sec  ____________  Rhythmic stabilization, with 5-lb Dbell; 1 x 3 min      *not today* Nautilus standing row; 30-lbs, 2 single handles; 2 x 10 Nautilus lat pulldown; with straight bar; 2x12  with 70-lb;  3 sec hold with scap retraction/depression  -also working into full overhead motion with eccentric phase Nautilus chest press with straight bar; 1 x 15 with 20-lb;  Standing bilat scaption; 2x10 with 2-lb Dbells   -mirror feedback for technique/angle Standing mid-row with Black Tband; 2 x 10; 3 sec hold  -tactile cueing for scap retraction/depression Serratus slide at wall with foam roller; 2 x10   -yellow Tband around wrists for posterior cuff activation with serratus slide Wall slide with pillow case, pushing into ER for posterior cuff isometric; 2 x 10 Bilat UT DTM/TPR; x 5 min High row; 2 x 10, Green Tband; 2 x 10, 3 sec hold  Serratus punch, supine; 2 x 10, with 4-lb Dbells       PATIENT EDUCATION:  Education details: see above for patient education details Person educated: Patient Education method: Explanation, demonstration, handouts Education comprehension: verbalized understanding and returned demonstration   HOME EXERCISE PROGRAM:  Access Code: [email or web address] URL: [email or web address] Date: 08/28/2024 Prepared by: Venetia Endo  Exercises - Standing Shoulder Posterior Capsule Stretch  - 2 x daily - 7 x weekly - 3 sets - 30sec hold - Seated Upper Trapezius Stretch  - 2 x daily - 7 x weekly - 3 sets - 30sec hold - Shoulder External Rotation Reactive Isometrics  - 2 x daily - 7 x weekly - 2 sets - 10 reps - Standing Shoulder Flexion Reactive Isometrics with Elbow Extended  - 2 x daily - 7 x weekly - 2 sets - 10 reps -  Standing Shoulder Row with Anchored Resistance  - 2  x daily - 7 x weekly - 2 sets - 10 reps - Scaption with Dumbbells  - 1 x daily - 7 x weekly - 2 sets - 10 reps   ASSESSMENT:  CLINICAL IMPRESSION: Patient demonstrates good L shoulder ROM at this time and has significant improvement in MMTs. She previously achieved clinically meaningful improvement in QuickDASH. Pt has clinically significant reduction in NPRS per previous progress note and has voiced minimal shoulder pain recently. Patient does have some cervicalgia without notable radicular symptoms (intermittently worsened with her computer work duties), and she has responded well in short-term with stretching/STM. Pt feels that she is ready for focus on HEP with her current level of progress. Pt is appropriate for discharge for this episode of care; we discussed principles or progression for established HEP.   OBJECTIVE IMPAIRMENTS: decreased ROM, decreased strength, impaired flexibility, impaired UE functional use, postural dysfunction, and pain.   ACTIVITY LIMITATIONS: carrying, lifting, and reach over head  PARTICIPATION LIMITATIONS: meal prep and community activity  PERSONAL FACTORS: Past/current experiences, Time since onset of injury/illness/exacerbation, and 3+ comorbidities: (anemia, anxiety, atherosclerosis, asthma, Type 2 DM, HTN, OA) are also affecting patient's functional outcome.   REHAB POTENTIAL: Good  CLINICAL DECISION MAKING: Evolving/moderate complexity  EVALUATION COMPLEXITY: Moderate   GOALS: Goals reviewed with patient? Yes  SHORT TERM GOALS: Target date: 08/01/2024  Pt will be independent with HEP to improve strength and decrease shoulder pain to improve pain-free function at home and work. Baseline: 07/10/24: Baseline HEP initiated 08/12/24: Pt is mostly compliant, she states I could do better.  09/18/24: Compliant on most days.  Goal status: PARTIALLY MET    LONG TERM GOALS: Target date: 08/29/2024  Pt  will have L shoulder AROM within 5 degrees of contralateral upper limb or greater for all planes of motion tested without reproduction of pain as needed for reaching, household chores, and lifting tasks.  Baseline:  07/10/24: Deficit relative to RUE with flexion, abduction, ER.  08/12/24: Met for ER and IR; not met for flexion/abduction.  09/18/24: Mostly met (within 10 deg of contralateral shoulder).  Goal status: ON-GOING  2.  Pt will decrease worst shoulder pain by at least 3 points on the NPRS in order to demonstrate clinically significant reduction in shoulder pain. Baseline: 07/10/24: 10/10 at worst  08/12/24: 6/10 at worst.  Goal status: ACHIEVED   3.  Pt will decrease quick DASH score by at least 8% in order to demonstrate clinically significant reduction in disability related to shoulder pain        Baseline: 07/10/24: 47.7% 08/12/24: 34.09% Goal status: ACHIEVED  4. Pt will increase strength to at least 4/5 or greater MMT grade in order to demonstrate improvement in strength and function for L shoulder/L upper limb      Baseline: 07/10/24: L shoulder MMTs 3+ to 4- with exception of strong and painful IR.  08/12/24: MMTs to 4/5 or greater for ER/IR, not met for flexion/ABD (long-lever resistance).       09/18/24: Achieved for all directions.  Goal status: ACHIEVED   PLAN: PT FREQUENCY: -  PT DURATION: -  PLANNED INTERVENTIONS: Therapeutic exercises, Therapeutic activity, Neuromuscular re-education, Balance training, Gait training, Patient/Family education, Self Care, Joint mobilization, Joint manipulation, Vestibular training, Canalith repositioning, Orthotic/Fit training, DME instructions, Dry Needling, Electrical stimulation, Spinal manipulation, Spinal mobilization, Cryotherapy, Moist heat, Taping, Traction, Ultrasound, Ionotophoresis 4mg /ml Dexamethasone , Manual therapy, and Re-evaluation.  PLAN FOR NEXT SESSION: Pt to continue with HEP; discharge for current episode of care.  Venetia Endo, PT, DPT #[id number]  Venetia ONEIDA Endo, PT 09/18/2024, 8:21 AM

## 2024-09-19 ENCOUNTER — Encounter: Payer: Self-pay | Admitting: Physical Therapy

## 2024-11-27 ENCOUNTER — Inpatient Hospital Stay: Payer: BC Managed Care – PPO | Admitting: Oncology

## 2024-11-27 ENCOUNTER — Encounter: Payer: Self-pay | Admitting: Oncology

## 2024-11-27 ENCOUNTER — Inpatient Hospital Stay: Payer: BC Managed Care – PPO | Attending: Oncology

## 2024-11-27 VITALS — BP 111/76 | HR 97 | Temp 97.1°F | Resp 18 | Ht 60.0 in | Wt 179.3 lb

## 2024-11-27 DIAGNOSIS — D509 Iron deficiency anemia, unspecified: Secondary | ICD-10-CM | POA: Diagnosis not present

## 2024-11-27 DIAGNOSIS — E538 Deficiency of other specified B group vitamins: Secondary | ICD-10-CM

## 2024-11-27 LAB — CBC
HCT: 44.5 % (ref 36.0–46.0)
Hemoglobin: 14.6 g/dL (ref 12.0–15.0)
MCH: 29.3 pg (ref 26.0–34.0)
MCHC: 32.8 g/dL (ref 30.0–36.0)
MCV: 89.2 fL (ref 80.0–100.0)
Platelets: 311 K/uL (ref 150–400)
RBC: 4.99 MIL/uL (ref 3.87–5.11)
RDW: 13.2 % (ref 11.5–15.5)
WBC: 8.4 K/uL (ref 4.0–10.5)
nRBC: 0 % (ref 0.0–0.2)

## 2024-11-27 LAB — IRON AND TIBC
Iron: 64 ug/dL (ref 28–170)
Saturation Ratios: 20 % (ref 10.4–31.8)
TIBC: 314 ug/dL (ref 250–450)
UIBC: 250 ug/dL

## 2024-11-27 LAB — FERRITIN: Ferritin: 212 ng/mL (ref 11–307)

## 2024-11-27 NOTE — Progress Notes (Signed)
 "    Hematology/Oncology Consult note [facility]  [phone] [phone]  Patient Care Team: Andrea Lauraine Collar, NP as PCP - General (Internal Medicine) Andrea Ladell POUR, MD as Consulting Physician (Gastroenterology) Andrea Annah BROCKS, MD as Consulting Physician (Hematology and Oncology) Andrea Vinie LABOR, MD as Consulting Physician (Cardiology) Andrea Annah BROCKS, MD as Consulting Physician (Hematology and Oncology)   Name of the patient: Andrea Zhang  [id number]  1960/12/16   Date of visit: 11/27/24  Diagnosis-iron  deficiency anemia  Chief complaint/ Reason for visit-routine follow-up of iron  deficiency anemia  Heme/Onc history: patient is a 64 year old female with a past medical history significant for hypertension hyperlipidemia, asthma diabetes osteoarthritis among other medical problems.  She also has chronic arthritis for which she sees Andrea Zhang and has been getting joint injections.  She was seen by her PCP on 01/12/2018 with symptoms of lightheadedness and worsening shortness of breath.  She has been earlier seen by Dr. Bosie from cardiology on 12/19/2017 and underwent stress test and echocardiogram that was normal.  Blood work done on 01/12/2018 was as follows: CBC showed white count of 8.4, H&H of 6.7/22.6 with an MCV of 81 and a platelet count of 324.  Differential on the CBC was normal.  Iron  study showed TIBC that was elevated at 546.  Percentage iron  saturation was 36.  Ferritin levels were not checked.  CMP and TSH were within normal.  B12 levels in November 2018 were low at 241.  Upper endoscopy from July 27 showed a Schatzki's ring which was dilated and evidence of gastritis.  Duodenum was normal.  She has had 2-3 EGDs in the past for strictures requiring dilatation.  She has had colonoscopy back in 2011 which was apparently normal.   Results of blood work from 01/15/2018 were as follows: CBC showed white count of 7.4, H&H of 7.5/24 with  an MCV of 78.2 and a platelet count of 299.  Ferritin levels were low at 7.  Iron  studies showed a low iron  saturation and elevated TIBC of 560 haptoglobin was normal, celiac disease panel was negative, myeloma panel revealed no monoclonal protein.  B12 level was low low at 210.  Reticulocyte count was elevated at 7.5 indicating response to anemia.  Folate level was normal at 7.6.  Urinalysis did not reveal any hematuria.  Stool H. pylori antigen was negative   Patient was [facility] by [facility] and underwent EGD and colonoscopy in May 2019 which was apparently unremarkable.  Patient has not had a capsule endoscopy done yet.  Patient has required multiple IV iron  infusions in the past but none since April 2023    Interval history- Andrea Zhang is a 64 year old female with iron  deficiency anemia who presents for annual hematology follow-up.  She has not experienced any episodes of abnormal bleeding, including hematuria, vaginal bleeding, or gastrointestinal bleeding. Iron  studies from July were within normal limits.  She reports good overall health and satisfactory energy levels. A transient period of increased fatigue earlier in the year has resolved. She has recently used NSAIDs without any adverse effects or bleeding complications.  She previously experienced severe anemia with hemoglobin as low as 7 g/dL, requiring transfusion and a transfusion band, but has not had recurrent episodes since that time.      History of Present Illness  ECOG PS- 1 Pain scale- 0   Review of systems- Review of Systems  Constitutional:  Negative for chills, fever, malaise/fatigue and weight loss.  HENT:  Negative for congestion, ear discharge and nosebleeds.   Eyes:  Negative for blurred vision.  Respiratory:  Negative for cough, hemoptysis, sputum production, shortness of breath and wheezing.   Cardiovascular:  Negative for chest pain, palpitations, orthopnea and claudication.  Gastrointestinal:   Negative for abdominal pain, blood in stool, constipation, diarrhea, heartburn, melena, nausea and vomiting.  Genitourinary:  Negative for dysuria, flank pain, frequency, hematuria and urgency.  Musculoskeletal:  Negative for back pain, joint pain and myalgias.  Skin:  Negative for rash.  Neurological:  Negative for dizziness, tingling, focal weakness, seizures, weakness and headaches.  Endo/Heme/Allergies:  Does not bruise/bleed easily.  Psychiatric/Behavioral:  Negative for depression and suicidal ideas. The patient does not have insomnia.       Allergies[1]   Past Medical History:  Diagnosis Date   Anemia    iron  deficiency and b12 deficiency   Anxiety    Aortic atherosclerosis    Arthritis    Asthma    Diabetes mellitus without complication (HCC)    Fasciitis    left foot   GERD (gastroesophageal reflux disease)    History of kidney stones    Hypercholesterolemia    Hypertension    Migraines    MIGRAINES HAVE IMPROVED SINCE RECEIVING IRON    Pneumonia    Primary osteoarthritis of right knee    Tachycardia      Past Surgical History:  Procedure Laterality Date   CHOLECYSTECTOMY  2004   COLONOSCOPY WITH ESOPHAGOGASTRODUODENOSCOPY (EGD)  02/2018   ESOPHAGOGASTRODUODENOSCOPY N/A 10/16/2020   Procedure: ESOPHAGOGASTRODUODENOSCOPY (EGD);  Surgeon: Andrea Ole DASEN, MD;  Location: [facility] ENDOSCOPY;  Service: Endoscopy;  Laterality: N/A;   ESOPHAGOGASTRODUODENOSCOPY (EGD) WITH PROPOFOL  N/A 06/06/2016   Procedure: ESOPHAGOGASTRODUODENOSCOPY (EGD) WITH PROPOFOL ;  Surgeon: Andrea Zhang Holmes, MD;  Location: [facility] ENDOSCOPY;  Service: Endoscopy;  Laterality: N/A;   EXTRACORPOREAL SHOCK WAVE LITHOTRIPSY  2010   JOINT REPLACEMENT  2013   LT TKR   SAVORY DILATION N/A 06/06/2016   Procedure: SAVORY DILATION;  Surgeon: Andrea Zhang Holmes, MD;  Location: [facility] ENDOSCOPY;  Service: Endoscopy;  Laterality: N/A;   SAVORY DILATION  02/2018   SHOULDER ARTHROSCOPY WITH OPEN ROTATOR CUFF  REPAIR Right 05/24/2018   Procedure: right shoulder arthroscopy, extensive arthroscopic debridement, decompression, open rotator cuff repair, biceps tenodesis;  Surgeon: Edie Norleen PARAS, MD;  Location: [facility] ORS;  Service: Orthopedics;  Laterality: Right;   TONSILLECTOMY  1978   TOTAL KNEE ARTHROPLASTY Right 08/15/2023   Procedure: TOTAL KNEE ARTHROPLASTY;  Surgeon: Edie Norleen PARAS, MD;  Location: [facility] ORS;  Service: Orthopedics;  Laterality: Right;    Social History   Socioeconomic History   Marital status: Married    Spouse name: Rodgers   Number of children: Not on file   Years of education: Not on file   Highest education level: Not on file  Occupational History   Not on file  Tobacco Use   Smoking status: Never   Smokeless tobacco: Never  Vaping Use   Vaping status: Never Used  Substance and Sexual Activity   Alcohol  use: No   Drug use: No   Sexual activity: Not Currently  Other Topics Concern   Not on file  Social History Narrative   Not on file   Social Drivers of Health   Tobacco Use: Low Risk (09/19/2024)   Patient History    Smoking Tobacco Use: Never    Smokeless Tobacco Use: Never    Passive Exposure: Not on file  Financial Resource Strain: Low Risk  (  04/11/2024)   Received from [facility]   Overall Financial Resource Strain (CARDIA)    Difficulty of Paying Living Expenses: Not hard at all  Food Insecurity: No Food Insecurity (04/11/2024)   Received from [facility]   Epic    Within the past 12 months, you worried that your food would run out before you got the money to buy more.: Never true    Within the past 12 months, the food you bought just didn't last and you didn't have money to get more.: Never true  Transportation Needs: No Transportation Needs (04/11/2024)   Received from [facility] - Transportation    In the past 12 months, has lack of transportation kept you from medical appointments or  from getting medications?: No    Lack of Transportation (Non-Medical): No  Physical Activity: Not on file  Stress: Not on file  Social Connections: Not on file  Intimate Partner Violence: Not At Risk (08/15/2023)   Humiliation, Afraid, Rape, and Kick questionnaire    Fear of Current or Ex-Partner: No    Emotionally Abused: No    Physically Abused: No    Sexually Abused: No  Depression (PHQ2-9): Not on file  Alcohol  Screen: Not on file  Housing: Low Risk  (04/11/2024)   Received from [facility]   Epic    In the last 12 months, was there a time when you were not able to pay the mortgage or rent on time?: No    In the past 12 months, how many times have you moved where you were living?: 0    At any time in the past 12 months, were you homeless or living in a shelter (including now)?: No  Utilities: Not At Risk (04/11/2024)   Received from [facility] Utilities    Threatened with loss of utilities: No  Health Literacy: Not on file    Family History  Problem Relation Age of Onset   COPD Mother    Heart disease Mother    Asthma Mother    Schizophrenia Mother    Hemophilia Father    Prostate cancer Father    Heart disease Father    Epilepsy Sister    Stroke Sister    Alcohol  abuse Sister    Hypertension Sister    Epilepsy Brother    Lupus Daughter    Gallbladder disease Daughter    Breast cancer Neg Hx     Current Medications[2]  Physical exam:  Vitals:   11/27/24 0941  BP: 111/76  Pulse: 97  Resp: 18  Temp: (!) 97.1 F (36.2 C)  TempSrc: Tympanic  SpO2: 100%  Weight: 179 lb 4.8 oz (81.3 kg)  Height: 5' (1.524 m)   Physical Exam Cardiovascular:     Rate and Rhythm: Normal rate and regular rhythm.     Heart sounds: Normal heart sounds.  Pulmonary:     Effort: Pulmonary effort is normal.     Breath sounds: Normal breath sounds.  Skin:    General: Skin is warm and dry.  Neurological:     Mental Status: She is alert and  oriented to person, place, and time.      I have personally reviewed labs listed below:    Latest Ref Rng & Units 10/13/2023    8:41 AM  CMP  Glucose 70 - 99 mg/dL 852   BUN 8 - 23 mg/dL 7  Creatinine 0.44 - 1.00 mg/dL 9.29   Sodium 864 - 854 mmol/L 139   Potassium 3.5 - 5.1 mmol/L 4.3   Chloride 98 - 111 mmol/L 100       Latest Ref Rng & Units 11/27/2024    9:09 AM  CBC  WBC 4.0 - 10.5 K/uL 8.4   Hemoglobin 12.0 - 15.0 g/dL 85.3   Hematocrit 63.9 - 46.0 % 44.5   Platelets 150 - 400 K/uL 311    Assessment and plan- Patient is a 64 y.o. female here for routine follow-up of iron  deficiency anemia  Patient is not presently anemic with an H&H of 14.6/44.5.  She last received IV iron  in July 2024.  Iron  studies from today are pending.  At this point she can continue to follow-up with her primary care doctor and get her iron  studies checked every 6 months.  She can be referred to us  in the future if questions or concerns arise    Visit Diagnosis 1. Iron  deficiency anemia, unspecified iron  deficiency anemia type      Dr. Annah Skene, MD, MPH [facility] [phone] 11/27/2024 9:19 AM                    [1]  Allergies Allergen Reactions   Iodinated Contrast Media Other (See Comments)    Becomes 'unresponsive' to ORAL and IV DYE BETADINE ON THE SKIN IS OKAY unresponsive Patient could hear things but not responsive Becomes 'unresponsive' to ORAL and IV DYE BETADINE ON THE SKIN IS OKAY  Patient could hear things but not responsive Becomes 'unresponsive' to ORAL and IV DYE BETADINE ON THE SKIN IS OKAY Becomes 'unresponsive' to ORAL and IV DYE BETADINE ON THE SKIN IS OKAY unresponsive Patient could hear things but not responsiveBecomes 'unresponsive' to ORAL and IV DYE BETADINE ON THE SKIN IS OKAY   Iodine     Other reaction(s): Other (See Comments) Becomes 'unresponsive' to ORAL and IV DYE BETADINE ON THE SKIN IS  OKAY unresponsive Patient could hear things but not responsive Becomes 'unresponsive' to ORAL and IV DYE BETADINE ON THE SKIN IS OKAY  Patient could hear things but not responsive Becomes 'unresponsive' to ORAL and IV DYE BETADINE ON THE SKIN IS OKAY Becomes 'unresponsive' to ORAL and IV DYE BETADINE ON THE SKIN IS OKAY unresponsive Patient could hear things but not responsiveBecomes 'unresponsive' to ORAL and IV DYE BETADINE ON THE SKIN IS OKAY   Diclofenac Hives    HORRIBLE RASH with both PATCH OR CREAM   Ibuprofen Itching   Zyrtec [Cetirizine] Other (See Comments)    HEADACHE    Cephalexin Rash   Maxalt [Rizatriptan Benzoate] Other (See Comments)    'Heart Races'   Orphenadrine Citrate Other (See Comments)    Patient unsure of this allergy.   Zithromax [Azithromycin] Other (See Comments)    Severe Abdominal Cramps   Zomig [Zolmitriptan] Other (See Comments)    'Heart Races'  [2]  Current Outpatient Medications:    acetaminophen  (TYLENOL ) 500 MG tablet, Take 1,000 mg by mouth every 6 (six) hours as needed., Disp: , Rfl:    albuterol  (PROVENTIL  HFA;VENTOLIN  HFA) 108 (90 Base) MCG/ACT inhaler, Inhale 2 puffs into the lungs every 6 (six) hours as needed for wheezing or shortness of breath., Disp: , Rfl:    aspirin EC 325 MG tablet, Take by mouth., Disp: , Rfl:    atenolol  (TENORMIN ) 50 MG tablet, Take 25 mg by mouth daily., Disp: ,  Rfl:    Atogepant (QULIPTA PO), Take by mouth daily., Disp: , Rfl:    carboxymethylcellulose (REFRESH PLUS) 0.5 % SOLN, Place 1 drop into both eyes 3 (three) times daily as needed., Disp: , Rfl:    cetirizine (ZYRTEC) 10 MG tablet, Take 10 mg by mouth as needed for allergies., Disp: , Rfl:    Cholecalciferol  2000 units CAPS, Take 2,000 Units by mouth daily., Disp: , Rfl:    cyclobenzaprine  (FLEXERIL ) 5 MG tablet, Take 5 mg by mouth 3 (three) times daily as needed for muscle spasms., Disp: , Rfl:    estrogens, conjugated, (PREMARIN) 0.625 MG tablet,  Take 0.625 mg by mouth 3 (three) times a week. Take daily for 21 days then do not take for 7 days., Disp: , Rfl:    fluticasone (FLONASE) 50 MCG/ACT nasal spray, Place 1 spray into both nostrils as needed for allergies or rhinitis., Disp: , Rfl:    gabapentin  (NEURONTIN ) 800 MG tablet, Take by mouth. 7 pm, Disp: , Rfl:    Magnesium  250 MG TABS, Take 1 tablet by mouth daily., Disp: , Rfl:    Menthol, Topical Analgesic, (BIOFREEZE ROLL-ON EX), Apply topically as needed., Disp: , Rfl:    Menthol, Topical Analgesic, 4 % GEL, Apply topically., Disp: , Rfl:    Multiple Vitamin (MULTI-VITAMIN) tablet, Take 1 tablet by mouth daily., Disp: , Rfl:    naratriptan (AMERGE) 2.5 MG tablet, Take 2.5 mg by mouth as needed for migraine. Take one (1) tablet at onset of headache; if returns or does not resolve, may repeat after 4 hours; do not exceed five (5) mg in 24 hours., Disp: , Rfl:    omeprazole (PRILOSEC) 40 MG capsule, Take 40 mg by mouth 2 (two) times daily., Disp: , Rfl:    ondansetron  (ZOFRAN ) 4 MG tablet, Take 1 tablet (4 mg total) by mouth every 6 (six) hours as needed for nausea., Disp: 28 tablet, Rfl: 0   OVER THE COUNTER MEDICATION, 1 tablet daily. Iron  Repair Plus - folate 50mcg- vit B12 - 25 mcg -iron  20 mg, Disp: , Rfl:    oxyCODONE  (ROXICODONE ) 5 MG immediate release tablet, Take 1-2 tablets (5-10 mg total) by mouth every 4 (four) hours as needed for moderate pain or severe pain., Disp: 40 tablet, Rfl: 0   OZEMPIC, 0.25 OR 0.5 MG/DOSE, 2 MG/3ML SOPN, Inject into the skin. Sunday, Disp: , Rfl:    PREVIDENT 5000 BOOSTER PLUS 1.1 % PSTE, Place onto teeth., Disp: , Rfl:    promethazine  (PHENERGAN ) 25 MG tablet, Take by mouth every 8 (eight) hours as needed., Disp: , Rfl:    simvastatin  (ZOCOR ) 10 MG tablet, Take 1 tablet by mouth at bedtime., Disp: , Rfl:    SUMAtriptan  (IMITREX ) 100 MG tablet, Take 0.5 tablets (50 mg total) by mouth every 2 (two) hours as needed for migraine., Disp: , Rfl:   "

## 2024-11-27 NOTE — Progress Notes (Signed)
 Patient doing good; no new or acute concerns at this time.

## 2025-05-27 ENCOUNTER — Inpatient Hospital Stay

## 2025-11-28 ENCOUNTER — Inpatient Hospital Stay

## 2025-11-28 ENCOUNTER — Inpatient Hospital Stay: Admitting: Oncology
